# Patient Record
Sex: Female | Born: 1940 | Race: White | Hispanic: No | Smoking: Former smoker
Health system: Southern US, Community
[De-identification: ages and names within clinical notes are randomized; demographics above are authoritative.]

## PROBLEM LIST (undated history)

## (undated) DIAGNOSIS — I4891 Unspecified atrial fibrillation: Secondary | ICD-10-CM

## (undated) DIAGNOSIS — I10 Essential (primary) hypertension: Secondary | ICD-10-CM

## (undated) DIAGNOSIS — E039 Hypothyroidism, unspecified: Secondary | ICD-10-CM

## (undated) DIAGNOSIS — E079 Disorder of thyroid, unspecified: Secondary | ICD-10-CM

## (undated) DIAGNOSIS — I509 Heart failure, unspecified: Secondary | ICD-10-CM

## (undated) DIAGNOSIS — J449 Chronic obstructive pulmonary disease, unspecified: Secondary | ICD-10-CM

## (undated) DIAGNOSIS — K219 Gastro-esophageal reflux disease without esophagitis: Secondary | ICD-10-CM

## (undated) HISTORY — PX: BREAST CYST ASPIRATION: SHX578

---

## 2004-07-12 ENCOUNTER — Encounter: Admission: RE | Admit: 2004-07-12 | Discharge: 2004-07-12 | Payer: Self-pay | Admitting: Unknown Physician Specialty

## 2005-11-08 ENCOUNTER — Other Ambulatory Visit: Payer: Self-pay

## 2005-11-15 ENCOUNTER — Ambulatory Visit: Payer: Self-pay | Admitting: Unknown Physician Specialty

## 2006-10-08 ENCOUNTER — Ambulatory Visit: Payer: Self-pay | Admitting: Family Medicine

## 2008-09-14 ENCOUNTER — Ambulatory Visit: Payer: Self-pay | Admitting: Family Medicine

## 2009-09-18 ENCOUNTER — Ambulatory Visit: Payer: Self-pay | Admitting: Family Medicine

## 2010-10-02 ENCOUNTER — Ambulatory Visit: Payer: Self-pay | Admitting: Family Medicine

## 2011-10-31 ENCOUNTER — Ambulatory Visit: Payer: Self-pay | Admitting: Family Medicine

## 2012-04-15 ENCOUNTER — Ambulatory Visit: Payer: Self-pay | Admitting: Family Medicine

## 2012-05-07 ENCOUNTER — Ambulatory Visit: Payer: Self-pay | Admitting: Surgery

## 2012-05-14 ENCOUNTER — Ambulatory Visit: Payer: Self-pay | Admitting: Surgery

## 2012-05-15 LAB — PATHOLOGY REPORT

## 2012-11-12 ENCOUNTER — Emergency Department: Payer: Self-pay | Admitting: Internal Medicine

## 2012-11-12 LAB — CBC
MCH: 33.5 pg (ref 26.0–34.0)
MCHC: 35 g/dL (ref 32.0–36.0)
MCV: 96 fL (ref 80–100)
Platelet: 362 10*3/uL (ref 150–440)
RBC: 4.31 10*6/uL (ref 3.80–5.20)
RDW: 13.6 % (ref 11.5–14.5)

## 2012-11-12 LAB — COMPREHENSIVE METABOLIC PANEL
Alkaline Phosphatase: 96 U/L (ref 50–136)
Anion Gap: 5 — ABNORMAL LOW (ref 7–16)
Calcium, Total: 9.8 mg/dL (ref 8.5–10.1)
Co2: 31 mmol/L (ref 21–32)
EGFR (Non-African Amer.): >60
Osmolality: 269 (ref 275–301)
Sodium: 135 mmol/L — ABNORMAL LOW (ref 136–145)

## 2012-11-12 LAB — URINALYSIS, COMPLETE
Bilirubin,UR: NEGATIVE
Glucose,UR: NEGATIVE mg/dL (ref 0–75)
RBC,UR: 2 /HPF (ref 0–5)
Squamous Epithelial: 3
WBC UR: 3 /HPF (ref 0–5)

## 2012-11-12 LAB — LIPASE, BLOOD: Lipase: 131 U/L (ref 73–393)

## 2013-02-02 ENCOUNTER — Ambulatory Visit: Payer: Self-pay | Admitting: Family Medicine

## 2013-05-24 ENCOUNTER — Ambulatory Visit: Payer: Self-pay | Admitting: Gastroenterology

## 2013-07-07 ENCOUNTER — Ambulatory Visit: Payer: Self-pay | Admitting: Family Medicine

## 2013-07-09 ENCOUNTER — Ambulatory Visit: Payer: Self-pay | Admitting: Gastroenterology

## 2013-07-20 ENCOUNTER — Ambulatory Visit: Payer: Self-pay | Admitting: Gastroenterology

## 2013-07-22 LAB — PATHOLOGY REPORT

## 2014-04-20 ENCOUNTER — Ambulatory Visit: Payer: Self-pay | Admitting: Family Medicine

## 2014-06-15 IMAGING — US ABDOMEN ULTRASOUND
1 series · 14 of 25 positions shown · non-contrast
Comparison: none

REASON FOR EXAM: Epigastric abd pain Dyspepsia
COMMENTS:

[Series 1: abdomen ultrasound · 0.28mm/px · 14 of 106 slices shown]
[im 1/106]
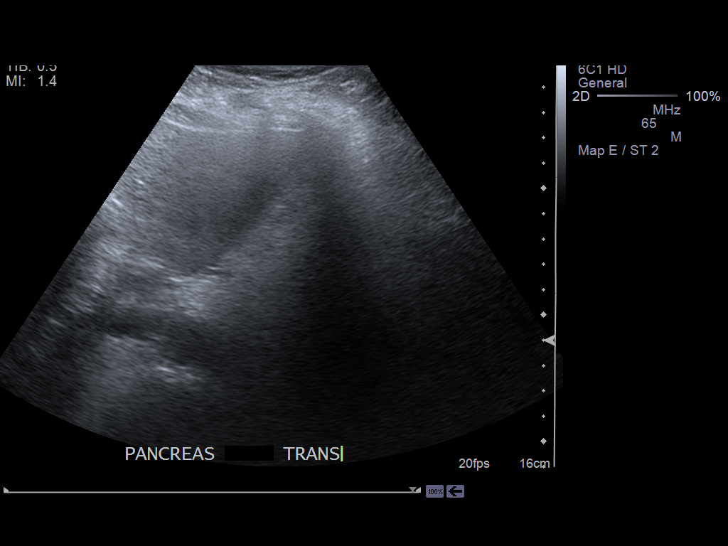
[im 9/106]
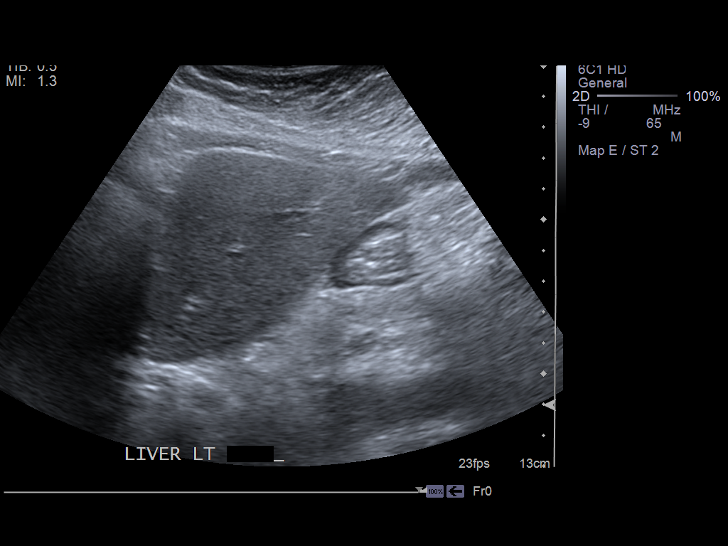
[im 18/106]
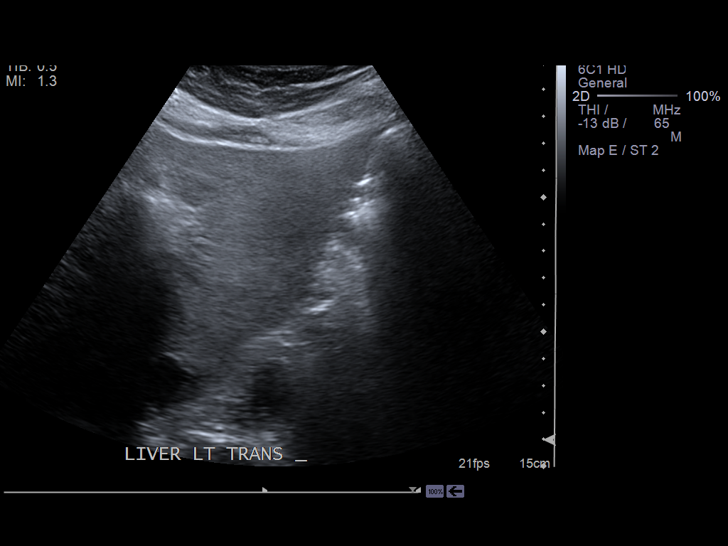
[im 27/106]
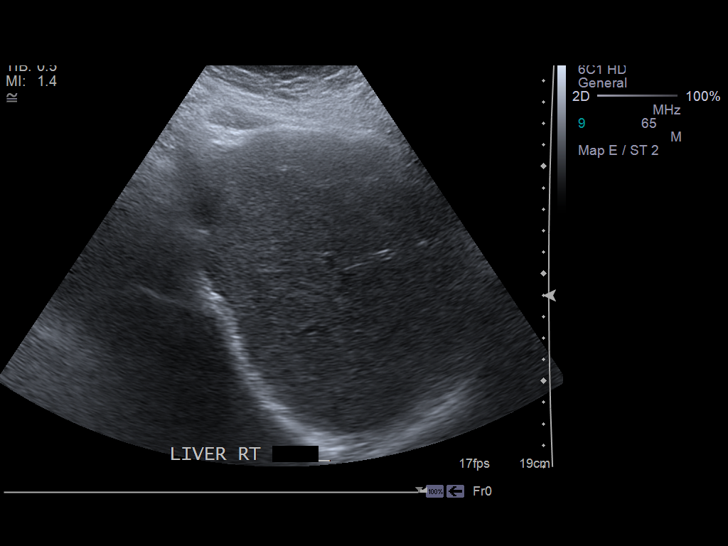
[im 36/106]
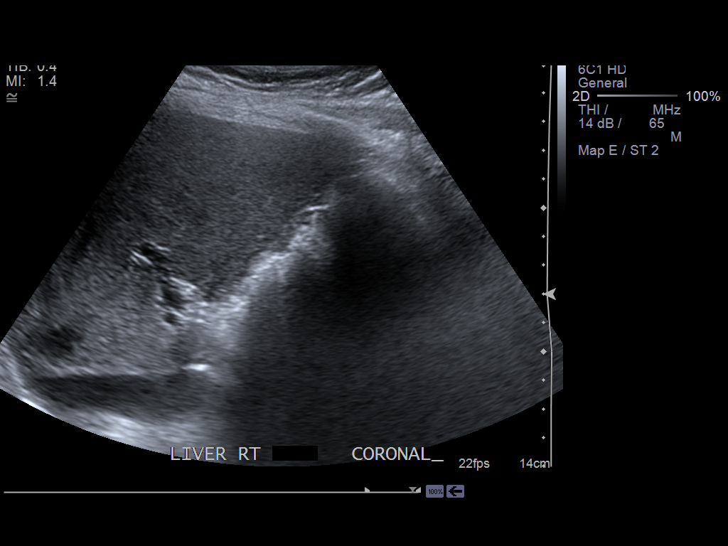
[im 40/106]
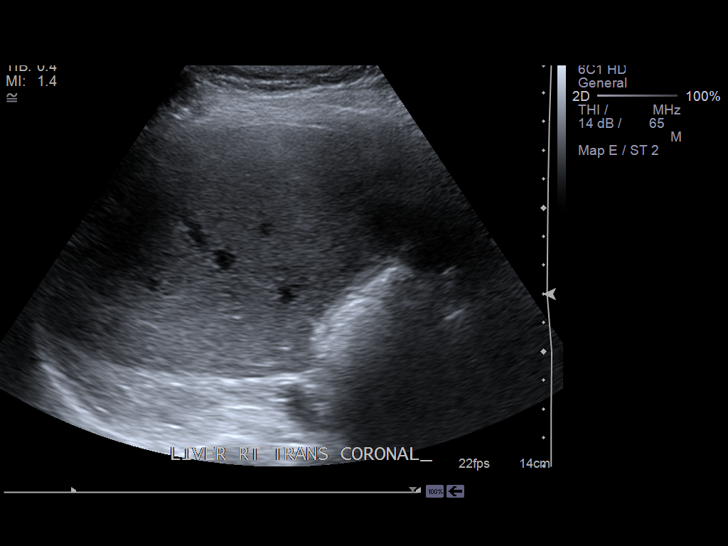
[im 49/106]
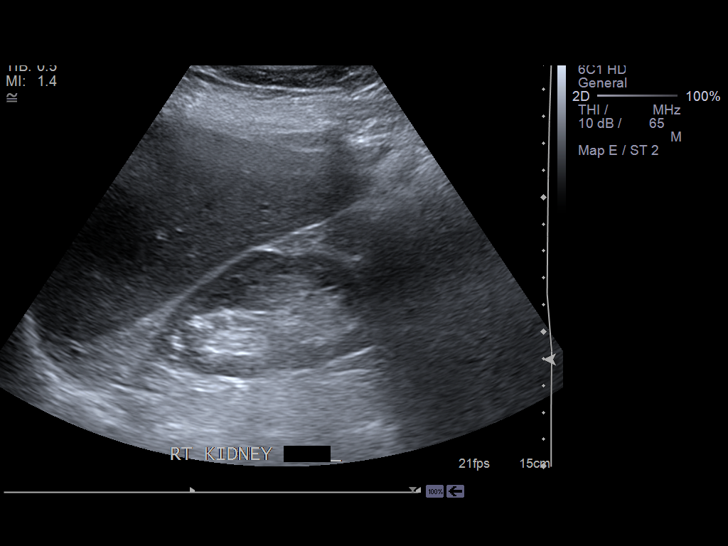
[im 57/106]
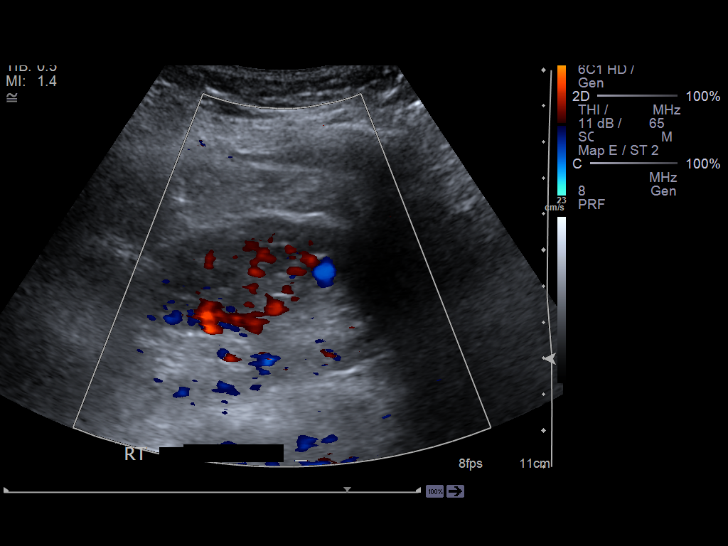
[im 66/106]
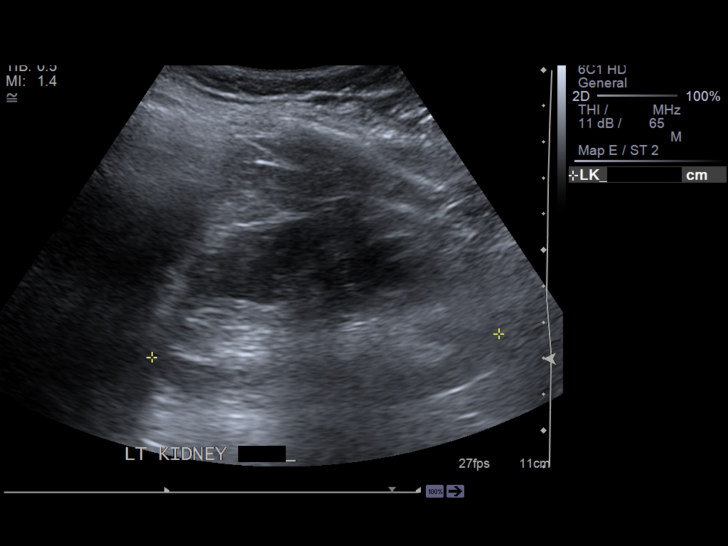
[im 71/106]
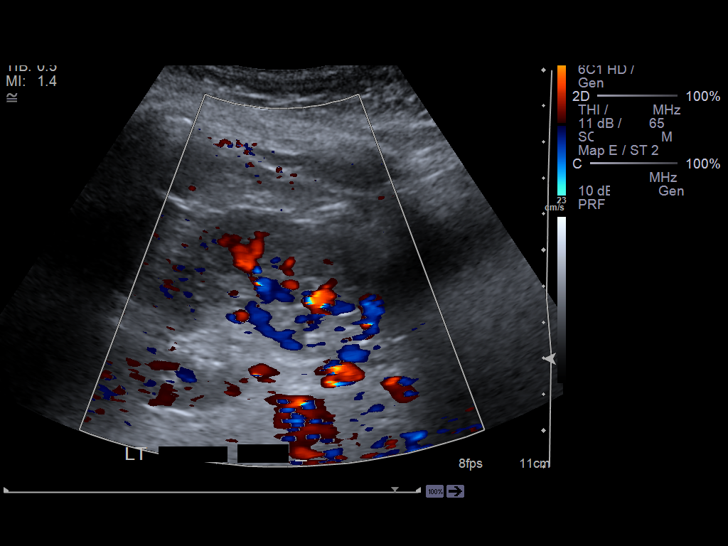
[im 79/106]
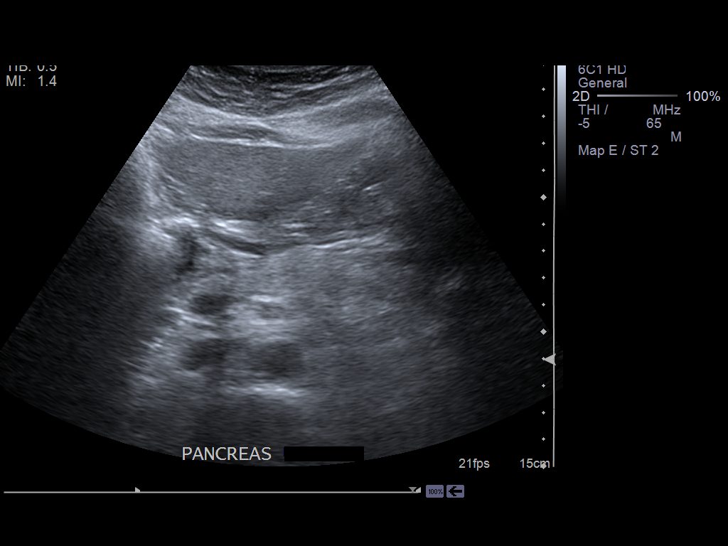
[im 88/106]
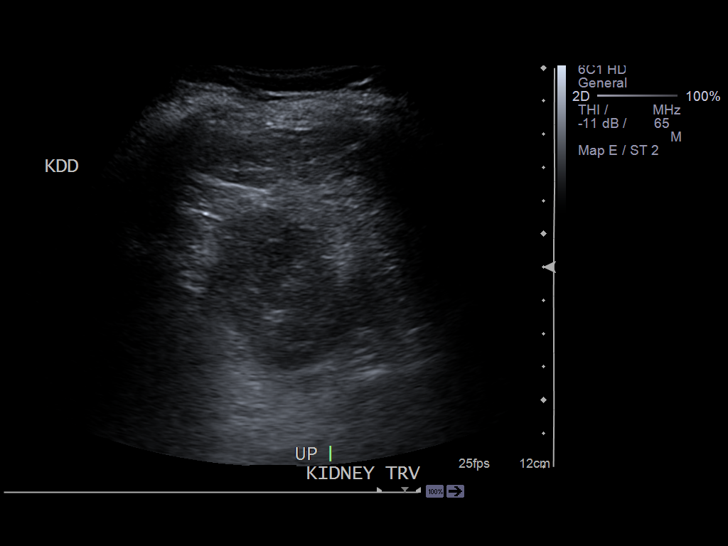
[im 97/106]
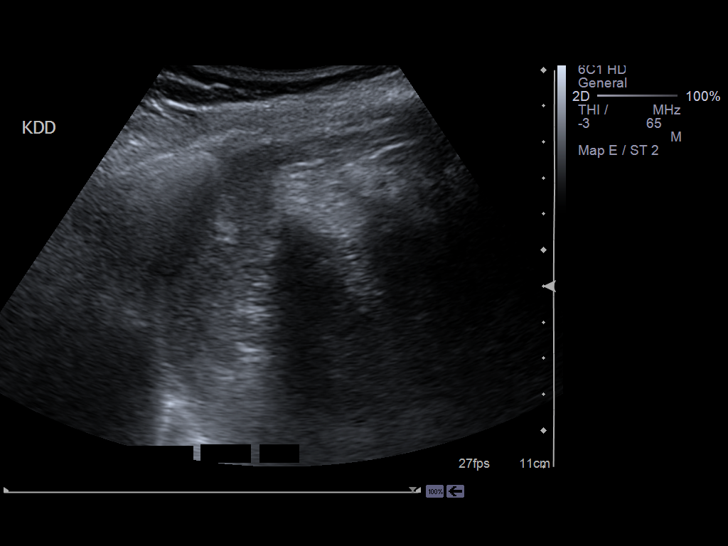
[im 106/106]
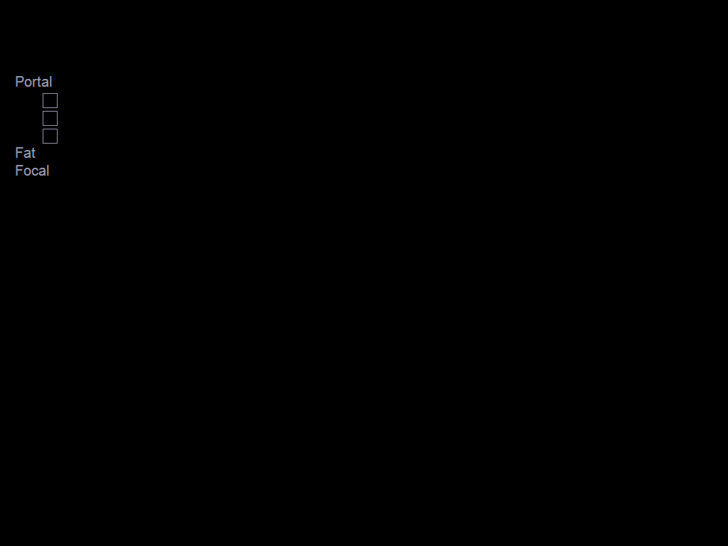

[14 of 25 positions shown; findings below may reference images not displayed]

PROCEDURE:     US  - US ABDOMEN GENERAL SURVEY  - July 09, 2013 [DATE]

RESULT:     The pancreas is poorly visualized because of overlying bowel
gas. The patient is status post cholecystectomy. There is limited
visualization of the pancreatic body which appears to be grossly normal. The
hepatic echotexture appears to be increased and slightly coarse consistent
with hepatic steatosis. The abdominal aorta tapers distally without evidence
of aneurysm. The liver length is 13.66 cm. No intrahepatic biliary ductal
dilation is evident. The common bile duct diameter is 5.3 mm. And portal
venous flow is normal. Spleen measures 7.36 cm with normal echotexture. The
right kidney measures 8.46 x 4.27 x 4.83 cm. The left kidney measures 9.66 x
4.71 x 4.60 cm. Neither kidney shows evidence of obstruction or solid or
cystic mass. No stones are evident.
IMPRESSION: 1. Status post cholecystectomy. Poor visualization of the pancreas. No
evidence of abdominal aortic aneurysm. Hepatic steatosis appears present.

[REDACTED]

## 2014-09-01 ENCOUNTER — Emergency Department: Payer: Self-pay | Admitting: Emergency Medicine

## 2014-09-01 LAB — DIFFERENTIAL
BASOS PCT: 1.2 %
Basophil #: 0.1 10*3/uL (ref 0.0–0.1)
EOS ABS: 0.1 10*3/uL (ref 0.0–0.7)
Eosinophil %: 0.9 %
LYMPHS ABS: 2.8 10*3/uL (ref 1.0–3.6)
LYMPHS PCT: 32.8 %
MONOS PCT: 7.7 %
Monocyte #: 0.7 x10 3/mm (ref 0.2–0.9)
NEUTROS PCT: 57.4 %
Neutrophil #: 4.9 10*3/uL (ref 1.4–6.5)

## 2014-09-01 LAB — BASIC METABOLIC PANEL
Anion Gap: 7 (ref 7–16)
BUN: 9 mg/dL (ref 7–18)
CALCIUM: 9.1 mg/dL (ref 8.5–10.1)
CHLORIDE: 99 mmol/L (ref 98–107)
CO2: 29 mmol/L (ref 21–32)
CREATININE: 0.91 mg/dL (ref 0.60–1.30)
GLUCOSE: 112 mg/dL — AB (ref 65–99)
OSMOLALITY: 270 (ref 275–301)
POTASSIUM: 3.2 mmol/L — AB (ref 3.5–5.1)
SODIUM: 135 mmol/L — AB (ref 136–145)

## 2014-09-01 LAB — CBC
HCT: 41.3 % (ref 35.0–47.0)
HGB: 13.3 g/dL (ref 12.0–16.0)
MCH: 31.7 pg (ref 26.0–34.0)
MCHC: 32.2 g/dL (ref 32.0–36.0)
MCV: 99 fL (ref 80–100)
Platelet: 476 10*3/uL — ABNORMAL HIGH (ref 150–440)
RBC: 4.19 10*6/uL (ref 3.80–5.20)
RDW: 14.1 % (ref 11.5–14.5)
WBC: 8.3 10*3/uL (ref 3.6–11.0)

## 2014-09-01 LAB — HEPATIC FUNCTION PANEL A (ARMC)
ALT: 20 U/L
AST: 26 U/L (ref 15–37)
Albumin: 4 g/dL (ref 3.4–5.0)
Alkaline Phosphatase: 88 U/L
BILIRUBIN TOTAL: 0.3 mg/dL (ref 0.2–1.0)
Total Protein: 8.1 g/dL (ref 6.4–8.2)

## 2014-09-01 LAB — LIPASE, BLOOD: LIPASE: 129 U/L (ref 73–393)

## 2014-09-01 LAB — TROPONIN I

## 2015-03-26 NOTE — Op Note (Signed)
PATIENT NAME:  Linda Brady, [name] MR#:  [id number] DATE OF BIRTH:  November 01, 1941  DATE OF PROCEDURE:  05/14/2012  PREOPERATIVE DIAGNOSIS: Chronic cholecystitis, cholelithiasis.   POSTOPERATIVE DIAGNOSIS: Cholecystitis, cholelithiasis.   PROCEDURE: Laparoscopic cholecystectomy, cholangiogram.   SURGEON: Renda RollsWilton Janne Faulk, M.D.   ANESTHESIA: General.   INDICATIONS: This 74 year old female has a history of epigastric pains and ultrasound findings of gallstones.   DESCRIPTION OF PROCEDURE:  The patient was placed on the operating table in the supine position under general endotracheal anesthesia. The abdomen was prepared with ChloraPrep and draped in a sterile manner. A short incision was made in the inferior aspect of the umbilicus and carried down to the deep fascia which was grasped with a laryngeal hook and elevated. A Veress needle was inserted, aspirated, and irrigated with a saline solution. Next, the peritoneal cavity was inflated with carbon dioxide. The Veress needle was removed. The 10 mm cannula was inserted. The 10 mm, 0 degree laparoscope was inserted to view the peritoneal cavity. Another incision was made in the epigastrium just to the right of the midline to introduce an 11 mm cannula. Two incisions were made in the lateral aspect of the right upper quadrant to introduce two 5-mm cannulas. The liver appeared normal. Gallbladder appeared to have a slightly thickened wall. There were some adhesions between the liver and the anterior abdominal wall which were taken down with sharp dissection. The gallbladder was retracted towards the right shoulder. The pouch of Jon BillingsMorrison was retracted inferiorly and laterally. The common bile duct was demonstrated. The neck of the gallbladder was mobilized with incision of the visceral peritoneum. The cystic artery was dissected free from surrounding structures. The cystic duct was dissected free from surrounding structures. A critical view of safety was  demonstrated. An Endo Clip was placed across the cystic duct adjacent to the neck of the gallbladder. An incision was made in the cystic duct to introduce a Reddick catheter. Half-strength Conray-60 dye was injected as the cholangiogram was done with fluoroscopy viewing the biliary tree and flow of dye into the duodenum. No retained stones were seen. Two images were saved. The Reddick catheter was removed. The cystic duct was doubly ligated with Endoclips and divided. The cystic artery was controlled with double Endoclips and divided. One other small branch of the cystic artery was controlled with a single Endoclip and divided. The gallbladder was dissected free from the liver with hook and cautery. The gallbladder was delivered up through the infraumbilical incision, opened and suctioned. Multiple stones were removed in a piecemeal fashion and the gallbladder was removed and submitted with stones in formalin for routine pathology. The right upper quadrant was further inspected. Hemostasis was intact. The cannulas were removed. Carbon dioxide was allowed to escape from the peritoneal cavity. Skin incisions were closed with interrupted 5-0 chromic subcuticular sutures, benzoin, and Steri-Strips. Dressings were applied with paper tape. The patient tolerated surgery satisfactorily and was then prepared for transfer to the recovery room.  ____________________________ Shela CommonsJ. Renda RollsWilton Nashaly Dorantes, MD jws:ap D: 05/14/2012 10:52:49 ET T: 05/14/2012 12:09:11 ET JOB#: [id number]  cc: Adella HareJ. Wilton Niccolo Burggraf, MD, <Dictator> Adella HareWILTON J Azariya Freeman MD ELECTRONICALLY SIGNED 05/15/2012 8:55

## 2015-05-23 ENCOUNTER — Other Ambulatory Visit: Payer: Self-pay | Admitting: Family Medicine

## 2015-05-23 DIAGNOSIS — Z1231 Encounter for screening mammogram for malignant neoplasm of breast: Secondary | ICD-10-CM

## 2015-05-31 ENCOUNTER — Ambulatory Visit: Payer: Self-pay | Attending: Family Medicine

## 2016-06-12 ENCOUNTER — Other Ambulatory Visit: Payer: Self-pay | Admitting: Family Medicine

## 2016-06-12 ENCOUNTER — Ambulatory Visit
Admission: RE | Admit: 2016-06-12 | Discharge: 2016-06-12 | Disposition: A | Discharge status: Home / Self Care | Payer: Medicare Other | Source: Ambulatory Visit | Attending: Family Medicine | Admitting: Family Medicine

## 2016-06-12 DIAGNOSIS — Z1231 Encounter for screening mammogram for malignant neoplasm of breast: Secondary | ICD-10-CM | POA: Diagnosis not present

## 2017-05-29 ENCOUNTER — Other Ambulatory Visit: Payer: Self-pay | Admitting: Unknown Physician Specialty

## 2017-05-29 DIAGNOSIS — R42 Dizziness and giddiness: Secondary | ICD-10-CM

## 2017-06-05 ENCOUNTER — Ambulatory Visit
Admission: RE | Admit: 2017-06-05 | Discharge: 2017-06-05 | Disposition: A | Discharge status: Home / Self Care | Payer: Medicare Other | Source: Ambulatory Visit | Attending: Unknown Physician Specialty | Admitting: Unknown Physician Specialty

## 2017-06-05 ENCOUNTER — Encounter: Payer: Self-pay | Admitting: Radiology

## 2017-06-05 DIAGNOSIS — R42 Dizziness and giddiness: Secondary | ICD-10-CM

## 2017-06-05 DIAGNOSIS — I6782 Cerebral ischemia: Secondary | ICD-10-CM | POA: Diagnosis not present

## 2017-06-05 LAB — POCT I-STAT CREATININE: Creatinine, Ser: 0.8 mg/dL (ref 0.44–1.00)

## 2017-06-05 MED ORDER — GADOBENATE DIMEGLUMINE 529 MG/ML IV SOLN
15.0000 mL | Freq: Once | INTRAVENOUS | Status: AC | PRN
  Administered 2017-06-05: 14 mL via INTRAVENOUS

## 2017-10-09 ENCOUNTER — Other Ambulatory Visit: Payer: Self-pay | Admitting: Family Medicine

## 2017-10-09 DIAGNOSIS — Z1231 Encounter for screening mammogram for malignant neoplasm of breast: Secondary | ICD-10-CM

## 2017-11-04 ENCOUNTER — Ambulatory Visit
Admission: RE | Admit: 2017-11-04 | Discharge: 2017-11-04 | Disposition: A | Discharge status: Home / Self Care | Payer: Medicare Other | Source: Ambulatory Visit | Attending: Family Medicine | Admitting: Family Medicine

## 2017-11-04 DIAGNOSIS — Z1231 Encounter for screening mammogram for malignant neoplasm of breast: Secondary | ICD-10-CM | POA: Diagnosis not present

## 2018-10-22 ENCOUNTER — Other Ambulatory Visit: Payer: Self-pay | Admitting: Family Medicine

## 2018-10-22 DIAGNOSIS — Z1231 Encounter for screening mammogram for malignant neoplasm of breast: Secondary | ICD-10-CM

## 2018-10-27 ENCOUNTER — Other Ambulatory Visit: Payer: Self-pay | Admitting: Family Medicine

## 2018-10-27 DIAGNOSIS — R109 Unspecified abdominal pain: Secondary | ICD-10-CM

## 2018-10-27 DIAGNOSIS — Z0000 Encounter for general adult medical examination without abnormal findings: Secondary | ICD-10-CM

## 2018-10-30 ENCOUNTER — Ambulatory Visit
Admission: RE | Admit: 2018-10-30 | Discharge: 2018-10-30 | Disposition: A | Discharge status: Home / Self Care | Payer: Medicare Other | Source: Ambulatory Visit | Attending: Family Medicine | Admitting: Family Medicine

## 2018-10-30 DIAGNOSIS — Z9049 Acquired absence of other specified parts of digestive tract: Secondary | ICD-10-CM | POA: Insufficient documentation

## 2018-10-30 DIAGNOSIS — Z0000 Encounter for general adult medical examination without abnormal findings: Secondary | ICD-10-CM | POA: Insufficient documentation

## 2018-10-30 DIAGNOSIS — R109 Unspecified abdominal pain: Secondary | ICD-10-CM | POA: Insufficient documentation

## 2018-10-30 DIAGNOSIS — K760 Fatty (change of) liver, not elsewhere classified: Secondary | ICD-10-CM | POA: Diagnosis not present

## 2018-11-17 ENCOUNTER — Ambulatory Visit
Admission: RE | Admit: 2018-11-17 | Discharge: 2018-11-17 | Disposition: A | Discharge status: Home / Self Care | Payer: Medicare Other | Source: Ambulatory Visit | Attending: Family Medicine | Admitting: Family Medicine

## 2018-11-17 DIAGNOSIS — Z1231 Encounter for screening mammogram for malignant neoplasm of breast: Secondary | ICD-10-CM | POA: Diagnosis not present

## 2020-03-20 ENCOUNTER — Other Ambulatory Visit: Payer: Self-pay | Admitting: Family Medicine

## 2020-03-20 DIAGNOSIS — Z1231 Encounter for screening mammogram for malignant neoplasm of breast: Secondary | ICD-10-CM

## 2020-03-27 ENCOUNTER — Encounter

## 2020-08-02 ENCOUNTER — Other Ambulatory Visit: Admission: RE | Admit: 2020-08-02 | Source: Ambulatory Visit

## 2020-08-04 ENCOUNTER — Encounter: Admission: RE | Payer: Self-pay | Source: Home / Self Care

## 2020-08-04 ENCOUNTER — Ambulatory Visit: Admission: RE | Admit: 2020-08-04 | Payer: Medicare Other | Source: Home / Self Care | Admitting: General Surgery

## 2020-08-04 SURGERY — COLONOSCOPY WITH PROPOFOL
Anesthesia: General

## 2020-09-10 ENCOUNTER — Emergency Department: Payer: Medicare Other

## 2020-09-10 ENCOUNTER — Observation Stay
Admission: EM | Admit: 2020-09-10 | Discharge: 2020-09-11 | Disposition: A | Discharge status: Home / Self Care | Payer: Medicare Other | Attending: Internal Medicine | Admitting: Internal Medicine

## 2020-09-10 ENCOUNTER — Encounter: Payer: Self-pay | Admitting: Emergency Medicine

## 2020-09-10 ENCOUNTER — Other Ambulatory Visit: Payer: Self-pay

## 2020-09-10 DIAGNOSIS — I200 Unstable angina: Secondary | ICD-10-CM | POA: Diagnosis not present

## 2020-09-10 DIAGNOSIS — I10 Essential (primary) hypertension: Secondary | ICD-10-CM | POA: Diagnosis present

## 2020-09-10 DIAGNOSIS — Z7982 Long term (current) use of aspirin: Secondary | ICD-10-CM | POA: Insufficient documentation

## 2020-09-10 DIAGNOSIS — R0789 Other chest pain: Secondary | ICD-10-CM | POA: Diagnosis present

## 2020-09-10 DIAGNOSIS — R079 Chest pain, unspecified: Secondary | ICD-10-CM

## 2020-09-10 DIAGNOSIS — Z79899 Other long term (current) drug therapy: Secondary | ICD-10-CM | POA: Diagnosis not present

## 2020-09-10 DIAGNOSIS — J449 Chronic obstructive pulmonary disease, unspecified: Secondary | ICD-10-CM | POA: Diagnosis not present

## 2020-09-10 DIAGNOSIS — E079 Disorder of thyroid, unspecified: Secondary | ICD-10-CM | POA: Diagnosis present

## 2020-09-10 DIAGNOSIS — J439 Emphysema, unspecified: Secondary | ICD-10-CM | POA: Diagnosis not present

## 2020-09-10 DIAGNOSIS — E039 Hypothyroidism, unspecified: Secondary | ICD-10-CM | POA: Diagnosis present

## 2020-09-10 DIAGNOSIS — Z20822 Contact with and (suspected) exposure to covid-19: Secondary | ICD-10-CM | POA: Diagnosis not present

## 2020-09-10 DIAGNOSIS — E876 Hypokalemia: Secondary | ICD-10-CM | POA: Diagnosis present

## 2020-09-10 HISTORY — DX: Essential (primary) hypertension: I10

## 2020-09-10 HISTORY — DX: Chronic obstructive pulmonary disease, unspecified: J44.9

## 2020-09-10 HISTORY — DX: Disorder of thyroid, unspecified: E07.9

## 2020-09-10 LAB — LIPID PANEL
Cholesterol: 158 mg/dL (ref 0–200)
HDL: 53 mg/dL (ref >40)
LDL Cholesterol: 85 mg/dL (ref 0–99)
Total CHOL/HDL Ratio: 3 RATIO
Triglycerides: 100 mg/dL (ref <150)
VLDL: 20 mg/dL (ref 0–40)

## 2020-09-10 LAB — CBC
HCT: 40.5 % (ref 36.0–46.0)
Hemoglobin: 13.7 g/dL (ref 12.0–15.0)
MCH: 31.8 pg (ref 26.0–34.0)
MCHC: 33.8 g/dL (ref 30.0–36.0)
MCV: 94 fL (ref 80.0–100.0)
Platelets: 422 10*3/uL — ABNORMAL HIGH (ref 150–400)
RBC: 4.31 MIL/uL (ref 3.87–5.11)
RDW: 12.7 % (ref 11.5–15.5)
WBC: 6 10*3/uL (ref 4.0–10.5)
nRBC: 0 % (ref 0.0–0.2)

## 2020-09-10 LAB — HEPATIC FUNCTION PANEL
ALT: 11 U/L (ref 0–44)
AST: 18 U/L (ref 15–41)
Albumin: 3.8 g/dL (ref 3.5–5.0)
Alkaline Phosphatase: 68 U/L (ref 38–126)
Bilirubin, Direct: <0.1 mg/dL (ref 0.0–0.2)
Total Bilirubin: 0.6 mg/dL (ref 0.3–1.2)
Total Protein: 6.8 g/dL (ref 6.5–8.1)

## 2020-09-10 LAB — PROTIME-INR
INR: 0.9 (ref 0.8–1.2)
Prothrombin Time: 11.8 seconds (ref 11.4–15.2)

## 2020-09-10 LAB — BASIC METABOLIC PANEL
Anion gap: 13 (ref 5–15)
BUN: 8 mg/dL (ref 8–23)
CO2: 26 mmol/L (ref 22–32)
Calcium: 9.6 mg/dL (ref 8.9–10.3)
Chloride: 93 mmol/L — ABNORMAL LOW (ref 98–111)
Creatinine, Ser: 0.82 mg/dL (ref 0.44–1.00)
GFR, Estimated: >60 mL/min (ref >60)
Glucose, Bld: 148 mg/dL — ABNORMAL HIGH (ref 70–99)
Potassium: 3.3 mmol/L — ABNORMAL LOW (ref 3.5–5.1)
Sodium: 132 mmol/L — ABNORMAL LOW (ref 135–145)

## 2020-09-10 LAB — TROPONIN I (HIGH SENSITIVITY)
Troponin I (High Sensitivity): 9 ng/L (ref <18)
Troponin I (High Sensitivity): 37 ng/L — ABNORMAL HIGH (ref <18)
Troponin I (High Sensitivity): 37 ng/L — ABNORMAL HIGH (ref <18)
Troponin I (High Sensitivity): 43 ng/L — ABNORMAL HIGH (ref <18)

## 2020-09-10 LAB — T4, FREE: Free T4: 1.15 ng/dL — ABNORMAL HIGH (ref 0.61–1.12)

## 2020-09-10 LAB — RESP PANEL BY RT PCR (RSV, FLU A&B, COVID)
Influenza A by PCR: NEGATIVE
Influenza B by PCR: NEGATIVE
Respiratory Syncytial Virus by PCR: NEGATIVE
SARS Coronavirus 2 by RT PCR: NEGATIVE

## 2020-09-10 LAB — APTT: aPTT: 30 seconds (ref 24–36)

## 2020-09-10 LAB — TSH: TSH: 1.72 u[IU]/mL (ref 0.350–4.500)

## 2020-09-10 MED ORDER — POTASSIUM CHLORIDE CRYS ER 20 MEQ PO TBCR
40.0000 meq | EXTENDED_RELEASE_TABLET | Freq: Once | ORAL | Status: DC
  Filled 2020-09-10: qty 2

## 2020-09-10 MED ORDER — POTASSIUM CHLORIDE 20 MEQ PO PACK
20.0000 meq | PACK | Freq: Once | ORAL | Status: AC
  Administered 2020-09-10: 20 meq via ORAL
  Filled 2020-09-10: qty 1

## 2020-09-10 MED ORDER — HEPARIN (PORCINE) 25000 UT/250ML-% IV SOLN
1100.0000 [IU]/h | INTRAVENOUS | Status: DC
  Administered 2020-09-10: 850 [IU]/h via INTRAVENOUS
  Filled 2020-09-10: qty 250

## 2020-09-10 MED ORDER — ENOXAPARIN SODIUM 40 MG/0.4ML ~~LOC~~ SOLN: 40.0000 mg | SUBCUTANEOUS | Status: DC

## 2020-09-10 MED ORDER — IPRATROPIUM-ALBUTEROL 0.5-2.5 (3) MG/3ML IN SOLN: 3.0000 mL | Freq: Four times a day (QID) | RESPIRATORY_TRACT | Status: DC | PRN

## 2020-09-10 MED ORDER — POTASSIUM CHLORIDE 20 MEQ PO PACK: 20.0000 meq | PACK | Freq: Two times a day (BID) | ORAL | Status: DC

## 2020-09-10 MED ORDER — NITROGLYCERIN 2 % TD OINT
0.5000 [in_us] | TOPICAL_OINTMENT | Freq: Four times a day (QID) | TRANSDERMAL | Status: DC
  Administered 2020-09-10 – 2020-09-11 (×2): 0.5 [in_us] via TOPICAL
  Filled 2020-09-10 (×2): qty 1

## 2020-09-10 MED ORDER — HEPARIN BOLUS VIA INFUSION
4000.0000 [IU] | Freq: Once | INTRAVENOUS | Status: AC
  Administered 2020-09-10: 4000 [IU] via INTRAVENOUS
  Filled 2020-09-10: qty 4000

## 2020-09-10 MED ORDER — IPRATROPIUM-ALBUTEROL 0.5-2.5 (3) MG/3ML IN SOLN
3.0000 mL | Freq: Once | RESPIRATORY_TRACT | Status: AC
  Administered 2020-09-10: 3 mL via RESPIRATORY_TRACT
  Filled 2020-09-10: qty 3

## 2020-09-10 MED ORDER — IOHEXOL 350 MG/ML SOLN
75.0000 mL | Freq: Once | INTRAVENOUS | Status: AC | PRN
  Administered 2020-09-10: 75 mL via INTRAVENOUS

## 2020-09-10 MED ORDER — CLOPIDOGREL BISULFATE 75 MG PO TABS
75.0000 mg | ORAL_TABLET | Freq: Every day | ORAL | Status: DC
  Administered 2020-09-10: 75 mg via ORAL
  Filled 2020-09-10: qty 1

## 2020-09-10 MED ORDER — ACETAMINOPHEN 325 MG PO TABS
650.0000 mg | ORAL_TABLET | ORAL | Status: DC | PRN
  Administered 2020-09-10 (×2): 650 mg via ORAL
  Filled 2020-09-10 (×2): qty 2

## 2020-09-10 MED ORDER — SODIUM CHLORIDE 0.9% FLUSH: 3.0000 mL | Freq: Two times a day (BID) | INTRAVENOUS | Status: DC

## 2020-09-10 MED ORDER — MOMETASONE FURO-FORMOTEROL FUM 100-5 MCG/ACT IN AERO
2.0000 | INHALATION_SPRAY | Freq: Two times a day (BID) | RESPIRATORY_TRACT | Status: DC
  Administered 2020-09-10 – 2020-09-11 (×2): 2 via RESPIRATORY_TRACT
  Filled 2020-09-10: qty 8.8

## 2020-09-10 MED ORDER — ATORVASTATIN CALCIUM 20 MG PO TABS
40.0000 mg | ORAL_TABLET | Freq: Every day | ORAL | Status: DC
  Administered 2020-09-10: 40 mg via ORAL
  Filled 2020-09-10: qty 2

## 2020-09-10 MED ORDER — ONDANSETRON HCL 4 MG/2ML IJ SOLN
4.0000 mg | Freq: Four times a day (QID) | INTRAMUSCULAR | Status: DC | PRN
  Administered 2020-09-11: 4 mg via INTRAVENOUS
  Filled 2020-09-10: qty 2

## 2020-09-10 MED ORDER — METOPROLOL TARTRATE 25 MG PO TABS
25.0000 mg | ORAL_TABLET | Freq: Two times a day (BID) | ORAL | Status: DC
  Administered 2020-09-10: 25 mg via ORAL
  Filled 2020-09-10: qty 1

## 2020-09-10 MED ORDER — TIOTROPIUM BROMIDE MONOHYDRATE 18 MCG IN CAPS: 1.0000 | ORAL_CAPSULE | Freq: Every day | RESPIRATORY_TRACT | Status: DC

## 2020-09-10 MED ORDER — FENTANYL CITRATE (PF) 100 MCG/2ML IJ SOLN
25.0000 ug | Freq: Once | INTRAMUSCULAR | Status: AC
  Administered 2020-09-10: 25 ug via INTRAVENOUS
  Filled 2020-09-10: qty 2

## 2020-09-10 MED ORDER — DICYCLOMINE HCL 10 MG PO CAPS
10.0000 mg | ORAL_CAPSULE | Freq: Three times a day (TID) | ORAL | Status: DC
  Administered 2020-09-10 (×2): 10 mg via ORAL
  Filled 2020-09-10 (×2): qty 1

## 2020-09-10 NOTE — ED Notes (Signed)
EDP at bedside  

## 2020-09-10 NOTE — ED Notes (Addendum)
Error: charted on wrong pt 

## 2020-09-10 NOTE — ED Notes (Addendum)
Pt c/o SOB. Takes Spiriva at home. SPO2 96% on RA. Wheezing noted all lung fields. HOB moved to almost 90 degrees.

## 2020-09-10 NOTE — ED Notes (Signed)
Pt states having a headache and neck pain with nausea. Pt declined pharmacologic interventions. Pt provided with ice water, saltine crackers and ice packs, as she wants to try this first.

## 2020-09-10 NOTE — ED Notes (Signed)
Pt receiving neb treatment. Son at bedside.

## 2020-09-10 NOTE — ED Notes (Signed)
Pt given water and EKG repeated per verbal from Dr. Fanny Bien

## 2020-09-10 NOTE — ED Notes (Signed)
Pt received neb treatment for SOB. Pt now has unlabored respirations with faint expiratory wheezing noted on auscultation. Put on 2L O2 per [place] to help ease of breathing. SPO2 98%.

## 2020-09-10 NOTE — ED Notes (Signed)
Message sent to attending MD and informed her that pt is c/o acutely worsening shortness of breath and that pt has wheezing in all lobes. Pt SpO2 sats are stable at 96-98% on room air. Order given for duoneb treatment x 1 and then place pt on 2 liters of O2 via [place].

## 2020-09-10 NOTE — ED Notes (Signed)
Standby assist while pt ambulated to bathroom, steady gait noted

## 2020-09-10 NOTE — ED Notes (Signed)
This nurse attempted IV placement one time (L wrist), then other nurse attempted IV placement one time (L forearm)  in order for pt to have 2 IV lines while receiving ordered heparin infusion. Charge nurse called to come attempt IV line placement.

## 2020-09-10 NOTE — ED Notes (Signed)
Spoke with Tonya in the lab, she is going to pull tubes to PT/INR and aPTT, will add on lipid test as well.

## 2020-09-10 NOTE — ED Notes (Signed)
Lab called, they need repeat draw of PTT/PT. Will redraw.

## 2020-09-10 NOTE — Consult Note (Signed)
[facility] Cardiology  CARDIOLOGY CONSULT NOTE  Patient ID: Linda Brady MRN: [id number] DOB/AGE: 1940/12/30 79 y.o.  Admit date: 09/10/2020 Referring Physician Agbata  Primary Physician [facility] Primary Cardiologist  Reason for Consultation unstable angina  HPI: 79 year old female referred for evaluation of unstable angina.  Patient has a history of COPD with ongoing tobacco abuse, essential hypertension, and paroxysmal atrial fibrillation not on chronic anticoagulation.  Patient was in usual state of health until this morning when she experienced substernal chest pain, rated 7 out of 10.  She presented to [facility] emergency room via EMS.  Initial ECG by EMS showed atrial fibrillation at a rate 145 bpm.  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  Admission labs notable for initial troponin of 9, followed by 37 and 37.  Patient reports feeling better with resolution of chest pain.  Chest CT ruled out pulmonary embolus.  Review of systems complete and found to be negative unless listed above     Past Medical History:  Diagnosis Date  . COPD (chronic obstructive pulmonary disease) (HCC)   . Hypertension   . Thyroid disease     Past Surgical History:  Procedure Laterality Date  . BREAST CYST ASPIRATION Left 10 plus yrs   benign-twice    (Not in a hospital admission)  Social History   Socioeconomic History  . Marital status: Widowed    Spouse name: Not on file  . Number of children: Not on file  . Years of education: Not on file  . Highest education level: Not on file  Occupational History  . Not on file  Tobacco Use  . Smoking status: Never Smoker  . Smokeless tobacco: Never Used  Substance and Sexual Activity  . Alcohol use: Not Currently  . Drug use: Not Currently  . Sexual activity: Not on file  Other Topics Concern  . Not on file  Social History Narrative  . Not on file   Social Determinants of Health    Financial Resource Strain:   . Difficulty of Paying Living Expenses: Not on file  Food Insecurity:   . Worried About Programme researcher, broadcasting/film/video in the Last Year: Not on file  . Ran Out of Food in the Last Year: Not on file  Transportation Needs:   . Lack of Transportation (Medical): Not on file  . Lack of Transportation (Non-Medical): Not on file  Physical Activity:   . Days of Exercise per Week: Not on file  . Minutes of Exercise per Session: Not on file  Stress:   . Feeling of Stress : Not on file  Social Connections:   . Frequency of Communication with Friends and Family: Not on file  . Frequency of Social Gatherings with Friends and Family: Not on file  . Attends Religious Services: Not on file  . Active Member of Clubs or Organizations: Not on file  . Attends Banker Meetings: Not on file  . Marital Status: Not on file  Intimate Partner Violence:   . Fear of Current or Ex-Partner: Not on file  . Emotionally Abused: Not on file  . Physically Abused: Not on file  . Sexually Abused: Not on file    Family History  Problem Relation Age of Onset  . Breast cancer Neg Hx       Review of systems complete and found to be negative unless listed above      PHYSICAL EXAM  General: Well developed, well  nourished, in no acute distress HEENT:  Normocephalic and atramatic Neck:  No JVD.  Lungs: Clear bilaterally to auscultation and percussion. Heart: HRRR . Normal S1 and S2 without gallops or murmurs.  Abdomen: Bowel sounds are positive, abdomen soft and non-tender  Msk:  Back normal, normal gait. Normal strength and tone for age. Extremities: No clubbing, cyanosis or edema.   Neuro: Alert and oriented X 3. Psych:  Good affect, responds appropriately  Labs:   Lab Results  Component Value Date   WBC 6.0 09/10/2020   HGB 13.7 09/10/2020   HCT 40.5 09/10/2020   MCV 94.0 09/10/2020   PLT 422 (H) 09/10/2020    Recent Labs  Lab 09/10/20 1302 09/10/20 1452  NA  132*  --   K 3.3*  --   CL 93*  --   CO2 26  --   BUN 8  --   CREATININE 0.82  --   CALCIUM 9.6  --   PROT  --  6.8  BILITOT  --  0.6  ALKPHOS  --  68  ALT  --  11  AST  --  18  GLUCOSE 148*  --    Lab Results  Component Value Date   TROPONINI < 0.02 09/01/2014    Lab Results  Component Value Date   CHOL 158 09/10/2020   Lab Results  Component Value Date   HDL 53 09/10/2020   Lab Results  Component Value Date   LDLCALC 85 09/10/2020   Lab Results  Component Value Date   TRIG 100 09/10/2020   Lab Results  Component Value Date   CHOLHDL 3.0 09/10/2020   No results found for: LDLDIRECT    Radiology: CT Angio Chest PE W and/or Wo Contrast  Result Date: 09/10/2020 CLINICAL DATA:  79 year old female with positive D-dimer. Concern for pulmonary embolism. EXAM: CT ANGIOGRAPHY CHEST WITH CONTRAST TECHNIQUE: Multidetector CT imaging of the chest was performed using the standard protocol during bolus administration of intravenous contrast. Multiplanar CT image reconstructions and MIPs were obtained to evaluate the vascular anatomy. CONTRAST:  71mL OMNIPAQUE IOHEXOL 350 MG/ML SOLN COMPARISON:  Chest radiograph dated 09/10/2020. FINDINGS: Cardiovascular: There is no cardiomegaly or pericardial effusion. Coronary vascular calcification of the LAD. There is mild atherosclerotic calcification of the thoracic aorta. No aneurysmal dilatation or dissection. No pulmonary artery embolus identified. Mediastinum/Nodes: There is no hilar or mediastinal adenopathy. The esophagus is grossly unremarkable. No mediastinal fluid collection. Lungs/Pleura: Linear scarring in the right middle lobe and lingula. No focal consolidation, pleural effusion, or pneumothorax. There is a 3 mm left lower lobe subpleural nodule (65/12). The central airways are patent. Upper Abdomen: No acute abnormality. Musculoskeletal: No chest wall abnormality. No acute or significant osseous findings. Review of the MIP images  confirms the above findings. IMPRESSION: No acute intrathoracic pathology. No CT evidence of pulmonary embolism. Electronically Signed   By: Elgie Collard M.D.   On: 09/10/2020 15:31   DG Chest Port 1 View  Result Date: 09/10/2020 CLINICAL DATA:  Midsternal chest pain. EXAM: PORTABLE CHEST 1 VIEW COMPARISON:  September 01, 2014 FINDINGS: The heart size and mediastinal contours are within normal limits. Both lungs are clear. The visualized skeletal structures are unremarkable. IMPRESSION: No active disease. Electronically Signed   By: Sherian Rein M.D.   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  Paroxysmal atrial fibrillation, brief in nature, not on chronic anticoagulation, chads vas score of 4, currently in sinus rhythm 3.  COPD, with ongoing tobacco abuse 4.  Essential hypertension, on enalapril and HCTZ 5.  Hyperlipidemia, on simvastatin  Recommendations  1.  Agree with current therapy 2.  Continue heparin drip 3.  Cardiac catheterization with selective coronary arteriography scheduled for the a.m.  The risk, benefits and alternatives of cardiac catheterization and possible PCI were explained to the patient and informed written consent was obtained. 4.  Defer chronic anticoagulation for atrial fibrillation at this time, will need to be addressed prior to discharge or as outpatient  Signed: Marcina Millard MD,PhD, [facility] 09/10/2020, 6:48 PM

## 2020-09-10 NOTE — ED Provider Notes (Signed)
[facility] Emergency Department Provider Note   ____________________________________________   First MD Initiated Contact with Patient 09/10/20 1309     (approximate)  I have reviewed the triage vital signs and the nursing notes.   HISTORY  Chief Complaint Chest Pain    HPI Linda Brady is a 79 y.o. female history of COPD hypertension thyroid disease  Patient reports 30 minutes before she called 911 she suddenly began having chest pain just the left of the chest that radiates slightly towards her back along with shortness of breath and feeling of a racing heart.  The pain was severe, improved to about a 7 out of 10 with aspirin and nitroglycerin were given by EMS.  However this has caused her to have a bit of a headache which she reports EMS warned her of.  Still having about 7 out of 10 left-sided chest pain.  No fevers or chills.  No nausea or vomiting.  In normal health until this suddenly occurred while seated on her couch   No leg swelling.  No history of heart disease.  Reports this is never happened her before  Past Medical History:  Diagnosis Date  . COPD (chronic obstructive pulmonary disease) (HCC)   . Hypertension   . Thyroid disease     There are no problems to display for this patient.   Past Surgical History:  Procedure Laterality Date  . BREAST CYST ASPIRATION Left 10 plus yrs   benign-twice    Prior to Admission medications   Not on File    Allergies Naproxen sodium  Family History  Problem Relation Age of Onset  . Breast cancer Neg Hx     Social History Social History   Tobacco Use  . Smoking status: Never Smoker  . Smokeless tobacco: Never Used  Substance Use Topics  . Alcohol use: Not Currently  . Drug use: Not Currently    Review of Systems Constitutional: No fever/chills Eyes: No visual changes. ENT: No sore throat. Cardiovascular: See HPI Respiratory: See HPI Gastrointestinal: No abdominal  pain.   Genitourinary: Negative for dysuria. Musculoskeletal: Negative for back pain. Skin: Negative for rash. Neurological: Negative for headaches, areas of focal weakness or numbness.    ____________________________________________   PHYSICAL EXAM:  VITAL SIGNS: ED Triage Vitals  Enc Vitals Group     BP 09/10/20 1302 (!) 158/80     Pulse Rate 09/10/20 1302 (!) 110     Resp 09/10/20 1302 (!) 23     Temp 09/10/20 1302 98.1 F (36.7 C)     Temp Source 09/10/20 1302 Oral     SpO2 09/10/20 1257 92 %     Weight 09/10/20 1303 170 lb (77.1 kg)     Height 09/10/20 1303 5\' 4"  (1.626 m)     Head Circumference --      Peak Flow --      Pain Score 09/10/20 1303 7     Pain Loc --      Pain Edu? --      Excl. in GC? --     Constitutional: Alert and oriented.  Overall well-appearing but appears anxious Eyes: Conjunctivae are normal. Head: Atraumatic. Nose: No congestion/rhinnorhea. Mouth/Throat: Mucous membranes are moist. Neck: No stridor.  Cardiovascular: Moderately tachycardic rate, regular rhythm. Grossly normal heart sounds.  Good peripheral circulation. Respiratory: Very mild tachypnea but normal respiratory effort and clear lung sounds.  Speaks in clear sentences.  No oxygen requirement at this time Gastrointestinal: Soft and  nontender. No distention. Musculoskeletal: No lower extremity tenderness nor edema. Neurologic:  Normal speech and language. No gross focal neurologic deficits are appreciated.  Skin:  Skin is warm, dry and intact. No rash noted. Psychiatric: Mood and affect are normal to slightly anxious. Speech and behavior are normal.  ____________________________________________   LABS (all labs ordered are listed, but only abnormal results are displayed)  Labs Reviewed  BASIC METABOLIC PANEL - Abnormal; Notable for the following components:      Result Value   Sodium 132 (*)    Potassium 3.3 (*)    Chloride 93 (*)    Glucose, Bld 148 (*)    All other  components within normal limits  CBC - Abnormal; Notable for the following components:   Platelets 422 (*)    All other components within normal limits  TROPONIN I (HIGH SENSITIVITY) - Abnormal; Notable for the following components:   Troponin I (High Sensitivity) 37 (*)    All other components within normal limits  RESP PANEL BY RT PCR (RSV, FLU A&B, COVID)  APTT  HEPATIC FUNCTION PANEL  T4, FREE  TSH  PROTIME-INR  TROPONIN I (HIGH SENSITIVITY)   ____________________________________________  EKG  Reviewed interpreted at 1302 Heart rate 110 QRS 80 QTc 450 Sinus tachycardia, mild somewhat diffuse ST abnormality is seen including depressions in multiple leads.  Concern for potential cardiac ischemia, but none does not appear to fit a picture consistent with STEMI at this time  Repeat EKG reviewed at 1510 Heart rate 95 QRS 99 QTc 440 Normal sinus rhythm, mild but again persistent slight ST segment depression seen primarily over the left precordial leads. ____________________________________________  RADIOLOGY  CT Angio Chest PE W and/or Wo Contrast  Result Date: 09/10/2020 CLINICAL DATA:  79 year old female with positive D-dimer. Concern for pulmonary embolism. EXAM: CT ANGIOGRAPHY CHEST WITH CONTRAST TECHNIQUE: Multidetector CT imaging of the chest was performed using the standard protocol during bolus administration of intravenous contrast. Multiplanar CT image reconstructions and MIPs were obtained to evaluate the vascular anatomy. CONTRAST:  32mL OMNIPAQUE IOHEXOL 350 MG/ML SOLN COMPARISON:  Chest radiograph dated 09/10/2020. FINDINGS: Cardiovascular: There is no cardiomegaly or pericardial effusion. Coronary vascular calcification of the LAD. There is mild atherosclerotic calcification of the thoracic aorta. No aneurysmal dilatation or dissection. No pulmonary artery embolus identified. Mediastinum/Nodes: There is no hilar or mediastinal adenopathy. The esophagus is grossly  unremarkable. No mediastinal fluid collection. Lungs/Pleura: Linear scarring in the right middle lobe and lingula. No focal consolidation, pleural effusion, or pneumothorax. There is a 3 mm left lower lobe subpleural nodule (65/12). The central airways are patent. Upper Abdomen: No acute abnormality. Musculoskeletal: No chest wall abnormality. No acute or significant osseous findings. Review of the MIP images confirms the above findings. IMPRESSION: No acute intrathoracic pathology. No CT evidence of pulmonary embolism. Electronically Signed   By: Elgie Collard M.D.   On: 09/10/2020 15:31   DG Chest Port 1 View  Result Date: 09/10/2020 CLINICAL DATA:  Midsternal chest pain. EXAM: PORTABLE CHEST 1 VIEW COMPARISON:  September 01, 2014 FINDINGS: The heart size and mediastinal contours are within normal limits. Both lungs are clear. The visualized skeletal structures are unremarkable. IMPRESSION: No active disease. Electronically Signed   By: Sherian Rein M.D.   On: 09/10/2020 14:19      Chest x-ray personally viewed by me, no acute findings noted  I personally reviewed the patient's CT images, await final read from radiologist, however I do not see evidence of acute  pulmonary embolism on my read.   ____________________________________________   PROCEDURES  Procedure(s) performed: None  Procedures  Critical Care performed: Yes, see critical care note(s)  CRITICAL CARE Performed by: Sharyn Creamer   Total critical care time: 30 minutes  Critical care time was exclusive of separately billable procedures and treating other patients.  Critical care was necessary to treat or prevent imminent or life-threatening deterioration.  Critical care was time spent personally by me on the following activities: development of treatment plan with patient and/or surrogate as well as nursing, discussions with consultants, evaluation of patient's response to treatment, examination of patient, obtaining  history from patient or surrogate, ordering and performing treatments and interventions, ordering and review of laboratory studies, ordering and review of radiographic studies, pulse oximetry and re-evaluation of patient's condition.  ____________________________________________   INITIAL IMPRESSION / ASSESSMENT AND PLAN / ED COURSE  Pertinent labs & imaging results that were available during my care of the patient were reviewed by me and considered in my medical decision making (see chart for details).   Differential diagnosis includes, but is not limited to, ACS, aortic dissection, pulmonary embolism, cardiac tamponade, pneumothorax, pneumonia, pericarditis, myocarditis, GI-related causes including esophagitis/gastritis, and musculoskeletal chest wall pain.    No associated abdominal symptoms.  Reassuring peripheral vascular examination of the extremities.  Normal perfusion all extremities.  No abdominal symptoms.  No head neck or neurologic symptoms.  Linda Brady was evaluated in Emergency Department on 09/10/2020 for the symptoms described in the history of present illness. She was evaluated in the context of the global COVID-19 pandemic, which necessitated consideration that the patient might be at risk for infection with the SARS-CoV-2 virus that causes COVID-19. Institutional protocols and algorithms that pertain to the evaluation of patients at risk for COVID-19 are in a state of rapid change based on information released by regulatory bodies including the CDC and federal and state organizations. These policies and algorithms were followed during the patient's care in the ED.  ----------------------------------------- 3:10 PM on 09/10/2020 -----------------------------------------  Reassessment, patient resting comfortably reports pain is very minimal if any across the upper chest.  Awaiting CT imaging.  Discussed with patient and her son who is now at bedside, recommendation for  admission for further evaluation of her episode of chest pain today.  Patient and son understanding agreeable with plan.  Hospitalist paged  Admission discussed with hospitalist Dr. Joylene Igo     ____________________________________________   FINAL CLINICAL IMPRESSION(S) / ED DIAGNOSES  Final diagnoses:  Chest pain, moderate coronary artery risk        Note:  This document was prepared using Dragon voice recognition software and may include unintentional dictation errors       Sharyn Creamer, MD 09/10/20 1540

## 2020-09-10 NOTE — ED Notes (Signed)
Son at bedside.

## 2020-09-10 NOTE — H&P (Addendum)
History and Physical    Linda Brady DOB: 1941/07/28 DOA: 09/10/2020  PCP: [name], MD   Patient coming from: Home  I have personally briefly reviewed patient's old medical records in [facility]  Chief Complaint:   HPI: Linda Brady is a 79 y.o. female with medical history significant for nicotine dependence, COPD, hypertension who presents to the emergency room for evaluation of chest pain that started this morning about 45 hours prior to her presentation.  Chest pain is mostly over the left anterior chest wall, started at rest and is associated with pain in her neck and back (chronic pain).  She rates her pain a 7 x 10 in intensity at its worst.  She has some nausea and shortness of breath but denies having any emesis, no dizziness, no lightheadedness or diaphoresis.  She was noted to be tachycardic by EMS and was given aspirin 324 mg and a spray of nitroglycerin with some improvement in her pain but not complete resolution. She complains of a headache which I attribute to the nitroglycerin that she received but denies having any fever or chills, no changes in her bowel habits, no cough, no urinary symptoms. Labs show sodium of 132, potassium of 3.3, chloride of 93, bicarb of 26, BUN of 8, creatinine of 0.82, calcium 9.6, alkaline phosphatase 68, albumin 3.8, AST 18, ALT 11 total protein 6.8,  Troponin 9 >> 37, white count 6.0, hemoglobin 13.7, hematocrit 40.5, MCV 94, RDW 12.7, platelet count 422, TSH 1.7 CT angiogram shows no acute intrathoracic pathology.  No CT evidence of pulmonary embolism. Chest x-ray reviewed by me shows no acute pulmonary disease Twelve-lead EKG reviewed by me shows sinus tachycardia with an old anterior infarct   ED Course: Patient is a 79 year old female with a history of nicotine dependence and COPD who presents to the ER via EMS for evaluation of left-sided chest pain associated with nausea and shortness of breath.  She has a  bump in her troponin and continues to have chest pain.  She has no acute ST-T wave changes. She will be admitted to the hospital for further evaluation     Review of Systems: As per HPI otherwise 10 point review of systems negative.    Past Medical History:  Diagnosis Date  . COPD (chronic obstructive pulmonary disease) (HCC)   . Hypertension   . Thyroid disease     Past Surgical History:  Procedure Laterality Date  . BREAST CYST ASPIRATION Left 10 plus yrs   benign-twice     reports that she has never smoked. She has never used smokeless tobacco. She reports previous alcohol use. She reports previous drug use.  Allergies  Allergen Reactions  . Naproxen Sodium Anaphylaxis    Family History  Problem Relation Age of Onset  . Breast cancer Neg Hx      Prior to Admission medications   Not on File    Physical Exam: Vitals:   09/10/20 1302 09/10/20 1303 09/10/20 1400 09/10/20 1500  BP: (!) 158/80  (!) 151/75 (!) 163/71  Pulse: (!) 110  90 84  Resp: (!) 23  18 19   Temp: 98.1 F (36.7 C)     TempSrc: Oral     SpO2: 95%  93% 96%  Weight:  77.1 kg    Height:  5\' 4"  (1.626 m)       Vitals:   09/10/20 1302 09/10/20 1303 09/10/20 1400 09/10/20 1500  BP: (!) 158/80  (!) 151/75 11/10/20)  163/71  Pulse: (!) 110  90 84  Resp: (!) 23  18 19   Temp: 98.1 F (36.7 C)     TempSrc: Oral     SpO2: 95%  93% 96%  Weight:  77.1 kg    Height:  5\' 4"  (1.626 m)      Constitutional: NAD, alert and oriented x 3.  Appears uncomfortable Eyes: PERRL, lids and conjunctivae normal ENMT: Mucous membranes are moist.  Neck: normal, supple, no masses, no thyromegaly Respiratory: clear to auscultation bilaterally, no wheezing, no crackles. Normal respiratory effort. No accessory muscle use.  Cardiovascular: Tachycardia, no murmurs / rubs / gallops. No extremity edema. 2+ pedal pulses. No carotid bruits.  Chest pain is nonreproducible Abdomen: no tenderness, no masses palpated. No  hepatosplenomegaly. Bowel sounds positive.  Musculoskeletal: no clubbing / cyanosis. No joint deformity upper and lower extremities.  Skin: no rashes, lesions, ulcers.  Neurologic: No gross focal neurologic deficit. Psychiatric: Normal mood and affect.   Labs on Admission: I have personally reviewed following labs and imaging studies  CBC: Recent Labs  Lab 09/10/20 1302  WBC 6.0  HGB 13.7  HCT 40.5  MCV 94.0  PLT 422*   Basic Metabolic Panel: Recent Labs  Lab 09/10/20 1302  NA 132*  K 3.3*  CL 93*  CO2 26  GLUCOSE 148*  BUN 8  CREATININE 0.82  CALCIUM 9.6   GFR: Estimated Creatinine Clearance: 58.7 mL/min (by C-G formula based on SCr of 0.82 mg/dL). Liver Function Tests: Recent Labs  Lab 09/10/20 1452  AST 18  ALT 11  ALKPHOS 68  BILITOT 0.6  PROT 6.8  ALBUMIN 3.8   No results for input(s): LIPASE, AMYLASE in the last 168 hours. No results for input(s): AMMONIA in the last 168 hours. Coagulation Profile: No results for input(s): INR, PROTIME in the last 168 hours. Cardiac Enzymes: No results for input(s): CKTOTAL, CKMB, CKMBINDEX, TROPONINI in the last 168 hours. BNP (last 3 results) No results for input(s): PROBNP in the last 8760 hours. HbA1C: No results for input(s): HGBA1C in the last 72 hours. CBG: No results for input(s): GLUCAP in the last 168 hours. Lipid Profile: No results for input(s): CHOL, HDL, LDLCALC, TRIG, CHOLHDL, LDLDIRECT in the last 72 hours. Thyroid Function Tests: Recent Labs    09/10/20 1452  TSH 1.720  FREET4 1.15*   Anemia Panel: No results for input(s): VITAMINB12, FOLATE, FERRITIN, TIBC, IRON, RETICCTPCT in the last 72 hours. Urine analysis:    Component Value Date/Time   COLORURINE Yellow 11/12/2012 1355   APPEARANCEUR Hazy 11/12/2012 1355   LABSPEC 1.005 11/12/2012 1355   PHURINE 7.0 11/12/2012 1355   GLUCOSEU Negative 11/12/2012 1355   HGBUR 1+ 11/12/2012 1355   BILIRUBINUR Negative 11/12/2012 1355   KETONESUR  Negative 11/12/2012 1355   PROTEINUR Negative 11/12/2012 1355   NITRITE Negative 11/12/2012 1355   LEUKOCYTESUR Trace 11/12/2012 1355    Radiological Exams on Admission: CT Angio Chest PE W and/or Wo Contrast  Result Date: 09/10/2020 CLINICAL DATA:  79 year old female with positive D-dimer. Concern for pulmonary embolism. EXAM: CT ANGIOGRAPHY CHEST WITH CONTRAST TECHNIQUE: Multidetector CT imaging of the chest was performed using the standard protocol during bolus administration of intravenous contrast. Multiplanar CT image reconstructions and MIPs were obtained to evaluate the vascular anatomy. CONTRAST:  44mL OMNIPAQUE IOHEXOL 350 MG/ML SOLN COMPARISON:  Chest radiograph dated 09/10/2020. FINDINGS: Cardiovascular: There is no cardiomegaly or pericardial effusion. Coronary vascular calcification of the LAD. There is mild atherosclerotic calcification of  the thoracic aorta. No aneurysmal dilatation or dissection. No pulmonary artery embolus identified. Mediastinum/Nodes: There is no hilar or mediastinal adenopathy. The esophagus is grossly unremarkable. No mediastinal fluid collection. Lungs/Pleura: Linear scarring in the right middle lobe and lingula. No focal consolidation, pleural effusion, or pneumothorax. There is a 3 mm left lower lobe subpleural nodule (65/12). The central airways are patent. Upper Abdomen: No acute abnormality. Musculoskeletal: No chest wall abnormality. No acute or significant osseous findings. Review of the MIP images confirms the above findings. IMPRESSION: No acute intrathoracic pathology. No CT evidence of pulmonary embolism. Electronically Signed   By: Elgie Collard M.D.   On: 09/10/2020 15:31   DG Chest Port 1 View  Result Date: 09/10/2020 CLINICAL DATA:  Midsternal chest pain. EXAM: PORTABLE CHEST 1 VIEW COMPARISON:  September 01, 2014 FINDINGS: The heart size and mediastinal contours are within normal limits. Both lungs are clear. The visualized skeletal structures  are unremarkable. IMPRESSION: No active disease. Electronically Signed   By: Sherian Rein M.D.   On: 09/10/2020 14:19    EKG: Independently reviewed.  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  Assessment/Plan Principal Problem:   Unstable angina (HCC) Active Problems:   Chest pain   Hypokalemia   Thyroid disease   COPD (chronic obstructive pulmonary disease) (HCC)   Hypertension     Unstable angina Patient presents for evaluation of left anterior chest wall pain that started at rest associated with shortness of breath and nausea She had transient improvement of her pain with nitroglycerin spray but continues to have chest pain which she rated 5 x 10 intensity at its worst Continue to cycle cardiac enzymes Nitropaste to anterior chest wall We will place patient on a heparin drip per protocol Consult cardiology Obtain 2D echocardiogram to assess LVEF    Nicotine dependence Smoking cessation was discussed with patient in detail She declines a nicotine transdermal patch at this time   COPD Continue as needed bronchodilator therapy Place patient on inhaled steroids   Hypertension Patient has been started on metoprolol Uptitrated to optimize blood pressure control   Hypokalemia Supplement potassium   DVT prophylaxis: Heparin Code Status: Full code Family Communication: Greater than 50% of time was spent discussing plan of care with patient at the bedside.  She verbalizes understanding and agrees with the plan. Disposition Plan: Back to previous home environment Consults called: Cardiology    Lucile Shutters MD [facility]     09/10/2020, 4:24 PM

## 2020-09-10 NOTE — ED Triage Notes (Signed)
Pt to ED via [facility] from home for chest pain that started this morning about 45 minutes PTA. Pt was given 324 mg of Aspirin by EMS and 1 spray of NTG. Pt arrives to the ED in NAD. Pt is having pain in mid to left chest, pt is rating pain 7/10. Pt states that she is also having pain in her back. Pt states that she is having some shortness of breath. Pt tachycardic at 112

## 2020-09-10 NOTE — Progress Notes (Signed)
ANTICOAGULATION CONSULT NOTE - Initial Consult  Pharmacy Consult for Heparin Drip Indication: chest pain/ACS  Allergies  Allergen Reactions  . Naproxen Sodium Anaphylaxis    Patient Measurements: Height: 5\' 4"  (162.6 cm) Weight: 77.1 kg (170 lb) IBW/kg (Calculated) : 54.7 Heparin Dosing Weight: 71 kg  Vital Signs: Temp: 98.1 F (36.7 C) (10/10 1302) Temp Source: Oral (10/10 1302) BP: 139/74 (10/10 1630) Pulse Rate: 83 (10/10 1630)  Labs: Recent Labs    09/10/20 1302 09/10/20 1452 09/10/20 1620  HGB 13.7  --   --   HCT 40.5  --   --   PLT 422*  --   --   APTT  --   --  30  LABPROT  --   --  11.8  INR  --   --  0.9  CREATININE 0.82  --   --   TROPONINIHS 9 37*  37*  --     Estimated Creatinine Clearance: 58.7 mL/min (by C-G formula based on SCr of 0.82 mg/dL).   Medical History: Past Medical History:  Diagnosis Date  . COPD (chronic obstructive pulmonary disease) (HCC)   . Hypertension   . Thyroid disease    Assessment: Patient is a 79yo female presented with chest pain. Pharmacy consulted for Heparin dosing. No prior anticoagulants noted. Baseline labs acceptable.  Goal of Therapy:  Heparin level 0.3-0.7 units/ml Monitor platelets by anticoagulation protocol: Yes   Plan:  Give 4000 units bolus x 1 Start heparin infusion at 850 units/hr Check anti-Xa level in 8 hours and daily while on heparin Continue to monitor H&H and platelets  79yo, PharmD, BCPS 09/10/2020 5:49 PM

## 2020-09-10 NOTE — ED Notes (Signed)
Cardiologist at bedside speaking with pt. Provided pillow to pt. HOB back to 60 degrees per pt request.

## 2020-09-10 NOTE — ED Notes (Addendum)
Pt is A&Ox4, sitting in bed resting on 2L [place], O2 sats at 97%, HR 94, NSR on monitor, heparin drip at 850u/hr.  Pt stated her neck was a little stiff so HOB was adjusted down with some relief.  Toileting discussed, pt instructed to call when she needs to get up and I will assist her to the bathroom.  Meal tray provided.  No other needs expressed at this time.

## 2020-09-11 ENCOUNTER — Encounter
Admission: EM | Disposition: A | Discharge status: Home / Self Care | Payer: Self-pay | Source: Home / Self Care | Attending: Emergency Medicine

## 2020-09-11 ENCOUNTER — Observation Stay
Admit: 2020-09-11 | Discharge: 2020-09-11 | Disposition: A | Discharge status: Home / Self Care | Payer: Medicare Other | Attending: Internal Medicine | Admitting: Internal Medicine

## 2020-09-11 ENCOUNTER — Encounter: Payer: Self-pay | Admitting: Internal Medicine

## 2020-09-11 DIAGNOSIS — I200 Unstable angina: Secondary | ICD-10-CM

## 2020-09-11 HISTORY — PX: LEFT HEART CATH AND CORONARY ANGIOGRAPHY: CATH118249

## 2020-09-11 LAB — HEPARIN LEVEL (UNFRACTIONATED)
Heparin Unfractionated: 3.42 IU/mL — ABNORMAL HIGH (ref 0.30–0.70)
Heparin Unfractionated: 0.18 IU/mL — ABNORMAL LOW (ref 0.30–0.70)

## 2020-09-11 LAB — MAGNESIUM: Magnesium: 1.8 mg/dL (ref 1.7–2.4)

## 2020-09-11 SURGERY — LEFT HEART CATH AND CORONARY ANGIOGRAPHY
Anesthesia: Moderate Sedation

## 2020-09-11 MED ORDER — SODIUM CHLORIDE 0.9 % WEIGHT BASED INFUSION: 3.0000 mL/kg/h | INTRAVENOUS | Status: AC

## 2020-09-11 MED ORDER — MIDAZOLAM HCL 2 MG/2ML IJ SOLN
INTRAMUSCULAR | Status: DC | PRN
  Administered 2020-09-11: 1 mg via INTRAVENOUS

## 2020-09-11 MED ORDER — ASPIRIN 81 MG PO CHEW
CHEWABLE_TABLET | ORAL | Status: AC
  Administered 2020-09-11: 81 mg
  Filled 2020-09-11: qty 1

## 2020-09-11 MED ORDER — SODIUM CHLORIDE 0.9 % WEIGHT BASED INFUSION: 1.0000 mL/kg/h | INTRAVENOUS | Status: DC

## 2020-09-11 MED ORDER — SODIUM CHLORIDE 0.9% FLUSH: 3.0000 mL | INTRAVENOUS | Status: DC | PRN

## 2020-09-11 MED ORDER — HEPARIN (PORCINE) IN NACL 1000-0.9 UT/500ML-% IV SOLN
INTRAVENOUS | Status: DC | PRN
  Administered 2020-09-11: 1000 mL

## 2020-09-11 MED ORDER — VERAPAMIL HCL 2.5 MG/ML IV SOLN
INTRAVENOUS | Status: AC
  Filled 2020-09-11: qty 2

## 2020-09-11 MED ORDER — FENTANYL CITRATE (PF) 100 MCG/2ML IJ SOLN
INTRAMUSCULAR | Status: AC
  Filled 2020-09-11: qty 2

## 2020-09-11 MED ORDER — MORPHINE SULFATE (PF) 2 MG/ML IV SOLN
1.0000 mg | Freq: Once | INTRAVENOUS | Status: AC
  Administered 2020-09-11: 1 mg via INTRAVENOUS
  Filled 2020-09-11: qty 1

## 2020-09-11 MED ORDER — HEPARIN BOLUS VIA INFUSION
2100.0000 [IU] | Freq: Once | INTRAVENOUS | Status: AC
  Administered 2020-09-11: 2100 [IU] via INTRAVENOUS
  Filled 2020-09-11: qty 2100

## 2020-09-11 MED ORDER — ASPIRIN 81 MG PO CHEW: 81.0000 mg | CHEWABLE_TABLET | ORAL | Status: DC

## 2020-09-11 MED ORDER — VERAPAMIL HCL 2.5 MG/ML IV SOLN
INTRAVENOUS | Status: DC | PRN
  Administered 2020-09-11: 2.5 mg via INTRA_ARTERIAL

## 2020-09-11 MED ORDER — SODIUM CHLORIDE 0.9% FLUSH: 3.0000 mL | Freq: Two times a day (BID) | INTRAVENOUS | Status: DC

## 2020-09-11 MED ORDER — SODIUM CHLORIDE 0.9 % IV SOLN: 250.0000 mL | INTRAVENOUS | Status: DC | PRN

## 2020-09-11 MED ORDER — MIDAZOLAM HCL 2 MG/2ML IJ SOLN
INTRAMUSCULAR | Status: AC
  Filled 2020-09-11: qty 2

## 2020-09-11 MED ORDER — IOHEXOL 300 MG/ML  SOLN
INTRAMUSCULAR | Status: DC | PRN
  Administered 2020-09-11: 70 mL

## 2020-09-11 MED ORDER — ACETAMINOPHEN 325 MG PO TABS
ORAL_TABLET | ORAL | Status: AC
  Filled 2020-09-11: qty 2

## 2020-09-11 MED ORDER — HEPARIN SODIUM (PORCINE) 1000 UNIT/ML IJ SOLN
INTRAMUSCULAR | Status: AC
  Filled 2020-09-11: qty 1

## 2020-09-11 MED ORDER — ONDANSETRON HCL 4 MG/2ML IJ SOLN: 4.0000 mg | Freq: Four times a day (QID) | INTRAMUSCULAR | Status: DC | PRN

## 2020-09-11 MED ORDER — HYDRALAZINE HCL 20 MG/ML IJ SOLN: 10.0000 mg | INTRAMUSCULAR | Status: DC | PRN

## 2020-09-11 MED ORDER — ACETAMINOPHEN 325 MG PO TABS
650.0000 mg | ORAL_TABLET | ORAL | Status: DC | PRN
  Administered 2020-09-11: 650 mg via ORAL

## 2020-09-11 MED ORDER — HEPARIN SODIUM (PORCINE) 1000 UNIT/ML IJ SOLN
INTRAMUSCULAR | Status: DC | PRN
  Administered 2020-09-11: 3500 [IU] via INTRAVENOUS

## 2020-09-11 MED ORDER — FENTANYL CITRATE (PF) 100 MCG/2ML IJ SOLN
INTRAMUSCULAR | Status: DC | PRN
Start: 2020-09-11 — End: 2020-09-11
  Administered 2020-09-11: 25 ug via INTRAVENOUS

## 2020-09-11 MED ORDER — LABETALOL HCL 5 MG/ML IV SOLN: 10.0000 mg | INTRAVENOUS | Status: DC | PRN

## 2020-09-11 MED ORDER — HEPARIN (PORCINE) IN NACL 1000-0.9 UT/500ML-% IV SOLN
INTRAVENOUS | Status: AC
  Filled 2020-09-11: qty 1000

## 2020-09-11 MED ORDER — SIMETHICONE 80 MG PO CHEW
160.0000 mg | CHEWABLE_TABLET | Freq: Once | ORAL | Status: AC
  Administered 2020-09-11: 160 mg via ORAL
  Filled 2020-09-11 (×2): qty 2

## 2020-09-11 MED ORDER — ATORVASTATIN CALCIUM 40 MG PO TABS
40.0000 mg | ORAL_TABLET | Freq: Every day | ORAL | 0 refills | Status: DC
Start: 2020-09-11 — End: 2021-12-16

## 2020-09-11 SURGICAL SUPPLY — 10 items
CATH 5F 110X4 TIG (CATHETERS) ×3 IMPLANT
CATH INFINITI 5FR ANG PIGTAIL (CATHETERS) ×3 IMPLANT
CATH INFINITI 5FR JL4 (CATHETERS) ×3 IMPLANT
DEVICE RAD TR BAND REGULAR (VASCULAR PRODUCTS) ×3 IMPLANT
GLIDESHEATH SLEND SS 6F .021 (SHEATH) ×3 IMPLANT
GUIDEWIRE INQWIRE 1.5J.035X260 (WIRE) ×2 IMPLANT
INQWIRE 1.5J .035X260CM (WIRE) ×6
KIT MANI 3VAL PERCEP (MISCELLANEOUS) ×3 IMPLANT
PACK CARDIAC CATH (CUSTOM PROCEDURE TRAY) ×3 IMPLANT
WIRE EMERALD ST .035X150CM (WIRE) ×3 IMPLANT

## 2020-09-11 NOTE — Progress Notes (Signed)
*  PRELIMINARY RESULTS* Echocardiogram 2D Echocardiogram has been performed.  Cristela Blue 09/11/2020, 1:07 PM

## 2020-09-11 NOTE — Progress Notes (Signed)
Dr. Tilman Neat at bedside, pt will be discharged to home this afternoon. Family updated.

## 2020-09-11 NOTE — Progress Notes (Signed)
TR band removed, no bleeding noted, no s/s hematoma noted. +CMS intact RUE. Echo performed at bedside prior to discharge.

## 2020-09-11 NOTE — Progress Notes (Addendum)
[facility]    SUBJECTIVE: patient denies chest pain or shortness of breath.   Vitals:   09/11/20 1000 09/11/20 1015 09/11/20 1030 09/11/20 1045  BP: 135/63 130/65 132/65 139/66  Pulse: 66 67 66 64  Resp: 17 18 19 18   Temp:      TempSrc:      SpO2: 95% 99% 99% 98%  Weight:      Height:        No intake or output data in the 24 hours ending 09/11/20 1100    LABS: Basic Metabolic Panel: Recent Labs    09/10/20 1302 09/11/20 0402  NA 132*  --   K 3.3*  --   CL 93*  --   CO2 26  --   GLUCOSE 148*  --   BUN 8  --   CREATININE 0.82  --   CALCIUM 9.6  --   MG  --  1.8   Liver Function Tests: Recent Labs    09/10/20 1452  AST 18  ALT 11  ALKPHOS 68  BILITOT 0.6  PROT 6.8  ALBUMIN 3.8   No results for input(s): LIPASE, AMYLASE in the last 72 hours. CBC: Recent Labs    09/10/20 1302  WBC 6.0  HGB 13.7  HCT 40.5  MCV 94.0  PLT 422*   Cardiac Enzymes: No results for input(s): CKTOTAL, CKMB, CKMBINDEX, TROPONINI in the last 72 hours. BNP: Invalid input(s): POCBNP D-Dimer: No results for input(s): DDIMER in the last 72 hours. Hemoglobin A1C: No results for input(s): HGBA1C in the last 72 hours. Fasting Lipid Panel: Recent Labs    09/10/20 1452  CHOL 158  HDL 53  LDLCALC 85  TRIG 100  CHOLHDL 3.0   Thyroid Function Tests: Recent Labs    09/10/20 1452  TSH 1.720   Anemia Panel: No results for input(s): VITAMINB12, FOLATE, FERRITIN, TIBC, IRON, RETICCTPCT in the last 72 hours.  CT Angio Chest PE W and/or Wo Contrast  Result Date: 09/10/2020 CLINICAL DATA:  79 year old female with positive D-dimer. Concern for pulmonary embolism. EXAM: CT ANGIOGRAPHY CHEST WITH CONTRAST TECHNIQUE: Multidetector CT imaging of the chest was performed using the standard protocol during bolus administration of intravenous contrast. Multiplanar CT image reconstructions and MIPs were obtained to evaluate the vascular anatomy. CONTRAST:  43mL OMNIPAQUE IOHEXOL 350  MG/ML SOLN COMPARISON:  Chest radiograph dated 09/10/2020. FINDINGS: Cardiovascular: There is no cardiomegaly or pericardial effusion. Coronary vascular calcification of the LAD. There is mild atherosclerotic calcification of the thoracic aorta. No aneurysmal dilatation or dissection. No pulmonary artery embolus identified. Mediastinum/Nodes: There is no hilar or mediastinal adenopathy. The esophagus is grossly unremarkable. No mediastinal fluid collection. Lungs/Pleura: Linear scarring in the right middle lobe and lingula. No focal consolidation, pleural effusion, or pneumothorax. There is a 3 mm left lower lobe subpleural nodule (65/12). The central airways are patent. Upper Abdomen: No acute abnormality. Musculoskeletal: No chest wall abnormality. No acute or significant osseous findings. Review of the MIP images confirms the above findings. IMPRESSION: No acute intrathoracic pathology. No CT evidence of pulmonary embolism. Electronically Signed   By: 11/10/2020 M.D.   On: 09/10/2020 15:31   CARDIAC CATHETERIZATION  Result Date: 09/11/2020  Ost RCA to Prox RCA lesion is 50% stenosed.  Prox LAD lesion is 30% stenosed.  1.  Insignificant coronary artery disease with 50% stenosis ostium RCA 2.  Normal left ventricular function Recommendations 1.  Continue medical therapy 2.  Aggressive risk factor modification 3.  May discharge  home 4.  Follow-up with Dr. Lady Gary in 1 week   DG Chest [facility] 1 View  Result Date: 09/10/2020 CLINICAL DATA:  Midsternal chest pain. EXAM: PORTABLE CHEST 1 VIEW COMPARISON:  September 01, 2014 FINDINGS: The heart size and mediastinal contours are within normal limits. Both lungs are clear. The visualized skeletal structures are unremarkable. IMPRESSION: No active disease. Electronically Signed   By: Sherian Rein M.D.   On: 09/10/2020 14:19    TELEMETRY: sinus rhythm  ASSESSMENT AND PLAN:  Principal Problem:   Unstable angina (HCC) Active Problems:   Chest pain    Hypokalemia   Thyroid disease   COPD (chronic obstructive pulmonary disease) (HCC)   Hypertension    1. Chest pain, mildly elevated, flat, with chest pain at rest, nondiagnostic ECG, which showed evidence of old anteroseptal MI, placed on heparin drip. Cardiac catheterization showed insignificant CAD with 50% stenosis ostium RCA and normal left ventricular function. Medical management recommended.  2. Paroxysmal atrial fibrillation, noted on rhythm strip per EMS without recurrence during admission, not on chronic anticoagulation with a chads vasc score of 4 3. COPD with ongoing tobacco abuse 4. Essential hypertension 5. Hyperlipidemia, on atorvastatin 40 mg  Recommendations: 1. Continue aspirin and Plavix. 2. Continue atorvastatin 40 mg daily 3. Will assess atrial fibrillation burden with Holter monitor and necessity of chronic anticoagulation as outpatient  4. Continue metoprolol tartrate 5. Recommend smoking cessation 6. Patient may be discharged from a cardiovascular perspective, and follow-up with Dr. Darrold Junker in 1 week   Linda Ivanoff, PA-C 09/11/2020 11:00 AM

## 2020-09-11 NOTE — Discharge Summary (Signed)
Physician Discharge Summary  Linda Brady:[id number] DOB: 1941/05/10 DOA: 09/10/2020  PCP: [name], MD  Admit date: 09/10/2020 Discharge date: 09/11/2020  Admitted From: Home Disposition: Home  Recommendations for Outpatient Follow-up:  1. Follow up with PCP in 1-2 weeks 2. Please obtain BMP/CBC in one week 3. Please follow up on the following pending results: Echocardiogram  Home Health: No Equipment/Devices: None Discharge Condition: Stable CODE STATUS:  Diet recommendation: Heart Healthy   Brief/Interim Summary: Linda Brady is a 79 y.o. female with medical history significant for nicotine dependence, COPD, hypertension who presents to the emergency room for evaluation of chest pain that started this morning about 45 hours prior to her presentation.  Chest pain is mostly over the left anterior chest wall, started at rest and is associated with pain in her neck and back.  She also has an history of chronic neck and back pain.  No significant associated factors. Imaging was without any significant changes.  EKG with sinus tachycardia and no other new changes.  Troponin mildly positive at 43. For concern of unstable angina cardiology was consulted and she was taken to cardiac catheterization.  Which shows insignificant coronary stenosis with 50% stenosis of RCA.  No other blockage found.  She was discharged on aspirin and Lipitor and will follow up with cardiology as an outpatient.  Patient will continue with rest of her home meds and follow-up with her providers.  Discharge Diagnoses:  Principal Problem:   Unstable angina (HCC) Active Problems:   Chest pain   Hypokalemia   Thyroid disease   COPD (chronic obstructive pulmonary disease) (HCC)   Hypertension  Discharge Instructions  Discharge Instructions    Diet - low sodium heart healthy   Complete by: As directed    Discharge instructions   Complete by: As directed    It was pleasure taking care of  you. Continue taking your medications as directed and follow-up with your cardiologist in 1 week.   Increase activity slowly   Complete by: As directed      Allergies as of 09/11/2020      Reactions   Naproxen Sodium Anaphylaxis      Medication List    STOP taking these medications   simvastatin 20 MG tablet Commonly known as: ZOCOR     TAKE these medications   aspirin 81 MG EC tablet Take 81 mg by mouth daily.   atorvastatin 40 MG tablet Commonly known as: LIPITOR Take 1 tablet (40 mg total) by mouth daily.   cholecalciferol 25 MCG (1000 UNIT) tablet Commonly known as: VITAMIN D3 Take 1,000 Units by mouth daily.   dicyclomine 10 MG capsule Commonly known as: BENTYL Take 10 mg by mouth 4 (four) times daily as needed.   enalapril 20 MG tablet Commonly known as: VASOTEC Take 20 mg by mouth 2 (two) times daily.   FLUoxetine 20 MG capsule Commonly known as: PROZAC Take 20 mg by mouth daily.   hydrochlorothiazide 25 MG tablet Commonly known as: HYDRODIURIL Take 12.5 mg by mouth daily.   hydrOXYzine 25 MG tablet Commonly known as: ATARAX/VISTARIL Take 25 mg by mouth 3 (three) times daily as needed for anxiety.   levothyroxine 75 MCG tablet Commonly known as: SYNTHROID Take 75 mcg by mouth daily.   RABEprazole 20 MG tablet Commonly known as: ACIPHEX Take 20 mg by mouth daily.   Spiriva HandiHaler 18 MCG inhalation capsule Generic drug: tiotropium Place 1 capsule into inhaler and inhale daily.   vitamin  B-12 1000 MCG tablet Commonly known as: CYANOCOBALAMIN Take 1,000 mcg by mouth daily.       Follow-up Information    Paraschos, Lyn HollingsheadAlexander, MD Follow up.   Specialty: Cardiology Why: appt on Oct 19th Tuesday at 315PM Contact information: [address] [facility] [place] [address] [phone]        [place], [facility] .   Contact information: [facility], [place] [address] [place]  [place] [id number] [phone]              Allergies  Allergen Reactions  . Naproxen Sodium Anaphylaxis    Consultations:  Cardiology  Procedures/Studies: CT Angio Chest PE W and/or Wo Contrast  Result Date: 09/10/2020 CLINICAL DATA:  79 year old female with positive D-dimer. Concern for pulmonary embolism. EXAM: CT ANGIOGRAPHY CHEST WITH CONTRAST TECHNIQUE: Multidetector CT imaging of the chest was performed using the standard protocol during bolus administration of intravenous contrast. Multiplanar CT image reconstructions and MIPs were obtained to evaluate the vascular anatomy. CONTRAST:  75mL OMNIPAQUE IOHEXOL 350 MG/ML SOLN COMPARISON:  Chest radiograph dated 09/10/2020. FINDINGS: Cardiovascular: There is no cardiomegaly or pericardial effusion. Coronary vascular calcification of the LAD. There is mild atherosclerotic calcification of the thoracic aorta. No aneurysmal dilatation or dissection. No pulmonary artery embolus identified. Mediastinum/Nodes: There is no hilar or mediastinal adenopathy. The esophagus is grossly unremarkable. No mediastinal fluid collection. Lungs/Pleura: Linear scarring in the right middle lobe and lingula. No focal consolidation, pleural effusion, or pneumothorax. There is a 3 mm left lower lobe subpleural nodule (65/12). The central airways are patent. Upper Abdomen: No acute abnormality. Musculoskeletal: No chest wall abnormality. No acute or significant osseous findings. Review of the MIP images confirms the above findings. IMPRESSION: No acute intrathoracic pathology. No CT evidence of pulmonary embolism. Electronically Signed   By: Elgie CollardArash  Radparvar M.D.   On: 09/10/2020 15:31   CARDIAC CATHETERIZATION  Result Date: 09/11/2020  Ost RCA to Prox RCA lesion is 50% stenosed.  Prox LAD lesion is 30% stenosed.  1.  Insignificant coronary artery disease with 50% stenosis ostium RCA 2.  Normal left ventricular function Recommendations 1.  Continue medical therapy 2.   Aggressive risk factor modification 3.  May discharge home 4.  Follow-up with Dr. Lady GaryFath in 1 week   [facility]  Result Date: 09/10/2020 CLINICAL DATA:  Midsternal chest pain. EXAM: PORTABLE CHEST 1 VIEW COMPARISON:  September 01, 2014 FINDINGS: The heart size and mediastinal contours are within normal limits. Both lungs are clear. The visualized skeletal structures are unremarkable. IMPRESSION: No active disease. Electronically Signed   By: Sherian ReinWei-Chen  Lin M.D.   On: 09/10/2020 14:19     Subjective: Patient was seen after cardiac catheterization today.  No chest pain or shortness of breath.  Having mild headache.  No other associated symptoms.  Discharge Exam: Vitals:   09/11/20 1245 09/11/20 1300  BP: 140/73 137/71  Pulse: 68 73  Resp: (!) 23 16  Temp:    SpO2: 93% 92%   Vitals:   09/11/20 1215 09/11/20 1230 09/11/20 1245 09/11/20 1300  BP: (!) 143/66 (!) 144/70 140/73 137/71  Pulse: 69 72 68 73  Resp: (!) 21 18 (!) 23 16  Temp:      TempSrc:      SpO2: 92% 90% 93% 92%  Weight:      Height:        General: Pt is alert, awake, not in acute distress Cardiovascular: RRR,  S1/S2 +, no rubs, no gallops Respiratory: CTA bilaterally, no wheezing, no rhonchi Abdominal: Soft, NT, ND, bowel sounds + Extremities: no edema, no cyanosis   The results of significant diagnostics from this hospitalization (including imaging, microbiology, ancillary and laboratory) are listed below for reference.    Microbiology: Recent Results (from the past 240 hour(s))  Resp Panel by RT PCR (RSV, Flu A&B, Covid) - Nasopharyngeal Swab     Status: None   Collection Time: 09/10/20  1:32 PM   Specimen: Nasopharyngeal Swab  Result Value Ref Range Status   SARS Coronavirus 2 by RT PCR NEGATIVE NEGATIVE Final    Comment: (NOTE) SARS-CoV-2 target nucleic acids are NOT DETECTED.  The SARS-CoV-2 RNA is generally detectable in upper respiratoy specimens during the acute phase of infection. The  lowest concentration of SARS-CoV-2 viral copies this assay can detect is 131 copies/mL. A negative result does not preclude SARS-Cov-2 infection and should not be used as the sole basis for treatment or other patient management decisions. A negative result may occur with  improper specimen collection/handling, submission of specimen other than nasopharyngeal swab, presence of viral mutation(s) within the areas targeted by this assay, and inadequate number of viral copies (<131 copies/mL). A negative result must be combined with clinical observations, patient history, and epidemiological information. The expected result is Negative.  Fact Sheet for Patients:  https://www.moore.com/  Fact Sheet for Healthcare Providers:  https://www.young.biz/  This test is no t yet approved or cleared by the [place] FDA and  has been authorized for detection and/or diagnosis of SARS-CoV-2 by FDA under an Emergency Use Authorization (EUA). This EUA will remain  in effect (meaning this test can be used) for the duration of the COVID-19 declaration under Section 564(b)(1) of the Act, 21 U.S.C. section 360bbb-3(b)(1), unless the authorization is terminated or revoked sooner.     Influenza A by PCR NEGATIVE NEGATIVE Final   Influenza B by PCR NEGATIVE NEGATIVE Final    Comment: (NOTE) The Xpert Xpress SARS-CoV-2/FLU/RSV assay is intended as an aid in  the diagnosis of influenza from Nasopharyngeal swab specimens and  should not be used as a sole basis for treatment. Nasal washings and  aspirates are unacceptable for Xpert Xpress SARS-CoV-2/FLU/RSV  testing.  Fact Sheet for Patients: https://www.moore.com/  Fact Sheet for Healthcare Providers: https://www.young.biz/  This test is not yet approved or cleared by the [place] FDA and  has been authorized for detection and/or diagnosis of SARS-CoV-2 by  FDA under  an Emergency Use Authorization (EUA). This EUA will remain  in effect (meaning this test can be used) for the duration of the  Covid-19 declaration under Section 564(b)(1) of the Act, 21  U.S.C. section 360bbb-3(b)(1), unless the authorization is  terminated or revoked.    Respiratory Syncytial Virus by PCR NEGATIVE NEGATIVE Final    Comment: (NOTE) Fact Sheet for Patients: https://www.moore.com/  Fact Sheet for Healthcare Providers: https://www.young.biz/  This test is not yet approved or cleared by the [place] FDA and  has been authorized for detection and/or diagnosis of SARS-CoV-2 by  FDA under an Emergency Use Authorization (EUA). This EUA will remain  in effect (meaning this test can be used) for the duration of the  COVID-19 declaration under Section 564(b)(1) of the Act, 21 U.S.C.  section 360bbb-3(b)(1), unless the authorization is terminated or  revoked. Performed at [facility], [address] [place], [place] [address]      Labs: BNP (last 3 results) No results  for input(s): BNP in the last 8760 hours. Basic Metabolic Panel: Recent Labs  Lab 09/10/20 1302 09/11/20 0402  NA 132*  --   K 3.3*  --   CL 93*  --   CO2 26  --   GLUCOSE 148*  --   BUN 8  --   CREATININE 0.82  --   CALCIUM 9.6  --   MG  --  1.8   Liver Function Tests: Recent Labs  Lab 09/10/20 1452  AST 18  ALT 11  ALKPHOS 68  BILITOT 0.6  PROT 6.8  ALBUMIN 3.8   No results for input(s): LIPASE, AMYLASE in the last 168 hours. No results for input(s): AMMONIA in the last 168 hours. CBC: Recent Labs  Lab 09/10/20 1302  WBC 6.0  HGB 13.7  HCT 40.5  MCV 94.0  PLT 422*   Cardiac Enzymes: No results for input(s): CKTOTAL, CKMB, CKMBINDEX, TROPONINI in the last 168 hours. BNP: Invalid input(s): POCBNP CBG: No results for input(s): GLUCAP in the last 168 hours. D-Dimer No results for input(s): DDIMER in the last 72  hours. Hgb A1c No results for input(s): HGBA1C in the last 72 hours. Lipid Profile Recent Labs    09/10/20 1452  CHOL 158  HDL 53  LDLCALC 85  TRIG 100  CHOLHDL 3.0   Thyroid function studies Recent Labs    09/10/20 1452  TSH 1.720   Anemia work up No results for input(s): VITAMINB12, FOLATE, FERRITIN, TIBC, IRON, RETICCTPCT in the last 72 hours. Urinalysis    Component Value Date/Time   COLORURINE Yellow 11/12/2012 1355   APPEARANCEUR Hazy 11/12/2012 1355   LABSPEC 1.005 11/12/2012 1355   PHURINE 7.0 11/12/2012 1355   GLUCOSEU Negative 11/12/2012 1355   HGBUR 1+ 11/12/2012 1355   BILIRUBINUR Negative 11/12/2012 1355   KETONESUR Negative 11/12/2012 1355   PROTEINUR Negative 11/12/2012 1355   NITRITE Negative 11/12/2012 1355   LEUKOCYTESUR Trace 11/12/2012 1355   Sepsis Labs Invalid input(s): PROCALCITONIN,  WBC,  LACTICIDVEN Microbiology Recent Results (from the past 240 hour(s))  Resp Panel by RT PCR (RSV, Flu A&B, Covid) - Nasopharyngeal Swab     Status: None   Collection Time: 09/10/20  1:32 PM   Specimen: Nasopharyngeal Swab  Result Value Ref Range Status   SARS Coronavirus 2 by RT PCR NEGATIVE NEGATIVE Final    Comment: (NOTE) SARS-CoV-2 target nucleic acids are NOT DETECTED.  The SARS-CoV-2 RNA is generally detectable in upper respiratoy specimens during the acute phase of infection. The lowest concentration of SARS-CoV-2 viral copies this assay can detect is 131 copies/mL. A negative result does not preclude SARS-Cov-2 infection and should not be used as the sole basis for treatment or other patient management decisions. A negative result may occur with  improper specimen collection/handling, submission of specimen other than nasopharyngeal swab, presence of viral mutation(s) within the areas targeted by this assay, and inadequate number of viral copies (<131 copies/mL). A negative result must be combined with clinical observations, patient history,  and epidemiological information. The expected result is Negative.  Fact Sheet for Patients:  https://www.moore.com/  Fact Sheet for Healthcare Providers:  https://www.young.biz/  This test is no t yet approved or cleared by the [place] FDA and  has been authorized for detection and/or diagnosis of SARS-CoV-2 by FDA under an Emergency Use Authorization (EUA). This EUA will remain  in effect (meaning this test can be used) for the duration of the COVID-19 declaration under Section 564(b)(1) of the  Act, 21 U.S.C. section 360bbb-3(b)(1), unless the authorization is terminated or revoked sooner.     Influenza A by PCR NEGATIVE NEGATIVE Final   Influenza B by PCR NEGATIVE NEGATIVE Final    Comment: (NOTE) The Xpert Xpress SARS-CoV-2/FLU/RSV assay is intended as an aid in  the diagnosis of influenza from Nasopharyngeal swab specimens and  should not be used as a sole basis for treatment. Nasal washings and  aspirates are unacceptable for Xpert Xpress SARS-CoV-2/FLU/RSV  testing.  Fact Sheet for Patients: https://www.moore.com/  Fact Sheet for Healthcare Providers: https://www.young.biz/  This test is not yet approved or cleared by the [place] FDA and  has been authorized for detection and/or diagnosis of SARS-CoV-2 by  FDA under an Emergency Use Authorization (EUA). This EUA will remain  in effect (meaning this test can be used) for the duration of the  Covid-19 declaration under Section 564(b)(1) of the Act, 21  U.S.C. section 360bbb-3(b)(1), unless the authorization is  terminated or revoked.    Respiratory Syncytial Virus by PCR NEGATIVE NEGATIVE Final    Comment: (NOTE) Fact Sheet for Patients: https://www.moore.com/  Fact Sheet for Healthcare Providers: https://www.young.biz/  This test is not yet approved or cleared by the [place] FDA and   has been authorized for detection and/or diagnosis of SARS-CoV-2 by  FDA under an Emergency Use Authorization (EUA). This EUA will remain  in effect (meaning this test can be used) for the duration of the  COVID-19 declaration under Section 564(b)(1) of the Act, 21 U.S.C.  section 360bbb-3(b)(1), unless the authorization is terminated or  revoked. Performed at [facility], [address] [place], [place] [address]     Time coordinating discharge: Over 30 minutes  SIGNED:  Arnetha Courser, MD  [facility] 09/11/2020, 1:44 PM  If 7PM-7AM, please contact night-coverage [email or web address]  This record has been created using Conservation officer, historic buildings. Errors have been sought and corrected,but may not always be located. Such creation errors do not reflect on the standard of care.

## 2020-09-11 NOTE — ED Provider Notes (Signed)
-----------------------------------------   2:25 AM on 09/11/2020 -----------------------------------------  ED ECG REPORT I, Loleta Rose, the attending physician, personally viewed and interpreted this ECG.  Date: 09/11/2020 EKG Time: 2:19 AM Rate: 79 Rhythm: normal sinus rhythm QRS Axis: normal Intervals: normal ST/T Wave abnormalities: Non-specific ST segment / T-wave changes, but no clear evidence of acute ischemia. Narrative Interpretation: no definitive evidence of acute ischemia; does not meet STEMI criteria.     Loleta Rose, MD 09/11/20 [phone]

## 2020-09-11 NOTE — Progress Notes (Signed)
Dr. Darrold Junker at bedside, spoke to patient. Explained procedure and outcome. Per provider pt may go home later today and f/u in one week.  Patient verbalizes undertanding

## 2020-09-11 NOTE — Progress Notes (Signed)
Patient to specials for procedure. °

## 2020-09-11 NOTE — Progress Notes (Signed)
Tylenol 650mg  po effective for headache per patient.

## 2020-09-11 NOTE — Progress Notes (Signed)
ANTICOAGULATION CONSULT NOTE - Initial Consult  Pharmacy Consult for Heparin Drip Indication: chest pain/ACS  Allergies  Allergen Reactions  . Naproxen Sodium Anaphylaxis    Patient Measurements: Height: 5\' 4"  (162.6 cm) Weight: 77.1 kg (170 lb) IBW/kg (Calculated) : 54.7 Heparin Dosing Weight: 71 kg  Vital Signs: BP: 149/78 (10/10 2130) Pulse Rate: 82 (10/10 2300)  Labs: Recent Labs    09/10/20 1302 09/10/20 1452 09/10/20 1620 09/10/20 1820 09/11/20 0217  HGB 13.7  --   --   --   --   HCT 40.5  --   --   --   --   PLT 422*  --   --   --   --   APTT  --   --  30  --   --   LABPROT  --   --  11.8  --   --   INR  --   --  0.9  --   --   HEPARINUNFRC  --   --   --   --  3.42*  CREATININE 0.82  --   --   --   --   TROPONINIHS 9 37*  37*  --  43*  --     Estimated Creatinine Clearance: 58.7 mL/min (by C-G formula based on SCr of 0.82 mg/dL).   Medical History: Past Medical History:  Diagnosis Date  . COPD (chronic obstructive pulmonary disease) (HCC)   . Hypertension   . Thyroid disease    Assessment: Patient is a 79yo female presented with chest pain. Pharmacy consulted for Heparin dosing. No prior anticoagulants noted. Baseline labs acceptable.  Goal of Therapy:  Heparin level 0.3-0.7 units/ml Monitor platelets by anticoagulation protocol: Yes   Plan:  Give 4000 units bolus x 1 Start heparin infusion at 850 units/hr Check anti-Xa level in 8 hours and daily while on heparin Continue to monitor H&H and platelets   10/11:  HL @ 0217 = 3.42 Spoke with ED RN who drew level.  He said he paused heparin drip but drew from same arm it was infusing.  Will repeat level to ensure accuracy.   Khaalid Lefkowitz D 09/11/2020 4:00 AM

## 2020-09-11 NOTE — Progress Notes (Signed)
ANTICOAGULATION CONSULT NOTE - Initial Consult  Pharmacy Consult for Heparin Drip Indication: chest pain/ACS  Allergies  Allergen Reactions  . Naproxen Sodium Anaphylaxis    Patient Measurements: Height: 5\' 4"  (162.6 cm) Weight: 77.1 kg (170 lb) IBW/kg (Calculated) : 54.7 Heparin Dosing Weight: 71 kg  Vital Signs: Temp: 98 F (36.7 C) (10/11 0403) BP: 128/72 (10/11 0403) Pulse Rate: 66 (10/11 0403)  Labs: Recent Labs    09/10/20 1302 09/10/20 1452 09/10/20 1620 09/10/20 1820 09/11/20 0217 09/11/20 0402  HGB 13.7  --   --   --   --   --   HCT 40.5  --   --   --   --   --   PLT 422*  --   --   --   --   --   APTT  --   --  30  --   --   --   LABPROT  --   --  11.8  --   --   --   INR  --   --  0.9  --   --   --   HEPARINUNFRC  --   --   --   --  3.42* 0.18*  CREATININE 0.82  --   --   --   --   --   TROPONINIHS 9 37*  37*  --  43*  --   --     Estimated Creatinine Clearance: 58.7 mL/min (by C-G formula based on SCr of 0.82 mg/dL).   Medical History: Past Medical History:  Diagnosis Date  . COPD (chronic obstructive pulmonary disease) (HCC)   . Hypertension   . Thyroid disease    Assessment: Patient is a 79yo female presented with chest pain. Pharmacy consulted for Heparin dosing. No prior anticoagulants noted. Baseline labs acceptable.  Goal of Therapy:  Heparin level 0.3-0.7 units/ml Monitor platelets by anticoagulation protocol: Yes   Plan:  Give 4000 units bolus x 1 Start heparin infusion at 850 units/hr Check anti-Xa level in 8 hours and daily while on heparin Continue to monitor H&H and platelets   10/11:  HL @ 0217 = 3.42 Spoke with ED RN who drew level.  He said he paused heparin drip but drew from same arm it was infusing.  Will repeat level to ensure accuracy.   10/11:  HL@ 0402 = 0.18 Will order heparin 2100 units IV X 1 bolus and increase drip rate to 1100 units/hr. Will recheck HL 8 hrs after rate change.   Eoin Willden  D 09/11/2020 4:46 AM

## 2020-09-11 NOTE — Progress Notes (Signed)
ASA 81mg  chewable administered per orders.

## 2020-09-12 LAB — ECHOCARDIOGRAM COMPLETE
AR max vel: 1.82 cm2
AV Area VTI: 2.05 cm2
AV Area mean vel: 1.85 cm2
AV Mean grad: 18.3 mmHg
AV Peak grad: 32 mmHg
Ao pk vel: 2.83 m/s
Area-P 1/2: 2.96 cm2
Height: 65 in
S' Lateral: 2.51 cm
Weight: 2720 oz

## 2020-09-19 DIAGNOSIS — Z9889 Other specified postprocedural states: Secondary | ICD-10-CM | POA: Insufficient documentation

## 2021-07-09 ENCOUNTER — Encounter: Payer: Self-pay | Admitting: Cardiology

## 2021-07-09 ENCOUNTER — Ambulatory Visit (INDEPENDENT_AMBULATORY_CARE_PROVIDER_SITE_OTHER): Payer: Medicare Other | Admitting: Cardiology

## 2021-07-09 ENCOUNTER — Other Ambulatory Visit: Payer: Self-pay

## 2021-07-09 VITALS — BP 130/82 | HR 94 | Ht 63.0 in | Wt 182.0 lb

## 2021-07-09 DIAGNOSIS — I2510 Atherosclerotic heart disease of native coronary artery without angina pectoris: Secondary | ICD-10-CM

## 2021-07-09 DIAGNOSIS — I10 Essential (primary) hypertension: Secondary | ICD-10-CM

## 2021-07-09 DIAGNOSIS — R0600 Dyspnea, unspecified: Secondary | ICD-10-CM

## 2021-07-09 DIAGNOSIS — R0609 Other forms of dyspnea: Secondary | ICD-10-CM

## 2021-07-09 MED ORDER — NITROGLYCERIN 0.4 MG SL SUBL: 0.4000 mg | SUBLINGUAL_TABLET | SUBLINGUAL | 3 refills | Status: DC | PRN

## 2021-07-09 NOTE — Patient Instructions (Addendum)
Medication Instructions:   Your physician has recommended you make the following change in your medication:    START taking Nitroglycerin 0.4 MG Sublingual (for chest pain)  every 5 mins for a a maximum of 3 doses per day.  *If you need a refill on your cardiac medications before your next appointment, please call your pharmacy*   Lab Work: None ordered If you have labs (blood work) drawn today and your tests are completely normal, you will receive your results only by: MyChart Message (if you have MyChart) OR A paper copy in the mail If you have any lab test that is abnormal or we need to change your treatment, we will call you to review the results.   Testing/Procedures: None ordered   Follow-Up: At [facility], you and your health needs are our priority.  As part of our continuing mission to provide you with exceptional heart care, we have created designated Provider Care Teams.  These Care Teams include your primary Cardiologist (physician) and Advanced Practice Providers (APPs -  Physician Assistants and Nurse Practitioners) who all work together to provide you with the care you need, when you need it.  We recommend signing up for the patient portal called "MyChart".  Sign up information is provided on this After Visit Summary.  MyChart is used to connect with patients for Virtual Visits (Telemedicine).  Patients are able to view lab/test results, encounter notes, upcoming appointments, etc.  Non-urgent messages can be sent to your provider as well.   To learn more about what you can do with MyChart, go to [email or web address].    Your next appointment:   6 month(s)  The format for your next appointment:   In Person  Provider:   Debbe Odea, MD   Other Instructions  Healthy Eating Following a healthy eating pattern may help you to achieve and maintain a healthy body weight, reduce the risk of chronic disease, and live a long and productive life. It is  important to follow a healthy eating pattern at an appropriate calorie level for your body. Your nutritional needs should be metprimarily through food by choosing a variety of nutrient-rich foods. What are tips for following this plan? Reading food labels Read labels and choose the following: Reduced or low sodium. Juices with 100% fruit juice. Foods with low saturated fats and high polyunsaturated and monounsaturated fats. Foods with whole grains, such as whole wheat, cracked wheat, brown rice, and wild rice. Whole grains that are fortified with folic acid. This is recommended for women who are pregnant or who want to become pregnant. Read labels and avoid the following: Foods with a lot of added sugars. These include foods that contain brown sugar, corn sweetener, corn syrup, dextrose, fructose, glucose, high-fructose corn syrup, honey, invert sugar, lactose, malt syrup, maltose, molasses, raw sugar, sucrose, trehalose, or turbinado sugar. Do not eat more than the following amounts of added sugar per day: 6 teaspoons (25 g) for women. 9 teaspoons (38 g) for men. Foods that contain processed or refined starches and grains. Refined grain products, such as white flour, degermed cornmeal, white bread, and white rice. Shopping Choose nutrient-rich snacks, such as vegetables, whole fruits, and nuts. Avoid high-calorie and high-sugar snacks, such as potato chips, fruit snacks, and candy. Use oil-based dressings and spreads on foods instead of solid fats such as butter, stick margarine, or cream cheese. Limit pre-made sauces, mixes, and "instant" products such as flavored rice, instant noodles, and ready-made pasta. Try more plant-protein sources, such  as tofu, tempeh, black beans, edamame, lentils, nuts, and seeds. Explore eating plans such as the Mediterranean diet or vegetarian diet. Cooking Use oil to saut or stir-fry foods instead of solid fats such as butter, stick margarine, or lard. Try  baking, boiling, grilling, or broiling instead of frying. Remove the fatty part of meats before cooking. Steam vegetables in water or broth. Meal planning  At meals, imagine dividing your plate into fourths: One-half of your plate is fruits and vegetables. One-fourth of your plate is whole grains. One-fourth of your plate is protein, especially lean meats, poultry, eggs, tofu, beans, or nuts. Include low-fat dairy as part of your daily diet.  Lifestyle Choose healthy options in all settings, including home, work, school, restaurants, or stores. Prepare your food safely: Wash your hands after handling raw meats. Keep food preparation surfaces clean by regularly washing with hot, soapy water. Keep raw meats separate from ready-to-eat foods, such as fruits and vegetables. Cook seafood, meat, poultry, and eggs to the recommended internal temperature. Store foods at safe temperatures. In general: Keep cold foods at 5F (4.4C) or below. Keep hot foods at 15F (60C) or above. Keep your freezer at [facility] (-17.8C) or below. Foods are no longer safe to eat when they have been between the temperatures of 40-15F (4.4-60C) for more than 2 hours. What foods should I eat? Fruits Aim to eat 2 cup-equivalents of fresh, canned (in natural juice), or frozen fruits each day. Examples of 1 cup-equivalent of fruit include 1 small apple, 8large strawberries, 1 cup canned fruit,  cup dried fruit, or 1 cup 100% juice. Vegetables Aim to eat 2-3 cup-equivalents of fresh and frozen vegetables each day, including different varieties and colors. Examples of 1 cup-equivalent of vegetables include 2 medium carrots, 2 cups raw, leafy greens, 1 cup choppedvegetable (raw or cooked), or 1 medium baked potato. Grains Aim to eat 6 ounce-equivalents of whole grains each day. Examples of 1 ounce-equivalent of grains include 1 slice of bread, 1 cup ready-to-eat cereal,3 cups popcorn, or  cup cooked rice, pasta, or  cereal. Meats and other proteins Aim to eat 5-6 ounce-equivalents of protein each day. Examples of 1 ounce-equivalent of protein include 1 egg, 1/2 cup nuts or seeds, or 1 tablespoon (16 g) peanut butter. A cut of meat or fish that is the size of a deck of cards is about 3-4 ounce-equivalents. Of the protein you eat each week, try to have at least 8 ounces come from seafood. This includes salmon, trout, herring, and anchovies. Dairy Aim to eat 3 cup-equivalents of fat-free or low-fat dairy each day. Examples of 1 cup-equivalent of dairy include 1 cup (240 mL) milk, 8 ounces (250 g) yogurt,1 ounces (44 g) natural cheese, or 1 cup (240 mL) fortified soy milk. Fats and oils Aim for about 5 teaspoons (21 g) per day. Choose monounsaturated fats, such as canola and olive oils, avocados, peanut butter, and most nuts, or polyunsaturated fats, such as sunflower, corn, and soybean oils, walnuts, pine nuts, sesame seeds, sunflower seeds, and flaxseed. Beverages Aim for six 8-oz glasses of water per day. Limit coffee to three to five 8-oz cups per day. Limit caffeinated beverages that have added calories, such as soda and energy drinks. Limit alcohol intake to no more than 1 drink a day for nonpregnant women and 2 drinks a day for men. One drink equals 12 oz of beer (355 mL), 5 oz of wine (148 mL), or 1 oz of hard liquor (44 mL). Seasoning  and other foods Avoid adding excess amounts of salt to your foods. Try flavoring foods with herbs and spices instead of salt. Avoid adding sugar to foods. Try using oil-based dressings, sauces, and spreads instead of solid fats. This information is based on general [place] nutrition guidelines. For more information, visit DisposableNylon.be. Exact amounts may vary based on your nutrition needs. Summary A healthy eating plan may help you to maintain a healthy weight, reduce the risk of chronic diseases, and stay active throughout your life. Plan your meals. Make sure you eat  the right portions of a variety of nutrient-rich foods. Try baking, boiling, grilling, or broiling instead of frying. Choose healthy options in all settings, including home, work, school, restaurants, or stores. This information is not intended to replace advice given to you by your health care provider. Make sure you discuss any questions you have with your healthcare provider. Document Revised: 03/02/2018 Document Reviewed: 03/02/2018 Elsevier Patient Education  2022 [employer].

## 2021-07-09 NOTE — Progress Notes (Signed)
Cardiology Office Note:    Date:  07/09/2021   ID:  TABBETHA KUTSCHER, DOB 04/01/1941, MRN [id number]  PCP:  [name], MD   [facility] Providers Cardiologist:  Debbe Odea, MD     Referring MD: [name], MD   Chief Complaint  Patient presents with   Other    CAD c/o sob with exertion and midsternum pain radiates to left side of back. Meds reviewed verbally with pt.    History of Present Illness:    Linda Brady is a 80 y.o. female with a hx of CAD  (50% prox RCA, 30% LAD), hypertension, former smoker, COPD who presents due to shortness of breath on exertion.  Patient previously seen by [facility] cardiology/[facility], work-up includes echo in 09/2020 showing normal systolic function, normal diastolic function, EF 55 to 60%.  Left heart cath showed 30% LAD disease, 50% RCA disease.  Medically managed with aspirin, Lipitor.  She has noticed worsening shortness of breath over the past several months.  Occasionally she gets anxious, has chest pressure.  States gaining over 35 pounds in the last 2 years since the pandemic.  Has not been able to walk or exercise as she usually does.   Prior notes Echo 09/2020 normal systolic function, EF 55 to 60% Left heart cath 09/2020 mild proximal LAD disease 30%, moderate RCA disease 50%.  Past Medical History:  Diagnosis Date   COPD (chronic obstructive pulmonary disease) (HCC)    Hypertension    Thyroid disease     Past Surgical History:  Procedure Laterality Date   BREAST CYST ASPIRATION Left 10 plus yrs   benign-twice   LEFT HEART CATH AND CORONARY ANGIOGRAPHY N/A 09/11/2020   Procedure: LEFT HEART CATH AND CORONARY ANGIOGRAPHY;  Surgeon: Marcina Millard, MD;  Location: [facility] INVASIVE CV LAB;  Service: Cardiovascular;  Laterality: N/A;    Current Medications: Current Meds  Medication Sig   amoxicillin (AMOXIL) 500 MG tablet Take by mouth in the morning and at bedtime.   aspirin 81 MG EC tablet Take 81  mg by mouth daily.   atorvastatin (LIPITOR) 40 MG tablet Take 1 tablet (40 mg total) by mouth daily.   cholecalciferol (VITAMIN D3) 25 MCG (1000 UNIT) tablet Take 1,000 Units by mouth daily.   clarithromycin (BIAXIN) 500 MG tablet Take by mouth in the morning and at bedtime.   enalapril (VASOTEC) 20 MG tablet Take 20 mg by mouth 2 (two) times daily.   FLUoxetine (PROZAC) 20 MG capsule Take 20 mg by mouth daily.   hydrochlorothiazide (HYDRODIURIL) 25 MG tablet Take 12.5 mg by mouth daily.   hydrOXYzine (ATARAX/VISTARIL) 25 MG tablet Take 25 mg by mouth 3 (three) times daily as needed for anxiety.   levothyroxine (SYNTHROID) 75 MCG tablet Take 75 mcg by mouth daily.   nitroGLYCERIN (NITROSTAT) 0.4 MG SL tablet Place 1 tablet (0.4 mg total) under the tongue every 5 (five) minutes as needed for chest pain. For a maximum of 3 doses.   RABEprazole (ACIPHEX) 20 MG tablet Take 20 mg by mouth daily.   SPIRIVA HANDIHALER 18 MCG inhalation capsule Place 1 capsule into inhaler and inhale daily.   vitamin B-12 (CYANOCOBALAMIN) 1000 MCG tablet Take 1,000 mcg by mouth daily.     Allergies:   Naproxen sodium   Social History   Socioeconomic History   Marital status: Widowed    Spouse name: Not on file   Number of children: Not on file   Years of education: Not  on file   Highest education level: Not on file  Occupational History   Not on file  Tobacco Use   Smoking status: Former    Packs/day: 0.50    Years: 12.00    Pack years: 6.00    Types: Cigarettes   Smokeless tobacco: Never   Tobacco comments:    7-8 cigarettes a day  Vaping Use   Vaping Use: Never used  Substance and Sexual Activity   Alcohol use: Not Currently   Drug use: Not Currently   Sexual activity: Not Currently  Other Topics Concern   Not on file  Social History Narrative   Not on file   Social Determinants of Health   Financial Resource Strain: Not on file  Food Insecurity: Not on file  Transportation Needs: Not on  file  Physical Activity: Not on file  Stress: Not on file  Social Connections: Not on file     Family History: The patient's family history is negative for Breast cancer.  ROS:   Please see the history of present illness.     All other systems reviewed and are negative.  EKGs/Labs/Other Studies Reviewed:    The following studies were reviewed today:   EKG:  EKG is  ordered today.  The ekg ordered today demonstrates normal sinus rhythm, first-degree AV block, possible old septal infarct.  Recent Labs: 09/10/2020: ALT 11; BUN 8; Creatinine, Ser 0.82; Hemoglobin 13.7; Platelets 422; Potassium 3.3; Sodium 132; TSH 1.720 09/11/2020: Magnesium 1.8  Recent Lipid Panel    Component Value Date/Time   CHOL 158 09/10/2020 1452   TRIG 100 09/10/2020 1452   HDL 53 09/10/2020 1452   CHOLHDL 3.0 09/10/2020 1452   VLDL 20 09/10/2020 1452   LDLCALC 85 09/10/2020 1452     Risk Assessment/Calculations:          Physical Exam:    VS:  BP 130/82 (BP Location: Right Arm, Patient Position: Sitting, Cuff Size: Large)   Pulse 94   Ht 5\' 3"  (1.6 m)   Wt 182 lb (82.6 kg)   SpO2 98%   BMI 32.24 kg/m     Wt Readings from Last 3 Encounters:  07/09/21 182 lb (82.6 kg)  09/11/20 170 lb (77.1 kg)     GEN:  Well nourished, well developed in no acute distress HEENT: Normal NECK: No JVD; No carotid bruits LYMPHATICS: No lymphadenopathy CARDIAC: RRR, faint systolic murmur RESPIRATORY:  Clear to auscultation without rales, wheezing or rhonchi  ABDOMEN: Soft, non-tender, non-distended MUSCULOSKELETAL:  No edema; No deformity  SKIN: Warm and dry NEUROLOGIC:  Alert and oriented x 3 PSYCHIATRIC:  Normal affect   ASSESSMENT:    1. Coronary artery disease involving native coronary artery of native heart, unspecified whether angina present   2. Primary hypertension   3. Dyspnea on exertion    PLAN:    In order of problems listed above:  CAD (50% RCA, 30% LAD).  Symptoms of shortness  of breath not likely angina.  Continue aspirin, Lipitor.  Sublingual nitro as needed chest pain.  Echo with preserved ejection fraction, EF 55%. Hypertension, BP controlled.  Continue HCTZ, Vasotec. Shortness of breath, likely from deconditioning versus COPD.  Echo was normal.  Patient counseled regarding weight loss.  If symptoms persist despite conditioning and weight loss, will consider ischemic eval.  Follow-up in 6 months     Medication Adjustments/Labs and Tests Ordered: Current medicines are reviewed at length with the patient today.  Concerns regarding medicines are outlined  above.  Orders Placed This Encounter  Procedures   EKG 12-Lead   Meds ordered this encounter  Medications   nitroGLYCERIN (NITROSTAT) 0.4 MG SL tablet    Sig: Place 1 tablet (0.4 mg total) under the tongue every 5 (five) minutes as needed for chest pain. For a maximum of 3 doses.    Dispense:  90 tablet    Refill:  3    Patient Instructions  Medication Instructions:   Your physician has recommended you make the following change in your medication:    START taking Nitroglycerin 0.4 MG Sublingual (for chest pain)  every 5 mins for a a maximum of 3 doses per day.  *If you need a refill on your cardiac medications before your next appointment, please call your pharmacy*   Lab Work: None ordered If you have labs (blood work) drawn today and your tests are completely normal, you will receive your results only by: MyChart Message (if you have MyChart) OR A paper copy in the mail If you have any lab test that is abnormal or we need to change your treatment, we will call you to review the results.   Testing/Procedures: None ordered   Follow-Up: At [facility], you and your health needs are our priority.  As part of our continuing mission to provide you with exceptional heart care, we have created designated Provider Care Teams.  These Care Teams include your primary Cardiologist (physician) and  Advanced Practice Providers (APPs -  Physician Assistants and Nurse Practitioners) who all work together to provide you with the care you need, when you need it.  We recommend signing up for the patient portal called "MyChart".  Sign up information is provided on this After Visit Summary.  MyChart is used to connect with patients for Virtual Visits (Telemedicine).  Patients are able to view lab/test results, encounter notes, upcoming appointments, etc.  Non-urgent messages can be sent to your provider as well.   To learn more about what you can do with MyChart, go to [email or web address].    Your next appointment:   6 month(s)  The format for your next appointment:   In Person  Provider:   Debbe Odea, MD   Other Instructions  Healthy Eating Following a healthy eating pattern may help you to achieve and maintain a healthy body weight, reduce the risk of chronic disease, and live a long and productive life. It is important to follow a healthy eating pattern at an appropriate calorie level for your body. Your nutritional needs should be metprimarily through food by choosing a variety of nutrient-rich foods. What are tips for following this plan? Reading food labels Read labels and choose the following: Reduced or low sodium. Juices with 100% fruit juice. Foods with low saturated fats and high polyunsaturated and monounsaturated fats. Foods with whole grains, such as whole wheat, cracked wheat, brown rice, and wild rice. Whole grains that are fortified with folic acid. This is recommended for women who are pregnant or who want to become pregnant. Read labels and avoid the following: Foods with a lot of added sugars. These include foods that contain brown sugar, corn sweetener, corn syrup, dextrose, fructose, glucose, high-fructose corn syrup, honey, invert sugar, lactose, malt syrup, maltose, molasses, raw sugar, sucrose, trehalose, or turbinado sugar. Do not eat more than the  following amounts of added sugar per day: 6 teaspoons (25 g) for women. 9 teaspoons (38 g) for men. Foods that contain processed or refined starches and grains. Refined grain  products, such as white flour, degermed cornmeal, white bread, and white rice. Shopping Choose nutrient-rich snacks, such as vegetables, whole fruits, and nuts. Avoid high-calorie and high-sugar snacks, such as potato chips, fruit snacks, and candy. Use oil-based dressings and spreads on foods instead of solid fats such as butter, stick margarine, or cream cheese. Limit pre-made sauces, mixes, and "instant" products such as flavored rice, instant noodles, and ready-made pasta. Try more plant-protein sources, such as tofu, tempeh, black beans, edamame, lentils, nuts, and seeds. Explore eating plans such as the Mediterranean diet or vegetarian diet. Cooking Use oil to saut or stir-fry foods instead of solid fats such as butter, stick margarine, or lard. Try baking, boiling, grilling, or broiling instead of frying. Remove the fatty part of meats before cooking. Steam vegetables in water or broth. Meal planning  At meals, imagine dividing your plate into fourths: One-half of your plate is fruits and vegetables. One-fourth of your plate is whole grains. One-fourth of your plate is protein, especially lean meats, poultry, eggs, tofu, beans, or nuts. Include low-fat dairy as part of your daily diet.  Lifestyle Choose healthy options in all settings, including home, work, school, restaurants, or stores. Prepare your food safely: Wash your hands after handling raw meats. Keep food preparation surfaces clean by regularly washing with hot, soapy water. Keep raw meats separate from ready-to-eat foods, such as fruits and vegetables. Cook seafood, meat, poultry, and eggs to the recommended internal temperature. Store foods at safe temperatures. In general: Keep cold foods at 46F (4.4C) or below. Keep hot foods at 146F  (60C) or above. Keep your freezer at [facility] (-17.8C) or below. Foods are no longer safe to eat when they have been between the temperatures of 40-146F (4.4-60C) for more than 2 hours. What foods should I eat? Fruits Aim to eat 2 cup-equivalents of fresh, canned (in natural juice), or frozen fruits each day. Examples of 1 cup-equivalent of fruit include 1 small apple, 8large strawberries, 1 cup canned fruit,  cup dried fruit, or 1 cup 100% juice. Vegetables Aim to eat 2-3 cup-equivalents of fresh and frozen vegetables each day, including different varieties and colors. Examples of 1 cup-equivalent of vegetables include 2 medium carrots, 2 cups raw, leafy greens, 1 cup choppedvegetable (raw or cooked), or 1 medium baked potato. Grains Aim to eat 6 ounce-equivalents of whole grains each day. Examples of 1 ounce-equivalent of grains include 1 slice of bread, 1 cup ready-to-eat cereal,3 cups popcorn, or  cup cooked rice, pasta, or cereal. Meats and other proteins Aim to eat 5-6 ounce-equivalents of protein each day. Examples of 1 ounce-equivalent of protein include 1 egg, 1/2 cup nuts or seeds, or 1 tablespoon (16 g) peanut butter. A cut of meat or fish that is the size of a deck of cards is about 3-4 ounce-equivalents. Of the protein you eat each week, try to have at least 8 ounces come from seafood. This includes salmon, trout, herring, and anchovies. Dairy Aim to eat 3 cup-equivalents of fat-free or low-fat dairy each day. Examples of 1 cup-equivalent of dairy include 1 cup (240 mL) milk, 8 ounces (250 g) yogurt,1 ounces (44 g) natural cheese, or 1 cup (240 mL) fortified soy milk. Fats and oils Aim for about 5 teaspoons (21 g) per day. Choose monounsaturated fats, such as canola and olive oils, avocados, peanut butter, and most nuts, or polyunsaturated fats, such as sunflower, corn, and soybean oils, walnuts, pine nuts, sesame seeds, sunflower seeds, and flaxseed. Beverages Aim for  six 8-oz  glasses of water per day. Limit coffee to three to five 8-oz cups per day. Limit caffeinated beverages that have added calories, such as soda and energy drinks. Limit alcohol intake to no more than 1 drink a day for nonpregnant women and 2 drinks a day for men. One drink equals 12 oz of beer (355 mL), 5 oz of wine (148 mL), or 1 oz of hard liquor (44 mL). Seasoning and other foods Avoid adding excess amounts of salt to your foods. Try flavoring foods with herbs and spices instead of salt. Avoid adding sugar to foods. Try using oil-based dressings, sauces, and spreads instead of solid fats. This information is based on general [place] nutrition guidelines. For more information, visit DisposableNylon.be. Exact amounts may vary based on your nutrition needs. Summary A healthy eating plan may help you to maintain a healthy weight, reduce the risk of chronic diseases, and stay active throughout your life. Plan your meals. Make sure you eat the right portions of a variety of nutrient-rich foods. Try baking, boiling, grilling, or broiling instead of frying. Choose healthy options in all settings, including home, work, school, restaurants, or stores. This information is not intended to replace advice given to you by your health care provider. Make sure you discuss any questions you have with your healthcare provider. Document Revised: 03/02/2018 Document Reviewed: 03/02/2018 Elsevier Patient Education  2022 [employer].    Signed, Debbe Odea, MD  07/09/2021 4:39 PM    [facility]

## 2021-12-14 ENCOUNTER — Encounter: Payer: Self-pay | Admitting: Emergency Medicine

## 2021-12-14 ENCOUNTER — Emergency Department: Payer: Medicare Other

## 2021-12-14 ENCOUNTER — Other Ambulatory Visit: Payer: Self-pay

## 2021-12-14 ENCOUNTER — Inpatient Hospital Stay
Admission: EM | Admit: 2021-12-14 | Discharge: 2021-12-16 | DRG: 191 | Disposition: A | Discharge status: Home / Self Care | Payer: Medicare Other | Attending: Internal Medicine | Admitting: Internal Medicine

## 2021-12-14 DIAGNOSIS — R072 Precordial pain: Secondary | ICD-10-CM | POA: Diagnosis not present

## 2021-12-14 DIAGNOSIS — I252 Old myocardial infarction: Secondary | ICD-10-CM | POA: Diagnosis not present

## 2021-12-14 DIAGNOSIS — F40240 Claustrophobia: Secondary | ICD-10-CM | POA: Diagnosis present

## 2021-12-14 DIAGNOSIS — K219 Gastro-esophageal reflux disease without esophagitis: Secondary | ICD-10-CM | POA: Diagnosis present

## 2021-12-14 DIAGNOSIS — I350 Nonrheumatic aortic (valve) stenosis: Secondary | ICD-10-CM

## 2021-12-14 DIAGNOSIS — E861 Hypovolemia: Secondary | ICD-10-CM | POA: Diagnosis present

## 2021-12-14 DIAGNOSIS — I2510 Atherosclerotic heart disease of native coronary artery without angina pectoris: Secondary | ICD-10-CM | POA: Diagnosis present

## 2021-12-14 DIAGNOSIS — I459 Conduction disorder, unspecified: Secondary | ICD-10-CM | POA: Diagnosis present

## 2021-12-14 DIAGNOSIS — E538 Deficiency of other specified B group vitamins: Secondary | ICD-10-CM | POA: Diagnosis present

## 2021-12-14 DIAGNOSIS — I248 Other forms of acute ischemic heart disease: Secondary | ICD-10-CM | POA: Diagnosis present

## 2021-12-14 DIAGNOSIS — Z87891 Personal history of nicotine dependence: Secondary | ICD-10-CM

## 2021-12-14 DIAGNOSIS — I10 Essential (primary) hypertension: Secondary | ICD-10-CM | POA: Diagnosis present

## 2021-12-14 DIAGNOSIS — E785 Hyperlipidemia, unspecified: Secondary | ICD-10-CM | POA: Diagnosis present

## 2021-12-14 DIAGNOSIS — F419 Anxiety disorder, unspecified: Secondary | ICD-10-CM | POA: Diagnosis present

## 2021-12-14 DIAGNOSIS — D75839 Thrombocytosis, unspecified: Secondary | ICD-10-CM | POA: Diagnosis present

## 2021-12-14 DIAGNOSIS — Z20822 Contact with and (suspected) exposure to covid-19: Secondary | ICD-10-CM | POA: Diagnosis present

## 2021-12-14 DIAGNOSIS — R7989 Other specified abnormal findings of blood chemistry: Secondary | ICD-10-CM | POA: Diagnosis not present

## 2021-12-14 DIAGNOSIS — Z886 Allergy status to analgesic agent status: Secondary | ICD-10-CM

## 2021-12-14 DIAGNOSIS — R079 Chest pain, unspecified: Secondary | ICD-10-CM | POA: Diagnosis present

## 2021-12-14 DIAGNOSIS — Z7982 Long term (current) use of aspirin: Secondary | ICD-10-CM | POA: Diagnosis not present

## 2021-12-14 DIAGNOSIS — R778 Other specified abnormalities of plasma proteins: Secondary | ICD-10-CM | POA: Diagnosis not present

## 2021-12-14 DIAGNOSIS — E039 Hypothyroidism, unspecified: Secondary | ICD-10-CM | POA: Diagnosis present

## 2021-12-14 DIAGNOSIS — I5032 Chronic diastolic (congestive) heart failure: Secondary | ICD-10-CM | POA: Diagnosis present

## 2021-12-14 DIAGNOSIS — R0609 Other forms of dyspnea: Secondary | ICD-10-CM | POA: Diagnosis not present

## 2021-12-14 DIAGNOSIS — E878 Other disorders of electrolyte and fluid balance, not elsewhere classified: Secondary | ICD-10-CM | POA: Diagnosis present

## 2021-12-14 DIAGNOSIS — Z79899 Other long term (current) drug therapy: Secondary | ICD-10-CM

## 2021-12-14 DIAGNOSIS — E871 Hypo-osmolality and hyponatremia: Secondary | ICD-10-CM | POA: Diagnosis present

## 2021-12-14 DIAGNOSIS — J449 Chronic obstructive pulmonary disease, unspecified: Secondary | ICD-10-CM | POA: Diagnosis present

## 2021-12-14 DIAGNOSIS — J441 Chronic obstructive pulmonary disease with (acute) exacerbation: Principal | ICD-10-CM | POA: Diagnosis present

## 2021-12-14 DIAGNOSIS — I110 Hypertensive heart disease with heart failure: Secondary | ICD-10-CM | POA: Diagnosis present

## 2021-12-14 DIAGNOSIS — E876 Hypokalemia: Secondary | ICD-10-CM | POA: Diagnosis present

## 2021-12-14 LAB — BASIC METABOLIC PANEL
Anion gap: 11 (ref 5–15)
BUN: 11 mg/dL (ref 8–23)
CO2: 28 mmol/L (ref 22–32)
Calcium: 9.7 mg/dL (ref 8.9–10.3)
Chloride: 91 mmol/L — ABNORMAL LOW (ref 98–111)
Creatinine, Ser: 0.87 mg/dL (ref 0.44–1.00)
GFR, Estimated: >60 mL/min (ref >60)
Glucose, Bld: 130 mg/dL — ABNORMAL HIGH (ref 70–99)
Potassium: 3.1 mmol/L — ABNORMAL LOW (ref 3.5–5.1)
Sodium: 130 mmol/L — ABNORMAL LOW (ref 135–145)

## 2021-12-14 LAB — URINALYSIS, ROUTINE W REFLEX MICROSCOPIC
Bacteria, UA: NONE SEEN
Bilirubin Urine: NEGATIVE
Glucose, UA: NEGATIVE mg/dL
Ketones, ur: NEGATIVE mg/dL
Leukocytes,Ua: NEGATIVE
Nitrite: NEGATIVE
Protein, ur: NEGATIVE mg/dL
Specific Gravity, Urine: 1.008 (ref 1.005–1.030)
pH: 7 (ref 5.0–8.0)

## 2021-12-14 LAB — CBC
HCT: 41.4 % (ref 36.0–46.0)
Hemoglobin: 13.9 g/dL (ref 12.0–15.0)
MCH: 31.2 pg (ref 26.0–34.0)
MCHC: 33.6 g/dL (ref 30.0–36.0)
MCV: 93 fL (ref 80.0–100.0)
Platelets: 424 10*3/uL — ABNORMAL HIGH (ref 150–400)
RBC: 4.45 MIL/uL (ref 3.87–5.11)
RDW: 12.8 % (ref 11.5–15.5)
WBC: 6.8 10*3/uL (ref 4.0–10.5)
nRBC: 0 % (ref 0.0–0.2)

## 2021-12-14 LAB — HEPATIC FUNCTION PANEL
ALT: 13 U/L (ref 0–44)
AST: 22 U/L (ref 15–41)
Albumin: 3.7 g/dL (ref 3.5–5.0)
Alkaline Phosphatase: 81 U/L (ref 38–126)
Bilirubin, Direct: <0.1 mg/dL (ref 0.0–0.2)
Total Bilirubin: 0.4 mg/dL (ref 0.3–1.2)
Total Protein: 7.4 g/dL (ref 6.5–8.1)

## 2021-12-14 LAB — TROPONIN I (HIGH SENSITIVITY)
Troponin I (High Sensitivity): 5 ng/L (ref <18)
Troponin I (High Sensitivity): 9 ng/L (ref <18)
Troponin I (High Sensitivity): 7 ng/L (ref <18)
Troponin I (High Sensitivity): 47 ng/L — ABNORMAL HIGH (ref <18)
Troponin I (High Sensitivity): 56 ng/L — ABNORMAL HIGH (ref <18)

## 2021-12-14 LAB — MAGNESIUM: Magnesium: 1.9 mg/dL (ref 1.7–2.4)

## 2021-12-14 LAB — LIPASE, BLOOD: Lipase: 33 U/L (ref 11–51)

## 2021-12-14 LAB — RESP PANEL BY RT-PCR (FLU A&B, COVID) ARPGX2
Influenza A by PCR: NEGATIVE
Influenza B by PCR: NEGATIVE
SARS Coronavirus 2 by RT PCR: NEGATIVE

## 2021-12-14 MED ORDER — ASPIRIN EC 81 MG PO TBEC
81.0000 mg | DELAYED_RELEASE_TABLET | Freq: Every day | ORAL | Status: DC
  Administered 2021-12-14 – 2021-12-16 (×3): 81 mg via ORAL
  Filled 2021-12-14 (×3): qty 1

## 2021-12-14 MED ORDER — NITROGLYCERIN 0.4 MG SL SUBL: 0.4000 mg | SUBLINGUAL_TABLET | SUBLINGUAL | Status: DC | PRN

## 2021-12-14 MED ORDER — ENALAPRIL MALEATE 20 MG PO TABS
20.0000 mg | ORAL_TABLET | Freq: Two times a day (BID) | ORAL | Status: DC
  Administered 2021-12-14 – 2021-12-16 (×5): 20 mg via ORAL
  Filled 2021-12-14: qty 1
  Filled 2021-12-14: qty 2
  Filled 2021-12-14: qty 1
  Filled 2021-12-14 (×2): qty 2
  Filled 2021-12-14: qty 1

## 2021-12-14 MED ORDER — POTASSIUM CHLORIDE IN NACL 20-0.9 MEQ/L-% IV SOLN
INTRAVENOUS | Status: DC
  Filled 2021-12-14 (×2): qty 1000

## 2021-12-14 MED ORDER — MORPHINE SULFATE (PF) 2 MG/ML IV SOLN
2.0000 mg | INTRAVENOUS | Status: DC | PRN
  Administered 2021-12-14 (×2): 2 mg via INTRAVENOUS
  Filled 2021-12-14 (×2): qty 1

## 2021-12-14 MED ORDER — ALBUTEROL SULFATE (2.5 MG/3ML) 0.083% IN NEBU: 2.5000 mg | INHALATION_SOLUTION | RESPIRATORY_TRACT | Status: DC | PRN

## 2021-12-14 MED ORDER — HYDROXYZINE HCL 25 MG PO TABS
25.0000 mg | ORAL_TABLET | Freq: Once | ORAL | Status: AC
  Administered 2021-12-14: 25 mg via ORAL

## 2021-12-14 MED ORDER — MAGNESIUM HYDROXIDE 400 MG/5ML PO SUSP: 30.0000 mL | Freq: Every day | ORAL | Status: DC | PRN

## 2021-12-14 MED ORDER — LEVOTHYROXINE SODIUM 75 MCG PO TABS
75.0000 ug | ORAL_TABLET | Freq: Every day | ORAL | Status: DC
  Administered 2021-12-15 – 2021-12-16 (×2): 75 ug via ORAL
  Filled 2021-12-14 (×2): qty 1

## 2021-12-14 MED ORDER — ACETAMINOPHEN 650 MG RE SUPP: 650.0000 mg | Freq: Four times a day (QID) | RECTAL | Status: DC | PRN

## 2021-12-14 MED ORDER — AZITHROMYCIN 250 MG PO TABS
250.0000 mg | ORAL_TABLET | Freq: Every day | ORAL | Status: DC
  Administered 2021-12-15 – 2021-12-16 (×2): 250 mg via ORAL
  Filled 2021-12-14 (×2): qty 1

## 2021-12-14 MED ORDER — SODIUM CHLORIDE 0.9 % IV SOLN
1.0000 g | INTRAVENOUS | Status: DC
  Administered 2021-12-14: 1 g via INTRAVENOUS
  Filled 2021-12-14: qty 10

## 2021-12-14 MED ORDER — POTASSIUM CHLORIDE 20 MEQ PO PACK
40.0000 meq | PACK | Freq: Once | ORAL | Status: AC
Start: 2021-12-14 — End: 2021-12-14
  Administered 2021-12-14: 40 meq via ORAL
  Filled 2021-12-14: qty 2

## 2021-12-14 MED ORDER — METHYLPREDNISOLONE SODIUM SUCC 125 MG IJ SOLR
125.0000 mg | Freq: Once | INTRAMUSCULAR | Status: AC
  Administered 2021-12-14: 125 mg via INTRAVENOUS
  Filled 2021-12-14: qty 2

## 2021-12-14 MED ORDER — FLUOXETINE HCL 20 MG PO CAPS
20.0000 mg | ORAL_CAPSULE | Freq: Every day | ORAL | Status: DC
  Administered 2021-12-14 – 2021-12-16 (×3): 20 mg via ORAL
  Filled 2021-12-14 (×3): qty 1

## 2021-12-14 MED ORDER — VITAMIN B-12 1000 MCG PO TABS
1000.0000 ug | ORAL_TABLET | Freq: Every day | ORAL | Status: DC
  Administered 2021-12-14 – 2021-12-16 (×3): 1000 ug via ORAL
  Filled 2021-12-14 (×3): qty 1

## 2021-12-14 MED ORDER — ATORVASTATIN CALCIUM 20 MG PO TABS: 40.0000 mg | ORAL_TABLET | Freq: Every day | ORAL | Status: DC

## 2021-12-14 MED ORDER — ONDANSETRON HCL 4 MG PO TABS: 4.0000 mg | ORAL_TABLET | Freq: Four times a day (QID) | ORAL | Status: DC | PRN

## 2021-12-14 MED ORDER — ACETAMINOPHEN 325 MG PO TABS
650.0000 mg | ORAL_TABLET | Freq: Four times a day (QID) | ORAL | Status: DC | PRN
  Administered 2021-12-14 – 2021-12-15 (×3): 650 mg via ORAL
  Filled 2021-12-14 (×3): qty 2

## 2021-12-14 MED ORDER — BUDESONIDE 0.25 MG/2ML IN SUSP
0.2500 mg | Freq: Two times a day (BID) | RESPIRATORY_TRACT | Status: DC
  Administered 2021-12-14 – 2021-12-16 (×4): 0.25 mg via RESPIRATORY_TRACT
  Filled 2021-12-14 (×4): qty 2

## 2021-12-14 MED ORDER — IPRATROPIUM-ALBUTEROL 0.5-2.5 (3) MG/3ML IN SOLN
3.0000 mL | RESPIRATORY_TRACT | Status: AC
  Administered 2021-12-14 (×3): 3 mL via RESPIRATORY_TRACT
  Filled 2021-12-14: qty 3
  Filled 2021-12-14: qty 6

## 2021-12-14 MED ORDER — HYDROXYZINE HCL 25 MG PO TABS
25.0000 mg | ORAL_TABLET | Freq: Three times a day (TID) | ORAL | Status: DC | PRN
  Administered 2021-12-14 – 2021-12-15 (×4): 25 mg via ORAL
  Filled 2021-12-14 (×5): qty 1

## 2021-12-14 MED ORDER — HYDROCHLOROTHIAZIDE 12.5 MG PO TABS
12.5000 mg | ORAL_TABLET | Freq: Every day | ORAL | Status: DC
  Administered 2021-12-14 – 2021-12-16 (×3): 12.5 mg via ORAL
  Filled 2021-12-14 (×3): qty 1

## 2021-12-14 MED ORDER — TIOTROPIUM BROMIDE MONOHYDRATE 18 MCG IN CAPS
1.0000 | ORAL_CAPSULE | Freq: Every day | RESPIRATORY_TRACT | Status: DC
  Filled 2021-12-14: qty 5

## 2021-12-14 MED ORDER — VITAMIN D 25 MCG (1000 UNIT) PO TABS
1000.0000 [IU] | ORAL_TABLET | Freq: Every day | ORAL | Status: DC
  Administered 2021-12-14 – 2021-12-16 (×3): 1000 [IU] via ORAL
  Filled 2021-12-14 (×3): qty 1

## 2021-12-14 MED ORDER — PANTOPRAZOLE SODIUM 40 MG PO TBEC
40.0000 mg | DELAYED_RELEASE_TABLET | Freq: Every day | ORAL | Status: DC
  Administered 2021-12-14 – 2021-12-16 (×3): 40 mg via ORAL
  Filled 2021-12-14 (×3): qty 1

## 2021-12-14 MED ORDER — TRAZODONE HCL 50 MG PO TABS
25.0000 mg | ORAL_TABLET | Freq: Every evening | ORAL | Status: DC | PRN
  Administered 2021-12-15: 25 mg via ORAL
  Filled 2021-12-14: qty 1

## 2021-12-14 MED ORDER — SIMVASTATIN 20 MG PO TABS
10.0000 mg | ORAL_TABLET | Freq: Every day | ORAL | Status: DC
  Administered 2021-12-14 – 2021-12-15 (×2): 10 mg via ORAL
  Filled 2021-12-14 (×2): qty 1

## 2021-12-14 MED ORDER — IPRATROPIUM-ALBUTEROL 0.5-2.5 (3) MG/3ML IN SOLN
3.0000 mL | Freq: Four times a day (QID) | RESPIRATORY_TRACT | Status: DC
  Administered 2021-12-14 – 2021-12-16 (×8): 3 mL via RESPIRATORY_TRACT
  Filled 2021-12-14 (×8): qty 3

## 2021-12-14 MED ORDER — AZITHROMYCIN 500 MG PO TABS
500.0000 mg | ORAL_TABLET | Freq: Every day | ORAL | Status: AC
  Administered 2021-12-14: 500 mg via ORAL
  Filled 2021-12-14: qty 1

## 2021-12-14 MED ORDER — ONDANSETRON HCL 4 MG/2ML IJ SOLN: 4.0000 mg | Freq: Four times a day (QID) | INTRAMUSCULAR | Status: DC | PRN

## 2021-12-14 MED ORDER — ENOXAPARIN SODIUM 40 MG/0.4ML IJ SOSY
40.0000 mg | PREFILLED_SYRINGE | INTRAMUSCULAR | Status: DC
  Administered 2021-12-15 – 2021-12-16 (×2): 40 mg via SUBCUTANEOUS
  Filled 2021-12-14 (×3): qty 0.4

## 2021-12-14 MED ORDER — GUAIFENESIN ER 600 MG PO TB12
600.0000 mg | ORAL_TABLET | Freq: Two times a day (BID) | ORAL | Status: DC
  Administered 2021-12-14 – 2021-12-16 (×5): 600 mg via ORAL
  Filled 2021-12-14 (×5): qty 1

## 2021-12-14 NOTE — Assessment & Plan Note (Addendum)
Reported on admission.  Patient follows with [facility] cardiology.  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 Troponins initially negative, now rising and mildly elevated. EKG changes appear chronic. Chest pain improving with treatment of COPD / nebs. --Repeat troponins and stat EKG if recurrent chest pain -- Continue aspirin and statin --Cardiology consulted --Imdur was added -- As needed morphine and sublingual nitroglycerin --Follow up repeat Echo --Outpatient cardiology follow up

## 2021-12-14 NOTE — ED Provider Notes (Signed)
[facility] Provider Note    Event Date/Time   First MD Initiated Contact with Patient 12/14/21 [phone]     (approximate)   History   Chest Pain   HPI  Linda Brady is a 81 y.o. female history of hypertension, COPD who presents to the emergency department complaints of diffuse chest tightness that radiates into her left arm, shortness of breath, wheezing that started tonight.  No fevers or cough.  States she is now starting to have pain all over that feels like a burning and tingling.  No vomiting or diarrhea.  Patient received 2 breathing treatments with EMS which she states helped her symptoms slightly.  Continue to have chest tightness and shortness of breath.   History provided by patient and EMS.      Past Medical History:  Diagnosis Date   COPD (chronic obstructive pulmonary disease) (HCC)    Hypertension    Thyroid disease     Past Surgical History:  Procedure Laterality Date   BREAST CYST ASPIRATION Left 10 plus yrs   benign-twice   LEFT HEART CATH AND CORONARY ANGIOGRAPHY N/A 09/11/2020   Procedure: LEFT HEART CATH AND CORONARY ANGIOGRAPHY;  Surgeon: Marcina MillardParaschos, Alexander, MD;  Location: [facility] INVASIVE CV LAB;  Service: Cardiovascular;  Laterality: N/A;    MEDICATIONS:  Prior to Admission medications   Medication Sig Start Date End Date Taking? Authorizing Provider  aspirin 81 MG EC tablet Take 81 mg by mouth daily.    [provider]  atorvastatin (LIPITOR) 40 MG tablet Take 1 tablet (40 mg total) by mouth daily. 09/11/20   Arnetha CourserAmin, Sumayya, MD  cholecalciferol (VITAMIN D3) 25 MCG (1000 UNIT) tablet Take 1,000 Units by mouth daily.    [provider]  dicyclomine (BENTYL) 10 MG capsule Take 10 mg by mouth 4 (four) times daily as needed. Patient not taking: Reported on 07/09/2021 05/18/20   [provider]  enalapril (VASOTEC) 20 MG tablet Take 20 mg by mouth 2 (two) times daily. 04/13/20   [provider]   FLUoxetine (PROZAC) 20 MG capsule Take 20 mg by mouth daily. 03/14/20 07/09/21  [provider]  hydrochlorothiazide (HYDRODIURIL) 25 MG tablet Take 12.5 mg by mouth daily. 03/14/20   [provider]  hydrOXYzine (ATARAX/VISTARIL) 25 MG tablet Take 25 mg by mouth 3 (three) times daily as needed for anxiety. 12/13/19   [provider]  levothyroxine (SYNTHROID) 75 MCG tablet Take 75 mcg by mouth daily. 05/02/20   [provider]  nitroGLYCERIN (NITROSTAT) 0.4 MG SL tablet Place 1 tablet (0.4 mg total) under the tongue every 5 (five) minutes as needed for chest pain. For a maximum of 3 doses. 07/09/21 10/07/21  Debbe OdeaAgbor-Etang, Brian, MD  RABEprazole (ACIPHEX) 20 MG tablet Take 20 mg by mouth daily. 04/13/20   [provider]  SPIRIVA HANDIHALER 18 MCG inhalation capsule Place 1 capsule into inhaler and inhale daily. 08/22/20   [provider]  vitamin B-12 (CYANOCOBALAMIN) 1000 MCG tablet Take 1,000 mcg by mouth daily.    [provider]    Physical Exam   Triage Vital Signs: ED Triage Vitals  Enc Vitals Group     BP 12/14/21 0225 (!) 167/75     Pulse Rate 12/14/21 0225 87     Resp 12/14/21 0225 16     Temp 12/14/21 0225 97.6 F (36.4 C)     Temp Source 12/14/21 0225 Oral     SpO2 12/14/21 0225 94 %  Weight 12/14/21 0218 180 lb (81.6 kg)     Height 12/14/21 0218 5\' 3"  (1.6 m)     Head Circumference --      Peak Flow --      Pain Score 12/14/21 0217 5     Pain Loc --      Pain Edu? --      Excl. in GC? --     Most recent vital signs: Vitals:   12/14/21 0225 12/14/21 0453  BP: (!) 167/75 (!) 157/75  Pulse: 87 86  Resp: 16 18  Temp: 97.6 F (36.4 C)   SpO2: 94% 95%    CONSTITUTIONAL: Alert and oriented and responds appropriately to questions.  Elderly HEAD: Normocephalic, atraumatic EYES: Conjunctivae clear, pupils appear equal, sclera nonicteric ENT: normal nose; moist mucous membranes NECK: Supple, normal ROM CARD: RRR;  S1 and S2 appreciated; no murmurs, no clicks, no rubs, no gallops RESP: Patient is tachypneic and speaking truncated sentences.  She is very diminished at her bases and has inspiratory and expiratory wheezing.  No rhonchi or rales. ABD/GI: Normal bowel sounds; non-distended; soft, non-tender, no rebound, no guarding, no peritoneal signs BACK: The back appears normal EXT: Normal ROM in all joints; no deformity noted, no edema; no cyanosis, no calf tenderness or swelling SKIN: Normal color for age and race; warm; no rash on exposed skin NEURO: Moves all extremities equally, normal speech PSYCH: The patient's mood and manner are appropriate.   ED Results / Procedures / Treatments   LABS: (all labs ordered are listed, but only abnormal results are displayed) Labs Reviewed  BASIC METABOLIC PANEL - Abnormal; Notable for the following components:      Result Value   Sodium 130 (*)    Potassium 3.1 (*)    Chloride 91 (*)    Glucose, Bld 130 (*)    All other components within normal limits  CBC - Abnormal; Notable for the following components:   Platelets 424 (*)    All other components within normal limits  RESP PANEL BY RT-PCR (FLU A&B, COVID) ARPGX2  HEPATIC FUNCTION PANEL  LIPASE, BLOOD  URINALYSIS, ROUTINE W REFLEX MICROSCOPIC  TROPONIN I (HIGH SENSITIVITY)  TROPONIN I (HIGH SENSITIVITY)     EKG:  EKG Interpretation  Date/Time:  Friday December 14 2021 02:21:30 EST Ventricular Rate:  85 PR Interval:  204 QRS Duration: 92 QT Interval:  384 QTC Calculation: 456 R Axis:   -57 Text Interpretation: Normal sinus rhythm Left axis deviation RSR' or QR pattern in V1 suggests right ventricular conduction delay Anteroseptal infarct (cited on or before 11-Sep-2020) Abnormal ECG When compared with ECG of 11-Sep-2020 02:19, No significant change was found Confirmed by 13-Sep-2020 [phone]) on 12/14/2021 5:54:11 AM         RADIOLOGY: My personal review and interpretation of imaging:  Chest x-ray clear.  I have personally reviewed all radiology reports.   DG Chest 2 View  Result Date: 12/14/2021 CLINICAL DATA:  Chest pain. EXAM: CHEST - 2 VIEW COMPARISON:  Chest radiograph dated 09/10/2020. FINDINGS: Background of emphysema and chronic interstitial coarsening. Bibasilar linear atelectasis/scarring. No focal consolidation, pleural effusion or pneumothorax. The cardiac silhouette is within normal limits. Atherosclerotic calcification of the aorta. No acute osseous pathology. IMPRESSION: No active cardiopulmonary disease. Electronically Signed   By: 11/10/2020 M.D.   On: 12/14/2021 02:53     PROCEDURES:  Critical Care performed: No      .1-3 Lead EKG Interpretation Performed by: Rambo Sarafian, 12/16/2021, DO  Authorized by: Harbour Nordmeyer, Layla Maw, DO     Interpretation: normal     ECG rate:  80   ECG rate assessment: normal     Rhythm: sinus rhythm     Ectopy: none     Conduction: normal      IMPRESSION / MDM / ASSESSMENT AND PLAN / ED COURSE  I reviewed the triage vital signs and the nursing notes.    Patient here with chest pain and shortness of breath.  She is wheezing.  The patient is on the cardiac monitor to evaluate for evidence of arrhythmia and/or significant heart rate changes.   DIFFERENTIAL DIAGNOSIS (includes but not limited to):   COPD, CHF, PE, ACS, pneumothorax, pneumonia, COVID-19, influenza, dissection   PLAN: Cardiac labs, EKG, chest x-ray, duo nebs, Solu-Medrol, likely admit.   MEDICATIONS GIVEN IN ED: Medications  ipratropium-albuterol (DUONEB) 0.5-2.5 (3) MG/3ML nebulizer solution 3 mL (has no administration in time range)  methylPREDNISolone sodium succinate (SOLU-MEDROL) 125 mg/2 mL injection 125 mg (has no administration in time range)     ED COURSE: Patient has improvement after breathing treatments with EMS but is still tight and wheezing.  She is speaking truncated sentences but is not hypoxic.  We will continue duo nebs and give IV  Solu-Medrol.  Patient's labs are reassuring.  Troponin x2 negative.  Chest x-ray reviewed by myself and radiology and shows no infiltrate, edema, pneumothorax.  CONSULTS: Consulted and discussed patient's case with hospitalist, Dr. Arville Care.  I have recommended admission and consulting physician agrees and will place admission orders.  Patient (and family if present) agree with this plan.   I reviewed all nursing notes, vitals, pertinent previous records.  All labs, EKGs, imaging ordered have been independently reviewed and interpreted by myself.    OUTSIDE RECORDS REVIEWED: Reviewed last cardiology note from 07/09/2021.  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         FINAL CLINICAL IMPRESSION(S) / ED DIAGNOSES   Final diagnoses:  COPD with acute exacerbation (HCC)  Nonspecific chest pain     Rx / DC Orders   ED Discharge Orders     None        Note:  This document was prepared using Dragon voice recognition software and may include unintentional dictation errors.   Meilani Edmundson, Layla Maw, DO 12/14/21 [phone]

## 2021-12-14 NOTE — Progress Notes (Signed)
Progress Note   Patient: Linda Brady YFV:[id number] DOB: [birth year] DOA: 12/14/2021     0 DOS: the patient was seen and examined on 12/14/2021   Brief hospital course:  1/13 -- Pt admitted after midnight with acute exacerbation of COPD.  Started on IV steroids, nebs and antibiotics.  Same day of admission rounding note  Assessment and Plan * COPD with acute exacerbation (HCC)- (present on admission) Present on admission with worsening dyspnea, dry cough, wheezing in addition to chest discomfort.  Very poor aeration and diffuse expiratory wheezing with conversational dyspnea was noted on admission exam. -- Continue IV steroids --Stop Rocephin, no fevers no leukocytosis no pneumonia on chest x-ray -- Oral Zithromax for anti-inflammatory property and possible bronchitis  -- Scheduled DuoNebs 4 times daily --As needed albuterol nebs --Hold Spiriva while on scheduled DuoNebs --Pulmicort nebs (sub for home Advair) -- Scheduled Mucinex --Monitor oxygen sats and supplement if less than 88% on room air  COPD (chronic obstructive pulmonary disease) (HCC)- (present on admission) Currently with acute exacerbation.  Management as outlined above. Not on home oxygen.  Chest pain- (present on admission) Reported on admission.  Patient follows with [facility] cardiology.  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 Troponins are negative. EKG changes appear chronic. --Repeat troponins and stat EKG if recurrent chest pain -- Continue aspirin and statin -- As needed morphine and sublingual nitroglycerin -- Check a repeat echo and consider cardiology consult  -- Telemetry  Hyponatremia- (present on admission) Present on admission with sodium level 130. -- Continue normal saline with K additive -- Monitor BMP  Hypokalemia- (present on admission) Present on admission with K3.1.  Potassium is being replaced with IV fluid  additive. --Monitor and replace as needed  Essential hypertension- (present on admission) BP elevated on admission and 160s systolic. -- Continued on home enalapril -- HCTZ was started on admission -- Titrate antihypertensives  Dyslipidemia- (present on admission) Continue simvastatin  Vitamin B12 deficiency- (present on admission) Continue B12 supplement  GERD (gastroesophageal reflux disease)- (present on admission) Continue PPI  Hypothyroidism- (present on admission) Continue levothyroxine     Subjective: Patient seen in ED hall bed with daughter at bedside, holding for a room.  She reports feeling only minimally better than on admission.  Still feels short of breath but not as severely.  Continues to have some chest discomfort that is worse with cough.  No dizziness or lightheadedness, no palpitations.  Objective Vitals reviewed and notable for hypertension with BP 167/75, 157/75, 166/82.  O2 sats in mid upper 90s on room air.  General exam: awake, alert, no acute distress Respiratory system: Very poor air movement, mild conversational dyspnea, no audible expiratory wheezing at the time, on room air. Cardiovascular system: normal S1/S2, RRR, no pedal edema.   Central nervous system: A&O x3. no gross focal neurologic deficits, normal speech Psychiatry: normal mood, congruent affect, judgement and insight appear normal   Data Reviewed:  Admission labs reviewed and notable for sodium 130, potassium 31, chloride 91, glucose 130, troponins normal (5, 9, 7), CBC notable only for thrombocytosis with platelets of 424, negative for influenza AB and COVID-19, UA with small hemoglobin but no signs of infection.  Chest x-ray negative.    Family Communication: Daughter at bedside during rounds  Disposition: Status is: Inpatient  Remains inpatient appropriate because: Severity of illness as outlined above, on IV therapies, ongoing evaluation of chest pain  No  charge  Author: Pennie Banter, DO 12/14/2021 1:53 PM  For on call review [email or web address].

## 2021-12-14 NOTE — ED Notes (Signed)
Lab at bedside to obtain bloodwork

## 2021-12-14 NOTE — Assessment & Plan Note (Signed)
Currently with acute exacerbation.  Management as outlined above. Not on home oxygen.

## 2021-12-14 NOTE — Assessment & Plan Note (Addendum)
Present on admission with sodium level 130. -- Continue NS -- Monitor BMP

## 2021-12-14 NOTE — Assessment & Plan Note (Signed)
BP elevated on admission and 160s systolic. -- Continued on home enalapril -- HCTZ was started on admission -- Titrate antihypertensives

## 2021-12-14 NOTE — Assessment & Plan Note (Signed)
Continue PPI ?

## 2021-12-14 NOTE — Assessment & Plan Note (Addendum)
Resolved. Present on admission K 3.1.  Potassium was replaced with IV fluid additive. --Change fluids to NS without K --Monitor and replace as needed

## 2021-12-14 NOTE — ED Notes (Signed)
Pt stated she is feeling anxious. Pt given anxiety medication per request.

## 2021-12-14 NOTE — Progress Notes (Signed)
Pt requesting something for anxiety, secure chat message sent to [employer], np

## 2021-12-14 NOTE — Assessment & Plan Note (Signed)
Continue B12 supplement 

## 2021-12-14 NOTE — Assessment & Plan Note (Addendum)
Present on admission with worsening dyspnea, dry cough, wheezing in addition to chest discomfort.  Very poor aeration and diffuse expiratory wheezing with conversational dyspnea was noted on admission exam. -- Continue IV steroids -- Oral Zithromax for anti-inflammatory property and possible bronchitis  -- Scheduled DuoNebs 4 times daily --As needed albuterol nebs --Hold Spiriva while on scheduled DuoNebs --Pulmicort nebs (sub for home Advair) -- Scheduled Mucinex --Monitor oxygen sats and supplement if less than 88% on room air 1/14: now with oxygen 2 L/min, initially on room air

## 2021-12-14 NOTE — Assessment & Plan Note (Signed)
Continue levothyroxine 

## 2021-12-14 NOTE — H&P (Signed)
[place]   PATIENT NAME: Linda Brady    MR#:  [id number]  DATE OF BIRTH:  [birth year]  DATE OF ADMISSION:  12/14/2021  PRIMARY CARE PHYSICIAN: Maryland Pink, MD   Patient is coming from: Home  REQUESTING/REFERRING PHYSICIAN: Ward, Delice Bison, DO  CHIEF COMPLAINT:   Chief Complaint  Patient presents with   Chest Pain    HISTORY OF PRESENT ILLNESS:  Linda Brady is a 81 y.o. Caucasian female with medical history significant for COPD, hypertension and hypothyroidism, presented to the ER with acute onset of worsening dyspnea with associated dry cough and wheezing for the last 3 to 4 days.  She admitted to chest pain that is midsternal and feeling like pressure with radiation to her left arm.  She graded at 5/10 in severity.  She admitted to nausea and occasional diarrhea with no vomiting.  No fever or chills.  No bleeding diathesis.  No dysuria, oliguria or hematuria or flank pain.  She admitted to occasional bilateral leg discomfort.  ED Course: When the patient came to the ER BP was 167/75 with otherwise normal vital signs.  Labs revealed mild hyponatremia of 130 and hypokalemia of 3.1 with poor chloremia of 91.  I sensitive troponin I was 5 and later 9.  CBC showed mild thrombocytosis of 424.  Influenza antigens and COVID-19 PCR came back negative.  UA was unremarkable. EKG as reviewed by me : EKG showed normal sinus rhythm with a rate of 85 with left axis deviation and RSR-with right ventricular conduction delay and Q waves anteroseptally. Imaging: 2 view chest x-ray showed no acute cardiopulmonary disease.  The patient was given a couple duo nebs by EMS and 1 here as well as IV Solu-Medrol.  She will be admitted to a cardiac telemetry bed for further evaluation and management.  PAST MEDICAL HISTORY:   Past Medical History:  Diagnosis Date   COPD (chronic obstructive pulmonary disease) ([place])    Hypertension    Thyroid disease     PAST SURGICAL HISTORY:    Past Surgical History:  Procedure Laterality Date   BREAST CYST ASPIRATION Left 10 plus yrs   benign-twice   LEFT HEART CATH AND CORONARY ANGIOGRAPHY N/A 09/11/2020   Procedure: LEFT HEART CATH AND CORONARY ANGIOGRAPHY;  Surgeon: Isaias Cowman, MD;  Location: [facility];  Service: Cardiovascular;  Laterality: N/A;    SOCIAL HISTORY:   Social History   Tobacco Use   Smoking status: Former    Packs/day: 0.50    Years: 12.00    Pack years: 6.00    Types: Cigarettes   Smokeless tobacco: Never   Tobacco comments:    7-8 cigarettes a day  Substance Use Topics   Alcohol use: Not Currently    FAMILY HISTORY:   Family History  Problem Relation Age of Onset   Breast cancer Neg Hx     DRUG ALLERGIES:   Allergies  Allergen Reactions   Naproxen Sodium Anaphylaxis    REVIEW OF SYSTEMS:   ROS As per history of present illness. All pertinent systems were reviewed above. Constitutional, HEENT, cardiovascular, respiratory, GI, GU, musculoskeletal, neuro, psychiatric, endocrine, integumentary and hematologic systems were reviewed and are otherwise negative/unremarkable except for positive findings mentioned above in the HPI.   MEDICATIONS AT HOME:   Prior to Admission medications   Medication Sig Start Date End Date Taking? Authorizing Provider  aspirin 81 MG EC tablet Take 81 mg by mouth daily.   Yes  [provider]  cholecalciferol (VITAMIN D3) 25 MCG (1000 UNIT) tablet Take 1,000 Units by mouth daily.   Yes [provider]  enalapril (VASOTEC) 20 MG tablet Take 20 mg by mouth 2 (two) times daily. 04/13/20  Yes [provider]  fluticasone-salmeterol (ADVAIR) 250-50 MCG/ACT AEPB Inhale 1 puff into the lungs in the morning and at bedtime. 06/20/21  Yes [provider]  hydrOXYzine (ATARAX/VISTARIL) 25 MG tablet Take 25 mg by mouth 3 (three) times daily as needed for anxiety. 12/13/19  Yes [provider]  levothyroxine  (SYNTHROID) 75 MCG tablet Take 75 mcg by mouth daily. 05/02/20  Yes [provider]  simvastatin (ZOCOR) 20 MG tablet Take 10 mg by mouth daily. 04/02/21  Yes [provider]  SPIRIVA HANDIHALER 18 MCG inhalation capsule Place 1 capsule into inhaler and inhale daily. 08/22/20  Yes [provider]  vitamin B-12 (CYANOCOBALAMIN) 1000 MCG tablet Take 1,000 mcg by mouth daily.   Yes [provider]  atorvastatin (LIPITOR) 40 MG tablet Take 1 tablet (40 mg total) by mouth daily. Patient not taking: Reported on 12/14/2021 09/11/20   Lorella Nimrod, MD  dicyclomine (BENTYL) 10 MG capsule Take 10 mg by mouth 4 (four) times daily as needed. Patient not taking: Reported on 07/09/2021 05/18/20   [provider]  FLUoxetine (PROZAC) 20 MG capsule Take 20 mg by mouth daily. 03/14/20 07/09/21  [provider]  nitroGLYCERIN (NITROSTAT) 0.4 MG SL tablet Place 1 tablet (0.4 mg total) under the tongue every 5 (five) minutes as needed for chest pain. For a maximum of 3 doses. 07/09/21 10/07/21  Kate Sable, MD  RABEprazole (ACIPHEX) 20 MG tablet Take 20 mg by mouth daily. Patient not taking: Reported on 12/14/2021 04/13/20   [provider]      VITAL SIGNS:  Blood pressure (!) 157/75, pulse 86, temperature 97.6 F (36.4 C), temperature source Oral, resp. rate 18, height 5\' 3"  (1.6 m), weight 81.6 kg, SpO2 95 %.  PHYSICAL EXAMINATION:  Physical Exam  GENERAL:  81 y.o.-year-old Caucasian female patient lying in the bed with no acute distress.  EYES: Pupils equal, round, reactive to light and accommodation. No scleral icterus. Extraocular muscles intact.  HEENT: Head atraumatic, normocephalic. Oropharynx and nasopharynx clear.  NECK:  Supple, no jugular venous distention. No thyroid enlargement, no tenderness.  LUNGS: Diminished expiratory airflow with diffuse expiratory wheezes and harsh physical breathing.  No use of accessory muscles of respiration.   CARDIOVASCULAR: Regular rate and rhythm, S1, S2 normal. No murmurs, rubs, or gallops.  ABDOMEN: Soft, nondistended, nontender. Bowel sounds present. No organomegaly or mass.  EXTREMITIES: No pedal edema, cyanosis, or clubbing.  NEUROLOGIC: Cranial nerves II through XII are intact. Muscle strength 5/5 in all extremities. Sensation intact. Gait not checked.  PSYCHIATRIC: The patient is alert and oriented x 3.  Normal affect and good eye contact. SKIN: No obvious rash, lesion, or ulcer.   LABORATORY PANEL:   CBC Recent Labs  Lab 12/14/21 0243  WBC 6.8  HGB 13.9  HCT 41.4  PLT 424*   ------------------------------------------------------------------------------------------------------------------  Chemistries  Recent Labs  Lab 12/14/21 0243 12/14/21 0453  NA 130*  --   K 3.1*  --   CL 91*  --   CO2 28  --   GLUCOSE 130*  --   BUN 11  --   CREATININE 0.87  --   CALCIUM 9.7  --   AST  --  22  ALT  --  13  ALKPHOS  --  81  BILITOT  --  0.4   ------------------------------------------------------------------------------------------------------------------  Cardiac Enzymes No results for input(s): TROPONINI in the last 168 hours. ------------------------------------------------------------------------------------------------------------------  RADIOLOGY:  DG Chest 2 View  Result Date: 12/14/2021 CLINICAL DATA:  Chest pain. EXAM: CHEST - 2 VIEW COMPARISON:  Chest radiograph dated 09/10/2020. FINDINGS: Background of emphysema and chronic interstitial coarsening. Bibasilar linear atelectasis/scarring. No focal consolidation, pleural effusion or pneumothorax. The cardiac silhouette is within normal limits. Atherosclerotic calcification of the aorta. No acute osseous pathology. IMPRESSION: No active cardiopulmonary disease. Electronically Signed   By: Elgie CollardArash  Radparvar M.D.   On: 12/14/2021 02:53      IMPRESSION AND PLAN:  Principal Problem:   COPD exacerbation (HCC)  1.   COPD acute exacerbation possibly secondary to acute bronchitis. - The patient will be admitted to a cardiac telemetry bed. - We will continue steroid therapy with IV Solu-Medrol. - We will continue mucolytic therapy with Mucinex. - Bronchodilator therapy will be provided with DuoNebs 4 times daily and every 4 hours as needed. - We will continue Spiriva. - Given severity of her COPD exacerbation, will add antibiotic therapy with IV Rocephin.  2.  Chest pain, rule out acute coronary syndrome. - We will follow serial troponin I's. - She will be placed on as needed IV morphine sulfate and sublingual nitroglycerin. - We will defer cardiology consultation to the morning hospitalist discretion.  3.  Hypokalemia. - Potassium will be replaced and magnesium level will be checked.  4.  Hyponatremia and hypochloremia.  This is likely hypovolemic. - The patient will be hydrated with IV normal saline I will follow her BMP.  5.  Essential hypertension. - We will continue her antihypertensives.  6.  Dyslipidemia. - We will continue statin therapy.  7.  Hypothyroidism. - We will continue her Synthroid.  8.  GERD. - We will continue her PPI therapy, especially given IV steroid therapy.  9.  Vitamin B12 deficiency. - We will resume her vitamin B12  DVT prophylaxis: Lovenox. Code Status: full code. Family Communication:  The plan of care was discussed in details with the patient (and family). I answered all questions. The patient agreed to proceed with the above mentioned plan. Further management will depend upon hospital course. Disposition Plan: Back to previous home environment Consults called: none. All the records are reviewed and case discussed with ED provider.  Status is: Inpatient   At the time of the admission, it appears that the appropriate admission status for this patient is inpatient.  This is judged to be reasonable and necessary in order to provide the required intensity of  service to ensure the patient's safety given the presenting symptoms, physical exam findings and initial radiographic and laboratory data in the context of comorbid conditions.  The patient requires inpatient status due to high intensity of service, high risk of further deterioration and high frequency of surveillance required.  I certify that at the time of admission, it is my clinical judgment that the patient will require inpatient hospital care extending more than 2 midnights.                            Dispo: The patient is from: Home              Anticipated d/c is to: Home              Patient currently is not medically stable to d/c.  Difficult to place patient: No    Christel Mormon M.D on 12/14/2021 at 9:59 AM  [facility]   From 7 PM-7 AM, contact night-coverage [email or web address]  CC: Primary care physician; [place] Pink, MD

## 2021-12-14 NOTE — ED Triage Notes (Signed)
Pt to ED from home via EMS c/o chest pain starting around 1600 yesterday and getting worse.  Hx anxiety with similar episodes in the past.  12 lead sinus unremarkable, took 4 baby ASA at home PTA of EMS.  Wheezing in lower lobes hx of COPD, 1 duoneb and 1 albuterol given en route.  Pain worse with deep breaths.  170/82 BP, 82 HR, 94% RA with EMS.  Pt A&Ox4, chest rise even and unlabored, skin WNL, and in NAD at this time.

## 2021-12-14 NOTE — Assessment & Plan Note (Signed)
Continue simvastatin. 

## 2021-12-15 ENCOUNTER — Inpatient Hospital Stay (HOSPITAL_COMMUNITY)
Admit: 2021-12-15 | Discharge: 2021-12-15 | Disposition: A | Discharge status: Home / Self Care | Payer: Medicare Other | Attending: Internal Medicine | Admitting: Internal Medicine

## 2021-12-15 DIAGNOSIS — F419 Anxiety disorder, unspecified: Secondary | ICD-10-CM | POA: Diagnosis present

## 2021-12-15 DIAGNOSIS — R778 Other specified abnormalities of plasma proteins: Secondary | ICD-10-CM | POA: Diagnosis not present

## 2021-12-15 DIAGNOSIS — I2510 Atherosclerotic heart disease of native coronary artery without angina pectoris: Secondary | ICD-10-CM | POA: Insufficient documentation

## 2021-12-15 DIAGNOSIS — J441 Chronic obstructive pulmonary disease with (acute) exacerbation: Secondary | ICD-10-CM | POA: Diagnosis not present

## 2021-12-15 DIAGNOSIS — R7989 Other specified abnormal findings of blood chemistry: Secondary | ICD-10-CM | POA: Diagnosis not present

## 2021-12-15 DIAGNOSIS — I350 Nonrheumatic aortic (valve) stenosis: Secondary | ICD-10-CM | POA: Insufficient documentation

## 2021-12-15 DIAGNOSIS — R072 Precordial pain: Secondary | ICD-10-CM

## 2021-12-15 DIAGNOSIS — R0609 Other forms of dyspnea: Secondary | ICD-10-CM | POA: Diagnosis not present

## 2021-12-15 LAB — CBG MONITORING, ED: Glucose-Capillary: 108 mg/dL — ABNORMAL HIGH (ref 70–99)

## 2021-12-15 LAB — BASIC METABOLIC PANEL
Anion gap: 5 (ref 5–15)
BUN: 11 mg/dL (ref 8–23)
CO2: 28 mmol/L (ref 22–32)
Calcium: 9.6 mg/dL (ref 8.9–10.3)
Chloride: 97 mmol/L — ABNORMAL LOW (ref 98–111)
Creatinine, Ser: 0.75 mg/dL (ref 0.44–1.00)
GFR, Estimated: >60 mL/min (ref >60)
Glucose, Bld: 121 mg/dL — ABNORMAL HIGH (ref 70–99)
Potassium: 4.9 mmol/L (ref 3.5–5.1)
Sodium: 130 mmol/L — ABNORMAL LOW (ref 135–145)

## 2021-12-15 LAB — ECHOCARDIOGRAM COMPLETE
AR max vel: 1.24 cm2
AV Area VTI: 1.3 cm2
AV Area mean vel: 1.24 cm2
AV Mean grad: 28 mmHg
AV Peak grad: 47.2 mmHg
Ao pk vel: 3.44 m/s
Area-P 1/2: 3.97 cm2
Height: 63 in
P 1/2 time: 431 msec
S' Lateral: 2.7 cm
Weight: 2880 oz

## 2021-12-15 LAB — CBC
HCT: 37.9 % (ref 36.0–46.0)
Hemoglobin: 12.5 g/dL (ref 12.0–15.0)
MCH: 30.7 pg (ref 26.0–34.0)
MCHC: 33 g/dL (ref 30.0–36.0)
MCV: 93.1 fL (ref 80.0–100.0)
Platelets: 427 10*3/uL — ABNORMAL HIGH (ref 150–400)
RBC: 4.07 MIL/uL (ref 3.87–5.11)
RDW: 13.2 % (ref 11.5–15.5)
WBC: 13.3 10*3/uL — ABNORMAL HIGH (ref 4.0–10.5)
nRBC: 0 % (ref 0.0–0.2)

## 2021-12-15 LAB — MAGNESIUM: Magnesium: 2 mg/dL (ref 1.7–2.4)

## 2021-12-15 LAB — OSMOLALITY: Osmolality: 275 mOsm/kg (ref 275–295)

## 2021-12-15 LAB — TROPONIN I (HIGH SENSITIVITY): Troponin I (High Sensitivity): 84 ng/L — ABNORMAL HIGH (ref <18)

## 2021-12-15 MED ORDER — PREDNISONE 20 MG PO TABS: 40.0000 mg | ORAL_TABLET | Freq: Every day | ORAL | Status: DC

## 2021-12-15 MED ORDER — METHYLPREDNISOLONE SODIUM SUCC 40 MG IJ SOLR
40.0000 mg | Freq: Two times a day (BID) | INTRAMUSCULAR | Status: DC
  Administered 2021-12-15 (×2): 40 mg via INTRAVENOUS
  Filled 2021-12-15 (×2): qty 1

## 2021-12-15 MED ORDER — ALPRAZOLAM 0.25 MG PO TABS: 0.2500 mg | ORAL_TABLET | Freq: Three times a day (TID) | ORAL | Status: DC | PRN

## 2021-12-15 MED ORDER — PERFLUTREN LIPID MICROSPHERE
1.0000 mL | INTRAVENOUS | Status: AC | PRN
  Administered 2021-12-15: 3 mL via INTRAVENOUS
  Filled 2021-12-15: qty 10

## 2021-12-15 MED ORDER — ISOSORBIDE MONONITRATE ER 30 MG PO TB24
15.0000 mg | ORAL_TABLET | Freq: Every day | ORAL | Status: DC
  Administered 2021-12-15 – 2021-12-16 (×2): 15 mg via ORAL
  Filled 2021-12-15 (×2): qty 1

## 2021-12-15 NOTE — ED Notes (Signed)
Waiting on synthroid from pharmacy.  

## 2021-12-15 NOTE — Assessment & Plan Note (Signed)
Chronic. Exacerbated in hospital. Vistaril pt reports is helpful. --Continue PRN Vistaril

## 2021-12-15 NOTE — Progress Notes (Signed)
*  PRELIMINARY RESULTS* Echocardiogram 2D Echocardiogram has been performed. Definity IV Contrast used on this study.  Lenor Coffin 12/15/2021, 12:35 PM

## 2021-12-15 NOTE — Progress Notes (Addendum)
Progress Note   Patient: Linda Brady [id number] DOB: Mar 22, 1941 DOA: 12/14/2021     1 DOS: the patient was seen and examined on 12/15/2021   Brief hospital course: No notes on file  Assessment and Plan * COPD with acute exacerbation ([place])- (present on admission) Present on admission with worsening dyspnea, dry cough, wheezing in addition to chest discomfort.  Very poor aeration and diffuse expiratory wheezing with conversational dyspnea was noted on admission exam. -- Continue IV steroids -- Oral Zithromax for anti-inflammatory property and possible bronchitis  -- Scheduled DuoNebs 4 times daily --As needed albuterol nebs --Hold Spiriva while on scheduled DuoNebs --Pulmicort nebs (sub for home Advair) -- Scheduled Mucinex --Monitor oxygen sats and supplement if less than 88% on room air 1/14: now with oxygen 2 L/min, initially on room air  COPD (chronic obstructive pulmonary disease) ([place])- (present on admission) Currently with acute exacerbation.  Management as outlined above. Not on home oxygen.  Chest pain- (present on admission) Reported on admission.  Patient follows with [facility].  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 Troponins initially negative, now rising and mildly elevated. EKG changes appear chronic. Chest pain improving with treatment of COPD / nebs. --Repeat troponins and stat EKG if recurrent chest pain -- Continue aspirin and statin --Cardiology consulted --Imdur was added -- As needed morphine and sublingual nitroglycerin --Follow up repeat Echo --Outpatient cardiology follow up  Hyponatremia- (present on admission) Present on admission with sodium level 130. -- Continue NS -- Monitor BMP  Hypokalemia- (present on admission) Resolved. Present on admission K 3.1.  Potassium was replaced with IV fluid additive. --Change fluids to NS without K --Monitor and replace  as needed  Essential hypertension- (present on admission) BP elevated on admission and [id number] systolic. -- Continued on home enalapril -- HCTZ was started on admission -- Titrate antihypertensives  Dyslipidemia- (present on admission) Continue simvastatin  Vitamin B12 deficiency- (present on admission) Continue B12 supplement  GERD (gastroesophageal reflux disease)- (present on admission) Continue PPI  Hypothyroidism- (present on admission) Continue levothyroxine  Anxiety- (present on admission) Chronic. Exacerbated in hospital. Vistaril pt reports is helpful. --Continue PRN Vistaril  Elevated troponin Seen by cardiology, attributed to demand ischemia, not ACS.  See chest pain A/P.  Trend until declining.     Subjective: Pt seen in ED holding for a bed. Now on oxygen.  Denies chest pain.  Says she feels better after neb treatments.  Appetite has improved.  No other acute complaints.  Says her right leg is always more swollen than left.  Objective BP controlled, soft intermittently 107/59, 100/50.    General exam: awake, alert, no acute distress HEENT: atraumatic, clear conjunctiva, anicteric sclera, moist mucus membranes, hearing grossly normal  Respiratory system: poor air movement with tight expiratory wheezes, no rhonchi, normal respiratory effort at rest on 2 L/min [place] O2. Cardiovascular system: normal S1/S2, RRR, stable L>R LE edema.   Gastrointestinal system: soft, NT, ND, no HSM felt, +bowel sounds. Central nervous system: A&O x3. no gross focal neurologic deficits, normal speech Extremities: moves all, no cyanosis, normal tone Skin: dry, intact, normal temperature Psychiatry: normal mood, congruent affect, judgement and insight appear normal   Data Reviewed:  Labs notable for Na 130 (no change), K 4.9, Cl 97, Glucose 121, Troponin trens 7>>47>>56>>84, serum osm 275, wbc 13.3k plt 427k  Family Communication: none at bedside, will attempt to call  daughter  Disposition: Status is: Inpatient  Remains inpatient appropriate because: on oxygen and IV steroids for COPD exacerbation         Time spent: 35 minutes  Author: Ezekiel Slocumb, DO 12/15/2021 5:10 PM  For on call review [email or web address].

## 2021-12-15 NOTE — Assessment & Plan Note (Signed)
Seen by cardiology, attributed to demand ischemia, not ACS.  See chest pain A/P.  Trend until declining.

## 2021-12-15 NOTE — Plan of Care (Signed)
Dr. Informed of rapidly increasing [place].  New labs and orders placed.

## 2021-12-15 NOTE — Consult Note (Signed)
Cardiology Consultation:   Patient ID: Linda Brady MRN: [id number]; DOB: 30-Jun-1941  Admit date: 12/14/2021 Date of Consult: 12/15/2021  PCP:  Maryland Pink, MD   [facility] Providers Cardiologist:  Kate Sable, MD        Patient Profile:   Linda Brady is a 81 y.o. female with a hx of CAD, hypertension, COPD who is being seen 12/15/2021 for the evaluation of chest pain at the request of Dr. Arbutus Ped.  History of Present Illness:   Linda Brady is a 81 y.o. female with a hx of CAD  (50% prox RCA, 30% LAD), hypertension, former smoker, COPD who presents due to worsening shortness of breath and wheezing over the past 4 to 5 days.  Patient also states feeling very anxious, with associated chest pressure prompting her to come to the emergency room.  Endorsed feeling nauseous, denies palpitations.  She tried taking sublingual nitro but states it burned her tongue and she took it out.  Since admission, diagnosed with COPD exacerbation, given breathing treatments and steroids with improvement in symptoms.  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  Feels much better, states being claustrophobic and anxious.  Would like her room door to stay open.   Past Medical History:  Diagnosis Date   COPD (chronic obstructive pulmonary disease) ([place])    Hypertension    Thyroid disease     Past Surgical History:  Procedure Laterality Date   BREAST CYST ASPIRATION Left 10 plus yrs   benign-twice   LEFT HEART CATH AND CORONARY ANGIOGRAPHY N/A 09/11/2020   Procedure: LEFT HEART CATH AND CORONARY ANGIOGRAPHY;  Surgeon: Isaias Cowman, MD;  Location: [place] [facility];  Service: Cardiovascular;  Laterality: N/A;     Home Medications:  Prior to Admission medications   Medication Sig Start Date End Date Taking? Authorizing Provider  aspirin 81 MG EC tablet Take 81 mg by mouth daily.   Yes [provider]   cholecalciferol (VITAMIN D3) 25 MCG (1000 UNIT) tablet Take 1,000 Units by mouth daily.   Yes [provider]  enalapril (VASOTEC) 20 MG tablet Take 20 mg by mouth 2 (two) times daily. 04/13/20  Yes [provider]  fluticasone-salmeterol (ADVAIR) 250-50 MCG/ACT AEPB Inhale 1 puff into the lungs in the morning and at bedtime. 06/20/21  Yes [provider]  hydrOXYzine (ATARAX/VISTARIL) 25 MG tablet Take 25 mg by mouth 3 (three) times daily as needed for anxiety. 12/13/19  Yes [provider]  levothyroxine (SYNTHROID) 75 MCG tablet Take 75 mcg by mouth daily. 05/02/20  Yes [provider]  simvastatin (ZOCOR) 20 MG tablet Take 10 mg by mouth daily. 04/02/21  Yes [provider]  SPIRIVA HANDIHALER 18 MCG inhalation capsule Place 1 capsule into inhaler and inhale daily. 08/22/20  Yes [provider]  vitamin B-12 (CYANOCOBALAMIN) 1000 MCG tablet Take 1,000 mcg by mouth daily.   Yes [provider]  atorvastatin (LIPITOR) 40 MG tablet Take 1 tablet (40 mg total) by mouth daily. Patient not taking: Reported on 12/14/2021 09/11/20   Lorella Nimrod, MD  dicyclomine (BENTYL) 10 MG capsule Take 10 mg by mouth 4 (four) times daily as needed. Patient not taking: Reported on 07/09/2021 05/18/20   [provider]  FLUoxetine (PROZAC) 20 MG capsule Take 20 mg by mouth daily. 03/14/20 07/09/21  [provider]  nitroGLYCERIN (NITROSTAT) 0.4 MG SL tablet Place 1 tablet (0.4 mg total) under the tongue  every 5 (five) minutes as needed for chest pain. For a maximum of 3 doses. 07/09/21 10/07/21  Kate Sable, MD  RABEprazole (ACIPHEX) 20 MG tablet Take 20 mg by mouth daily. Patient not taking: Reported on 12/14/2021 04/13/20   [provider]    Inpatient Medications: Scheduled Meds:  aspirin EC  81 mg Oral Daily   azithromycin  250 mg Oral Daily   budesonide (PULMICORT) nebulizer solution  0.25 mg Nebulization BID    cholecalciferol  1,000 Units Oral Daily   enalapril  20 mg Oral BID   enoxaparin (LOVENOX) injection  40 mg Subcutaneous Q24H   FLUoxetine  20 mg Oral Daily   guaiFENesin  600 mg Oral BID   hydrochlorothiazide  12.5 mg Oral Daily   ipratropium-albuterol  3 mL Nebulization QID   levothyroxine  75 mcg Oral Daily   methylPREDNISolone (SOLU-MEDROL) injection  40 mg Intravenous Q12H   pantoprazole  40 mg Oral Daily   simvastatin  10 mg Oral q1800   vitamin B-12  1,000 mcg Oral Daily   Continuous Infusions:  PRN Meds: acetaminophen **OR** acetaminophen, albuterol, hydrOXYzine, magnesium hydroxide, morphine injection, nitroGLYCERIN, ondansetron **OR** ondansetron (ZOFRAN) IV, traZODone  Allergies:    Allergies  Allergen Reactions   Naproxen Sodium Anaphylaxis    Social History:   Social History   Socioeconomic History   Marital status: Widowed    Spouse name: Not on file   Number of children: Not on file   Years of education: Not on file   Highest education level: Not on file  Occupational History   Not on file  Tobacco Use   Smoking status: Former    Packs/day: 0.50    Years: 12.00    Pack years: 6.00    Types: Cigarettes   Smokeless tobacco: Never   Tobacco comments:    7-8 cigarettes a day  Vaping Use   Vaping Use: Never used  Substance and Sexual Activity   Alcohol use: Not Currently   Drug use: Not Currently   Sexual activity: Not Currently  Other Topics Concern   Not on file  Social History Narrative   Not on file   Social Determinants of Health   Financial Resource Strain: Not on file  Food Insecurity: Not on file  Transportation Needs: Not on file  Physical Activity: Not on file  Stress: Not on file  Social Connections: Not on file  Intimate Partner Violence: Not on file    Family History:    Family History  Problem Relation Age of Onset   Breast cancer Neg Hx      ROS:  Please see the history of present illness.   All other ROS reviewed and  negative.     Physical Exam/Data:   Vitals:   12/15/21 0530 12/15/21 0628 12/15/21 0800 12/15/21 1100  BP: (!) 125/55  138/65 133/70  Pulse: 83  81 97  Resp: (!) 23  20 20   Temp:  98.7 F (37.1 C) 98.8 F (37.1 C)   TempSrc:  Oral Oral   SpO2: 94%  93% 95%  Weight:      Height:        Intake/Output Summary (Last 24 hours) at 12/15/2021 1433 Last data filed at 12/15/2021 1200 Gross per 24 hour  Intake --  Output 1750 ml  Net -1750 ml   Last 3 Weights 12/14/2021 07/09/2021 09/11/2020  Weight (lbs) 180 lb 182 lb 170 lb  Weight (kg) 81.647 kg 82.555 kg 77.111 kg  Body mass index is 31.89 kg/m.  General:  Well nourished, well developed, in no acute distress HEENT: normal Neck: no JVD Vascular: No carotid bruits; Distal pulses 2+ bilaterally Cardiac:  normal S1, S2; RRR; 2/6 systolic murmur  Lungs: Bilateral wheezing Abd: soft, nontender, no hepatomegaly  Ext: no edema Musculoskeletal:  No deformities, BUE and BLE strength normal and equal Skin: warm and dry  Neuro:  CNs 2-12 intact, no focal abnormalities noted Psych:  Normal affect   EKG:  The EKG was personally reviewed and demonstrates: Sinus rhythm, old septal infarct Telemetry:  Telemetry was personally reviewed and demonstrates: Sinus rhythm  Relevant CV Studies: TTE 12/15/2021 1. Left ventricular ejection fraction, by estimation, is 65 to 70%. The  left ventricle has normal function. The left ventricle has no regional  wall motion abnormalities. Left ventricular diastolic parameters are  consistent with Grade II diastolic  dysfunction (pseudonormalization).   2. Right ventricular systolic function is normal. The right ventricular  size is normal.   3. The mitral valve is degenerative. No evidence of mitral valve  regurgitation.   4. Aortic valve DVI=0.38, Vmax 59m/s, mean gradient 25mmHg, PG 54mmHg AVA  1.2cm2.. The aortic valve is calcified. Aortic valve regurgitation is  mild. Moderate aortic valve  stenosis.   5. The inferior vena cava is normal in size with greater than 50%  respiratory variability, suggesting right atrial pressure of 3 mmHg.   Laboratory Data:  High Sensitivity Troponin:   Recent Labs  Lab 12/14/21 0453 12/14/21 0656 12/14/21 1340 12/14/21 1952 12/15/21 1218  TROPONINIHS 9 7 47* 56* 84*     Chemistry Recent Labs  Lab 12/14/21 0243 12/14/21 1405 12/15/21 1218  NA 130*  --  130*  K 3.1*  --  4.9  CL 91*  --  97*  CO2 28  --  28  GLUCOSE 130*  --  121*  BUN 11  --  11  CREATININE 0.87  --  0.75  CALCIUM 9.7  --  9.6  MG  --  1.9 2.0  GFRNONAA >60  --  >60  ANIONGAP 11  --  5    Recent Labs  Lab 12/14/21 0453  PROT 7.4  ALBUMIN 3.7  AST 22  ALT 13  ALKPHOS 81  BILITOT 0.4   Lipids No results for input(s): CHOL, TRIG, HDL, LABVLDL, LDLCALC, CHOLHDL in the last 168 hours.  Hematology Recent Labs  Lab 12/14/21 0243 12/15/21 0544  WBC 6.8 13.3*  RBC 4.45 4.07  HGB 13.9 12.5  HCT 41.4 37.9  MCV 93.0 93.1  MCH 31.2 30.7  MCHC 33.6 33.0  RDW 12.8 13.2  PLT 424* 427*   Thyroid No results for input(s): TSH, FREET4 in the last 168 hours.  BNPNo results for input(s): BNP, PROBNP in the last 168 hours.  DDimer No results for input(s): DDIMER in the last 168 hours.   Radiology/Studies:  DG Chest 2 View  Result Date: 12/14/2021 CLINICAL DATA:  Chest pain. EXAM: CHEST - 2 VIEW COMPARISON:  Chest radiograph dated 09/10/2020. FINDINGS: Background of emphysema and chronic interstitial coarsening. Bibasilar linear atelectasis/scarring. No focal consolidation, pleural effusion or pneumothorax. The cardiac silhouette is within normal limits. Atherosclerotic calcification of the aorta. No acute osseous pathology. IMPRESSION: No active cardiopulmonary disease. Electronically Signed   By: Anner Crete M.D.   On: 12/14/2021 02:53     Assessment and Plan:   Hx of CAD (50% LAD) -Minimally elevated troponins represents demand supply mismatch with  nonobstructive  underlying CAD -Symptoms of chest pain improved with inhalers. -Echo with preserved ejection fraction -Continue PTA Lipitor, aspirin 81 mg. -Start Imdur 15 mg daily -Consider outpatient stress testing/Lexiscan  2.  Grade 2 diastolic dysfunction -Patient is euvolemic -HCTZ Vasotec for hypertension  3.  Moderate aortic stenosis -Keep follow-up as outpatient for monitoring with serial echoes  4.  COPD, exacerbation -Inhalers, steroids as per primary team  No additional cardiac testing or intervention planned.  Start Imdur as above.  Close follow-up with cardiology as outpatient advised.  Please let us know if additional cardiac input is needed.  Total encounter time more than 110 minutes  Greater than 50% was spent in counseling and coordination of care with the patient   Signed, Kate Sable, MD  12/15/2021 2:33 PM

## 2021-12-16 DIAGNOSIS — I5032 Chronic diastolic (congestive) heart failure: Secondary | ICD-10-CM | POA: Diagnosis present

## 2021-12-16 LAB — CORTISOL: Cortisol, Plasma: 1.8 ug/dL

## 2021-12-16 LAB — BASIC METABOLIC PANEL
Anion gap: 9 (ref 5–15)
BUN: 12 mg/dL (ref 8–23)
CO2: 25 mmol/L (ref 22–32)
Calcium: 9.5 mg/dL (ref 8.9–10.3)
Chloride: 99 mmol/L (ref 98–111)
Creatinine, Ser: 0.65 mg/dL (ref 0.44–1.00)
GFR, Estimated: >60 mL/min (ref >60)
Glucose, Bld: 141 mg/dL — ABNORMAL HIGH (ref 70–99)
Potassium: 4.5 mmol/L (ref 3.5–5.1)
Sodium: 133 mmol/L — ABNORMAL LOW (ref 135–145)

## 2021-12-16 LAB — TROPONIN I (HIGH SENSITIVITY): Troponin I (High Sensitivity): 50 ng/L — ABNORMAL HIGH (ref <18)

## 2021-12-16 LAB — CBC
HCT: 36.7 % (ref 36.0–46.0)
Hemoglobin: 12.4 g/dL (ref 12.0–15.0)
MCH: 30.9 pg (ref 26.0–34.0)
MCHC: 33.8 g/dL (ref 30.0–36.0)
MCV: 91.5 fL (ref 80.0–100.0)
Platelets: 390 10*3/uL (ref 150–400)
RBC: 4.01 MIL/uL (ref 3.87–5.11)
RDW: 13.1 % (ref 11.5–15.5)
WBC: 11.6 10*3/uL — ABNORMAL HIGH (ref 4.0–10.5)
nRBC: 0 % (ref 0.0–0.2)

## 2021-12-16 LAB — TSH: TSH: 1.547 u[IU]/mL (ref 0.350–4.500)

## 2021-12-16 LAB — MAGNESIUM: Magnesium: 2 mg/dL (ref 1.7–2.4)

## 2021-12-16 MED ORDER — IPRATROPIUM-ALBUTEROL 0.5-2.5 (3) MG/3ML IN SOLN: 3.0000 mL | Freq: Two times a day (BID) | RESPIRATORY_TRACT | Status: DC

## 2021-12-16 MED ORDER — AZITHROMYCIN 250 MG PO TABS: ORAL_TABLET | ORAL | 0 refills | Status: DC

## 2021-12-16 MED ORDER — PREDNISONE 10 MG PO TABS: ORAL_TABLET | ORAL | 0 refills | Status: AC

## 2021-12-16 MED ORDER — PANTOPRAZOLE SODIUM 40 MG PO TBEC: 40.0000 mg | DELAYED_RELEASE_TABLET | Freq: Every day | ORAL | Status: DC

## 2021-12-16 MED ORDER — ALBUTEROL SULFATE (2.5 MG/3ML) 0.083% IN NEBU: 2.5000 mg | INHALATION_SOLUTION | RESPIRATORY_TRACT | Status: DC | PRN

## 2021-12-16 MED ORDER — HYDROCHLOROTHIAZIDE 25 MG PO TABS: 12.5000 mg | ORAL_TABLET | Freq: Every day | ORAL | 2 refills | Status: DC

## 2021-12-16 MED ORDER — PREDNISONE 20 MG PO TABS
40.0000 mg | ORAL_TABLET | Freq: Every day | ORAL | Status: DC
  Administered 2021-12-16: 40 mg via ORAL
  Filled 2021-12-16: qty 2

## 2021-12-16 MED ORDER — ALBUTEROL SULFATE HFA 108 (90 BASE) MCG/ACT IN AERS: 2.0000 | INHALATION_SPRAY | RESPIRATORY_TRACT | 2 refills | Status: DC | PRN

## 2021-12-16 MED ORDER — ISOSORBIDE MONONITRATE ER 30 MG PO TB24: 15.0000 mg | ORAL_TABLET | Freq: Every day | ORAL | 2 refills | Status: DC

## 2021-12-16 NOTE — Progress Notes (Signed)
°  Transition of Care [facility]) Screening Note   Patient Details  Name: Linda Brady Date of Birth: March 06, 1941   Transition of Care [facility]) CM/SW Contact:    Gildardo Griffes, LCSW Phone Number: 12/16/2021, 9:52 AM    Transition of Care Department [facility]) has reviewed patient and no TOC needs have been identified at this time. We will continue to monitor patient advancement through interdisciplinary progression rounds. If new patient transition needs arise, please place a TOC consult.  [place], [place] [phone]

## 2021-12-16 NOTE — Discharge Summary (Signed)
Physician Discharge Summary   Patient: Linda Brady MRN: [id number] DOB: 1941/08/07  Admit date:     12/14/2021  Discharge date: 12/16/21  Discharge Physician: Ezekiel Slocumb   PCP: [place] Pink, MD   Recommendations at discharge:    Follow up with PCP in 1-2 weeks CBC/CMP in 1-2 weeks  Discharge Diagnoses Principal Problem:   COPD with acute exacerbation ([place]) Active Problems:   COPD (chronic obstructive pulmonary disease) (HCC)   Chest pain   Elevated troponin   Hypokalemia   Hyponatremia   Aortic stenosis, moderate   Chronic heart failure with preserved ejection fraction (HFpEF) ([place])   Essential hypertension   Dyslipidemia   GERD (gastroesophageal reflux disease)   Vitamin B12 deficiency   Hypothyroidism   Anxiety    Hospital Course   Patient admitted with COPD exacerbation, treated with steroids, Zithromax, bronchodilators.  Clinically improved and stable for discharge home.   * COPD with acute exacerbation ([place])- (present on admission) Present on admission with worsening dyspnea, dry cough, wheezing in addition to chest discomfort.  Very poor aeration and diffuse expiratory wheezing with conversational dyspnea was noted on admission exam. -- Continue IV steroids -- Oral Zithromax for anti-inflammatory property and possible bronchitis  -- Scheduled DuoNebs 4 times daily --As needed albuterol nebs --Hold Spiriva while on scheduled DuoNebs --Pulmicort nebs (sub for home Advair) -- Scheduled Mucinex --Monitor oxygen sats and supplement if less than 88% on room air 1/14: now with oxygen 2 L/min, initially on room air  COPD (chronic obstructive pulmonary disease) ([place])- (present on admission) Currently with acute exacerbation.  Management as outlined above. Not on home oxygen.  Elevated troponin Seen by cardiology, attributed to demand ischemia, not ACS.  See chest pain A/P.  Trend until declining.  Chest pain- (present on admission) Reported on  admission.  Patient follows with [facility] cardiology.  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 Troponins initially negative, now rising and mildly elevated. EKG changes appear chronic. Chest pain improving with treatment of COPD / nebs. --Repeat troponins and stat EKG if recurrent chest pain -- Continue aspirin and statin --Cardiology consulted --Imdur was added -- As needed morphine and sublingual nitroglycerin --Follow up repeat Echo --Outpatient cardiology follow up  Hyponatremia- (present on admission) Present on admission with sodium level 130. -- Continue NS -- Monitor BMP  Hypokalemia- (present on admission) Resolved. Present on admission K 3.1.  Potassium was replaced with IV fluid additive. --Change fluids to NS without K --Monitor and replace as needed  Essential hypertension- (present on admission) BP elevated on admission and [id number] systolic. -- Continued on home enalapril -- HCTZ was started on admission -- Titrate antihypertensives  Dyslipidemia- (present on admission) Continue simvastatin  Vitamin B12 deficiency- (present on admission) Continue B12 supplement  GERD (gastroesophageal reflux disease)- (present on admission) Continue PPI  Hypothyroidism- (present on admission) Continue levothyroxine  Anxiety- (present on admission) Chronic. Exacerbated in hospital. Vistaril pt reports is helpful. --Continue PRN Vistaril        Consultants: none Procedures performed: none  Disposition: Home Diet recommendation: Regular diet  DISCHARGE MEDICATION: Allergies as of 12/16/2021       Reactions   Naproxen Sodium Anaphylaxis        Medication List     STOP taking these medications    atorvastatin 40 MG tablet Commonly known as: LIPITOR   dicyclomine 10 MG capsule Commonly known as: BENTYL  RABEprazole 20 MG tablet Commonly known as: ACIPHEX Replaced by: pantoprazole 40 MG  tablet       TAKE these medications    albuterol 108 (90 Base) MCG/ACT inhaler Commonly known as: VENTOLIN HFA Inhale 2 puffs into the lungs every 4 (four) hours as needed for wheezing or shortness of breath.   aspirin 81 MG EC tablet Take 81 mg by mouth daily.   azithromycin 250 MG tablet Commonly known as: Zithromax Take 1 tablet (250 mg) by mouth once daily for 2 days   cholecalciferol 25 MCG (1000 UNIT) tablet Commonly known as: VITAMIN D3 Take 1,000 Units by mouth daily.   enalapril 20 MG tablet Commonly known as: VASOTEC Take 20 mg by mouth 2 (two) times daily.   FLUoxetine 20 MG capsule Commonly known as: PROZAC Take 20 mg by mouth daily.   fluticasone-salmeterol 250-50 MCG/ACT Aepb Commonly known as: ADVAIR Inhale 1 puff into the lungs in the morning and at bedtime.   hydrochlorothiazide 25 MG tablet Commonly known as: HYDRODIURIL Take 0.5 tablets (12.5 mg total) by mouth daily.   hydrOXYzine 25 MG tablet Commonly known as: ATARAX Take 25 mg by mouth 3 (three) times daily as needed for anxiety.   isosorbide mononitrate 30 MG 24 hr tablet Commonly known as: IMDUR Take 0.5 tablets (15 mg total) by mouth daily.   levothyroxine 75 MCG tablet Commonly known as: SYNTHROID Take 75 mcg by mouth daily.   nitroGLYCERIN 0.4 MG SL tablet Commonly known as: NITROSTAT Place 1 tablet (0.4 mg total) under the tongue every 5 (five) minutes as needed for chest pain. For a maximum of 3 doses.   pantoprazole 40 MG tablet Commonly known as: PROTONIX Take 1 tablet (40 mg total) by mouth daily. Replaces: RABEprazole 20 MG tablet   predniSONE 10 MG tablet Commonly known as: DELTASONE Take 3 tablets (30 mg total) by mouth daily with breakfast for 1 day, THEN 2 tablets (20 mg total) daily with breakfast for 1 day, THEN 1 tablet (10 mg total) daily with breakfast for 1 day. Start taking on: December 16, 2021   simvastatin 20 MG tablet Commonly known as: ZOCOR Take 10 mg  by mouth daily.   Spiriva HandiHaler 18 MCG inhalation capsule Generic drug: tiotropium Place 1 capsule into inhaler and inhale daily.   vitamin B-12 1000 MCG tablet Commonly known as: CYANOCOBALAMIN Take 1,000 mcg by mouth daily.         Discharge Exam: Filed Weights   12/14/21 0218  Weight: 81.6 kg   General exam: awake, alert, no acute distress HEENT: atraumatic, clear conjunctiva, anicteric sclera, moist mucus membranes, hearing grossly normal  Respiratory system: CTAB with improved air movement, no wheezes, rales or rhonchi, normal respiratory effort. Cardiovascular system: normal S1/S2, RRR, no JVD, murmurs, rubs, gallops,  no pedal edema.   Gastrointestinal system: soft, NT, ND, no HSM felt, +bowel sounds. Central nervous system: A&O x3. no gross focal neurologic deficits, normal speech Extremities: moves all, no edema, normal tone Skin: dry, intact, normal temperature Psychiatry: normal mood, congruent affect, judgement and insight appear normal Condition at discharge: stable  The results of significant diagnostics from this hospitalization (including imaging, microbiology, ancillary and laboratory) are listed below for reference.   Imaging Studies: DG Chest 2 View  Result Date: 12/14/2021 CLINICAL DATA:  Chest pain. EXAM: CHEST - 2 VIEW COMPARISON:  Chest radiograph dated 09/10/2020. FINDINGS: Background of emphysema and chronic interstitial coarsening. Bibasilar linear atelectasis/scarring. No focal consolidation, pleural effusion or pneumothorax.  The cardiac silhouette is within normal limits. Atherosclerotic calcification of the aorta. No acute osseous pathology. IMPRESSION: No active cardiopulmonary disease. Electronically Signed   By: Anner Crete M.D.   On: 12/14/2021 02:53   ECHOCARDIOGRAM COMPLETE  Result Date: 12/15/2021    ECHOCARDIOGRAM REPORT   Patient Name:   Linda Brady Date of Exam: 12/15/2021 Medical Rec #:  [id number]         Height:        63.0 in Accession #:    [id number]        Weight:       180.0 lb Date of Birth:  04-19-1941        BSA:          1.849 m Patient Age:    81 years          BP:           125/55 mmHg Patient Gender: F                 HR:           82 bpm. Exam Location:  [facility] Procedure: 2D Echo and Intracardiac Opacification Agent Indications:     Dyspnea R06.00  History:         Patient has prior history of Echocardiogram examinations, most                  recent 09/11/2020.  Sonographer:     Kathlen Brunswick RDCS Referring Phys:  [id number] Claiborne Billings A Gearldine Looney Diagnosing Phys: Kate Sable MD  Sonographer Comments: Technically difficult study due to poor echo windows. Image acquisition challenging due to patient body habitus. IMPRESSIONS  1. Left ventricular ejection fraction, by estimation, is 65 to 70%. The left ventricle has normal function. The left ventricle has no regional wall motion abnormalities. Left ventricular diastolic parameters are consistent with Grade II diastolic dysfunction (pseudonormalization).  2. Right ventricular systolic function is normal. The right ventricular size is normal.  3. The mitral valve is degenerative. No evidence of mitral valve regurgitation.  4. Aortic valve DVI=0.38, Vmax 37m/s, mean gradient 72mmHg, PG 32mmHg AVA 1.2cm2.. The aortic valve is calcified. Aortic valve regurgitation is mild. Moderate aortic valve stenosis.  5. The inferior vena cava is normal in size with greater than 50% respiratory variability, suggesting right atrial pressure of 3 mmHg. FINDINGS  Left Ventricle: Left ventricular ejection fraction, by estimation, is 65 to 70%. The left ventricle has normal function. The left ventricle has no regional wall motion abnormalities. Definity contrast agent was given IV to delineate the left ventricular  endocardial borders. The left ventricular internal cavity size was normal in size. There is no left ventricular hypertrophy. Left ventricular diastolic parameters are consistent  with Grade II diastolic dysfunction (pseudonormalization). Right Ventricle: The right ventricular size is normal. No increase in right ventricular wall thickness. Right ventricular systolic function is normal. Left Atrium: Left atrial size was normal in size. Right Atrium: Right atrial size was normal in size. Pericardium: There is no evidence of pericardial effusion. Mitral Valve: The mitral valve is degenerative in appearance. There is mild calcification of the mitral valve leaflet(s). Mild mitral annular calcification. No evidence of mitral valve regurgitation. Tricuspid Valve: The tricuspid valve is normal in structure. Tricuspid valve regurgitation is not demonstrated. Aortic Valve: Aortic valve DVI=0.38, Vmax 48m/s, mean gradient 35mmHg, PG 39mmHg AVA 1.2cm2. The aortic valve is calcified. Aortic valve regurgitation is mild. Aortic regurgitation PHT measures 431 msec. Moderate aortic stenosis is  present. Aortic valve mean gradient measures 28.0 mmHg. Aortic valve peak gradient measures 47.2 mmHg. Aortic valve area, by VTI measures 1.30 cm. Pulmonic Valve: The pulmonic valve was not well visualized. Pulmonic valve regurgitation is not visualized. Aorta: The aortic root and ascending aorta are structurally normal, with no evidence of dilitation. Venous: The inferior vena cava is normal in size with greater than 50% respiratory variability, suggesting right atrial pressure of 3 mmHg. IAS/Shunts: No atrial level shunt detected by color flow Doppler.  LEFT VENTRICLE PLAX 2D LVIDd:         4.00 cm   Diastology LVIDs:         2.70 cm   LV e' medial:    6.20 cm/s LV PW:         0.90 cm   LV E/e' medial:  16.1 LV IVS:        0.90 cm   LV e' lateral:   5.87 cm/s LVOT diam:     2.00 cm   LV E/e' lateral: 17.0 LV SV:         103 LV SV Index:   56 LVOT Area:     3.14 cm  RIGHT VENTRICLE RV Basal diam:  3.20 cm RV S prime:     12.20 cm/s TAPSE (M-mode): 2.2 cm LEFT ATRIUM             Index        RIGHT ATRIUM            Index LA diam:        3.30 cm 1.78 cm/m   RA Area:     15.80 cm LA Vol (A2C):   22.6 ml 12.22 ml/m  RA Volume:   41.30 ml  22.34 ml/m LA Vol (A4C):   28.3 ml 15.31 ml/m LA Biplane Vol: 25.3 ml 13.68 ml/m  AORTIC VALVE AV Area (Vmax):    1.24 cm AV Area (Vmean):   1.24 cm AV Area (VTI):     1.30 cm AV Vmax:           343.60 cm/s AV Vmean:          247.000 cm/s AV VTI:            0.793 m AV Peak Grad:      47.2 mmHg AV Mean Grad:      28.0 mmHg LVOT Vmax:         136.00 cm/s LVOT Vmean:        97.600 cm/s LVOT VTI:          0.328 m LVOT/AV VTI ratio: 0.41 AI PHT:            431 msec  AORTA Ao Root diam: 2.80 cm Ao Asc diam:  2.70 cm MITRAL VALVE MV Area (PHT): 3.97 cm    SHUNTS MV Decel Time: 191 msec    Systemic VTI:  0.33 m MV E velocity: 99.80 cm/s  Systemic Diam: 2.00 cm MV A velocity: 96.80 cm/s MV E/A ratio:  1.03 Kate Sable MD Electronically signed by Kate Sable MD Signature Date/Time: 12/15/2021/2:47:14 PM    Final     Microbiology: Results for orders placed or performed during the hospital encounter of 12/14/21  Resp Panel by RT-PCR (Flu A&B, Covid) Nasopharyngeal Swab     Status: None   Collection Time: 12/14/21  2:21 AM   Specimen: Nasopharyngeal Swab; Nasopharyngeal(NP) swabs in vial transport medium  Result Value Ref Range Status   SARS Coronavirus 2  by RT PCR NEGATIVE NEGATIVE Final    Comment: (NOTE) SARS-CoV-2 target nucleic acids are NOT DETECTED.  The SARS-CoV-2 RNA is generally detectable in upper respiratory specimens during the acute phase of infection. The lowest concentration of SARS-CoV-2 viral copies this assay can detect is 138 copies/mL. A negative result does not preclude SARS-Cov-2 infection and should not be used as the sole basis for treatment or other patient management decisions. A negative result may occur with  improper specimen collection/handling, submission of specimen other than nasopharyngeal swab, presence of viral mutation(s) within  the areas targeted by this assay, and inadequate number of viral copies(<138 copies/mL). A negative result must be combined with clinical observations, patient history, and epidemiological information. The expected result is Negative.  Fact Sheet for Patients:  EntrepreneurPulse.com.au  Fact Sheet for Healthcare Providers:  IncredibleEmployment.be  This test is no t yet approved or cleared by the [place] FDA and  has been authorized for detection and/or diagnosis of SARS-CoV-2 by FDA under an Emergency Use Authorization (EUA). This EUA will remain  in effect (meaning this test can be used) for the duration of the COVID-19 declaration under Section 564(b)(1) of the Act, 21 U.S.C.section 360bbb-3(b)(1), unless the authorization is terminated  or revoked sooner.       Influenza A by PCR NEGATIVE NEGATIVE Final   Influenza B by PCR NEGATIVE NEGATIVE Final    Comment: (NOTE) The Xpert Xpress SARS-CoV-2/FLU/RSV plus assay is intended as an aid in the diagnosis of influenza from Nasopharyngeal swab specimens and should not be used as a sole basis for treatment. Nasal washings and aspirates are unacceptable for Xpert Xpress SARS-CoV-2/FLU/RSV testing.  Fact Sheet for Patients: EntrepreneurPulse.com.au  Fact Sheet for Healthcare Providers: IncredibleEmployment.be  This test is not yet approved or cleared by the [place] FDA and has been authorized for detection and/or diagnosis of SARS-CoV-2 by FDA under an Emergency Use Authorization (EUA). This EUA will remain in effect (meaning this test can be used) for the duration of the COVID-19 declaration under Section 564(b)(1) of the Act, 21 U.S.C. section 360bbb-3(b)(1), unless the authorization is terminated or revoked.  Performed at [facility], [address] [place], [place] [address]     Labs: CBC: Recent Labs  Lab 12/14/21 0243  12/15/21 0544 12/16/21 0534  WBC 6.8 13.3* 11.6*  HGB 13.9 12.5 12.4  HCT 41.4 37.9 36.7  MCV 93.0 93.1 91.5  PLT 424* 427* [id number]   Basic Metabolic Panel: Recent Labs  Lab 12/14/21 0243 12/14/21 1405 12/15/21 1218 12/16/21 0534  NA 130*  --  130* 133*  K 3.1*  --  4.9 4.5  CL 91*  --  97* 99  CO2 28  --  28 25  GLUCOSE 130*  --  121* 141*  BUN 11  --  11 12  CREATININE 0.87  --  0.75 0.65  CALCIUM 9.7  --  9.6 9.5  MG  --  1.9 2.0 2.0   Liver Function Tests: Recent Labs  Lab 12/14/21 0453  AST 22  ALT 13  ALKPHOS 81  BILITOT 0.4  PROT 7.4  ALBUMIN 3.7   CBG: Recent Labs  Lab 12/15/21 1208  GLUCAP 108*    Discharge time spent: less than 30 minutes.  Signed: Ezekiel Slocumb, DO [facility] 12/16/2021

## 2021-12-24 ENCOUNTER — Telehealth: Payer: Self-pay | Admitting: Cardiology

## 2021-12-24 ENCOUNTER — Other Ambulatory Visit: Payer: Self-pay

## 2021-12-24 MED ORDER — PANTOPRAZOLE SODIUM 40 MG PO TBEC: 40.0000 mg | DELAYED_RELEASE_TABLET | Freq: Every day | ORAL | 0 refills | Status: DC

## 2021-12-24 MED ORDER — NITROGLYCERIN 0.4 MG SL SUBL: 0.4000 mg | SUBLINGUAL_TABLET | SUBLINGUAL | 3 refills | Status: DC | PRN

## 2021-12-24 NOTE — Telephone Encounter (Signed)
Requested Prescriptions   Signed Prescriptions Disp Refills   nitroGLYCERIN (NITROSTAT) 0.4 MG SL tablet 25 tablet 3    Sig: Place 1 tablet (0.4 mg total) under the tongue every 5 (five) minutes as needed for chest pain. For a maximum of 3 doses.    Authorizing Provider: Debbe Odea    Ordering User: Margrett Rud   pantoprazole (PROTONIX) 40 MG tablet 90 tablet 0    Sig: Take 1 tablet (40 mg total) by mouth daily.    Authorizing Provider: Debbe Odea    Ordering User: Margrett Rud

## 2021-12-24 NOTE — Telephone Encounter (Signed)
°*  STAT* If patient is at the pharmacy, call can be transferred to refill team.   1. Which medications need to be refilled? (please list name of each medication and dose if known) Protonix 40 mg 1 tablet daily, NitroGlycerin, liquid form  2. Which pharmacy/location (including street and city if local pharmacy) is medication to be sent to? [facility]  3. Do they need a 30 day or 90 day supply? 90 day

## 2022-01-11 ENCOUNTER — Ambulatory Visit: Admitting: Cardiology

## 2022-02-28 ENCOUNTER — Emergency Department: Payer: Medicare Other

## 2022-02-28 ENCOUNTER — Other Ambulatory Visit: Payer: Self-pay

## 2022-02-28 ENCOUNTER — Observation Stay
Admission: EM | Admit: 2022-02-28 | Discharge: 2022-03-01 | Disposition: A | Discharge status: Home / Self Care | Payer: Medicare Other | Attending: Obstetrics and Gynecology | Admitting: Obstetrics and Gynecology

## 2022-02-28 DIAGNOSIS — E871 Hypo-osmolality and hyponatremia: Secondary | ICD-10-CM | POA: Diagnosis not present

## 2022-02-28 DIAGNOSIS — Z79899 Other long term (current) drug therapy: Secondary | ICD-10-CM | POA: Diagnosis not present

## 2022-02-28 DIAGNOSIS — J441 Chronic obstructive pulmonary disease with (acute) exacerbation: Secondary | ICD-10-CM | POA: Diagnosis not present

## 2022-02-28 DIAGNOSIS — I509 Heart failure, unspecified: Secondary | ICD-10-CM | POA: Diagnosis not present

## 2022-02-28 DIAGNOSIS — Z20822 Contact with and (suspected) exposure to covid-19: Secondary | ICD-10-CM | POA: Diagnosis not present

## 2022-02-28 DIAGNOSIS — E039 Hypothyroidism, unspecified: Secondary | ICD-10-CM | POA: Diagnosis present

## 2022-02-28 DIAGNOSIS — R0602 Shortness of breath: Secondary | ICD-10-CM | POA: Diagnosis present

## 2022-02-28 DIAGNOSIS — I110 Hypertensive heart disease with heart failure: Secondary | ICD-10-CM | POA: Insufficient documentation

## 2022-02-28 DIAGNOSIS — Z87891 Personal history of nicotine dependence: Secondary | ICD-10-CM | POA: Diagnosis not present

## 2022-02-28 DIAGNOSIS — Z7982 Long term (current) use of aspirin: Secondary | ICD-10-CM | POA: Diagnosis not present

## 2022-02-28 DIAGNOSIS — I350 Nonrheumatic aortic (valve) stenosis: Secondary | ICD-10-CM

## 2022-02-28 DIAGNOSIS — I10 Essential (primary) hypertension: Secondary | ICD-10-CM | POA: Diagnosis present

## 2022-02-28 DIAGNOSIS — F419 Anxiety disorder, unspecified: Secondary | ICD-10-CM | POA: Diagnosis present

## 2022-02-28 LAB — CBC
HCT: 37.1 % (ref 36.0–46.0)
Hemoglobin: 12.4 g/dL (ref 12.0–15.0)
MCH: 30.3 pg (ref 26.0–34.0)
MCHC: 33.4 g/dL (ref 30.0–36.0)
MCV: 90.7 fL (ref 80.0–100.0)
Platelets: 404 10*3/uL — ABNORMAL HIGH (ref 150–400)
RBC: 4.09 MIL/uL (ref 3.87–5.11)
RDW: 13.2 % (ref 11.5–15.5)
WBC: 6.6 10*3/uL (ref 4.0–10.5)
nRBC: 0 % (ref 0.0–0.2)

## 2022-02-28 LAB — RESP PANEL BY RT-PCR (FLU A&B, COVID) ARPGX2
Influenza A by PCR: NEGATIVE
Influenza B by PCR: NEGATIVE
SARS Coronavirus 2 by RT PCR: NEGATIVE

## 2022-02-28 LAB — TROPONIN I (HIGH SENSITIVITY)
Troponin I (High Sensitivity): 10 ng/L (ref <18)
Troponin I (High Sensitivity): 20 ng/L — ABNORMAL HIGH (ref <18)

## 2022-02-28 LAB — BASIC METABOLIC PANEL
Anion gap: 8 (ref 5–15)
BUN: 14 mg/dL (ref 8–23)
CO2: 27 mmol/L (ref 22–32)
Calcium: 9.6 mg/dL (ref 8.9–10.3)
Chloride: 91 mmol/L — ABNORMAL LOW (ref 98–111)
Creatinine, Ser: 0.87 mg/dL (ref 0.44–1.00)
GFR, Estimated: >60 mL/min (ref >60)
Glucose, Bld: 120 mg/dL — ABNORMAL HIGH (ref 70–99)
Potassium: 3.9 mmol/L (ref 3.5–5.1)
Sodium: 126 mmol/L — ABNORMAL LOW (ref 135–145)

## 2022-02-28 MED ORDER — IPRATROPIUM-ALBUTEROL 0.5-2.5 (3) MG/3ML IN SOLN
3.0000 mL | Freq: Once | RESPIRATORY_TRACT | Status: AC
  Administered 2022-02-28: 3 mL via RESPIRATORY_TRACT
  Filled 2022-02-28: qty 3

## 2022-02-28 MED ORDER — SODIUM CHLORIDE 0.9 % IV BOLUS
500.0000 mL | Freq: Once | INTRAVENOUS | Status: AC
  Administered 2022-02-28: 500 mL via INTRAVENOUS

## 2022-02-28 MED ORDER — ISOSORBIDE MONONITRATE ER 30 MG PO TB24
15.0000 mg | ORAL_TABLET | Freq: Every day | ORAL | Status: DC
Start: 2022-02-28 — End: 2022-03-01
  Administered 2022-02-28 – 2022-03-01 (×2): 15 mg via ORAL
  Filled 2022-02-28 (×2): qty 1

## 2022-02-28 MED ORDER — MOMETASONE FURO-FORMOTEROL FUM 200-5 MCG/ACT IN AERO
2.0000 | INHALATION_SPRAY | Freq: Two times a day (BID) | RESPIRATORY_TRACT | Status: DC
  Administered 2022-03-01: 2 via RESPIRATORY_TRACT
  Filled 2022-02-28: qty 8.8

## 2022-02-28 MED ORDER — METHYLPREDNISOLONE SODIUM SUCC 125 MG IJ SOLR
125.0000 mg | Freq: Once | INTRAMUSCULAR | Status: AC
  Administered 2022-02-28: 125 mg via INTRAVENOUS
  Filled 2022-02-28: qty 2

## 2022-02-28 MED ORDER — HYDROXYZINE HCL 50 MG PO TABS
25.0000 mg | ORAL_TABLET | Freq: Three times a day (TID) | ORAL | Status: DC | PRN
  Administered 2022-02-28: 25 mg via ORAL
  Filled 2022-02-28: qty 1

## 2022-02-28 MED ORDER — IPRATROPIUM-ALBUTEROL 0.5-2.5 (3) MG/3ML IN SOLN
3.0000 mL | Freq: Four times a day (QID) | RESPIRATORY_TRACT | Status: DC
  Administered 2022-02-28 – 2022-03-01 (×5): 3 mL via RESPIRATORY_TRACT
  Filled 2022-02-28 (×5): qty 3

## 2022-02-28 MED ORDER — ONDANSETRON HCL 4 MG/2ML IJ SOLN: 4.0000 mg | Freq: Four times a day (QID) | INTRAMUSCULAR | Status: DC | PRN

## 2022-02-28 MED ORDER — HYDROXYZINE HCL 25 MG PO TABS
25.0000 mg | ORAL_TABLET | Freq: Once | ORAL | Status: AC
  Administered 2022-02-28: 25 mg via ORAL
  Filled 2022-02-28: qty 1

## 2022-02-28 MED ORDER — ASPIRIN EC 81 MG PO TBEC
81.0000 mg | DELAYED_RELEASE_TABLET | Freq: Every day | ORAL | Status: DC
Start: 2022-02-28 — End: 2022-03-01
  Administered 2022-02-28 – 2022-03-01 (×2): 81 mg via ORAL
  Filled 2022-02-28 (×2): qty 1

## 2022-02-28 MED ORDER — ACETAMINOPHEN 650 MG RE SUPP: 650.0000 mg | Freq: Four times a day (QID) | RECTAL | Status: DC | PRN

## 2022-02-28 MED ORDER — ENOXAPARIN SODIUM 40 MG/0.4ML IJ SOSY
40.0000 mg | PREFILLED_SYRINGE | INTRAMUSCULAR | Status: DC
  Administered 2022-02-28: 40 mg via SUBCUTANEOUS
  Filled 2022-02-28: qty 0.4

## 2022-02-28 MED ORDER — PANTOPRAZOLE SODIUM 40 MG PO TBEC
40.0000 mg | DELAYED_RELEASE_TABLET | Freq: Every day | ORAL | Status: DC
Start: 2022-02-28 — End: 2022-03-01
  Administered 2022-02-28 – 2022-03-01 (×2): 40 mg via ORAL
  Filled 2022-02-28 (×2): qty 1

## 2022-02-28 MED ORDER — ACETAMINOPHEN 325 MG PO TABS
650.0000 mg | ORAL_TABLET | Freq: Four times a day (QID) | ORAL | Status: DC | PRN
  Administered 2022-02-28: 650 mg via ORAL
  Filled 2022-02-28: qty 2

## 2022-02-28 MED ORDER — SIMVASTATIN 20 MG PO TABS
10.0000 mg | ORAL_TABLET | Freq: Every day | ORAL | Status: DC
  Administered 2022-02-28: 10 mg via ORAL
  Filled 2022-02-28: qty 1

## 2022-02-28 MED ORDER — LORAZEPAM 2 MG/ML IJ SOLN
0.5000 mg | Freq: Once | INTRAMUSCULAR | Status: AC
  Administered 2022-02-28: 0.5 mg via INTRAVENOUS
  Filled 2022-02-28: qty 1

## 2022-02-28 MED ORDER — VITAMIN B-12 1000 MCG PO TABS
1000.0000 ug | ORAL_TABLET | Freq: Every day | ORAL | Status: DC
  Administered 2022-02-28 – 2022-03-01 (×2): 1000 ug via ORAL
  Filled 2022-02-28 (×2): qty 1

## 2022-02-28 MED ORDER — LEVOTHYROXINE SODIUM 50 MCG PO TABS
75.0000 ug | ORAL_TABLET | Freq: Every day | ORAL | Status: DC
  Administered 2022-02-28 – 2022-03-01 (×2): 75 ug via ORAL
  Filled 2022-02-28 (×2): qty 1

## 2022-02-28 MED ORDER — ONDANSETRON HCL 4 MG PO TABS: 4.0000 mg | ORAL_TABLET | Freq: Four times a day (QID) | ORAL | Status: DC | PRN

## 2022-02-28 MED ORDER — FLUOXETINE HCL 20 MG PO CAPS
20.0000 mg | ORAL_CAPSULE | Freq: Every day | ORAL | Status: DC
Start: 2022-02-28 — End: 2022-03-01
  Administered 2022-02-28 – 2022-03-01 (×2): 20 mg via ORAL
  Filled 2022-02-28 (×2): qty 1

## 2022-02-28 MED ORDER — VITAMIN D 25 MCG (1000 UNIT) PO TABS
1000.0000 [IU] | ORAL_TABLET | Freq: Every day | ORAL | Status: DC
  Administered 2022-02-28 – 2022-03-01 (×2): 1000 [IU] via ORAL
  Filled 2022-02-28 (×2): qty 1

## 2022-02-28 MED ORDER — IBUPROFEN 400 MG PO TABS
400.0000 mg | ORAL_TABLET | Freq: Once | ORAL | Status: AC
  Administered 2022-02-28: 400 mg via ORAL
  Filled 2022-02-28: qty 1

## 2022-02-28 MED ORDER — ENALAPRIL MALEATE 20 MG PO TABS
20.0000 mg | ORAL_TABLET | Freq: Two times a day (BID) | ORAL | Status: DC
  Administered 2022-02-28 – 2022-03-01 (×3): 20 mg via ORAL
  Filled 2022-02-28 (×2): qty 1
  Filled 2022-02-28: qty 2

## 2022-02-28 MED ORDER — SODIUM CHLORIDE 0.9 % IV SOLN: Freq: Once | INTRAVENOUS | Status: AC

## 2022-02-28 MED ORDER — SODIUM CHLORIDE 0.9 % IV SOLN
500.0000 mg | Freq: Once | INTRAVENOUS | Status: AC
  Administered 2022-02-28: 500 mg via INTRAVENOUS
  Filled 2022-02-28: qty 5

## 2022-02-28 MED ORDER — PREDNISONE 20 MG PO TABS
40.0000 mg | ORAL_TABLET | Freq: Every day | ORAL | Status: DC
Start: 2022-03-01 — End: 2022-03-01
  Administered 2022-03-01: 40 mg via ORAL
  Filled 2022-02-28: qty 2

## 2022-02-28 MED ORDER — SODIUM CHLORIDE 0.9 % IV SOLN: INTRAVENOUS | Status: AC

## 2022-02-28 NOTE — ED Notes (Signed)
Patient ambulated around room. O2 stats stayed between 93-97% on room air. Patient does have increased work of breathing upon ambulation. ?

## 2022-02-28 NOTE — Assessment & Plan Note (Signed)
Stable ?Continue fluoxetine and hydroxyzine ?

## 2022-02-28 NOTE — Assessment & Plan Note (Signed)
Most likely related to HCTZ use as well as SSRIs ?Hold hydrochlorothiazide ?Judicious IV fluid hydration ?Repeat sodium levels in a.m. ?Consult nephrology if no improvement ?

## 2022-02-28 NOTE — Assessment & Plan Note (Addendum)
Stable ?Follow-up with cardiology as an outpatient ?

## 2022-02-28 NOTE — Assessment & Plan Note (Signed)
Place patient on as needed and scheduled bronchodilator therapy ?Place patient on inhaled and systemic steroids ?Assess for home oxygen need prior to discharge ?

## 2022-02-28 NOTE — ED Provider Notes (Signed)
? ?[facility] ?Provider Note ? ? ? Event Date/Time  ? First MD Initiated Contact with Patient 02/28/22 [phone]   ?  (approximate) ? ? ?History  ? ?Chest Pain ? ? ?HPI ? ?Linda Brady is a 82 y.o. female  who, per discharge summary dated 12/16/2021 had an admission to the hospital for COPD who also has history of CHF, GERD, who presents to the emergency department today because of concern for shortness of breath. The patient states that symptoms started about three days ago. Have progressively gotten worse. Has history of COPD and has tried her home breathing treatments without significant relief. The patient feels that she is having a hard time getting a good breath. Has some diffuse tenderness in her chest. Occasional cough. No fevers. Does have history of anxiety and feels like that is also playing a role in how she is feeling.  ? ?Physical Exam  ? ?Triage Vital Signs: ?ED Triage Vitals  ?Enc Vitals Group  ?   BP 02/28/22 0629 (!) 187/73  ?   Pulse Rate 02/28/22 0629 88  ?   Resp 02/28/22 0629 (!) 22  ?   Temp 02/28/22 0629 97.9 ?F (36.6 ?C)  ?   Temp Source 02/28/22 0629 Oral  ?   SpO2 02/28/22 0629 98 %  ?   Weight 02/28/22 0629 180 lb (81.6 kg)  ?   Height 02/28/22 0629 5\' 3"  (1.6 m)  ?   Head Circumference --   ?   Peak Flow --   ?   Pain Score 02/28/22 0628 2  ? ?Most recent vital signs: ?Vitals:  ? 02/28/22 0629  ?BP: (!) 187/73  ?Pulse: 88  ?Resp: (!) 22  ?Temp: 97.9 ?F (36.6 ?C)  ?SpO2: 98%  ? ? ?General: Awake, no distress.  ?CV:  Good peripheral perfusion.  ?Resp:  Slightly increased effort. Poor air movement with some wheezing noted anteriorly. ?Abd:  No distention.  ?MSK:  Trace lower extremity edema. ? ? ?ED Results / Procedures / Treatments  ? ?Labs ?(all labs ordered are listed, but only abnormal results are displayed) ?Labs Reviewed  ?BASIC METABOLIC PANEL - Abnormal; Notable for the following components:  ?    Result Value  ? Sodium 126 (*)   ? Chloride 91 (*)   ? Glucose,  Bld 120 (*)   ? All other components within normal limits  ?CBC - Abnormal; Notable for the following components:  ? Platelets 404 (*)   ? All other components within normal limits  ?TROPONIN I (HIGH SENSITIVITY)  ? ? ? ?EKG ? ?I04/01/23, attending physician, personally viewed and interpreted this EKG ? ?EKG Time: 0631 ?Rate: 93 ?Rhythm: sinus rhythm ?Axis: right axis deviation ?Intervals: qtc 456 ?QRS: narrow, q waves v1, v2, v3 ?ST changes: no st elevation ?Impression: abnormal ekg ? ?RADIOLOGY ?I independently interpreted and visualized the CXR. My interpretation: No pneumonia. No pneumothorax. ?Radiology interpretation:  ?  ?IMPRESSION:  ?No active disease.  ?   ? ? ? ?PROCEDURES: ? ?Critical Care performed: No ? ?Procedures ? ? ?MEDICATIONS ORDERED IN ED: ?Medications  ?hydrOXYzine (ATARAX) tablet 25 mg (25 mg Oral Given 02/28/22 0645)  ? ? ? ?IMPRESSION / MDM / ASSESSMENT AND PLAN / ED COURSE  ?I reviewed the triage vital signs and the nursing notes. ?             ?               ? ?  Differential diagnosis includes, but is not limited to, pneumonia, COPD, COVID, pneumothorax, ACS. ? ?Patient presented to the emergency department today because of concerns for shortness of breath over the past 3 days.  Patient does have history of COPD and on exam patient has poor air movement with anterior wheezing present.  Patient was given steroids as well as DuoNeb treatments here in the emergency department.  After treatment and upon ambulation while the patient did not have hypoxia as she did have increased work of breathing.  Additionally patient's blood work shows slight hyponatremia.  Patient does have some baseline hyponatremia.  Given continued shortness of breath I do feel she would benefit from further management.  Discussed with Dr. Joylene Igo with the hospitalist service who will plan on admission. ? ? ?FINAL CLINICAL IMPRESSION(S) / ED DIAGNOSES  ? ?Final diagnoses:  ?Shortness of breath  ?COPD exacerbation  (HCC)  ?Hyponatremia  ? ? ?Note:  This document was prepared using Dragon voice recognition software and may include unintentional dictation errors. ? ?  ?Phineas Semen, MD ?02/28/22 1036 ? ?

## 2022-02-28 NOTE — ED Triage Notes (Signed)
Pt presents to ER via ems from home c/o chest pain and sob for a few days.  Pt states chest pain is located in center of chest and radiated to her BIL arms ande shoulders.  Pt states she feels like she has been unable to get a deep breath.  Pt denies cardiac hx.  Does endorse hx of COPD and anxiety.  States she took her albuterol inhaler at home w/minimal relief.  Pt is A&O x4 at this time in NAD.    ?

## 2022-02-28 NOTE — H&P (Signed)
?History and Physical  ? ? ?Patient: Linda Brady LEX:[id number] DOB: 1941-04-05 ?DOA: 02/28/2022 ?DOS: the patient was seen and examined on 02/28/2022 ?PCP: [name], MD  ?Patient coming from: Home ? ?Chief Complaint:  ?Chief Complaint  ?Patient presents with  ? Chest Pain  ? ?HPI: Linda Brady is a 81 y.o. female with medical history significant for COPD, hypertension and thyroid disease who presents to the ER for evaluation of a 3-day history of chest pain mostly midsternal with radiation to her shoulders and back.  Pain is pleuritic in nature and is associated with a cough that is nonproductive.  She also complains of shortness of breath.  She used albuterol inhaler at home without any significant improvement. ?Shortness of breath is usually worse with exertion.  She denies having any fever, no chills or any sick contacts. ?She denies having any abdominal pain, no dizziness, no lightheadedness, no leg swelling, no changes in her bowel habits or any urinary symptoms ? ?Review of Systems: As mentioned in the history of present illness. All other systems reviewed and are negative. ?Past Medical History:  ?Diagnosis Date  ? COPD (chronic obstructive pulmonary disease) (HCC)   ? Hypertension   ? Thyroid disease   ? ?Past Surgical History:  ?Procedure Laterality Date  ? BREAST CYST ASPIRATION Left 10 plus yrs  ? benign-twice  ? LEFT HEART CATH AND CORONARY ANGIOGRAPHY N/A 09/11/2020  ? Procedure: LEFT HEART CATH AND CORONARY ANGIOGRAPHY;  Surgeon: Marcina Millard, MD;  Location: [facility];  Service: Cardiovascular;  Laterality: N/A;  ? ?Social History:  reports that she has quit smoking. Her smoking use included cigarettes. She has a 6.00 pack-year smoking history. She has never used smokeless tobacco. She reports that she does not currently use alcohol. She reports that she does not currently use drugs. ? ?Allergies  ?Allergen Reactions  ? Naproxen Sodium Anaphylaxis  ? ? ?Family History   ?Problem Relation Age of Onset  ? Breast cancer Neg Hx   ? ? ?Prior to Admission medications   ?Medication Sig Start Date End Date Taking? Authorizing Provider  ?albuterol (VENTOLIN HFA) 108 (90 Base) MCG/ACT inhaler Inhale 2 puffs into the lungs every 4 (four) hours as needed for wheezing or shortness of breath. 12/16/21  Yes Esaw Grandchild A, DO  ?aspirin 81 MG EC tablet Take 81 mg by mouth daily.   Yes [provider]  ?cholecalciferol (VITAMIN D3) 25 MCG (1000 UNIT) tablet Take 1,000 Units by mouth daily.   Yes [provider]  ?enalapril (VASOTEC) 20 MG tablet Take 20 mg by mouth 2 (two) times daily. 04/13/20  Yes [provider]  ?FLUoxetine (PROZAC) 20 MG capsule Take 20 mg by mouth daily. 03/14/20 02/28/22 Yes [provider]  ?fluticasone-salmeterol (ADVAIR) 250-50 MCG/ACT AEPB Inhale 1 puff into the lungs in the morning and at bedtime. 06/20/21  Yes [provider]  ?hydrochlorothiazide (HYDRODIURIL) 25 MG tablet Take 0.5 tablets (12.5 mg total) by mouth daily. 12/16/21  Yes Esaw Grandchild A, DO  ?hydrOXYzine (ATARAX/VISTARIL) 25 MG tablet Take 25 mg by mouth 3 (three) times daily as needed for anxiety. 12/13/19  Yes [provider]  ?isosorbide mononitrate (IMDUR) 30 MG 24 hr tablet Take 0.5 tablets (15 mg total) by mouth daily. 12/16/21  Yes Esaw Grandchild A, DO  ?levothyroxine (SYNTHROID) 75 MCG tablet Take 75 mcg by mouth daily. 05/02/20  Yes [provider]  ?pantoprazole (PROTONIX) 40 MG tablet Take 1 tablet (40 mg total)  by mouth daily. 12/24/21  Yes Debbe Odea, MD  ?simvastatin (ZOCOR) 20 MG tablet Take 10 mg by mouth daily. 04/02/21  Yes [provider]  ?SPIRIVA HANDIHALER 18 MCG inhalation capsule Place 1 capsule into inhaler and inhale daily. 08/22/20  Yes [provider]  ?vitamin B-12 (CYANOCOBALAMIN) 1000 MCG tablet Take 1,000 mcg by mouth daily.   Yes [provider]  ?azithromycin (ZITHROMAX) 250 MG  tablet Take 1 tablet (250 mg) by mouth once daily for 2 days ?Patient not taking: Reported on 02/28/2022 12/16/21   Esaw Grandchild A, DO  ?nitroGLYCERIN (NITROSTAT) 0.4 MG SL tablet Place 1 tablet (0.4 mg total) under the tongue every 5 (five) minutes as needed for chest pain. For a maximum of 3 doses. ?Patient not taking: Reported on 02/28/2022 12/24/21 03/24/22  Debbe Odea, MD  ? ? ?Physical Exam: ?Vitals:  ? 02/28/22 0730 02/28/22 0830 02/28/22 0930 02/28/22 1100  ?BP: (!) 157/81 131/75 (!) 115/54 (!) 149/75  ?Pulse: 85 (!) 105 (!) 103 98  ?Resp: (!) 22 18 (!) 21 20  ?Temp:      ?TempSrc:      ?SpO2: 98% 94% 92% 97%  ?Weight:      ?Height:      ? ?Physical Exam ?Vitals and nursing note reviewed.  ?Constitutional:   ?   Appearance: She is well-developed and normal weight.  ?HENT:  ?   Head: Normocephalic and atraumatic.  ?Eyes:  ?   Pupils: Pupils are equal, round, and reactive to light.  ?Cardiovascular:  ?   Rate and Rhythm: Tachycardia present.  ?   Heart sounds: Normal heart sounds.  ?Pulmonary:  ?   Effort: Pulmonary effort is normal.  ?   Breath sounds: Examination of the right-upper field reveals wheezing. Examination of the left-upper field reveals wheezing. Examination of the right-middle field reveals wheezing. Examination of the left-middle field reveals wheezing. Examination of the right-lower field reveals wheezing. Examination of the left-lower field reveals wheezing. Wheezing present.  ?Abdominal:  ?   General: Bowel sounds are normal.  ?   Palpations: Abdomen is soft.  ?Musculoskeletal:     ?   General: Normal range of motion.  ?   Cervical back: Normal range of motion and neck supple.  ?Skin: ?   General: Skin is warm and dry.  ?Neurological:  ?   General: No focal deficit present.  ?   Mental Status: She is alert.  ?Psychiatric:     ?   Mood and Affect: Mood is anxious.  ? ? ?Data Reviewed: ?Relevant notes from primary care and specialist visits, past discharge summaries as available in EHR,  including Care Everywhere. ?Prior diagnostic testing as pertinent to current admission diagnoses ?Updated medications and problem lists for reconciliation ?ED course, including vitals, labs, imaging, treatment and response to treatment ?Triage notes, nursing and pharmacy notes and ED provider's notes ?Notable results as noted in HPI ?Labs reviewed and essentially negative except for sodium 126, potassium 3.9, chloride 91, bicarb 27, glucose 120, troponin 10 >> 20,  ?Respiratory viral panel is negative ?Chest x-ray reviewed by me shows no evidence of acute cardiopulmonary disease ?Twelve-lead EKG reviewed by me shows sinus rhythm with prolonged PR interval ?There are no new results to review at this time. ? ?Assessment and Plan: ?* COPD with acute exacerbation (HCC) ?Place patient on as needed and scheduled bronchodilator therapy ?Place patient on inhaled and systemic steroids ?Assess for home oxygen need prior to discharge ? ?Hyponatremia ?Most likely  related to HCTZ use as well as SSRIs ?Hold hydrochlorothiazide ?Judicious IV fluid hydration ?Repeat sodium levels in a.m. ?Consult nephrology if no improvement ? ?Essential hypertension ?Blood pressure is stable ?Continue nitrates and enalapril ? ?Anxiety ?Stable ?Continue fluoxetine and hydroxyzine ? ?Aortic stenosis, moderate ?Stable ?Follow up with cardiology as an outpatient ? ?Hypothyroidism ?Stable ?Continue Synthroid ? ? ? ? ? Advance Care Planning:   Code Status: Full Code  ? ?Consults: None ? ?Family Communication: Greater than 50% of time was spent discussing patient's condition and plan of care with her at the bedside.  All questions and concerns have been addressed.  She verbalizes understanding and agrees with the plan. ? ?Severity of Illness: ?The appropriate patient status for this patient is INPATIENT. Inpatient status is judged to be reasonable and necessary in order to provide the required intensity of service to ensure the patient's safety. The  patient's presenting symptoms, physical exam findings, and initial radiographic and laboratory data in the context of their chronic comorbidities is felt to place them at high risk for further clinical dete

## 2022-02-28 NOTE — Assessment & Plan Note (Signed)
Stable Continue Synthroid 

## 2022-02-28 NOTE — Assessment & Plan Note (Signed)
Blood pressure is stable ?Continue nitrates and enalapril ?

## 2022-03-01 DIAGNOSIS — J441 Chronic obstructive pulmonary disease with (acute) exacerbation: Secondary | ICD-10-CM | POA: Diagnosis not present

## 2022-03-01 DIAGNOSIS — M810 Age-related osteoporosis without current pathological fracture: Secondary | ICD-10-CM | POA: Insufficient documentation

## 2022-03-01 LAB — BASIC METABOLIC PANEL
Anion gap: 8 (ref 5–15)
BUN: 13 mg/dL (ref 8–23)
CO2: 25 mmol/L (ref 22–32)
Calcium: 9.1 mg/dL (ref 8.9–10.3)
Chloride: 95 mmol/L — ABNORMAL LOW (ref 98–111)
Creatinine, Ser: 0.88 mg/dL (ref 0.44–1.00)
GFR, Estimated: >60 mL/min (ref >60)
Glucose, Bld: 111 mg/dL — ABNORMAL HIGH (ref 70–99)
Potassium: 3.9 mmol/L (ref 3.5–5.1)
Sodium: 128 mmol/L — ABNORMAL LOW (ref 135–145)

## 2022-03-01 LAB — CBC
HCT: 32.2 % — ABNORMAL LOW (ref 36.0–46.0)
Hemoglobin: 11 g/dL — ABNORMAL LOW (ref 12.0–15.0)
MCH: 31.5 pg (ref 26.0–34.0)
MCHC: 34.2 g/dL (ref 30.0–36.0)
MCV: 92.3 fL (ref 80.0–100.0)
Platelets: 378 10*3/uL (ref 150–400)
RBC: 3.49 MIL/uL — ABNORMAL LOW (ref 3.87–5.11)
RDW: 13.6 % (ref 11.5–15.5)
WBC: 9.1 10*3/uL (ref 4.0–10.5)
nRBC: 0 % (ref 0.0–0.2)

## 2022-03-01 LAB — TROPONIN I (HIGH SENSITIVITY)
Troponin I (High Sensitivity): 45 ng/L — ABNORMAL HIGH (ref <18)
Troponin I (High Sensitivity): 46 ng/L — ABNORMAL HIGH (ref <18)

## 2022-03-01 MED ORDER — PREDNISONE 20 MG PO TABS: 40.0000 mg | ORAL_TABLET | Freq: Every day | ORAL | 0 refills | Status: DC

## 2022-03-01 MED ORDER — HYDROXYZINE HCL 50 MG PO TABS
25.0000 mg | ORAL_TABLET | Freq: Four times a day (QID) | ORAL | Status: DC | PRN
  Filled 2022-03-01: qty 1

## 2022-03-01 NOTE — Discharge Summary (Signed)
Linda LipsShirley A Kowalczyk [id number] DOB: February 17, 1941 DOA: 02/28/2022 ? ?PCP: Jerl MinaHedrick, James, MD ? ?Admit date: 02/28/2022 ?Discharge date: 03/01/2022 ? ?Time spent: 35 minutes ? ?Recommendations for Outpatient Follow-up:  ?Pcp and cardiology f/u  ?BMP 1 week to assess sodium level ? ? ? ?Discharge Diagnoses:  ?Principal Problem: ?  COPD with acute exacerbation (HCC) ?Active Problems: ?  Hyponatremia ?  Essential hypertension ?  Hypothyroidism ?  Aortic stenosis, moderate ?  Anxiety ? ? ?Discharge Condition: improved ? ?Diet recommendation: heart healthy ? ?Filed Weights  ? 02/28/22 [id number] 02/28/22 2127  ?Weight: 81.6 kg 88.2 kg  ? ? ?History of present illness:  ?From admission h and p: ?Linda Brady is a 81 y.o. female with medical history significant for COPD, hypertension and thyroid disease who presents to the ER for evaluation of a 3-day history of chest pain mostly midsternal with radiation to her shoulders and back.  Pain is pleuritic in nature and is associated with a cough that is nonproductive.  She also complains of shortness of breath.  She used albuterol inhaler at home without any significant improvement. ?Shortness of breath is usually worse with exertion.  She denies having any fever, no chills or any sick contacts. ?She denies having any abdominal pain, no dizziness, no lightheadedness, no leg swelling, no changes in her bowel habits or any urinary symptoms ? ?Hospital Course:  ?Patient admitted to hospital for COPD exacerbation. This was a mild exacerbation, no hypoxia. We treated with steroids and she improved, ambulated without hypoxia or significant difficulty. Will d/c with oral steroids. She did complain of chest pain and panic attacks which she says are chronic. No chest pain since arrival to the ED and EKG without ischemic changes. Mild troponin elevation that plateaued in mid-40s, think likely demand. Does have history of CAD so advised close cardiology f/u. For panic advised further f/u with  PCP. Also found to be hyponatremic. This appears to be a chronic problem made worse by recent institution of HCTZ. I advised holding this med and close f/u with pcp to check BP and to assess sodium level. If remains low consider discontinuation of SSRI.  ? ?Procedures: ?none  ? ?Consultations: ?none ? ?Discharge Exam: ?Vitals:  ? 03/01/22 0805 03/01/22 1107  ?BP: 138/62 128/60  ?Pulse: 86 86  ?Resp: 17 17  ?Temp: 97.7 ?F (36.5 ?C) 97.8 ?F (36.6 ?C)  ?SpO2: 97% 98%  ? ? ?General: NAD ?Cardiovascular: rrr, no murmur ?Respiratory: no wheeze, few scattered rhonchi ?Ext: no edema ? ?Discharge Instructions ? ? ?Discharge Instructions   ? ? Diet - low sodium heart healthy   Complete by: As directed ?  ? Diet - low sodium heart healthy   Complete by: As directed ?  ? Increase activity slowly   Complete by: As directed ?  ? Increase activity slowly   Complete by: As directed ?  ? ?  ? ?Allergies as of 03/01/2022   ? ?   Reactions  ? Naproxen Sodium Anaphylaxis  ? ?  ? ?  ?Medication List  ?  ? ?STOP taking these medications   ? ?hydrochlorothiazide 25 MG tablet ?Commonly known as: HYDRODIURIL ?  ? ?  ? ?TAKE these medications   ? ?albuterol 108 (90 Base) MCG/ACT inhaler ?Commonly known as: VENTOLIN HFA ?Inhale 2 puffs into the lungs every 4 (four) hours as needed for wheezing or shortness of breath. ?  ?aspirin 81 MG EC tablet ?Take 81 mg by mouth daily. ?  ?azithromycin  250 MG tablet ?Commonly known as: Zithromax ?Take 1 tablet (250 mg) by mouth once daily for 2 days ?  ?cholecalciferol 25 MCG (1000 UNIT) tablet ?Commonly known as: VITAMIN D3 ?Take 1,000 Units by mouth daily. ?  ?enalapril 20 MG tablet ?Commonly known as: VASOTEC ?Take 20 mg by mouth 2 (two) times daily. ?  ?FLUoxetine 20 MG capsule ?Commonly known as: PROZAC ?Take 20 mg by mouth daily. ?  ?fluticasone-salmeterol 250-50 MCG/ACT Aepb ?Commonly known as: ADVAIR ?Inhale 1 puff into the lungs in the morning and at bedtime. ?  ?hydrOXYzine 25 MG tablet ?Commonly  known as: ATARAX ?Take 25 mg by mouth 3 (three) times daily as needed for anxiety. ?  ?isosorbide mononitrate 30 MG 24 hr tablet ?Commonly known as: IMDUR ?Take 0.5 tablets (15 mg total) by mouth daily. ?  ?levothyroxine 75 MCG tablet ?Commonly known as: SYNTHROID ?Take 75 mcg by mouth daily. ?  ?nitroGLYCERIN 0.4 MG SL tablet ?Commonly known as: NITROSTAT ?Place 1 tablet (0.4 mg total) under the tongue every 5 (five) minutes as needed for chest pain. For a maximum of 3 doses. ?  ?pantoprazole 40 MG tablet ?Commonly known as: PROTONIX ?Take 1 tablet (40 mg total) by mouth daily. ?  ?predniSONE 20 MG tablet ?Commonly known as: DELTASONE ?Take 2 tablets (40 mg total) by mouth daily with breakfast. ?Start taking on: March 02, 2022 ?  ?simvastatin 20 MG tablet ?Commonly known as: ZOCOR ?Take 10 mg by mouth daily. ?  ?Spiriva HandiHaler 18 MCG inhalation capsule ?Generic drug: tiotropium ?Place 1 capsule into inhaler and inhale daily. ?  ?vitamin B-12 1000 MCG tablet ?Commonly known as: CYANOCOBALAMIN ?Take 1,000 mcg by mouth daily. ?  ? ?  ? ?Allergies  ?Allergen Reactions  ? Naproxen Sodium Anaphylaxis  ? ? Follow-up Information   ? ? [name], MD Follow up.   ?Specialty: Family Medicine ?Contact information: ?[address] ?[facility] ?[place] [address] ?[phone] ? ? ?  ?  ? ? Debbe Odea, MD .   ?Specialties: Cardiology, Radiology ?Contact information: ?[address] ?[place] [address] ?[phone] ? ? ?  ?  ? ?  ?  ? ?  ? ? ? ?The results of significant diagnostics from this hospitalization (including imaging, microbiology, ancillary and laboratory) are listed below for reference.   ? ?Significant Diagnostic Studies: ?DG Chest Portable 1 View ? ?Result Date: 02/28/2022 ?CLINICAL DATA:  Shortness of breath. EXAM: PORTABLE CHEST 1 VIEW COMPARISON:  12/14/2021 FINDINGS: 0638 hours. The lungs are clear without focal pneumonia, edema, pneumothorax or pleural effusion. The  cardiopericardial silhouette is within normal limits for size. The visualized bony structures of the thorax are unremarkable. Telemetry leads overlie the chest. IMPRESSION: No active disease. Electronically Signed   By: Kennith Center M.D.   On: 02/28/2022 07:21   ? ?Microbiology: ?Recent Results (from the past 240 hour(s))  ?Resp Panel by RT-PCR (Flu A&B, Covid) Nasopharyngeal Swab     Status: None  ? Collection Time: 02/28/22  7:41 AM  ? Specimen: Nasopharyngeal Swab; Nasopharyngeal(NP) swabs in vial transport medium  ?Result Value Ref Range Status  ? SARS Coronavirus 2 by RT PCR NEGATIVE NEGATIVE Final  ?  Comment: (NOTE) ?SARS-CoV-2 target nucleic acids are NOT DETECTED. ? ?The SARS-CoV-2 RNA is generally detectable in upper respiratory ?specimens during the acute phase of infection. The lowest ?concentration of SARS-CoV-2 viral copies this assay can detect is ?138 copies/mL. A negative result does not preclude SARS-Cov-2 ?infection  and should not be used as the sole basis for treatment or ?other patient management decisions. A negative result may occur with  ?improper specimen collection/handling, submission of specimen other ?than nasopharyngeal swab, presence of viral mutation(s) within the ?areas targeted by this assay, and inadequate number of viral ?copies(<138 copies/mL). A negative result must be combined with ?clinical observations, patient history, and epidemiological ?information. The expected result is Negative. ? ?Fact Sheet for Patients:  ?BloggerCourse.com ? ?Fact Sheet for Healthcare Providers:  ?SeriousBroker.it ? ?This test is no t yet approved or cleared by the [place] FDA and  ?has been authorized for detection and/or diagnosis of SARS-CoV-2 by ?FDA under an Emergency Use Authorization (EUA). This EUA will remain  ?in effect (meaning this test can be used) for the duration of the ?COVID-19 declaration under Section 564(b)(1) of the Act,  21 ?U.S.C.section 360bbb-3(b)(1), unless the authorization is terminated  ?or revoked sooner.  ? ? ?  ? Influenza A by PCR NEGATIVE NEGATIVE Final  ? Influenza B by PCR NEGATIVE NEGATIVE Final  ?  Comment: (NOTE) ?T

## 2022-03-01 NOTE — Plan of Care (Signed)

## 2022-03-01 NOTE — Care Management CC44 (Signed)
Condition Code 44 Documentation Completed ? ?Patient Details  ?Name: Linda Brady ?MRN: [id number] ?Date of Birth: [birth year] ? ? ?Condition Code 44 given:  Yes ?Patient signature on Condition Code 44 notice:  Yes ?Documentation of 2 MD's agreement:  Yes ?Code 44 added to claim:  Yes ? ?Reviewed with patient via phone with patient permission. ?Copy provided for patient's records.  ? ?Lashya Passe [name], LCSW ?03/01/2022, 3:00 PM ? ?

## 2022-04-12 ENCOUNTER — Other Ambulatory Visit: Payer: Self-pay

## 2022-04-12 ENCOUNTER — Emergency Department: Payer: Medicare Other

## 2022-04-12 ENCOUNTER — Inpatient Hospital Stay
Admission: EM | Admit: 2022-04-12 | Discharge: 2022-04-14 | DRG: 177 | Disposition: A | Discharge status: Home / Self Care | Payer: Medicare Other | Attending: Hospitalist | Admitting: Hospitalist

## 2022-04-12 ENCOUNTER — Encounter: Payer: Self-pay | Admitting: Internal Medicine

## 2022-04-12 DIAGNOSIS — K219 Gastro-esophageal reflux disease without esophagitis: Secondary | ICD-10-CM | POA: Diagnosis present

## 2022-04-12 DIAGNOSIS — Z7982 Long term (current) use of aspirin: Secondary | ICD-10-CM

## 2022-04-12 DIAGNOSIS — I10 Essential (primary) hypertension: Secondary | ICD-10-CM | POA: Diagnosis present

## 2022-04-12 DIAGNOSIS — I350 Nonrheumatic aortic (valve) stenosis: Secondary | ICD-10-CM | POA: Diagnosis present

## 2022-04-12 DIAGNOSIS — U071 COVID-19: Principal | ICD-10-CM

## 2022-04-12 DIAGNOSIS — E039 Hypothyroidism, unspecified: Secondary | ICD-10-CM | POA: Diagnosis present

## 2022-04-12 DIAGNOSIS — E872 Acidosis, unspecified: Secondary | ICD-10-CM | POA: Diagnosis present

## 2022-04-12 DIAGNOSIS — Z79890 Hormone replacement therapy (postmenopausal): Secondary | ICD-10-CM | POA: Diagnosis not present

## 2022-04-12 DIAGNOSIS — E876 Hypokalemia: Secondary | ICD-10-CM | POA: Diagnosis present

## 2022-04-12 DIAGNOSIS — R079 Chest pain, unspecified: Secondary | ICD-10-CM

## 2022-04-12 DIAGNOSIS — R0602 Shortness of breath: Secondary | ICD-10-CM

## 2022-04-12 DIAGNOSIS — J1282 Pneumonia due to coronavirus disease 2019: Secondary | ICD-10-CM | POA: Diagnosis present

## 2022-04-12 DIAGNOSIS — Z886 Allergy status to analgesic agent status: Secondary | ICD-10-CM

## 2022-04-12 DIAGNOSIS — Z79899 Other long term (current) drug therapy: Secondary | ICD-10-CM

## 2022-04-12 DIAGNOSIS — J441 Chronic obstructive pulmonary disease with (acute) exacerbation: Secondary | ICD-10-CM | POA: Diagnosis present

## 2022-04-12 DIAGNOSIS — J440 Chronic obstructive pulmonary disease with acute lower respiratory infection: Secondary | ICD-10-CM | POA: Diagnosis present

## 2022-04-12 DIAGNOSIS — Z87891 Personal history of nicotine dependence: Secondary | ICD-10-CM

## 2022-04-12 DIAGNOSIS — Z28310 Unvaccinated for covid-19: Secondary | ICD-10-CM

## 2022-04-12 DIAGNOSIS — K449 Diaphragmatic hernia without obstruction or gangrene: Secondary | ICD-10-CM | POA: Diagnosis present

## 2022-04-12 DIAGNOSIS — R7989 Other specified abnormal findings of blood chemistry: Secondary | ICD-10-CM

## 2022-04-12 LAB — BASIC METABOLIC PANEL
Anion gap: 10 (ref 5–15)
BUN: <5 mg/dL — ABNORMAL LOW (ref 8–23)
CO2: 25 mmol/L (ref 22–32)
Calcium: 9.4 mg/dL (ref 8.9–10.3)
Chloride: 100 mmol/L (ref 98–111)
Creatinine, Ser: 0.83 mg/dL (ref 0.44–1.00)
GFR, Estimated: >60 mL/min (ref >60)
Glucose, Bld: 112 mg/dL — ABNORMAL HIGH (ref 70–99)
Potassium: 3.3 mmol/L — ABNORMAL LOW (ref 3.5–5.1)
Sodium: 135 mmol/L (ref 135–145)

## 2022-04-12 LAB — CBC WITH DIFFERENTIAL/PLATELET
Abs Immature Granulocytes: 0.01 10*3/uL (ref 0.00–0.07)
Basophils Absolute: 0 10*3/uL (ref 0.0–0.1)
Basophils Relative: 0 %
Eosinophils Absolute: 0 10*3/uL (ref 0.0–0.5)
Eosinophils Relative: 1 %
HCT: 37 % (ref 36.0–46.0)
Hemoglobin: 12.2 g/dL (ref 12.0–15.0)
Immature Granulocytes: 0 %
Lymphocytes Relative: 32 %
Lymphs Abs: 1.2 10*3/uL (ref 0.7–4.0)
MCH: 29.2 pg (ref 26.0–34.0)
MCHC: 33 g/dL (ref 30.0–36.0)
MCV: 88.5 fL (ref 80.0–100.0)
Monocytes Absolute: 0.6 10*3/uL (ref 0.1–1.0)
Monocytes Relative: 16 %
Neutro Abs: 1.9 10*3/uL (ref 1.7–7.7)
Neutrophils Relative %: 51 %
Platelets: 344 10*3/uL (ref 150–400)
RBC: 4.18 MIL/uL (ref 3.87–5.11)
RDW: 13.3 % (ref 11.5–15.5)
WBC: 3.6 10*3/uL — ABNORMAL LOW (ref 4.0–10.5)
nRBC: 0 % (ref 0.0–0.2)

## 2022-04-12 LAB — TROPONIN I (HIGH SENSITIVITY)
Troponin I (High Sensitivity): 11 ng/L (ref <18)
Troponin I (High Sensitivity): 14 ng/L (ref <18)

## 2022-04-12 LAB — D-DIMER, QUANTITATIVE: D-Dimer, Quant: 1.13 ug/mL-FEU — ABNORMAL HIGH (ref 0.00–0.50)

## 2022-04-12 LAB — LACTIC ACID, PLASMA
Lactic Acid, Venous: 2.3 mmol/L (ref 0.5–1.9)
Lactic Acid, Venous: 2.2 mmol/L (ref 0.5–1.9)
Lactic Acid, Venous: 4 mmol/L (ref 0.5–1.9)
Lactic Acid, Venous: 4.3 mmol/L (ref 0.5–1.9)

## 2022-04-12 LAB — MAGNESIUM: Magnesium: 1.9 mg/dL (ref 1.7–2.4)

## 2022-04-12 LAB — RESP PANEL BY RT-PCR (FLU A&B, COVID) ARPGX2
Influenza A by PCR: NEGATIVE
Influenza B by PCR: NEGATIVE
SARS Coronavirus 2 by RT PCR: POSITIVE — AB

## 2022-04-12 LAB — PROCALCITONIN: Procalcitonin: <0.1 ng/mL

## 2022-04-12 LAB — BRAIN NATRIURETIC PEPTIDE: B Natriuretic Peptide: 128.4 pg/mL — ABNORMAL HIGH (ref 0.0–100.0)

## 2022-04-12 MED ORDER — METHYLPREDNISOLONE SODIUM SUCC 40 MG IJ SOLR
40.0000 mg | Freq: Two times a day (BID) | INTRAMUSCULAR | Status: AC
  Administered 2022-04-12 – 2022-04-13 (×2): 40 mg via INTRAVENOUS
  Filled 2022-04-12 (×2): qty 1

## 2022-04-12 MED ORDER — POTASSIUM CHLORIDE CRYS ER 20 MEQ PO TBCR
40.0000 meq | EXTENDED_RELEASE_TABLET | Freq: Once | ORAL | Status: AC
  Administered 2022-04-12: 40 meq via ORAL
  Filled 2022-04-12: qty 2

## 2022-04-12 MED ORDER — ASCORBIC ACID 500 MG PO TABS
500.0000 mg | ORAL_TABLET | Freq: Every day | ORAL | Status: DC
  Administered 2022-04-12 – 2022-04-14 (×3): 500 mg via ORAL
  Filled 2022-04-12 (×3): qty 1

## 2022-04-12 MED ORDER — FLUOXETINE HCL 20 MG PO CAPS
20.0000 mg | ORAL_CAPSULE | Freq: Every day | ORAL | Status: DC
  Administered 2022-04-13 – 2022-04-14 (×2): 20 mg via ORAL
  Filled 2022-04-12 (×2): qty 1

## 2022-04-12 MED ORDER — HYDROXYZINE HCL 25 MG PO TABS
25.0000 mg | ORAL_TABLET | Freq: Three times a day (TID) | ORAL | Status: DC | PRN
  Administered 2022-04-12 – 2022-04-13 (×3): 25 mg via ORAL
  Filled 2022-04-12 (×5): qty 1

## 2022-04-12 MED ORDER — SODIUM CHLORIDE 0.9% FLUSH
3.0000 mL | Freq: Two times a day (BID) | INTRAVENOUS | Status: DC
  Administered 2022-04-12 – 2022-04-13 (×3): 3 mL via INTRAVENOUS

## 2022-04-12 MED ORDER — VITAMIN B-12 1000 MCG PO TABS
1000.0000 ug | ORAL_TABLET | Freq: Every day | ORAL | Status: DC
  Administered 2022-04-13 – 2022-04-14 (×2): 1000 ug via ORAL
  Filled 2022-04-12 (×2): qty 1

## 2022-04-12 MED ORDER — ZINC SULFATE 220 (50 ZN) MG PO CAPS
220.0000 mg | ORAL_CAPSULE | Freq: Every day | ORAL | Status: DC
  Administered 2022-04-12 – 2022-04-14 (×3): 220 mg via ORAL
  Filled 2022-04-12 (×3): qty 1

## 2022-04-12 MED ORDER — ENALAPRIL MALEATE 10 MG PO TABS
20.0000 mg | ORAL_TABLET | Freq: Two times a day (BID) | ORAL | Status: DC
  Administered 2022-04-12 – 2022-04-14 (×4): 20 mg via ORAL
  Filled 2022-04-12 (×4): qty 2

## 2022-04-12 MED ORDER — LORAZEPAM 2 MG/ML IJ SOLN
0.5000 mg | Freq: Once | INTRAMUSCULAR | Status: AC
  Administered 2022-04-12: 0.5 mg via INTRAVENOUS
  Filled 2022-04-12: qty 1

## 2022-04-12 MED ORDER — ASPIRIN EC 81 MG PO TBEC
81.0000 mg | DELAYED_RELEASE_TABLET | Freq: Every day | ORAL | Status: DC
  Administered 2022-04-13 – 2022-04-14 (×2): 81 mg via ORAL
  Filled 2022-04-12 (×2): qty 1

## 2022-04-12 MED ORDER — ONDANSETRON HCL 4 MG/2ML IJ SOLN
4.0000 mg | Freq: Four times a day (QID) | INTRAMUSCULAR | Status: DC | PRN
  Administered 2022-04-12: 4 mg via INTRAVENOUS
  Filled 2022-04-12: qty 2

## 2022-04-12 MED ORDER — SODIUM CHLORIDE 0.9 % IV SOLN
500.0000 mg | Freq: Once | INTRAVENOUS | Status: AC
  Administered 2022-04-12: 500 mg via INTRAVENOUS
  Filled 2022-04-12 (×2): qty 5

## 2022-04-12 MED ORDER — PANTOPRAZOLE SODIUM 40 MG PO TBEC
40.0000 mg | DELAYED_RELEASE_TABLET | Freq: Every day | ORAL | Status: DC
  Administered 2022-04-13 – 2022-04-14 (×2): 40 mg via ORAL
  Filled 2022-04-12 (×2): qty 1

## 2022-04-12 MED ORDER — SODIUM CHLORIDE 0.9% FLUSH: 3.0000 mL | INTRAVENOUS | Status: DC | PRN

## 2022-04-12 MED ORDER — LACTATED RINGERS IV BOLUS
1000.0000 mL | Freq: Once | INTRAVENOUS | Status: AC
  Administered 2022-04-12: 1000 mL via INTRAVENOUS

## 2022-04-12 MED ORDER — ALBUTEROL SULFATE HFA 108 (90 BASE) MCG/ACT IN AERS
2.0000 | INHALATION_SPRAY | RESPIRATORY_TRACT | Status: DC | PRN
  Administered 2022-04-13 (×2): 2 via RESPIRATORY_TRACT
  Filled 2022-04-12: qty 6.7

## 2022-04-12 MED ORDER — TIOTROPIUM BROMIDE MONOHYDRATE 18 MCG IN CAPS
1.0000 | ORAL_CAPSULE | Freq: Every day | RESPIRATORY_TRACT | Status: DC
  Administered 2022-04-13 – 2022-04-14 (×2): 18 ug via RESPIRATORY_TRACT
  Filled 2022-04-12: qty 5

## 2022-04-12 MED ORDER — NIRMATRELVIR/RITONAVIR (PAXLOVID)TABLET
3.0000 | ORAL_TABLET | Freq: Two times a day (BID) | ORAL | Status: DC
  Administered 2022-04-12 – 2022-04-14 (×4): 3 via ORAL
  Filled 2022-04-12: qty 30

## 2022-04-12 MED ORDER — LEVOTHYROXINE SODIUM 75 MCG PO TABS
75.0000 ug | ORAL_TABLET | Freq: Every day | ORAL | Status: DC
  Administered 2022-04-13 – 2022-04-14 (×2): 75 ug via ORAL
  Filled 2022-04-12 (×2): qty 1

## 2022-04-12 MED ORDER — MOMETASONE FURO-FORMOTEROL FUM 200-5 MCG/ACT IN AERO
2.0000 | INHALATION_SPRAY | Freq: Two times a day (BID) | RESPIRATORY_TRACT | Status: DC
  Administered 2022-04-12 – 2022-04-14 (×4): 2 via RESPIRATORY_TRACT
  Filled 2022-04-12: qty 8.8

## 2022-04-12 MED ORDER — IPRATROPIUM-ALBUTEROL 0.5-2.5 (3) MG/3ML IN SOLN
9.0000 mL | Freq: Once | RESPIRATORY_TRACT | Status: AC
  Administered 2022-04-12: 9 mL via RESPIRATORY_TRACT
  Filled 2022-04-12: qty 3

## 2022-04-12 MED ORDER — SODIUM CHLORIDE 0.9 % IV SOLN
1.0000 g | Freq: Once | INTRAVENOUS | Status: AC
  Administered 2022-04-12: 1 g via INTRAVENOUS
  Filled 2022-04-12: qty 10

## 2022-04-12 MED ORDER — PREDNISONE 20 MG PO TABS
40.0000 mg | ORAL_TABLET | Freq: Every day | ORAL | Status: DC
  Administered 2022-04-14: 40 mg via ORAL
  Filled 2022-04-12: qty 2

## 2022-04-12 MED ORDER — SIMVASTATIN 10 MG PO TABS: 10.0000 mg | ORAL_TABLET | Freq: Every day | ORAL | Status: DC

## 2022-04-12 MED ORDER — IOHEXOL 350 MG/ML SOLN
75.0000 mL | Freq: Once | INTRAVENOUS | Status: AC | PRN
  Administered 2022-04-12: 75 mL via INTRAVENOUS

## 2022-04-12 MED ORDER — ACETAMINOPHEN 650 MG RE SUPP: 650.0000 mg | Freq: Four times a day (QID) | RECTAL | Status: DC | PRN

## 2022-04-12 MED ORDER — NITROGLYCERIN 0.4 MG SL SUBL: 0.4000 mg | SUBLINGUAL_TABLET | SUBLINGUAL | Status: DC | PRN

## 2022-04-12 MED ORDER — ENOXAPARIN SODIUM 40 MG/0.4ML IJ SOSY
40.0000 mg | PREFILLED_SYRINGE | INTRAMUSCULAR | Status: DC
  Administered 2022-04-12 – 2022-04-13 (×2): 40 mg via SUBCUTANEOUS
  Filled 2022-04-12 (×2): qty 0.4

## 2022-04-12 MED ORDER — METHYLPREDNISOLONE SODIUM SUCC 125 MG IJ SOLR
125.0000 mg | Freq: Once | INTRAMUSCULAR | Status: AC
  Administered 2022-04-12: 125 mg via INTRAVENOUS
  Filled 2022-04-12: qty 2

## 2022-04-12 MED ORDER — ISOSORBIDE MONONITRATE ER 30 MG PO TB24
15.0000 mg | ORAL_TABLET | Freq: Every day | ORAL | Status: DC
  Administered 2022-04-13 – 2022-04-14 (×2): 15 mg via ORAL
  Filled 2022-04-12 (×2): qty 1

## 2022-04-12 MED ORDER — SODIUM CHLORIDE 0.9 % IV SOLN: 250.0000 mL | INTRAVENOUS | Status: DC | PRN

## 2022-04-12 MED ORDER — IPRATROPIUM-ALBUTEROL 0.5-2.5 (3) MG/3ML IN SOLN
3.0000 mL | Freq: Once | RESPIRATORY_TRACT | Status: AC
  Administered 2022-04-12: 3 mL via RESPIRATORY_TRACT
  Filled 2022-04-12: qty 3

## 2022-04-12 MED ORDER — VITAMIN D 25 MCG (1000 UNIT) PO TABS
1000.0000 [IU] | ORAL_TABLET | Freq: Every day | ORAL | Status: DC
  Administered 2022-04-13 – 2022-04-14 (×2): 1000 [IU] via ORAL
  Filled 2022-04-12 (×2): qty 1

## 2022-04-12 MED ORDER — ONDANSETRON HCL 4 MG PO TABS: 4.0000 mg | ORAL_TABLET | Freq: Four times a day (QID) | ORAL | Status: DC | PRN

## 2022-04-12 MED ORDER — ACETAMINOPHEN 325 MG PO TABS
650.0000 mg | ORAL_TABLET | Freq: Four times a day (QID) | ORAL | Status: DC | PRN
  Administered 2022-04-13 (×2): 650 mg via ORAL
  Filled 2022-04-12 (×2): qty 2

## 2022-04-12 MED ORDER — GUAIFENESIN ER 600 MG PO TB12
600.0000 mg | ORAL_TABLET | Freq: Two times a day (BID) | ORAL | Status: DC
  Administered 2022-04-12 – 2022-04-14 (×4): 600 mg via ORAL
  Filled 2022-04-12 (×4): qty 1

## 2022-04-12 NOTE — Assessment & Plan Note (Signed)
Stable ?Follow-up with cardiology as an outpatient ?

## 2022-04-12 NOTE — Assessment & Plan Note (Addendum)
Likely triggered by covid infection ?--started on IV solumedrol and  ?Plan: ?--cont steroid ?--start DuoNeb scheduled ?--cont Dulera and Spiriva ?

## 2022-04-12 NOTE — H&P (Signed)
?History and Physical  ? ? ?Patient: Linda Brady [id number] DOB: [birth year] ?DOA: 04/12/2022 ?DOS: the patient was seen and examined on 04/12/2022 ?PCP: Maryland Pink, MD  ?Patient coming from: Home ? ?Chief Complaint:  ?Chief Complaint  ?Patient presents with  ? Shortness of Breath  ? ?HPI: Linda Brady is a 81 y.o. female with medical history significant for COPD, hypertension, thyroid disease who presents to the ER for evaluation of a 3-day history of worsening shortness of breath from her baseline associated with pleuritic chest pain and a cough productive of clear phlegm.  She denies having any fever, no chills, no sore throat, no nausea, no vomiting, no changes in her bowel habits. ?She complains some epigastric discomfort which she states is chronic and she thinks it is related to her reflux as well as abdominal pain mostly in the right upper quadrant which she has had since after her cholecystectomy. ?She denies having any palpitations, no diaphoresis, no dizziness, no lightheadedness, no leg swelling, no blurred vision or focal deficit. ?Patient is unvaccinated against the COVID-19 virus and denies having any sick contacts. ?Per EMS she had room air pulse oximetry of 94% and had diffuse wheezing.  She received 2 nebulizer treatments by EMS. ?Review of Systems: As mentioned in the history of present illness. All other systems reviewed and are negative. ?Past Medical History:  ?Diagnosis Date  ? COPD (chronic obstructive pulmonary disease) ([place])   ? Hypertension   ? Thyroid disease   ? ?Past Surgical History:  ?Procedure Laterality Date  ? BREAST CYST ASPIRATION Left 10 plus yrs  ? benign-twice  ? LEFT HEART CATH AND CORONARY ANGIOGRAPHY N/A 09/11/2020  ? Procedure: LEFT HEART CATH AND CORONARY ANGIOGRAPHY;  Surgeon: Isaias Cowman, MD;  Location: [facility];  Service: Cardiovascular;  Laterality: N/A;  ? ?Social History:  reports that she has quit smoking. Her smoking use  included cigarettes. She has a 6.00 pack-year smoking history. She has never used smokeless tobacco. She reports that she does not currently use alcohol. She reports that she does not currently use drugs. ? ?Allergies  ?Allergen Reactions  ? Naproxen Sodium Anaphylaxis  ? ? ?Family History  ?Problem Relation Age of Onset  ? Breast cancer Neg Hx   ? ? ?Prior to Admission medications   ?Medication Sig Start Date End Date Taking? Authorizing Provider  ?albuterol (VENTOLIN HFA) 108 (90 Base) MCG/ACT inhaler Inhale 2 puffs into the lungs every 4 (four) hours as needed for wheezing or shortness of breath. 12/16/21   Ezekiel Slocumb, DO  ?aspirin 81 MG EC tablet Take 81 mg by mouth daily.    [provider]  ?cholecalciferol (VITAMIN D3) 25 MCG (1000 UNIT) tablet Take 1,000 Units by mouth daily.    [provider]  ?enalapril (VASOTEC) 20 MG tablet Take 20 mg by mouth 2 (two) times daily. 04/13/20   [provider]  ?FLUoxetine (PROZAC) 20 MG capsule Take 20 mg by mouth daily. 03/14/20 02/28/22  [provider]  ?fluticasone-salmeterol (ADVAIR) 250-50 MCG/ACT AEPB Inhale 1 puff into the lungs in the morning and at bedtime. 06/20/21   [provider]  ?hydrOXYzine (ATARAX/VISTARIL) 25 MG tablet Take 25 mg by mouth 3 (three) times daily as needed for anxiety. 12/13/19   [provider]  ?isosorbide mononitrate (IMDUR) 30 MG 24 hr tablet Take 0.5 tablets (15 mg total) by mouth daily. 12/16/21   Ezekiel Slocumb, DO  ?levothyroxine (SYNTHROID) 75 MCG tablet Take 75  mcg by mouth daily. 05/02/20   [provider]  ?nitroGLYCERIN (NITROSTAT) 0.4 MG SL tablet Place 1 tablet (0.4 mg total) under the tongue every 5 (five) minutes as needed for chest pain. For a maximum of 3 doses. ?Patient not taking: Reported on 02/28/2022 12/24/21 03/24/22  Kate Sable, MD  ?pantoprazole (PROTONIX) 40 MG tablet Take 1 tablet (40 mg total) by mouth daily. 12/24/21   Kate Sable, MD   ?predniSONE (DELTASONE) 20 MG tablet Take 2 tablets (40 mg total) by mouth daily with breakfast. 03/02/22   Wouk, Ailene Rud, MD  ?simvastatin (ZOCOR) 20 MG tablet Take 10 mg by mouth daily. 04/02/21   [provider]  ?SPIRIVA HANDIHALER 18 MCG inhalation capsule Place 1 capsule into inhaler and inhale daily. 08/22/20   [provider]  ?vitamin B-12 (CYANOCOBALAMIN) 1000 MCG tablet Take 1,000 mcg by mouth daily.    [provider]  ? ? ?Physical Exam: ?Vitals:  ? 04/12/22 1200 04/12/22 1300 04/12/22 1400 04/12/22 1503  ?BP: (!) 171/78 (!) 162/68 (!) 147/74 (!) 156/71  ?Pulse: 88 92 (!) 111 (!) 102  ?Resp: (!) 23 19 13 17   ?Temp:    99 ?F (37.2 ?C)  ?TempSrc:    Oral  ?SpO2: 100% 96% 98% 98%  ?Weight:      ?Height:      ? ?Physical Exam ?Vitals and nursing note reviewed.  ?Constitutional:   ?   Comments: Acutely ill-appearing  ?HENT:  ?   Head: Normocephalic and atraumatic.  ?   Mouth/Throat:  ?   Mouth: Mucous membranes are moist.  ?Eyes:  ?   Pupils: Pupils are equal, round, and reactive to light.  ?Cardiovascular:  ?   Rate and Rhythm: Normal rate and regular rhythm.  ?Pulmonary:  ?   Effort: Tachypnea present.  ?   Breath sounds: Examination of the right-middle field reveals wheezing. Examination of the left-middle field reveals wheezing. Examination of the right-lower field reveals wheezing. Examination of the left-lower field reveals wheezing. Wheezing present.  ?Abdominal:  ?   General: Bowel sounds are normal.  ?   Palpations: Abdomen is soft.  ?Musculoskeletal:     ?   General: Normal range of motion.  ?   Cervical back: Normal range of motion and neck supple.  ?Skin: ?   General: Skin is warm and dry.  ?Neurological:  ?   General: No focal deficit present.  ?   Mental Status: She is alert.  ?Psychiatric:     ?   Mood and Affect: Mood normal.     ?   Behavior: Behavior normal.  ? ? ?Data Reviewed: ?Relevant notes from primary care and specialist visits, past discharge summaries as  available in EHR, including Care Everywhere. ?Prior diagnostic testing as pertinent to current admission diagnoses ?Updated medications and problem lists for reconciliation ?ED course, including vitals, labs, imaging, treatment and response to treatment ?Triage notes, nursing and pharmacy notes and ED provider's notes ?Notable results as noted in HPI ?Labs reviewed.  Troponin 14, lactic acid 2.3, procalcitonin less than 0.10, BNP 128, sodium 135, potassium 3.3, chloride 100, bicarb 25, glucose 112, BUN less than 5, creatinine 0.83, troponin 11, D-dimer 1.13, white count 3.6, hemoglobin 12.2, hematocrit 37.0, MCV 88.5, platelet count 344 ?Respiratory viral panel is positive for SARS coronavirus 2 ?Chest x-ray reviewed by me shows Trace left pleural effusion. Chronic changes of COPD. No focal ?airspace consolidation. ?CT angiogram of the chest shows no evidence of pulmonary artery  embolism. There is no evidence of thoracic aortic dissection. Coronary artery calcifications are seen. Calcifications are seen in the aortic ?annulus. 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 Small patchy infiltrates are seen in the right middle lobe, lingula and both lower lobes suggesting atelectasis/pneumonia. Minimal pleural effusions. ?Small hiatal hernia is seen. ?Twelve-lead EKG reviewed by me shows sinus rhythm with PVCs. ?There are no new results to review at this time. ? ?Assessment and Plan: ?* Pneumonia due to COVID-19 virus ?Patient presents for evaluation of worsening shortness of breath from her baseline associated with a cough productive of clear phlegm. ?Had COVID-19 PCR test is positive and her cycle threshold value is 31.1 ?Continue Paxlovid initiated in the ER ?Continue systemic steroids for underlying COPD exacerbation ?Supportive care with bronchodilator therapy, antitussives and vitamins ?Patient received a dose of Rocephin and Zithromax in the ER but there is no evidence to suggest an acute bacterial infection at  this time. ? ?COPD with acute exacerbation ([address]) ?Patient with a known history of COPD who presents to the ER for evaluation of worsening shortness of breath from her baseline associated with a cough produ

## 2022-04-12 NOTE — Assessment & Plan Note (Signed)
Blood pressure is stable ?Continue enalapril and nitrates ? ?

## 2022-04-12 NOTE — ED Provider Notes (Signed)
? ?[facility] ?Provider Note ? ? ? Event Date/Time  ? First MD Initiated Contact with Patient 04/12/22 1135   ?  (approximate) ? ? ?History  ? ?Shortness of Breath ? ? ?HPI ? ?Linda Brady is a 81 y.o. female  with medical history significant for COPD with remote tobacco abuse, hypertension and thyroid disease who presents via EMS from home for evaluation of 2 to 3 days of worsening shortness of breath, chest pain and productive cough.  No hemoptysis.  No fevers.  No headache, earache, sore throat, nausea, vomiting or diarrhea.  No urinary symptoms.  No rash pain.  No extremity pain.  No recent falls or injuries.  She is not on any blood thinners.  She endorses some chronic epigastric discomfort which has been ongoing for at least several weeks that is 90 different today and she states has been on and off since she had her gallbladder removed.  Received some DuoNebs with EMS which seem to help a little bit. ? ?  ? ? ?Physical Exam  ?Triage Vital Signs: ?ED Triage Vitals  ?Enc Vitals Group  ?   BP   ?   Pulse   ?   Resp   ?   Temp   ?   Temp src   ?   SpO2   ?   Weight   ?   Height   ?   Head Circumference   ?   Peak Flow   ?   Pain Score   ?   Pain Loc   ?   Pain Edu?   ?   Excl. in GC?   ? ? ?Most recent vital signs: ?Vitals:  ? 04/12/22 1300 04/12/22 1400  ?BP: (!) 162/68 (!) 147/74  ?Pulse: 92 (!) 111  ?Resp: 19 13  ?Temp:    ?SpO2: 96% 98%  ? ? ?General: Awake, comfortable appearing. ?CV:  Good peripheral perfusion.  Plus radial pulses. ?Resp:  Normal effort.  Bilateral wheezing present with tachypnea and slight increase use of accessory muscles. ?Abd:  No distention.  Soft throughout. ?Other:   ? ?No significant lower extremity edema. ? ? ?ED Results / Procedures / Treatments  ?Labs ?(all labs ordered are listed, but only abnormal results are displayed) ?Labs Reviewed  ?RESP PANEL BY RT-PCR (FLU A&B, COVID) ARPGX2 - Abnormal; Notable for the following components:  ?    Result Value   ? SARS Coronavirus 2 by RT PCR POSITIVE (*)   ? All other components within normal limits  ?CBC WITH DIFFERENTIAL/PLATELET - Abnormal; Notable for the following components:  ? WBC 3.6 (*)   ? All other components within normal limits  ?BASIC METABOLIC PANEL - Abnormal; Notable for the following components:  ? Potassium 3.3 (*)   ? Glucose, Bld 112 (*)   ? BUN <5 (*)   ? All other components within normal limits  ?BRAIN NATRIURETIC PEPTIDE - Abnormal; Notable for the following components:  ? B Natriuretic Peptide 128.4 (*)   ? All other components within normal limits  ?D-DIMER, QUANTITATIVE - Abnormal; Notable for the following components:  ? D-Dimer, Quant 1.13 (*)   ? All other components within normal limits  ?CULTURE, BLOOD (ROUTINE X 2)  ?CULTURE, BLOOD (ROUTINE X 2)  ?PROCALCITONIN  ?LACTIC ACID, PLASMA  ?LACTIC ACID, PLASMA  ?TROPONIN I (HIGH SENSITIVITY)  ?TROPONIN I (HIGH SENSITIVITY)  ? ? ? ?EKG ? ?Sinus rhythm with a ventricular rate of 97 and significant  artifact present ST changes throughout.  Otherwise normal intervals. ? ? ?RADIOLOGY ? ?Chest x-ray on my potation shows a trace left lower follow-up with thorax overt edema or other acute process.  I also reviewed radiologist interpretation. ? ?CTA chest my interpretation shows no evidence of large PE but there is some patchy infiltrates more on the right than the left concerning for pneumonia.  There is a minimal pleural effusion.  As reviewed radiology interpretation and agree with the findings of evidence of CAD, calcification of the aorta, some ectasia of the ascending aorta and small hiatal hernia. ? ? ?PROCEDURES: ? ?Critical Care performed: No ? ?.1-3 Lead EKG Interpretation ?Performed by: Gilles ChiquitoSmith, Valma Rotenberg P, MD ?Authorized by: Gilles ChiquitoSmith, Yerlin Gasparyan P, MD  ? ?  Interpretation: non-specific   ?  ECG rate assessment: tachycardic   ?  Rhythm: sinus rhythm   ?  Ectopy: none   ?  Conduction: normal   ? ?The patient is on the cardiac monitor to evaluate for  evidence of arrhythmia and/or significant heart rate changes. ? ? ?MEDICATIONS ORDERED IN ED: ?Medications  ?ipratropium-albuterol (DUONEB) 0.5-2.5 (3) MG/3ML nebulizer solution 3 mL (has no administration in time range)  ?cefTRIAXone (ROCEPHIN) 1 g in sodium chloride 0.9 % 100 mL IVPB (has no administration in time range)  ?azithromycin (ZITHROMAX) 500 mg in sodium chloride 0.9 % 250 mL IVPB (has no administration in time range)  ?ipratropium-albuterol (DUONEB) 0.5-2.5 (3) MG/3ML nebulizer solution 9 mL (9 mLs Nebulization Given 04/12/22 1216)  ?methylPREDNISolone sodium succinate (SOLU-MEDROL) 125 mg/2 mL injection 125 mg (125 mg Intravenous Given 04/12/22 1216)  ?LORazepam (ATIVAN) injection 0.5 mg (0.5 mg Intravenous Given 04/12/22 1302)  ?potassium chloride SA (KLOR-CON M) CR tablet 40 mEq (40 mEq Oral Given 04/12/22 1304)  ?iohexol (OMNIPAQUE) 350 MG/ML injection 75 mL (75 mLs Intravenous Contrast Given 04/12/22 1349)  ? ? ? ?IMPRESSION / MDM / ASSESSMENT AND PLAN / ED COURSE  ?I reviewed the triage vital signs and the nursing notes. ?             ?               ? ?Differential diagnosis includes, but is not limited to COPD exacerbation, CHF, ACS, PE, pneumonia, pneumothorax, bronchitis. ? ?Sinus rhythm with a ventricular rate of 97 and significant artifact present ST changes throughout.  Otherwise normal intervals.  Nonelevated troponin obtained greater than 3 hours after symptom onset is not suggestive of ACS. ? ?Chest x-ray on my potation shows a trace left lower follow-up with thorax overt edema or other acute process.  I also reviewed radiologist interpretation. ? ?CBC count of 3.6 without evidence of acute anemia and normal platelets.  BMP was obtained of 3.3 without any other significant derangements.  BNP is 128.  Dimer is elevated 1.13 consulted a CTA to rule out a PE. ? ?CTA chest my interpretation shows no evidence of large PE but there is some patchy infiltrates more on the right than the left  concerning for pneumonia.  There is a minimal pleural effusion.  As reviewed radiology interpretation and agree with the findings of evidence of CAD, calcification of the aorta, some ectasia of the ascending aorta and small hiatal hernia. ? ?COVID PCR did return positive.  Influenza is negative.  I suspect this is likely the primary cause of her COPD exacerbation.  She does have tachycardia up to the 130s and tachypnea up to the low 40s on my reassessment trial of ambulation.  She  is not hypoxic.  We will give a dose of Rocephin and azithromycin as well.  Increased sputum production COPD history.  I do not believe she is septic at this time although abdomen abundance of caution will obtain blood cultures and lactic acid.  She is still wheezing on my reassessment until ability to order another DuoNeb.  Given ongoing increased work of breathing and tachycardia tachypnea will admit to medicine service for further evaluation and management. ? ?  ? ? ?FINAL CLINICAL IMPRESSION(S) / ED DIAGNOSES  ? ?Final diagnoses:  ?SOB (shortness of breath)  ?Chest pain, unspecified type  ?Positive D dimer  ?COVID  ?Hiatal hernia  ?COPD exacerbation (HCC)  ? ? ? ?Rx / DC Orders  ? ?ED Discharge Orders   ? ? None  ? ?  ? ? ? ?Note:  This document was prepared using Dragon voice recognition software and may include unintentional dictation errors. ?  ?Gilles Chiquito, MD ?04/12/22 1441 ? ?

## 2022-04-12 NOTE — ED Notes (Signed)
Informed RN bed assigned 

## 2022-04-12 NOTE — Assessment & Plan Note (Signed)
Stable ?Patient noted to have a small hiatal hernia on imaging ?Continue PPI ?

## 2022-04-12 NOTE — Assessment & Plan Note (Signed)
Stable Continue Synthroid 

## 2022-04-12 NOTE — Assessment & Plan Note (Addendum)
Treated and resolved 

## 2022-04-12 NOTE — Assessment & Plan Note (Addendum)
Patient presents for evaluation of worsening shortness of breath from her baseline associated with a cough productive of clear phlegm. ?Had COVID-19 PCR test is positive and her cycle threshold value is 31.1 ?Patient received a dose of Rocephin and Zithromax in the ER but there is no evidence to suggest an acute bacterial infection at this time, will not continue abx. ?Plan: ?Continue Paxlovid initiated in the ER ?Continue systemic steroids for underlying COPD exacerbation ?Supportive care with bronchodilator therapy, antitussives and vitamins ? ?

## 2022-04-12 NOTE — ED Triage Notes (Signed)
BIB [facility] from home. Called for SOB. HX of COPD. Room air was 94%. Wheezing in all lobes. 2 neb given by EMS. Pt is having abdomen pain as well.  EKG was obtained.  ? ?201/100 ?88 HR  ?109 BGL ? ?

## 2022-04-12 NOTE — Assessment & Plan Note (Addendum)
No evidence of sepsis at this time and lactic acidosis may be related to underlying COPD exacerbation. ?--resolved with IVF ?

## 2022-04-13 DIAGNOSIS — J1282 Pneumonia due to coronavirus disease 2019: Secondary | ICD-10-CM | POA: Diagnosis not present

## 2022-04-13 DIAGNOSIS — U071 COVID-19: Secondary | ICD-10-CM | POA: Diagnosis not present

## 2022-04-13 LAB — CBC
HCT: 35.7 % — ABNORMAL LOW (ref 36.0–46.0)
Hemoglobin: 11.8 g/dL — ABNORMAL LOW (ref 12.0–15.0)
MCH: 29.5 pg (ref 26.0–34.0)
MCHC: 33.1 g/dL (ref 30.0–36.0)
MCV: 89.3 fL (ref 80.0–100.0)
Platelets: 317 10*3/uL (ref 150–400)
RBC: 4 MIL/uL (ref 3.87–5.11)
RDW: 13.3 % (ref 11.5–15.5)
WBC: 2.9 10*3/uL — ABNORMAL LOW (ref 4.0–10.5)
nRBC: 0 % (ref 0.0–0.2)

## 2022-04-13 LAB — BASIC METABOLIC PANEL
Anion gap: 7 (ref 5–15)
BUN: 8 mg/dL (ref 8–23)
CO2: 25 mmol/L (ref 22–32)
Calcium: 9.2 mg/dL (ref 8.9–10.3)
Chloride: 103 mmol/L (ref 98–111)
Creatinine, Ser: 0.77 mg/dL (ref 0.44–1.00)
GFR, Estimated: >60 mL/min (ref >60)
Glucose, Bld: 148 mg/dL — ABNORMAL HIGH (ref 70–99)
Potassium: 4.2 mmol/L (ref 3.5–5.1)
Sodium: 135 mmol/L (ref 135–145)

## 2022-04-13 LAB — LACTIC ACID, PLASMA: Lactic Acid, Venous: 1.1 mmol/L (ref 0.5–1.9)

## 2022-04-13 MED ORDER — ALUM & MAG HYDROXIDE-SIMETH 200-200-20 MG/5ML PO SUSP
30.0000 mL | ORAL | Status: DC | PRN
  Administered 2022-04-13 (×3): 30 mL via ORAL
  Filled 2022-04-13 (×3): qty 30

## 2022-04-13 MED ORDER — IPRATROPIUM-ALBUTEROL 20-100 MCG/ACT IN AERS
1.0000 | INHALATION_SPRAY | Freq: Four times a day (QID) | RESPIRATORY_TRACT | Status: DC
  Administered 2022-04-13 – 2022-04-14 (×2): 1 via RESPIRATORY_TRACT
  Filled 2022-04-13: qty 4

## 2022-04-13 MED ORDER — LACTATED RINGERS IV BOLUS
1000.0000 mL | Freq: Once | INTRAVENOUS | Status: AC
  Administered 2022-04-13: 1000 mL via INTRAVENOUS

## 2022-04-13 MED ORDER — IPRATROPIUM-ALBUTEROL 0.5-2.5 (3) MG/3ML IN SOLN: 3.0000 mL | Freq: Four times a day (QID) | RESPIRATORY_TRACT | Status: DC

## 2022-04-13 NOTE — Progress Notes (Signed)
Pt's lactic acid value is 4.3. No acute distress noted. Notified on call NP. ?

## 2022-04-13 NOTE — Progress Notes (Signed)
?  Progress Note ? ? ?Patient: Linda Brady TDD:[id number] DOB: July 01, 1941 DOA: 04/12/2022     1 ?DOS: the patient was seen and examined on 04/13/2022 ?  ?Brief hospital course: ?No notes on file ? ?Assessment and Plan: ?* Pneumonia due to COVID-19 virus ?Patient presents for evaluation of worsening shortness of breath from her baseline associated with a cough productive of clear phlegm. ?Had COVID-19 PCR test is positive and her cycle threshold value is 31.1 ?Patient received a dose of Rocephin and Zithromax in the ER but there is no evidence to suggest an acute bacterial infection at this time, will not continue abx. ?Plan: ?Continue Paxlovid initiated in the ER ?Continue systemic steroids for underlying COPD exacerbation ?Supportive care with bronchodilator therapy, antitussives and vitamins ? ? ?COPD with acute exacerbation (HCC) ?Likely triggered by covid infection ?--started on IV solumedrol and  ?Plan: ?--cont steroid ?--start DuoNeb scheduled ?--cont Dulera and Spiriva ? ?Essential hypertension ?Blood pressure is stable ?Continue enalapril and nitrates ? ? ?GERD (gastroesophageal reflux disease) ?Stable ?Patient noted to have a small hiatal hernia on imaging ?Continue PPI ? ?Hypokalemia ?Supplement potassium ? ? ?Hypothyroidism ?Stable ?Continue Synthroid ? ?Lactic acidosis ?No evidence of sepsis at this time and lactic acidosis may be related to underlying COPD exacerbation. ?--resolved with IVF ? ?Aortic stenosis, moderate ?Stable ?Follow-up with cardiology as an outpatient ? ? ? ? ?  ? ?Subjective:  ?Pt reported breathing improved, but not back to baseline.  No need for supplemental oxygen. ? ? ?Physical Exam: ? ?Constitutional: NAD, AAOx3 ?HEENT: conjunctivae and lids normal, EOMI ?CV: No cyanosis.   ?RESP: reduced air movement, high-pitched expiratory wheezing, on RA ?Extremities: No effusions, edema in BLE ?SKIN: warm, dry ?Neuro: II - XII grossly intact.   ?Psych: Normal mood and affect.  Appropriate  judgement and reason ? ? ?Data Reviewed: ? ?Family Communication:  ? ?Disposition: ?Status is: Inpatient ? ? Planned Discharge Destination: Home ? ? ? ?Time spent: 50 minutes ? ?Author: ?Darlin Priestly, MD ?04/13/2022 5:17 PM ? ?For on call review [email or web address].  ?

## 2022-04-13 NOTE — Progress Notes (Signed)
Cross Cover ?Worsening lactic acidosis. No signs of hypoperfusion ?1 liter of LR ordered for infusion. Recheck lactic at 0600 ?

## 2022-04-14 DIAGNOSIS — J1282 Pneumonia due to coronavirus disease 2019: Secondary | ICD-10-CM | POA: Diagnosis not present

## 2022-04-14 DIAGNOSIS — U071 COVID-19: Secondary | ICD-10-CM | POA: Diagnosis not present

## 2022-04-14 LAB — CBC
HCT: 33.9 % — ABNORMAL LOW (ref 36.0–46.0)
Hemoglobin: 11.3 g/dL — ABNORMAL LOW (ref 12.0–15.0)
MCH: 29.6 pg (ref 26.0–34.0)
MCHC: 33.3 g/dL (ref 30.0–36.0)
MCV: 88.7 fL (ref 80.0–100.0)
Platelets: 335 10*3/uL (ref 150–400)
RBC: 3.82 MIL/uL — ABNORMAL LOW (ref 3.87–5.11)
RDW: 13.6 % (ref 11.5–15.5)
WBC: 9.2 10*3/uL (ref 4.0–10.5)
nRBC: 0 % (ref 0.0–0.2)

## 2022-04-14 LAB — BASIC METABOLIC PANEL
Anion gap: 11 (ref 5–15)
BUN: 13 mg/dL (ref 8–23)
CO2: 26 mmol/L (ref 22–32)
Calcium: 9.3 mg/dL (ref 8.9–10.3)
Chloride: 95 mmol/L — ABNORMAL LOW (ref 98–111)
Creatinine, Ser: 0.92 mg/dL (ref 0.44–1.00)
GFR, Estimated: >60 mL/min (ref >60)
Glucose, Bld: 144 mg/dL — ABNORMAL HIGH (ref 70–99)
Potassium: 4.3 mmol/L (ref 3.5–5.1)
Sodium: 132 mmol/L — ABNORMAL LOW (ref 135–145)

## 2022-04-14 LAB — MAGNESIUM: Magnesium: 2.2 mg/dL (ref 1.7–2.4)

## 2022-04-14 MED ORDER — NIRMATRELVIR/RITONAVIR (PAXLOVID)TABLET
ORAL_TABLET | ORAL | 0 refills | Status: DC
Start: 2022-04-14 — End: 2022-09-10

## 2022-04-14 MED ORDER — ASCORBIC ACID 500 MG PO TABS: 500.0000 mg | ORAL_TABLET | Freq: Every day | ORAL | 0 refills | Status: AC

## 2022-04-14 MED ORDER — ZINC SULFATE 220 (50 ZN) MG PO CAPS: 220.0000 mg | ORAL_CAPSULE | Freq: Every day | ORAL | 0 refills | Status: AC

## 2022-04-14 MED ORDER — PREDNISONE 20 MG PO TABS
40.0000 mg | ORAL_TABLET | Freq: Every day | ORAL | 0 refills | Status: AC
Start: 2022-04-15 — End: 2022-04-17

## 2022-04-14 MED ORDER — IPRATROPIUM-ALBUTEROL 20-100 MCG/ACT IN AERS: 1.0000 | INHALATION_SPRAY | Freq: Four times a day (QID) | RESPIRATORY_TRACT | 0 refills | Status: DC

## 2022-04-14 NOTE — Discharge Summary (Signed)
Physician Discharge Summary   Linda Brady  female DOB: 1941/05/27  TSV:[id number]  PCP: [name], MD  Admit date: 04/12/2022 Discharge date: 04/14/2022  Admitted From: home Disposition:  home CODE STATUS: Full code  Discharge Instructions     Discharge instructions   Complete by: As directed    You have mild COVID infection that triggered a mild COPD flare up, but you do not need supplemental oxygen and are stable and improving, so you can go home to recover.  Please finish Paxlovid as directed on the box, and take 2 more days of prednisone 40 mg daily starting 5/15.  You can take the hospital Combivent inhaler home with you to use it 4 times a day to help with shortness of breath.    Your blood pressure has been high, please follow up with your PCP for further management.   Dr. Darlin Priestly [facility] Course:  For full details, please see H&P, progress notes, consult notes and ancillary notes.  Briefly,  Linda Brady is a 81 y.o. female with medical history significant for COPD, hypertension, thyroid disease who presents to the ER for evaluation of a 3-day history of worsening shortness of breath from her baseline associated with pleuritic chest pain and a cough productive of clear phlegm.    * Pneumonia due to COVID-19 virus Had COVID-19 PCR test is positive and her cycle threshold value is 31.1. --No oxygen requirement.  CXR no acute finding. --pt started on Paxlovid and will finish the course at home. --Patient received a dose of Rocephin and Zithromax in the ER but there is no evidence to suggest an acute bacterial infection at this time, so abx were not continued.   COPD with acute exacerbation (HCC) --Pt was started on IV solumedrol and transitioned to prednisone 40 mg daily.  Will finish 2 more days at home.  Pt was ordered Combivent QID to be used for the next month as well as home Adviar and Spiriva.     Essential hypertension Blood  pressure elevated. Pt was not taking enalapril PTA --cont home Imdur --further management per PCP  GERD (gastroesophageal reflux disease) Stable Patient noted to have a small hiatal hernia on imaging Continue PPI   Hypokalemia Supplemented potassium   Hypothyroidism Stable Continue Synthroid   Lactic acidosis No evidence of sepsis at this time.  Unknown cause.  Resolved with IVF.   Aortic stenosis, moderate Stable Follow-up with cardiology as an outpatient   Discharge Diagnoses:  Principal Problem:   Pneumonia due to COVID-19 virus Active Problems:   COPD with acute exacerbation (HCC)   Essential hypertension   GERD (gastroesophageal reflux disease)   Hypokalemia   Hypothyroidism   Lactic acidosis   Aortic stenosis, moderate   30 Day Unplanned Readmission Risk Score    Flowsheet Row ED to Hosp-Admission (Current) from 04/12/2022 in [facility] 1C MEDICAL TELEMETRY  30 Day Unplanned Readmission Risk Score (%) 20.27 Filed at 04/14/2022 0801       This score is the patient's risk of an unplanned readmission within 30 days of being discharged (0 -100%). The score is based on dignosis, age, lab data, medications, orders, and past utilization.   Low:  0-14.9   Medium: 15-21.9   High: 22-29.9   Extreme: 30 and above         Discharge Instructions:  Allergies as of 04/14/2022       Reactions   Naproxen  Sodium Anaphylaxis        Medication List     STOP taking these medications    enalapril 20 MG tablet Commonly known as: VASOTEC   nitroGLYCERIN 0.4 MG SL tablet Commonly known as: NITROSTAT       TAKE these medications    albuterol 108 (90 Base) MCG/ACT inhaler Commonly known as: VENTOLIN HFA Inhale 2 puffs into the lungs every 4 (four) hours as needed for wheezing or shortness of breath.   ascorbic acid 500 MG tablet Commonly known as: VITAMIN C Take 1 tablet (500 mg total) by mouth daily.   aspirin 81 MG EC  tablet Take 81 mg by mouth daily.   cholecalciferol 25 MCG (1000 UNIT) tablet Commonly known as: VITAMIN D3 Take 1,000 Units by mouth daily.   FLUoxetine 20 MG capsule Commonly known as: PROZAC Take 20 mg by mouth daily.   fluticasone-salmeterol 250-50 MCG/ACT Aepb Commonly known as: ADVAIR Inhale 1 puff into the lungs in the morning and at bedtime.   hydrOXYzine 25 MG tablet Commonly known as: ATARAX Take 25 mg by mouth 3 (three) times daily as needed for anxiety.   Ipratropium-Albuterol 20-100 MCG/ACT Aers respimat Commonly known as: COMBIVENT Inhale 1 puff into the lungs 4 (four) times daily.   isosorbide mononitrate 30 MG 24 hr tablet Commonly known as: IMDUR Take 0.5 tablets (15 mg total) by mouth daily.   levothyroxine 75 MCG tablet Commonly known as: SYNTHROID Take 75 mcg by mouth daily.   nirmatrelvir/ritonavir EUA 20 x 150 MG & 10 x  Tabs Commonly known as: PAXLOVID Please finish taking according to instruction on the pill box.   pantoprazole 40 MG tablet Commonly known as: PROTONIX Take 1 tablet (40 mg total) by mouth daily.   predniSONE 20 MG tablet Commonly known as: DELTASONE Take 2 tablets (40 mg total) by mouth daily with breakfast for 2 days. Start taking on: Apr 15, 2022   simvastatin 20 MG tablet Commonly known as: ZOCOR Take 10 mg by mouth daily.   Spiriva HandiHaler 18 MCG inhalation capsule Generic drug: tiotropium Place 1 capsule into inhaler and inhale daily.   vitamin B-12 1000 MCG tablet Commonly known as: CYANOCOBALAMIN Take 1,000 mcg by mouth daily.   zinc sulfate 220 (50 Zn) MG capsule Take 1 capsule (220 mg total) by mouth daily.         Follow-up Information     [name], MD Follow up in 1 week(s).   Specialty: Family Medicine Contact information: [address] [facility] [place] [address] [phone]                 Allergies  Allergen Reactions   Naproxen Sodium Anaphylaxis      The results of significant diagnostics from this hospitalization (including imaging, microbiology, ancillary and laboratory) are listed below for reference.   Consultations:   Procedures/Studies: DG Chest 2 View  Result Date: 04/12/2022 CLINICAL DATA:  Shortness of breath history of COPD. EXAM: CHEST - 2 VIEW COMPARISON:  Radiograph February 28, 2022 FINDINGS: The heart size and mediastinal contours are within normal limits. No focal airspace consolidation. Pulmonary hyperexpansion with chronic bronchitic lung changes. Trace left pleural effusion. No visible pneumothorax. No acute osseous abnormality. IMPRESSION: Trace left pleural effusion. Chronic changes of COPD. No focal airspace consolidation. Electronically Signed   By: Maudry Mayhew M.D.   On: 04/12/2022 12:04   CT Angio Chest PE W and/or Wo Contrast  Result Date: 04/12/2022 CLINICAL  DATA:  Shortness of breath, high clinical suspicion for PE EXAM: CT ANGIOGRAPHY CHEST WITH CONTRAST TECHNIQUE: Multidetector CT imaging of the chest was performed using the standard protocol during bolus administration of intravenous contrast. Multiplanar CT image reconstructions and MIPs were obtained to evaluate the vascular anatomy. RADIATION DOSE REDUCTION: This exam was performed according to the departmental dose-optimization program which includes automated exposure control, adjustment of the mA and/or kV according to patient size and/or use of iterative reconstruction technique. CONTRAST:  26mL OMNIPAQUE IOHEXOL 350 MG/ML SOLN COMPARISON:  CT done on 09/10/2020, chest radiograph done today FINDINGS: Cardiovascular: There is homogeneous enhancement in thoracic aorta. There is ectasia of ascending thoracic aorta measuring 3.6 cm coronary artery calcifications are seen. Calcification is seen in the aortic annulus. There are no intraluminal filling defects in the pulmonary artery branches. Mediastinum/Nodes: No significant lymphadenopathy seen. Lungs/Pleura:  There is narrowing of AP diameter of trachea and mainstem bronchi suggesting possible bronchospasm or tracheomalacia. Small patchy infiltrates are seen in the right middle lobe, lingula and both lower lobes suggesting atelectasis/pneumonia. Part of this finding may suggest underlying scarring. There is minimal pleural effusions, more so on the left side. There is no pneumothorax. Upper Abdomen: Surgical clips are seen in gallbladder fossa. Small hiatal hernia is seen. Musculoskeletal: Unremarkable. Review of the MIP images confirms the above findings. IMPRESSION: There is no evidence of pulmonary artery embolism. There is no evidence of thoracic aortic dissection. Coronary artery calcifications are seen. Calcifications are seen in the aortic annulus. 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 Small patchy infiltrates are seen in the right middle lobe, lingula and both lower lobes suggesting atelectasis/pneumonia. Minimal pleural effusions. Small hiatal hernia is seen. Electronically Signed   By: Ernie Avena M.D.   On: 04/12/2022 14:15      Labs: BNP (last 3 results) Recent Labs    04/12/22 1141  BNP 128.4*   Basic Metabolic Panel: Recent Labs  Lab 04/12/22 1141 04/12/22 1629 04/13/22 0422 04/14/22 0556  NA 135  --  135 132*  K 3.3*  --  4.2 4.3  CL 100  --  103 95*  CO2 25  --  25 26  GLUCOSE 112*  --  148* 144*  BUN <5*  --  8 13  CREATININE 0.83  --  0.77 0.92  CALCIUM 9.4  --  9.2 9.3  MG  --  1.9  --  2.2   Liver Function Tests: No results for input(s): AST, ALT, ALKPHOS, BILITOT, PROT, ALBUMIN in the last 168 hours. No results for input(s): LIPASE, AMYLASE in the last 168 hours. No results for input(s): AMMONIA in the last 168 hours. CBC: Recent Labs  Lab 04/12/22 1141 04/13/22 0422 04/14/22 0556  WBC 3.6* 2.9* 9.2  NEUTROABS 1.9  --   --   HGB 12.2 11.8* 11.3*  HCT 37.0 35.7* 33.9*  MCV 88.5 89.3 88.7  PLT 344 317 335   Cardiac Enzymes: No results for  input(s): CKTOTAL, CKMB, CKMBINDEX, TROPONINI in the last 168 hours. BNP: Invalid input(s): POCBNP CBG: No results for input(s): GLUCAP in the last 168 hours. D-Dimer Recent Labs    04/12/22 1141  DDIMER 1.13*   Hgb A1c No results for input(s): HGBA1C in the last 72 hours. Lipid Profile No results for input(s): CHOL, HDL, LDLCALC, TRIG, CHOLHDL, LDLDIRECT in the last 72 hours. Thyroid function studies No results for input(s): TSH, T4TOTAL, T3FREE, THYROIDAB in the last 72 hours.  Invalid input(s): FREET3 Anemia work up  No results for input(s): VITAMINB12, FOLATE, FERRITIN, TIBC, IRON, RETICCTPCT in the last 72 hours. Urinalysis    Component Value Date/Time   COLORURINE YELLOW (A) 12/14/2021 0656   APPEARANCEUR CLEAR (A) 12/14/2021 0656   APPEARANCEUR Hazy 11/12/2012 1355   LABSPEC 1.008 12/14/2021 0656   LABSPEC 1.005 11/12/2012 1355   PHURINE 7.0 12/14/2021 0656   GLUCOSEU NEGATIVE 12/14/2021 0656   GLUCOSEU Negative 11/12/2012 1355   HGBUR SMALL (A) 12/14/2021 0656   BILIRUBINUR NEGATIVE 12/14/2021 0656   BILIRUBINUR Negative 11/12/2012 1355   KETONESUR NEGATIVE 12/14/2021 0656   PROTEINUR NEGATIVE 12/14/2021 0656   NITRITE NEGATIVE 12/14/2021 0656   LEUKOCYTESUR NEGATIVE 12/14/2021 0656   LEUKOCYTESUR Trace 11/12/2012 1355   Sepsis Labs Invalid input(s): PROCALCITONIN,  WBC,  LACTICIDVEN Microbiology Recent Results (from the past 240 hour(s))  Resp Panel by RT-PCR (Flu A&B, Covid) Nasopharyngeal Swab     Status: Abnormal   Collection Time: 04/12/22 12:18 PM   Specimen: Nasopharyngeal Swab; Nasopharyngeal(NP) swabs in vial transport medium  Result Value Ref Range Status   SARS Coronavirus 2 by RT PCR POSITIVE (A) NEGATIVE Final    Comment: (NOTE) SARS-CoV-2 target nucleic acids are DETECTED.  The SARS-CoV-2 RNA is generally detectable in upper respiratory specimens during the acute phase of infection. Positive results are indicative of the presence of the  identified virus, but do not rule out bacterial infection or co-infection with other pathogens not detected by the test. Clinical correlation with patient history and other diagnostic information is necessary to determine patient infection status. The expected result is Negative.  Fact Sheet for Patients: BloggerCourse.comhttps://www.fda.gov/media/152166/download  Fact Sheet for Healthcare Providers: SeriousBroker.ithttps://www.fda.gov/media/152162/download  This test is not yet approved or cleared by the [place] FDA and  has been authorized for detection and/or diagnosis of SARS-CoV-2 by FDA under an Emergency Use Authorization (EUA).  This EUA will remain in effect (meaning this test can be used) for the duration of  the COVID-19 declaration under Section 564(b)(1) of the A ct, 21 U.S.C. section 360bbb-3(b)(1), unless the authorization is terminated or revoked sooner.     Influenza A by PCR NEGATIVE NEGATIVE Final   Influenza B by PCR NEGATIVE NEGATIVE Final    Comment: (NOTE) The Xpert Xpress SARS-CoV-2/FLU/RSV plus assay is intended as an aid in the diagnosis of influenza from Nasopharyngeal swab specimens and should not be used as a sole basis for treatment. Nasal washings and aspirates are unacceptable for Xpert Xpress SARS-CoV-2/FLU/RSV testing.  Fact Sheet for Patients: BloggerCourse.comhttps://www.fda.gov/media/152166/download  Fact Sheet for Healthcare Providers: SeriousBroker.ithttps://www.fda.gov/media/152162/download  This test is not yet approved or cleared by the [place] FDA and has been authorized for detection and/or diagnosis of SARS-CoV-2 by FDA under an Emergency Use Authorization (EUA). This EUA will remain in effect (meaning this test can be used) for the duration of the COVID-19 declaration under Section 564(b)(1) of the Act, 21 U.S.C. section 360bbb-3(b)(1), unless the authorization is terminated or revoked.  Performed at [facility], [address] [place], [place] [id number]   Blood  culture (routine x 2)     Status: None (Preliminary result)   Collection Time: 04/12/22  2:49 PM   Specimen: BLOOD  Result Value Ref Range Status   Specimen Description BLOOD RIGHT ANTECUBITAL  Final   Special Requests   Final    BOTTLES DRAWN AEROBIC AND ANAEROBIC Blood Culture adequate volume   Culture   Final    NO GROWTH 2 DAYS Performed at [facility], [address] [place], [place] [address]    Report Status PENDING  Incomplete  Blood culture (routine x 2)     Status: None (Preliminary result)   Collection Time: 04/12/22  3:04 PM   Specimen: BLOOD  Result Value Ref Range Status   Specimen Description BLOOD LEFT ANTECUBITAL  Final   Special Requests   Final    BOTTLES DRAWN AEROBIC AND ANAEROBIC Blood Culture results may not be optimal due to an inadequate volume of blood received in culture bottles   Culture   Final    NO GROWTH 2 DAYS Performed at [facility], [address] [place], [place] [address]    Report Status PENDING  Incomplete     Total time spend on discharging this patient, including the last patient exam, discussing the hospital stay, instructions for ongoing care as it relates to all pertinent caregivers, as well as preparing the medical discharge records, prescriptions, and/or referrals as applicable, is 35 minutes.    Darlin Priestly, MD  [facility] 04/14/2022, 8:20 AM

## 2022-04-17 LAB — CULTURE, BLOOD (ROUTINE X 2)
Culture: NO GROWTH
Culture: NO GROWTH
Special Requests: ADEQUATE

## 2022-05-16 ENCOUNTER — Other Ambulatory Visit: Payer: Self-pay

## 2022-05-16 ENCOUNTER — Emergency Department
Admission: EM | Admit: 2022-05-16 | Discharge: 2022-05-16 | Disposition: A | Discharge status: Home / Self Care | Payer: Medicare Other | Attending: Emergency Medicine | Admitting: Emergency Medicine

## 2022-05-16 ENCOUNTER — Emergency Department: Payer: Medicare Other

## 2022-05-16 DIAGNOSIS — I10 Essential (primary) hypertension: Secondary | ICD-10-CM | POA: Insufficient documentation

## 2022-05-16 DIAGNOSIS — R778 Other specified abnormalities of plasma proteins: Secondary | ICD-10-CM | POA: Insufficient documentation

## 2022-05-16 DIAGNOSIS — R079 Chest pain, unspecified: Secondary | ICD-10-CM

## 2022-05-16 DIAGNOSIS — R0789 Other chest pain: Secondary | ICD-10-CM | POA: Diagnosis not present

## 2022-05-16 DIAGNOSIS — R0602 Shortness of breath: Secondary | ICD-10-CM | POA: Diagnosis present

## 2022-05-16 DIAGNOSIS — J449 Chronic obstructive pulmonary disease, unspecified: Secondary | ICD-10-CM | POA: Insufficient documentation

## 2022-05-16 LAB — CBC WITH DIFFERENTIAL/PLATELET
Abs Immature Granulocytes: 0.02 10*3/uL (ref 0.00–0.07)
Basophils Absolute: 0.1 10*3/uL (ref 0.0–0.1)
Basophils Relative: 1 %
Eosinophils Absolute: 0.1 10*3/uL (ref 0.0–0.5)
Eosinophils Relative: 1 %
HCT: 37.2 % (ref 36.0–46.0)
Hemoglobin: 12.1 g/dL (ref 12.0–15.0)
Immature Granulocytes: 0 %
Lymphocytes Relative: 20 %
Lymphs Abs: 1.1 10*3/uL (ref 0.7–4.0)
MCH: 30.8 pg (ref 26.0–34.0)
MCHC: 32.5 g/dL (ref 30.0–36.0)
MCV: 94.7 fL (ref 80.0–100.0)
Monocytes Absolute: 0.7 10*3/uL (ref 0.1–1.0)
Monocytes Relative: 13 %
Neutro Abs: 3.5 10*3/uL (ref 1.7–7.7)
Neutrophils Relative %: 65 %
Platelets: 477 10*3/uL — ABNORMAL HIGH (ref 150–400)
RBC: 3.93 MIL/uL (ref 3.87–5.11)
RDW: 15.1 % (ref 11.5–15.5)
WBC: 5.4 10*3/uL (ref 4.0–10.5)
nRBC: 0 % (ref 0.0–0.2)

## 2022-05-16 LAB — COMPREHENSIVE METABOLIC PANEL
ALT: 10 U/L (ref 0–44)
AST: 19 U/L (ref 15–41)
Albumin: 3.9 g/dL (ref 3.5–5.0)
Alkaline Phosphatase: 73 U/L (ref 38–126)
Anion gap: 7 (ref 5–15)
BUN: 7 mg/dL — ABNORMAL LOW (ref 8–23)
CO2: 26 mmol/L (ref 22–32)
Calcium: 9.5 mg/dL (ref 8.9–10.3)
Chloride: 105 mmol/L (ref 98–111)
Creatinine, Ser: 0.69 mg/dL (ref 0.44–1.00)
GFR, Estimated: >60 mL/min (ref >60)
Glucose, Bld: 110 mg/dL — ABNORMAL HIGH (ref 70–99)
Potassium: 3.4 mmol/L — ABNORMAL LOW (ref 3.5–5.1)
Sodium: 138 mmol/L (ref 135–145)
Total Bilirubin: 0.8 mg/dL (ref 0.3–1.2)
Total Protein: 7.5 g/dL (ref 6.5–8.1)

## 2022-05-16 LAB — BRAIN NATRIURETIC PEPTIDE: B Natriuretic Peptide: 139.5 pg/mL — ABNORMAL HIGH (ref 0.0–100.0)

## 2022-05-16 LAB — TROPONIN I (HIGH SENSITIVITY)
Troponin I (High Sensitivity): 19 ng/L — ABNORMAL HIGH (ref <18)
Troponin I (High Sensitivity): 20 ng/L — ABNORMAL HIGH (ref <18)

## 2022-05-16 MED ORDER — ALUM & MAG HYDROXIDE-SIMETH 200-200-20 MG/5ML PO SUSP
15.0000 mL | Freq: Once | ORAL | Status: AC
  Administered 2022-05-16: 15 mL via ORAL
  Filled 2022-05-16: qty 30

## 2022-05-16 MED ORDER — LORAZEPAM 1 MG PO TABS
1.0000 mg | ORAL_TABLET | Freq: Once | ORAL | Status: AC
Start: 2022-05-16 — End: 2022-05-16
  Administered 2022-05-16: 1 mg via ORAL
  Filled 2022-05-16: qty 1

## 2022-05-16 MED ORDER — LIDOCAINE VISCOUS HCL 2 % MT SOLN
15.0000 mL | Freq: Once | OROMUCOSAL | Status: AC
  Administered 2022-05-16: 15 mL via ORAL
  Filled 2022-05-16: qty 15

## 2022-05-16 NOTE — ED Notes (Signed)
Verbalized understanding of discharge instructions and follow-up care instructions. Pt advised if symptoms worsen to return to ED.

## 2022-05-16 NOTE — ED Notes (Signed)
Urine sent to lab at this time.

## 2022-05-16 NOTE — ED Provider Notes (Signed)
[facility] Provider Note    Event Date/Time   First MD Initiated Contact with Patient 05/16/22 1333     (approximate)   History   Chief Complaint Shortness of Breath   HPI  Linda Brady is a 81 y.o. female with a past medical history of hypertension, COPD, GERD, and aortic stenosis who presents to the ED complaining of shortness of breath.  Patient reports that she has been dealing with intermittent difficulty breathing for the past 4 days.  She states that she frequently wakes up intermittently during the night "gasping for air."  She has also been dealing with intermittent pressure and tightness in the center of her chest.  She states that these symptoms seem to get worse whenever she feels anxious.  She currently describes a sensation of burning in her chest that she states feels similar to GERD.  She denies any recent fevers or cough.  She does report some recent swelling in her legs and is concerned that she is retaining fluid after being taken off of her diuretic medication.     Physical Exam   Triage Vital Signs: ED Triage Vitals  Enc Vitals Group     BP 05/16/22 1335 (!) 184/81     Pulse Rate 05/16/22 1335 85     Resp 05/16/22 1335 (!) 21     Temp 05/16/22 1335 98.3 F (36.8 C)     Temp Source 05/16/22 1335 Oral     SpO2 05/16/22 1335 96 %     Weight 05/16/22 1337 180 lb (81.6 kg)     Height 05/16/22 1337 5\' 3"  (1.6 m)     Head Circumference --      Peak Flow --      Pain Score 05/16/22 1336 0     Pain Loc --      Pain Edu? --      Excl. in GC? --     Most recent vital signs: Vitals:   05/16/22 1400 05/16/22 1512  BP: (!) 172/75 (!) 164/89  Pulse: 85 84  Resp: 17   Temp:    SpO2: 96% 95%    Constitutional: Alert and oriented. Eyes: Conjunctivae are normal. Head: Atraumatic. Nose: No congestion/rhinnorhea. Mouth/Throat: Mucous membranes are moist.  Cardiovascular: Normal rate, regular rhythm. Grossly normal heart sounds.   2+ radial pulses bilaterally. Respiratory: Normal respiratory effort.  No retractions. Lungs CTAB. Gastrointestinal: Soft and nontender. No distention. Musculoskeletal: No lower extremity tenderness, 1+ pitting edema to mid shins bilaterally. Neurologic:  Normal speech and language. No gross focal neurologic deficits are appreciated.    ED Results / Procedures / Treatments   Labs (all labs ordered are listed, but only abnormal results are displayed) Labs Reviewed  CBC WITH DIFFERENTIAL/PLATELET - Abnormal; Notable for the following components:      Result Value   Platelets 477 (*)    All other components within normal limits  COMPREHENSIVE METABOLIC PANEL - Abnormal; Notable for the following components:   Potassium 3.4 (*)    Glucose, Bld 110 (*)    BUN 7 (*)    All other components within normal limits  TROPONIN I (HIGH SENSITIVITY) - Abnormal; Notable for the following components:   Troponin I (High Sensitivity) 19 (*)    All other components within normal limits  BRAIN NATRIURETIC PEPTIDE  TROPONIN I (HIGH SENSITIVITY)     EKG  ED ECG REPORT I, 05/18/22, the attending physician, personally viewed and interpreted this ECG.  Date: 05/16/2022  EKG Time: 13:32  Rate: 88  Rhythm: normal sinus rhythm  Axis: Normal  Intervals:none  ST&T Change: None  RADIOLOGY Chest x-ray reviewed and interpreted by me with no infiltrate, edema, or effusion.  PROCEDURES:  Critical Care performed: No  Procedures   MEDICATIONS ORDERED IN ED: Medications  LORazepam (ATIVAN) tablet 1 mg (1 mg Oral Given 05/16/22 1510)  alum & mag hydroxide-simeth (MAALOX/MYLANTA) 200-200-20 MG/5ML suspension 15 mL (15 mLs Oral Given 05/16/22 1510)    And  lidocaine (XYLOCAINE) 2 % viscous mouth solution 15 mL (15 mLs Oral Given 05/16/22 1510)     IMPRESSION / MDM / ASSESSMENT AND PLAN / ED COURSE  I reviewed the triage vital signs and the nursing notes.                              81  y.o. female with past medical history of hypertension, COPD, aortic stenosis, and GERD who presents to the ED complaining of intermittent chest tightness and difficulty breathing for the past 4 days.  Patient's presentation is most consistent with acute presentation with potential threat to life or bodily function.  Differential diagnosis includes, but is not limited to, ACS, PE, pneumonia, pneumothorax, COPD exacerbation, CHF, bronchitis, GERD, anxiety.  Patient well-appearing and in no acute distress, currently breathing comfortably and maintaining O2 sats on room air.  She has no wheezing on examination, does not appear significantly fluid overloaded.  Chest x-ray is unremarkable, does show questionable infiltrate but no symptoms at this time to suggest pneumonia.  Labs are reassuring with no significant anemia or leukocytosis, BMP without electrolyte abnormality or AKI, LFTs within normal limits.  Her troponin is very mildly elevated, however she has dealt with elevated troponin in the past and this does not appear significantly different from her prior baseline.  We will trend troponin, but if this is within normal limits then I doubt acute cardiac or respiratory pathology.  Low suspicion for PE at this time given reassuring vital signs with atypical intermittent symptoms.  She does appear to have components of anxiety and GERD, plan to treat with GI cocktail and Ativan, reassess following repeat troponin.  Patient turned over to oncoming provider pending repeat troponin, patient would be appropriate for discharge home with PCP follow-up if this is unremarkable.      FINAL CLINICAL IMPRESSION(S) / ED DIAGNOSES   Final diagnoses:  Shortness of breath  Chest pain, unspecified type     Rx / DC Orders   ED Discharge Orders     None        Note:  This document was prepared using Dragon voice recognition software and may include unintentional dictation errors.   Chesley Noon,  MD 05/16/22 [phone]

## 2022-05-16 NOTE — Discharge Instructions (Addendum)
Your work-up today was overall reassuring.  I would like to refer you to a cardiologist for further evaluation of your chest pain.  If your chest pain returns or is different or worsening please return to the emergency department.

## 2022-05-16 NOTE — ED Provider Notes (Signed)
Patient signed out to me pending a repeat troponin.  She is a 81 year old female who presents with shortness of breath and chest pain.  Work-up so far has been reassuring.  Initial troponin is 19 she is pending a repeat at the time of signout plan is to discharge if patient feeling okay and not significantly changed.  Repeat troponin is 20.  I evaluated the patient she feels improved has no increased work of breathing and chest pain is gone.  Will refer to cardiology I think she is appropriate for discharge.   Georga Hacking, MD 05/16/22 1719

## 2022-05-16 NOTE — ED Triage Notes (Signed)
Pt presents to ED via [facility] with c/o of SOB that has been ongoing for 4 days. 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 EMS states RA O2 sat was 88%.  Pt does endorse night sweats and waking up intermittently during the night "gasping for air". Pt is NAD, pt is from home and is currently A&Ox4.   EMS gave 1 duoneb PTA, EMS states wheezing throughout all felids and states after breathing TX, pt is less wheezy at this time.   Pt does have HX of CHF but states they took her of diuretic last month, pt is not sure of why but states her swelling in BLE has gotten worse since this.

## 2022-06-13 ENCOUNTER — Emergency Department
Admission: EM | Admit: 2022-06-13 | Discharge: 2022-06-13 | Disposition: A | Discharge status: Home / Self Care | Payer: Medicare Other | Attending: Emergency Medicine | Admitting: Emergency Medicine

## 2022-06-13 ENCOUNTER — Other Ambulatory Visit: Payer: Self-pay

## 2022-06-13 ENCOUNTER — Encounter: Payer: Self-pay | Admitting: Emergency Medicine

## 2022-06-13 ENCOUNTER — Emergency Department: Payer: Medicare Other

## 2022-06-13 DIAGNOSIS — I10 Essential (primary) hypertension: Secondary | ICD-10-CM | POA: Diagnosis not present

## 2022-06-13 DIAGNOSIS — G8929 Other chronic pain: Secondary | ICD-10-CM | POA: Diagnosis not present

## 2022-06-13 DIAGNOSIS — J441 Chronic obstructive pulmonary disease with (acute) exacerbation: Secondary | ICD-10-CM | POA: Insufficient documentation

## 2022-06-13 DIAGNOSIS — R109 Unspecified abdominal pain: Secondary | ICD-10-CM | POA: Diagnosis not present

## 2022-06-13 DIAGNOSIS — Z8616 Personal history of COVID-19: Secondary | ICD-10-CM | POA: Diagnosis not present

## 2022-06-13 DIAGNOSIS — I2510 Atherosclerotic heart disease of native coronary artery without angina pectoris: Secondary | ICD-10-CM | POA: Diagnosis not present

## 2022-06-13 DIAGNOSIS — R0602 Shortness of breath: Secondary | ICD-10-CM | POA: Diagnosis present

## 2022-06-13 LAB — CBC
HCT: 38.5 % (ref 36.0–46.0)
Hemoglobin: 12.8 g/dL (ref 12.0–15.0)
MCH: 30.1 pg (ref 26.0–34.0)
MCHC: 33.2 g/dL (ref 30.0–36.0)
MCV: 90.6 fL (ref 80.0–100.0)
Platelets: 387 10*3/uL (ref 150–400)
RBC: 4.25 MIL/uL (ref 3.87–5.11)
RDW: 14.5 % (ref 11.5–15.5)
WBC: 5 10*3/uL (ref 4.0–10.5)
nRBC: 0 % (ref 0.0–0.2)

## 2022-06-13 LAB — BASIC METABOLIC PANEL
Anion gap: 12 (ref 5–15)
BUN: 9 mg/dL (ref 8–23)
CO2: 22 mmol/L (ref 22–32)
Calcium: 9.7 mg/dL (ref 8.9–10.3)
Chloride: 97 mmol/L — ABNORMAL LOW (ref 98–111)
Creatinine, Ser: 0.87 mg/dL (ref 0.44–1.00)
GFR, Estimated: >60 mL/min (ref >60)
Glucose, Bld: 121 mg/dL — ABNORMAL HIGH (ref 70–99)
Potassium: 3.4 mmol/L — ABNORMAL LOW (ref 3.5–5.1)
Sodium: 131 mmol/L — ABNORMAL LOW (ref 135–145)

## 2022-06-13 LAB — TROPONIN I (HIGH SENSITIVITY)
Troponin I (High Sensitivity): 8 ng/L (ref <18)
Troponin I (High Sensitivity): 9 ng/L (ref <18)

## 2022-06-13 LAB — D-DIMER, QUANTITATIVE: D-Dimer, Quant: 0.9 ug/mL-FEU — ABNORMAL HIGH (ref 0.00–0.50)

## 2022-06-13 MED ORDER — PREDNISONE 20 MG PO TABS
60.0000 mg | ORAL_TABLET | Freq: Once | ORAL | Status: AC
  Administered 2022-06-13: 60 mg via ORAL
  Filled 2022-06-13: qty 3

## 2022-06-13 MED ORDER — PREDNISONE 50 MG PO TABS: ORAL_TABLET | ORAL | 0 refills | Status: DC

## 2022-06-13 MED ORDER — IPRATROPIUM-ALBUTEROL 0.5-2.5 (3) MG/3ML IN SOLN
3.0000 mL | Freq: Once | RESPIRATORY_TRACT | Status: AC
  Administered 2022-06-13: 3 mL via RESPIRATORY_TRACT
  Filled 2022-06-13: qty 3

## 2022-06-13 MED ORDER — IOHEXOL 350 MG/ML SOLN
75.0000 mL | Freq: Once | INTRAVENOUS | Status: AC | PRN
  Administered 2022-06-13: 75 mL via INTRAVENOUS

## 2022-06-13 NOTE — ED Provider Notes (Signed)
[facility] Provider Note    Event Date/Time   First MD Initiated Contact with Patient 06/13/22 1454     (approximate)   History   Chest Pain and Shortness of Breath   HPI  Linda Brady is a 81 y.o. female with past medical history of COPD hypertension who presents with shortness of breath.  Symptoms started today.  She endorses pain in the bilateral lungs points to the back mid thoracic region says pain is worse with breathing and worse with lying on her back.  She denies central chest pain.  Feels short of breath both at rest and with exertion her chest pain is worse with coughing and breathing but is not exertional does endorse some feet swelling.  Denies fevers chills.  Has had mild cough that is minimally productive.  She has been using her inhalers at home.  Denies prior history of heart failure.  She has chronic abdominal pain this is unchanged no nausea vomiting diarrhea.     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   Chronic heart failure with preserved ejection fraction (HFpEF) (HCC) 12/16/2021   Elevated troponin 12/15/2021   Anxiety 12/15/2021   Coronary artery disease involving native coronary artery of native heart    Aortic stenosis, moderate    COPD with acute exacerbation (HCC) 12/14/2021   Hyponatremia 12/14/2021   Dyslipidemia 12/14/2021   GERD (gastroesophageal reflux disease) 12/14/2021   Vitamin B12 deficiency 12/14/2021   S/P cardiac catheterization 09/19/2020   Chest pain 09/10/2020   Hypokalemia 09/10/2020   Unstable angina (HCC) 09/10/2020   Hypothyroidism    COPD (chronic obstructive pulmonary disease) (HCC)    Essential hypertension      Physical Exam   Triage Vital Signs: ED Triage Vitals  Enc Vitals Group     BP 06/13/22 1317 (!) 162/84     Pulse Rate 06/13/22 1317 93     Resp 06/13/22 1317 20     Temp 06/13/22 1317 98.2 F (36.8 C)     Temp Source 06/13/22 1317 Oral     SpO2 06/13/22 1317 95 %     Weight 06/13/22 1315 180 lb (81.6 kg)     Height 06/13/22 1315 5\' 3"  (1.6 m)     Head Circumference --      Peak Flow --      Pain Score 06/13/22 1315 6     Pain Loc --      Pain Edu? --      Excl. in [place]? --     Most recent vital signs: Vitals:   06/13/22 1317 06/13/22 1630  BP: (!) 162/84 (!) 161/87  Pulse: 93 91  Resp: 20 19  Temp: 98.2 F (36.8 C)   SpO2: 95% 99%     General: Awake, no distress.  CV:  Good peripheral perfusion.  Trace pitting edema in the ankles Resp:  Normal effort.  Lungs are clear good air movement no increased work of breathing Abd:  No distention.  Neuro:             Awake, Alert, Oriented x 3  Other:     ED Results / Procedures / Treatments  Labs (all labs ordered are  listed, but only abnormal results are displayed) Labs Reviewed  BASIC METABOLIC PANEL - Abnormal; Notable for the following components:      Result Value   Sodium 131 (*)    Potassium 3.4 (*)    Chloride 97 (*)    Glucose, Bld 121 (*)    All other components within normal limits  D-DIMER, QUANTITATIVE - Abnormal; Notable for the following components:   D-Dimer, Quant 0.90 (*)    All other components within normal limits  CBC  TROPONIN I (HIGH SENSITIVITY)  TROPONIN I (HIGH SENSITIVITY)     EKG  EKG reviewed and interpreted by myself shows normal sinus rhythm with a left axis deviation inferior and septal Q waves no acute ischemic changes   RADIOLOGY I reviewed and interpreted the CXR which does not show any acute cardiopulmonary process    PROCEDURES:  Critical Care performed: No  Procedures   MEDICATIONS ORDERED IN ED: Medications  predniSONE (DELTASONE) tablet 60 mg (has no administration in time  range)  ipratropium-albuterol (DUONEB) 0.5-2.5 (3) MG/3ML nebulizer solution 3 mL (3 mLs Nebulization Given 06/13/22 1616)  iohexol (OMNIPAQUE) 350 MG/ML injection 75 mL (75 mLs Intravenous Contrast Given 06/13/22 1753)     IMPRESSION / MDM / ASSESSMENT AND PLAN / ED COURSE  I reviewed the triage vital signs and the nursing notes.                              Patient's presentation is most consistent with acute presentation with potential threat to life or bodily function.  Differential diagnosis includes, but is not limited to, OPD exacerbation, viral illness, bacterial pneumonia, pulmonary embolism, ACS, pleural effusion  The patient is a 81 year old female with history of COPD who presents with dyspnea and bilateral chest/back pain.  Symptoms really started today she has had a minor cough that is nonproductive no fevers chills.  She says both her lungs hurt but is not having any central anterior chest pain.  She describes subjective dyspnea.  On exam she overall looks well she is not in respiratory distress her lungs are clear without wheezing she has minimal lower extremity edema she is satting 100% on room air is not tachypneic or tachycardic blood pressure mildly elevated.  Reviewed her chest x-ray which does not have any focal abnormality.  EKG does not have any acute ischemic changes.  Troponins x2 are negative.  Given the pleuritic chest pain I obtained a D-dimer which was positive so CTA was obtained.  Does not show any acute PE and there are resolving infiltrates.  Patient was given a DuoNeb to see if this symptomatically helped her dyspnea and it did.  Suspect a mild COPD exacerbation.  Given she is not hypoxic and not in respiratory distress do not feel that she needs admission.  Will discharge with 5-day course of prednisone.  Discussed PCP follow-up.       FINAL CLINICAL IMPRESSION(S) / ED DIAGNOSES   Final diagnoses:  COPD exacerbation (HCC)     Rx / DC Orders   ED  Discharge Orders          Ordered    predniSONE (DELTASONE) 50 MG tablet        06/13/22 1837             Note:  This document was prepared using Dragon voice recognition software and may include unintentional dictation errors.   Georga Hacking, MD 06/13/22 1840

## 2022-06-13 NOTE — ED Triage Notes (Signed)
Pt via EMS from home. Pt c/o SOB and chest pressure, mid sternal but non-radiating. States that it started last night. States she has been coughing with minimal sputum. Pt has a hx of COPD but denies being on any O2. Pt is HOH. Denies blood thinners. Pt is A&OX4 and NAD

## 2022-06-13 NOTE — Discharge Instructions (Addendum)
Your CAT scan did not show any blood clot.  Your blood work was overall reassuring.  You may be having exacerbation of your COPD.  Please take the prednisone once daily for the next 5 days.  If you have worsening symptoms please return to the emergency department.  Please follow-up with your primary care provider.

## 2022-06-13 NOTE — ED Notes (Signed)
First Nurse Note: Pt to ED via [facility] from home for shortness of breath pain in her lungs and chest area. Pt also reporting anxiety per EMS. Pt was given 325 mg of Aspirin and 1 spray of nitro (0.4mg ). Last BP: 142/49, HR 101  Pt has a 22 G IV in her left hand.

## 2022-06-13 NOTE — ED Notes (Signed)
Pt verbalized understanding of discharge instructions, prescription, and follow-up care instructions. Pt advised if symptoms worsen to return to ED. Pt's daughter accompanied pt to take pt home.

## 2022-07-22 ENCOUNTER — Ambulatory Visit: Admitting: Medical

## 2022-08-14 ENCOUNTER — Ambulatory Visit: Payer: Medicare Other | Admitting: Medical

## 2022-08-25 ENCOUNTER — Emergency Department: Payer: Medicare Other

## 2022-08-25 ENCOUNTER — Other Ambulatory Visit: Payer: Self-pay

## 2022-08-25 ENCOUNTER — Emergency Department
Admission: EM | Admit: 2022-08-25 | Discharge: 2022-08-25 | Disposition: A | Discharge status: Home / Self Care | Payer: Medicare Other | Attending: Emergency Medicine | Admitting: Emergency Medicine

## 2022-08-25 DIAGNOSIS — R079 Chest pain, unspecified: Secondary | ICD-10-CM | POA: Diagnosis not present

## 2022-08-25 DIAGNOSIS — I509 Heart failure, unspecified: Secondary | ICD-10-CM | POA: Insufficient documentation

## 2022-08-25 DIAGNOSIS — J449 Chronic obstructive pulmonary disease, unspecified: Secondary | ICD-10-CM | POA: Insufficient documentation

## 2022-08-25 DIAGNOSIS — I2510 Atherosclerotic heart disease of native coronary artery without angina pectoris: Secondary | ICD-10-CM | POA: Diagnosis not present

## 2022-08-25 DIAGNOSIS — R0602 Shortness of breath: Secondary | ICD-10-CM | POA: Insufficient documentation

## 2022-08-25 LAB — COMPREHENSIVE METABOLIC PANEL
ALT: 15 U/L (ref 0–44)
AST: 21 U/L (ref 15–41)
Albumin: 4 g/dL (ref 3.5–5.0)
Alkaline Phosphatase: 78 U/L (ref 38–126)
Anion gap: 11 (ref 5–15)
BUN: 16 mg/dL (ref 8–23)
CO2: 24 mmol/L (ref 22–32)
Calcium: 9.6 mg/dL (ref 8.9–10.3)
Chloride: 97 mmol/L — ABNORMAL LOW (ref 98–111)
Creatinine, Ser: 0.92 mg/dL (ref 0.44–1.00)
GFR, Estimated: >60 mL/min (ref >60)
Glucose, Bld: 123 mg/dL — ABNORMAL HIGH (ref 70–99)
Potassium: 3.7 mmol/L (ref 3.5–5.1)
Sodium: 132 mmol/L — ABNORMAL LOW (ref 135–145)
Total Bilirubin: 0.6 mg/dL (ref 0.3–1.2)
Total Protein: 7.6 g/dL (ref 6.5–8.1)

## 2022-08-25 LAB — CBC
HCT: 38.6 % (ref 36.0–46.0)
Hemoglobin: 12.8 g/dL (ref 12.0–15.0)
MCH: 29.4 pg (ref 26.0–34.0)
MCHC: 33.2 g/dL (ref 30.0–36.0)
MCV: 88.7 fL (ref 80.0–100.0)
Platelets: 413 10*3/uL — ABNORMAL HIGH (ref 150–400)
RBC: 4.35 MIL/uL (ref 3.87–5.11)
RDW: 13.7 % (ref 11.5–15.5)
WBC: 7.5 10*3/uL (ref 4.0–10.5)
nRBC: 0 % (ref 0.0–0.2)

## 2022-08-25 LAB — TROPONIN I (HIGH SENSITIVITY)
Troponin I (High Sensitivity): 9 ng/L (ref <18)
Troponin I (High Sensitivity): 9 ng/L (ref <18)

## 2022-08-25 LAB — LIPASE, BLOOD: Lipase: 42 U/L (ref 11–51)

## 2022-08-25 MED ORDER — IPRATROPIUM-ALBUTEROL 0.5-2.5 (3) MG/3ML IN SOLN
3.0000 mL | Freq: Once | RESPIRATORY_TRACT | Status: AC
  Administered 2022-08-25: 3 mL via RESPIRATORY_TRACT
  Filled 2022-08-25: qty 3

## 2022-08-25 MED ORDER — ACETAMINOPHEN 325 MG PO TABS
650.0000 mg | ORAL_TABLET | Freq: Once | ORAL | Status: AC
  Administered 2022-08-25: 650 mg via ORAL
  Filled 2022-08-25: qty 2

## 2022-08-25 MED ORDER — TRAZODONE HCL 50 MG PO TABS
50.0000 mg | ORAL_TABLET | ORAL | Status: AC
  Administered 2022-08-25: 50 mg via ORAL
  Filled 2022-08-25: qty 1

## 2022-08-25 NOTE — ED Triage Notes (Signed)
Pt arrives from home via [facility].  C/O left mid chest pain w/radiation under left breast. 324 asa given by EMS. Pt A&Ox4, reports dizziness and SHOB.

## 2022-08-25 NOTE — ED Provider Notes (Signed)
[facility] Provider Note    Event Date/Time   First MD Initiated Contact with Patient 08/25/22 0127     (approximate)   History   Chest Pain   HPI  Linda Brady is a 81 y.o. female with past medical history that includes COPD, coronary artery disease, chronic heart failure with preserved ejection fraction, anxiety, chronic abdominal pain.  She presents tonight for evaluation of chest pain.  She reports that she was having some pain in the left side of her chest.  Some shortness of breath, but she says it is difficult to appreciate the difference between that and her usual COPD.  When she talk to me, she was not reporting some shortness of breath his pain in the middle of her back although that has improved as well.  She also tells me that she is anxious and would like something for anxiety, and that her legs are cramping and restless and she would like something for that.  She states that she has been using her regular medications.     Physical Exam   Triage Vital Signs: ED Triage Vitals  Enc Vitals Group     BP 08/25/22 0123 (!) 173/84     Pulse Rate 08/25/22 0123 85     Resp 08/25/22 0123 (!) 23     Temp 08/25/22 0132 97.8 F (36.6 C)     Temp Source 08/25/22 0132 Oral     SpO2 08/25/22 0123 97 %     Weight 08/25/22 0125 89.6 kg (197 lb 8 oz)     Height 08/25/22 0125 1.6 m (5\' 3" )     Head Circumference --      Peak Flow --      Pain Score 08/25/22 0124 8     Pain Loc --      Pain Edu? --      Excl. in [place]? --     Most recent vital signs: Vitals:   08/25/22 0517 08/25/22 0530  BP:  119/60  Pulse:  83  Resp:  19  Temp: 97.6 F (36.4 C)   SpO2:  97%     General: Awake, no distress.  CV:  Good peripheral perfusion.  Normal heart sounds. Resp:  Normal effort.  Lungs are clear to auscultation.  She has had very slight end expiratory wheeze on the left side, but overall is moving good air.  She is not using accessory muscles and has no  intercostal retractions. Abd:  No distention.  No tenderness to palpation of the abdomen. Other:  No focal neurological deficits.   ED Results / Procedures / Treatments   Labs (all labs ordered are listed, but only abnormal results are displayed) Labs Reviewed  CBC - Abnormal; Notable for the following components:      Result Value   Platelets 413 (*)    All other components within normal limits  COMPREHENSIVE METABOLIC PANEL - Abnormal; Notable for the following components:   Sodium 132 (*)    Chloride 97 (*)    Glucose, Bld 123 (*)    All other components within normal limits  LIPASE, BLOOD  TROPONIN I (HIGH SENSITIVITY)  TROPONIN I (HIGH SENSITIVITY)     EKG  ED ECG REPORT I, Hinda Kehr, the attending physician, personally viewed and interpreted this ECG.  Date: 08/25/2022 EKG Time: 1:25 AM Rate: 87 Rhythm: normal sinus rhythm QRS Axis: normal Intervals: normal ST/T Wave abnormalities: Non-specific ST segment / T-wave changes, but no  clear evidence of acute ischemia. Narrative Interpretation: no definitive evidence of acute ischemia; does not meet STEMI criteria.    RADIOLOGY I viewed and interpreted the patient's 1 view chest x-ray.  I see no evidence of pneumonia nor pneumothorax.  I also read the radiologist's report, which confirmed no acute findings.    PROCEDURES:  Critical Care performed: No  .1-3 Lead EKG Interpretation  Performed by: Loleta Rose, MD Authorized by: Loleta Rose, MD     Interpretation: normal     ECG rate:  83   ECG rate assessment: normal     Rhythm: sinus rhythm     Ectopy: none     Conduction: normal      MEDICATIONS ORDERED IN ED: Medications  ipratropium-albuterol (DUONEB) 0.5-2.5 (3) MG/3ML nebulizer solution 3 mL (3 mLs Nebulization Given 08/25/22 0249)  traZODone (DESYREL) tablet 50 mg (50 mg Oral Given 08/25/22 0249)  acetaminophen (TYLENOL) tablet 650 mg (650 mg Oral Given 08/25/22 0435)     IMPRESSION / MDM  / ASSESSMENT AND PLAN / ED COURSE  I reviewed the triage vital signs and the nursing notes.                              Differential diagnosis includes, but is not limited to, acute on chronic pain, ACS, PE, pneumonia, pneumothorax, COPD exacerbation.  Patient's presentation is most consistent with acute presentation with potential threat to life or bodily function.  Labs/studies ordered: EKG, 1 view chest x-ray, CBC, lipase, comprehensive metabolic panel, high-sensitivity troponin x2.  The patient is on the cardiac monitor to evaluate for evidence of arrhythmia and/or significant heart rate changes.  EKG shows no evidence of ischemia.  Vital signs are stable and within normal limits.  Chest x-ray normal as documented above.  High-sensitivity troponin normal x2.  There is no evidence that the patient is having an acute or emergent medical condition at this time.  I believe that her symptoms are the result of an exacerbation of her chronic conditions and likely exacerbated as well by anxiety.  I ordered trazodone 50 mg and acetaminophen 650 mg to help with her discomfort and her anxiety, and I gave her a DuoNeb because of the history of COPD.  However there is no indication she needs additional treatment or hospitalization.  I explained to the to the patient and she agrees with the plan for outpatient follow-up and said that she feels better knowing that nothing new or different is happening.  I provided an outpatient referral to cardiology and recommended she also follow-up with her primary care provider.  I gave my usual and customary return precautions.     FINAL CLINICAL IMPRESSION(S) / ED DIAGNOSES   Final diagnoses:  Chest pain, unspecified type     Rx / DC Orders   ED Discharge Orders          Ordered    Ambulatory referral to Cardiology       Comments: If you have not heard from the Cardiology office within the next 72 hours please call [phone].   08/25/22 0532              Note:  This document was prepared using Dragon voice recognition software and may include unintentional dictation errors.   Loleta Rose, MD 08/25/22 [phone]

## 2022-08-25 NOTE — Discharge Instructions (Signed)
Your workup in the Emergency Department today was reassuring.  We did not find any specific abnormalities.  We recommend you drink plenty of fluids, take your regular medications and/or any new ones prescribed today, and follow up with the doctor(s) listed in these documents as recommended.  Return to the Emergency Department if you develop new or worsening symptoms that concern you.  

## 2022-09-10 ENCOUNTER — Encounter: Payer: Self-pay | Admitting: Medical

## 2022-09-10 ENCOUNTER — Telehealth: Payer: Self-pay | Admitting: Cardiology

## 2022-09-10 ENCOUNTER — Ambulatory Visit: Payer: Medicare Other | Attending: Medical | Admitting: Medical

## 2022-09-10 VITALS — BP 136/66 | HR 101 | Ht 63.0 in | Wt 189.0 lb

## 2022-09-10 DIAGNOSIS — I10 Essential (primary) hypertension: Secondary | ICD-10-CM | POA: Insufficient documentation

## 2022-09-10 DIAGNOSIS — R109 Unspecified abdominal pain: Secondary | ICD-10-CM | POA: Diagnosis not present

## 2022-09-10 DIAGNOSIS — I2510 Atherosclerotic heart disease of native coronary artery without angina pectoris: Secondary | ICD-10-CM | POA: Diagnosis not present

## 2022-09-10 MED ORDER — HYDROCHLOROTHIAZIDE 25 MG PO TABS: 12.5000 mg | ORAL_TABLET | Freq: Every day | ORAL | 1 refills | Status: DC

## 2022-09-10 MED ORDER — ISOSORBIDE MONONITRATE ER 30 MG PO TB24: 15.0000 mg | ORAL_TABLET | Freq: Every day | ORAL | 1 refills | Status: DC

## 2022-09-10 MED ORDER — ENALAPRIL MALEATE 20 MG PO TABS: 20.0000 mg | ORAL_TABLET | Freq: Two times a day (BID) | ORAL | 1 refills | Status: DC

## 2022-09-10 NOTE — Telephone Encounter (Signed)
I called and spoke with the patient. She states she reviewed her medication list from her AVS at discharge from her [place] today. She noticed that Pantoprazole 40 mg once daily is on her list, but she is not taking this. She did advise that she is taking Rabeprazole (Aciphex) 20 mg once daily and has been for ~ 2 years. She currently has mild acid reflux symptoms, but wanted to know if pantoprazole would be of more benefit to her.  I advised the patient these 2 medications are both in the same class of drug, but I am unsure if one would be better than another. She is aware I will forward to Cadence Elkader, [place], who she saw today to advise further.  The patient is aware she may need to follow up with her PCP about this as well.   She will await a call back from [place] to clarify. She was appreciative of the call today.   I have also corrected the patient's medication list as to what she is currently taking.

## 2022-09-10 NOTE — Telephone Encounter (Signed)
Pt c/o medication issue:  1. Name of Medication:  pantoprazole (PROTONIX) 40 MG tablet  2. How are you currently taking this medication (dosage and times per day)?   3. Are you having a reaction (difficulty breathing--STAT)?   4. What is your medication issue?   Patient states she was given a medication list during appointment today with Cadence Furth. She went home to confirm accuracy of the list and she discovered Pantoprazole has not been on her list at home. She would like a call back to discuss.

## 2022-09-10 NOTE — Progress Notes (Signed)
Cardiology Office Note:    Date:  09/10/2022   ID:  [name], DOB Dec 12, 1940, MRN [id number]  PCP:  Linda Pink, MD  [facility] Cardiologist:  Linda Sable, MD  [facility] Electrophysiologist:  None   Referring MD: Linda Pink, MD   Chief Complaint: ER follow-up  History of Present Illness:    Linda Brady is a 81 y.o. female with a hx of CAD, HTN, COPD who presents for ER follow-up  Patient has a history of CAD with 50% proximal RCA lesion and 30% LAD lesion.  She was seen in January 2023 for chest pain during an admission.  Troponins were minimally elevated, representing demand supply mismatch.  Symptoms improved with inhalers.  Echo showed preserved LVEF.  She was started on Imdur.  Plan was for outpatient stress testing.  Seen in the ER 08/25/2022 for chest pain.  Blood pressure was elevated 170s over 80s.  Troponin was negative x2.  [place], the ptient reports flank pain that goes into the abdomen, abdominal pain started 3 days ago and has gotten worse. She is not having diarrhea or constipation. Reported intermittent chills and night sweats. Appetite is low due to stomach pain. She is still drinking. She had chronic SOB given COPD. BP is good today, she is unsure if she is taking Imdur.   Past Medical History:  Diagnosis Date   COPD (chronic obstructive pulmonary disease) ([place])    Hypertension    Thyroid disease     Past Surgical History:  Procedure Laterality Date   BREAST CYST ASPIRATION Left 10 plus yrs   benign-twice   LEFT HEART CATH AND CORONARY ANGIOGRAPHY N/A 09/11/2020   Procedure: LEFT HEART CATH AND CORONARY ANGIOGRAPHY;  Surgeon: Linda Cowman, MD;  Location: [place] [facility];  Service: Cardiovascular;  Laterality: N/A;    Current Medications: Current Meds  Medication Sig   albuterol (VENTOLIN HFA) 108 (90 Base) MCG/ACT inhaler Inhale 2 puffs into the lungs every 4 (four) hours as needed for wheezing or shortness  of breath.   aspirin 81 MG EC tablet Take 81 mg by mouth daily.   cholecalciferol (VITAMIN D3) 25 MCG (1000 UNIT) tablet Take 1,000 Units by mouth daily.   FLUoxetine (PROZAC) 20 MG capsule Take 20 mg by mouth daily.   hydrOXYzine (ATARAX/VISTARIL) 25 MG tablet Take 25 mg by mouth 3 (three) times daily as needed for anxiety.   Ipratropium-Albuterol (COMBIVENT) 20-100 MCG/ACT AERS respimat Inhale 1 puff into the lungs 4 (four) times daily.   levothyroxine (SYNTHROID) 75 MCG tablet Take 75 mcg by mouth daily.   pantoprazole (PROTONIX) 40 MG tablet Take 1 tablet (40 mg total) by mouth daily.   simvastatin (ZOCOR) 20 MG tablet Take 10 mg by mouth daily.   vitamin B-12 (CYANOCOBALAMIN) 1000 MCG tablet Take 1,000 mcg by mouth daily.   [DISCONTINUED] enalapril (VASOTEC) 20 MG tablet Take 1 tablet by mouth 2 (two) times daily.   [DISCONTINUED] hydrochlorothiazide (HYDRODIURIL) 25 MG tablet Take 0.5 tablets by mouth daily.   [DISCONTINUED] isosorbide mononitrate (IMDUR) 30 MG 24 hr tablet Take 0.5 tablets (15 mg total) by mouth daily.     Allergies:   Naproxen sodium   Social History   Socioeconomic History   Marital status: Widowed    Spouse name: Not on file   Number of children: Not on file   Years of education: Not on file   Highest education level: Not on file  Occupational History   Not on file  Tobacco Use   Smoking status: Former    Packs/day: 0.50    Years: 12.00    Total pack years: 6.00    Types: Cigarettes   Smokeless tobacco: Never   Tobacco comments:    7-8 cigarettes a day  Vaping Use   Vaping Use: Never used  Substance and Sexual Activity   Alcohol use: Not Currently   Drug use: Not Currently   Sexual activity: Not Currently  Other Topics Concern   Not on file  Social History Narrative   Not on file   Social Determinants of Health   Financial Resource Strain: Not on file  Food Insecurity: Not on file  Transportation Needs: Not on file  Physical Activity: Not  on file  Stress: Not on file  Social Connections: Not on file     Family History: The patient's family history is negative for Breast cancer.  ROS:   Please see the history of present illness.     All other systems reviewed and are negative.  EKGs/Labs/Other Studies Reviewed:    The following studies were reviewed today:  Echo 12/2021  1. Left ventricular ejection fraction, by estimation, is 65 to 70%. The  left ventricle has normal function. The left ventricle has no regional  wall motion abnormalities. Left ventricular diastolic parameters are  consistent with Grade II diastolic  dysfunction (pseudonormalization).   2. Right ventricular systolic function is normal. The right ventricular  size is normal.   3. The mitral valve is degenerative. No evidence of mitral valve  regurgitation.   4. Aortic valve DVI=0.38, Vmax 46m/s, mean gradient 15mmHg, PG 75mmHg AVA  1.2cm2.. The aortic valve is calcified. Aortic valve regurgitation is  mild. Moderate aortic valve stenosis.   5. The inferior vena cava is normal in size with greater than 50%  respiratory variability, suggesting right atrial pressure of 3 mmHg.   Echo 2021 1. Left ventricular ejection fraction, by estimation, is 55 to 60%. The  left ventricle has normal function. The left ventricle has no regional  wall motion abnormalities. Left ventricular diastolic parameters were  normal.   2. Right ventricular systolic function is normal. The right ventricular  size is normal.   3. The mitral valve is normal in structure. No evidence of mitral valve  regurgitation.   4. The aortic valve is normal in structure. Aortic valve regurgitation is  not visualized.   Cardiac cath 09/2020 Ost RCA to Prox RCA lesion is 50% stenosed. Prox LAD lesion is 30% stenosed.   1.  Insignificant coronary artery disease with 50% stenosis ostium RCA 2.  Normal left ventricular function   Recommendations   1.  Continue medical therapy 2.   Aggressive risk factor modification 3.  May discharge home 4.  Follow-up with Dr. Ubaldo Brady in 1 week      EKG:  EKG is ordered today.  The ekg ordered today demonstrates ST 101bpm, nonspecific ST/T wave changes  Recent Labs: 12/16/2021: TSH 1.547 04/14/2022: Magnesium 2.2 05/16/2022: B Natriuretic Peptide 139.5 08/25/2022: ALT 15; BUN 16; Creatinine, Ser 0.92; Hemoglobin 12.8; Platelets 413; Potassium 3.7; Sodium 132  Recent Lipid Panel    Component Value Date/Time   CHOL 158 09/10/2020 1452   TRIG 100 09/10/2020 1452   HDL 53 09/10/2020 1452   CHOLHDL 3.0 09/10/2020 1452   VLDL 20 09/10/2020 1452   LDLCALC 85 09/10/2020 1452    Physical Exam:    VS:  BP 136/66 (BP Location: Right Arm, Patient Position: Sitting, Cuff  Size: Large)   Pulse (!) 101   Ht 5\' 3"  (1.6 m)   Wt 189 lb (85.7 kg)   SpO2 98%   BMI 33.48 kg/m     Wt Readings from Last 3 Encounters:  09/10/22 189 lb (85.7 kg)  08/25/22 197 lb 8 oz (89.6 kg)  06/13/22 180 lb (81.6 kg)     GEN:  Well nourished, well developed in no acute distress HEENT: Normal NECK: No JVD; No carotid bruits LYMPHATICS: No lymphadenopathy CARDIAC: RRR, no murmurs, rubs, gallops RESPIRATORY:  Clear to auscultation without rales, wheezing or rhonchi  ABDOMEN: Soft, non-tender, non-distended MUSCULOSKELETAL:  No edema; No deformity  SKIN: Warm and dry NEUROLOGIC:  Alert and oriented x 3 PSYCHIATRIC:  Normal affect   ASSESSMENT:    1. Abdominal pain, unspecified abdominal location   2. Essential hypertension   3. Coronary artery disease involving native coronary artery of native heart, unspecified whether angina present    PLAN:    In order of problems listed above:  Abdominal pain Patient reports flank and abdominal pain for the last 3 days with associated chills. Apeptite is decreased as well. No diarrhea or constipation. Abdomen is tender on palpation. Suspect gastritis, which may be why heart rate is 101bpm. Recommended she  see PCP if symptoms do not improve.   HTN We suspect patient is taking Enalapril 20mg  BID, HCTZ 12.5mg  daily and Imdur 15mg  daily. We will refill Imdur. She will go home and confirm medications.   H/o chest pain Nonobstructive CAD Seen in January 2023 for chest pain and outpatient myoview lexiscan was recommended, but she was not seen back in follow-up. We will see her back in a month to order stress test. Cardiac cath in 09/2020 showed nonobstructive CAD. Continue Aspirin, Simvastatin 10mg  daily, Imdur 15mg  daily.   Disposition: Follow up in 1 month(s) with APP    Signed, Odus Clasby Ninfa Meeker, PA-C  09/10/2022 1:18 PM    [facility]

## 2022-09-10 NOTE — Patient Instructions (Signed)
Medication Instructions:  Your physician recommends that you continue on your current medications as directed. Please refer to the Current Medication list given to you today.  *If you need a refill on your cardiac medications before your next appointment, please call your pharmacy*   Lab Work: NONE ordered at this time of appointment   If you have labs (blood work) drawn today and your tests are completely normal, you will receive your results only by: [place] (if you have MyChart) OR A paper copy in the mail If you have any lab test that is abnormal or we need to change your treatment, we will call you to review the results.   Testing/Procedures: NONE ordered at this time of appointment     Follow-Up: At [facility], you and your health needs are our priority.  As part of our continuing mission to provide you with exceptional heart care, we have created designated Provider Care Teams.  These Care Teams include your primary Cardiologist (physician) and Advanced Practice Providers (APPs -  Physician Assistants and Nurse Practitioners) who all work together to provide you with the care you need, when you need it.  We recommend signing up for the patient portal called "MyChart".  Sign up information is provided on this After Visit Summary.  MyChart is used to connect with patients for Virtual Visits (Telemedicine).  Patients are able to view lab/test results, encounter notes, upcoming appointments, etc.  Non-urgent messages can be sent to your provider as well.   To learn more about what you can do with MyChart, go to [email or web address].    Your next appointment:   1 month(s)  The format for your next appointment:   In Person  Provider:   Cadence Kathlen Mody, PA-C    Important Information About Sugar

## 2022-10-14 ENCOUNTER — Encounter: Payer: Self-pay | Admitting: Medical

## 2022-10-14 ENCOUNTER — Ambulatory Visit: Payer: Medicare Other | Attending: Medical | Admitting: Medical

## 2022-10-14 VITALS — BP 149/83 | HR 86 | Ht 63.0 in | Wt 189.4 lb

## 2022-10-14 DIAGNOSIS — I10 Essential (primary) hypertension: Secondary | ICD-10-CM | POA: Insufficient documentation

## 2022-10-14 DIAGNOSIS — R079 Chest pain, unspecified: Secondary | ICD-10-CM | POA: Insufficient documentation

## 2022-10-14 DIAGNOSIS — R109 Unspecified abdominal pain: Secondary | ICD-10-CM | POA: Insufficient documentation

## 2022-10-14 DIAGNOSIS — R0609 Other forms of dyspnea: Secondary | ICD-10-CM | POA: Diagnosis not present

## 2022-10-14 MED ORDER — ISOSORBIDE MONONITRATE ER 30 MG PO TB24: 30.0000 mg | ORAL_TABLET | Freq: Every day | ORAL | 1 refills | Status: DC

## 2022-10-14 NOTE — Progress Notes (Signed)
Cardiology Office Note:    Date:  10/14/2022   ID:  Linda Brady, DOB [birth year], MRN [id number]  PCP:  [place] Pink, MD  [facility] Cardiologist:  Kate Sable, MD  [facility] Electrophysiologist:  None   Referring MD: [place] Pink, MD   Chief Complaint: 1 month follow-up  History of Present Illness:    Linda Brady is a 81 y.o. female with a hx of CAD, HTN, COPD who presents for 1 month follow-up   Patient has a history of CAD with 50% proximal RCA lesion and 30% LAD lesion.   She was seen in January 2023 for chest pain during an admission.  Troponins were minimally elevated, representing demand supply mismatch.  Symptoms improved with inhalers.  Echo showed preserved LVEF.  She was started on Imdur.  Plan was for outpatient stress testing.   Seen in the ER 08/25/2022 for chest pain.  Blood pressure was elevated 170s over 80s.  Troponin was negative x2.  Last seen 09/10/22 reporting flank and abdomen, suspected gastritis. She did reports some chest discomfort, however no changes were made in the setting of GI issues. It was recommended she see PCP.  Today, the patient reports she is still having abdominal pain. Says she has good and bad days. She has no overt chest pain. She has dyspnea on exertion and occasional chest discomfort. She takes liquid antacid. She has diffuse abdominal pain and flank pain. She denies fever, no chills. She is eating ok, trying to make diet changes. She had her gallbladder out in the past. She saw PCP, who didn't change anything. She may need to see GI. She denies dark or bloody stool. No vomiting, diarrhea or constipation. She has some nausea at times.   Past Medical History:  Diagnosis Date   COPD (chronic obstructive pulmonary disease) ([place])    Hypertension    Thyroid disease     Past Surgical History:  Procedure Laterality Date   BREAST CYST ASPIRATION Left 10 plus yrs   benign-twice   LEFT HEART CATH AND CORONARY  ANGIOGRAPHY N/A 09/11/2020   Procedure: LEFT HEART CATH AND CORONARY ANGIOGRAPHY;  Surgeon: Isaias Cowman, MD;  Location: [facility];  Service: Cardiovascular;  Laterality: N/A;    Current Medications: Current Meds  Medication Sig   albuterol (VENTOLIN HFA) 108 (90 Base) MCG/ACT inhaler Inhale 2 puffs into the lungs every 4 (four) hours as needed for wheezing or shortness of breath.   aspirin 81 MG EC tablet Take 81 mg by mouth daily.   cholecalciferol (VITAMIN D3) 25 MCG (1000 UNIT) tablet Take 1,000 Units by mouth daily.   enalapril (VASOTEC) 20 MG tablet Take 1 tablet (20 mg total) by mouth 2 (two) times daily.   FLUoxetine (PROZAC) 20 MG capsule Take 20 mg by mouth daily.   hydrochlorothiazide (HYDRODIURIL) 25 MG tablet Take 0.5 tablets (12.5 mg total) by mouth daily.   hydrOXYzine (ATARAX/VISTARIL) 25 MG tablet Take 25 mg by mouth 3 (three) times daily as needed for anxiety.   Ipratropium-Albuterol (COMBIVENT) 20-100 MCG/ACT AERS respimat Inhale 1 puff into the lungs 4 (four) times daily.   levothyroxine (SYNTHROID) 75 MCG tablet Take 75 mcg by mouth daily.   RABEprazole (ACIPHEX) 20 MG tablet Take 1 tablet (20 mg) by mouth once daily   simvastatin (ZOCOR) 20 MG tablet Take 10 mg by mouth daily.   vitamin B-12 (CYANOCOBALAMIN) 1000 MCG tablet Take 1,000 mcg by mouth daily.   [DISCONTINUED] isosorbide mononitrate (IMDUR) 30  MG 24 hr tablet Take 0.5 tablets (15 mg total) by mouth daily.     Allergies:   Naproxen sodium   Social History   Socioeconomic History   Marital status: Widowed    Spouse name: Not on file   Number of children: Not on file   Years of education: Not on file   Highest education level: Not on file  Occupational History   Not on file  Tobacco Use   Smoking status: Former    Packs/day: 0.50    Years: 12.00    Total pack years: 6.00    Types: Cigarettes   Smokeless tobacco: Never   Tobacco comments:    7-8 cigarettes a day  Vaping Use    Vaping Use: Never used  Substance and Sexual Activity   Alcohol use: Not Currently   Drug use: Not Currently   Sexual activity: Not Currently  Other Topics Concern   Not on file  Social History Narrative   Not on file   Social Determinants of Health   Financial Resource Strain: Not on file  Food Insecurity: Not on file  Transportation Needs: Not on file  Physical Activity: Not on file  Stress: Not on file  Social Connections: Not on file     Family History: The patient's family history is negative for Breast cancer.  ROS:   Please see the history of present illness.     All other systems reviewed and are negative.  EKGs/Labs/Other Studies Reviewed:    The following studies were reviewed today:  Echo 12/2021  1. Left ventricular ejection fraction, by estimation, is 65 to 70%. The  left ventricle has normal function. The left ventricle has no regional  wall motion abnormalities. Left ventricular diastolic parameters are  consistent with Grade II diastolic  dysfunction (pseudonormalization).   2. Right ventricular systolic function is normal. The right ventricular  size is normal.   3. The mitral valve is degenerative. No evidence of mitral valve  regurgitation.   4. Aortic valve DVI=0.38, Vmax 71m/s, mean gradient 74mmHg, PG 33mmHg AVA  1.2cm2.. The aortic valve is calcified. Aortic valve regurgitation is  mild. Moderate aortic valve stenosis.   5. The inferior vena cava is normal in size with greater than 50%  respiratory variability, suggesting right atrial pressure of 3 mmHg.   Cardiac cath 09/2020 Ost RCA to Prox RCA lesion is 50% stenosed. Prox LAD lesion is 30% stenosed.   1.  Insignificant coronary artery disease with 50% stenosis ostium RCA 2.  Normal left ventricular function   Recommendations   1.  Continue medical therapy 2.  Aggressive risk factor modification 3.  May discharge home 4.  Follow-up with Dr. Ubaldo Glassing in 1 week      EKG:  EKG is not  ordered today.   Recent Labs: 12/16/2021: TSH 1.547 04/14/2022: Magnesium 2.2 05/16/2022: B Natriuretic Peptide 139.5 08/25/2022: ALT 15; BUN 16; Creatinine, Ser 0.92; Hemoglobin 12.8; Platelets 413; Potassium 3.7; Sodium 132  Recent Lipid Panel    Component Value Date/Time   CHOL 158 09/10/2020 1452   TRIG 100 09/10/2020 1452   HDL 53 09/10/2020 1452   CHOLHDL 3.0 09/10/2020 1452   VLDL 20 09/10/2020 1452   LDLCALC 85 09/10/2020 1452    Physical Exam:    VS:  BP (!) 149/83 (BP Location: Right Arm, Patient Position: Sitting, Cuff Size: Large)   Pulse 86   Ht 5\' 3"  (1.6 m)   Wt 189 lb 6.4 oz (85.9 kg)  SpO2 97%   BMI 33.55 kg/m     Wt Readings from Last 3 Encounters:  10/14/22 189 lb 6.4 oz (85.9 kg)  09/10/22 189 lb (85.7 kg)  08/25/22 197 lb 8 oz (89.6 kg)     GEN:  Well nourished, well developed in no acute distress HEENT: Normal NECK: No JVD; No carotid bruits LYMPHATICS: No lymphadenopathy CARDIAC: RRR, no murmurs, rubs, gallops RESPIRATORY:  Clear to auscultation without rales, wheezing or rhonchi  ABDOMEN: Soft, non-tender, non-distended MUSCULOSKELETAL:  No edema; No deformity  SKIN: Warm and dry NEUROLOGIC:  Alert and oriented x 3 PSYCHIATRIC:  Normal affect   ASSESSMENT:    1. Chest pain, unspecified type   2. Dyspnea on exertion   3. Essential hypertension   4. Abdominal pain, unspecified abdominal location    PLAN:    In order of problems listed above:  DOE/chest discomfort Nonobstructive CAD She reports worsening DOE and occasional chest discomfort in the setting of persistent abdominal and flank pain. LHC in 2021 showed nonobstructive disease. We previously discussed Myoview lexiscan, however wanted to wait until abdominal pain had resolved, especially given lexiscan side effects. Hopefully, the patient can see PCP or GI int he near future. I will order a Myoview lexiscan to evaluate for possible ischemia. Low suspicion this I cardiac pain. I will  increase Imdur to 30mg  daily.   Abdominal pain Patient has persistent abdominal pain that is unchanged. She saw PCP who made no changes. She may need to go see GI, she said she will talk to her PCP. Unclear etiology at this point, it has been over a month.  HTN BP mildly elevated today. I will increase Imdur to 30mg  daily.   Disposition: Follow up in 2 month(s) with MD/APP    Signed, Keelan Tripodi , PA-C  10/14/2022 3:08 PM    [facility]

## 2022-10-14 NOTE — Patient Instructions (Signed)
Medication Instructions:  INCREASE isosorbide to 30 mg by mouth daily.  *If you need a refill on your cardiac medications before your next appointment, please call your pharmacy*   Lab Work: None needed  If you have labs (blood work) drawn today and your tests are completely normal, you will receive your results only by: MyChart Message (if you have MyChart) OR A paper copy in the mail If you have any lab test that is abnormal or we need to change your treatment, we will call you to review the results.   Testing/Procedures: [facility] MYOVIEW  Your caregiver has ordered a Stress Test with nuclear imaging. The purpose of this test is to evaluate the blood supply to your heart muscle. This procedure is referred to as a "Non-Invasive Stress Test." This is because other than having an IV started in your vein, nothing is inserted or "invades" your body. Cardiac stress tests are done to find areas of poor blood flow to the heart by determining the extent of coronary artery disease (CAD). Some patients exercise on a treadmill, which naturally increases the blood flow to your heart, while others who are  unable to walk on a treadmill due to physical limitations have a pharmacologic/chemical stress agent called Lexiscan . This medicine will mimic walking on a treadmill by temporarily increasing your coronary blood flow.   Please note: these test may take anywhere between 2-4 hours to complete  PLEASE REPORT TO [facility] MEDICAL MALL ENTRANCE  THE VOLUNTEERS AT THE FIRST DESK WILL DIRECT YOU WHERE TO GO  Date of Procedure:_____________________________________  Arrival Time for Procedure:______________________________  Instructions regarding medication:   _X___:  Hold HCTZ until after procedure  PLEASE NOTIFY THE OFFICE AT LEAST 24 HOURS IN ADVANCE IF YOU ARE UNABLE TO KEEP YOUR APPOINTMENT.  [phone] AND  PLEASE NOTIFY NUCLEAR MEDICINE AT [facility] AT LEAST 24 HOURS IN ADVANCE IF YOU ARE UNABLE TO KEEP  YOUR APPOINTMENT. [phone]  How to prepare for your Myoview test:  Do not eat or drink after midnight No caffeine for 24 hours prior to test No smoking 24 hours prior to test. Your medication may be taken with water.  If your doctor stopped a medication because of this test, do not take that medication. Ladies, please do not wear dresses.  Skirts or pants are appropriate. Please wear a short sleeve shirt. No perfume, cologne or lotion. Wear comfortable walking shoes. No heels!    Follow-Up: At [facility], you and your health needs are our priority.  As part of our continuing mission to provide you with exceptional heart care, we have created designated Provider Care Teams.  These Care Teams include your primary Cardiologist (physician) and Advanced Practice Providers (APPs -  Physician Assistants and Nurse Practitioners) who all work together to provide you with the care you need, when you need it.  We recommend signing up for the patient portal called "MyChart".  Sign up information is provided on this After Visit Summary.  MyChart is used to connect with patients for Virtual Visits (Telemedicine).  Patients are able to view lab/test results, encounter notes, upcoming appointments, etc.  Non-urgent messages can be sent to your provider as well.   To learn more about what you can do with MyChart, go to [email or web address].    Your next appointment:    After testing  The format for your next appointment:   In Person  Provider:   You may see Linda Odea, MD or one of the following  Advanced Practice Providers on your designated Care Team:   Linda Ducking, NP Linda Listen, PA-C Linda Brady, [place] Linda Quest, NP   Important Information About Sugar

## 2022-10-21 ENCOUNTER — Encounter
Admission: RE | Admit: 2022-10-21 | Discharge: 2022-10-21 | Disposition: A | Discharge status: Home / Self Care | Payer: Medicare Other | Source: Ambulatory Visit | Attending: Medical | Admitting: Medical

## 2022-10-21 DIAGNOSIS — R0609 Other forms of dyspnea: Secondary | ICD-10-CM | POA: Diagnosis present

## 2022-10-21 DIAGNOSIS — R079 Chest pain, unspecified: Secondary | ICD-10-CM

## 2022-10-21 LAB — NM MYOCAR MULTI W/SPECT W/WALL MOTION / EF
Estimated workload: 1
Exercise duration (min): 0 min
Exercise duration (sec): 0 s
LV dias vol: 65 mL (ref 46–106)
LV sys vol: 21 mL
MPHR: 141 {beats}/min
Nuc Stress EF: 68 %
Peak HR: 105 {beats}/min
Percent HR: 74 %
Rest HR: 81 {beats}/min
Rest Nuclear Isotope Dose: 10.3 mCi
SDS: 0
SRS: 0
SSS: 0
ST Depression (mm): 0 mm
Stress Nuclear Isotope Dose: 30.9 mCi
TID: 1.03

## 2022-10-21 MED ORDER — TECHNETIUM TC 99M TETROFOSMIN IV KIT
10.2500 | PACK | Freq: Once | INTRAVENOUS | Status: AC | PRN
  Administered 2022-10-21: 10.25 via INTRAVENOUS

## 2022-10-21 MED ORDER — REGADENOSON 0.4 MG/5ML IV SOLN
0.4000 mg | Freq: Once | INTRAVENOUS | Status: AC
  Administered 2022-10-21: 0.4 mg via INTRAVENOUS

## 2022-10-21 MED ORDER — TECHNETIUM TC 99M TETROFOSMIN IV KIT
30.8600 | PACK | Freq: Once | INTRAVENOUS | Status: AC | PRN
  Administered 2022-10-21: 30.86 via INTRAVENOUS

## 2022-11-04 ENCOUNTER — Telehealth: Payer: Self-pay | Admitting: Medical

## 2022-11-04 ENCOUNTER — Encounter: Payer: Self-pay | Admitting: Medical

## 2022-11-04 ENCOUNTER — Ambulatory Visit: Payer: Medicare Other | Attending: Medical | Admitting: Medical

## 2022-11-04 ENCOUNTER — Telehealth: Payer: Self-pay | Admitting: Cardiology

## 2022-11-04 VITALS — Ht 63.0 in | Wt 189.0 lb

## 2022-11-04 DIAGNOSIS — F419 Anxiety disorder, unspecified: Secondary | ICD-10-CM

## 2022-11-04 DIAGNOSIS — R109 Unspecified abdominal pain: Secondary | ICD-10-CM

## 2022-11-04 DIAGNOSIS — I2510 Atherosclerotic heart disease of native coronary artery without angina pectoris: Secondary | ICD-10-CM | POA: Diagnosis present

## 2022-11-04 DIAGNOSIS — I10 Essential (primary) hypertension: Secondary | ICD-10-CM

## 2022-11-04 NOTE — Telephone Encounter (Signed)
-----   Message from Sandi Mariscal, RN sent at 11/04/2022  1:40 PM EST ----- Patient needs a 3 month with Dr. Azucena Cecil or APP-thank you

## 2022-11-04 NOTE — Telephone Encounter (Signed)
  Patient Consent for Virtual Visit        Linda Brady has provided verbal consent on 11/04/2022 for a virtual visit (video or telephone).   CONSENT FOR VIRTUAL VISIT FOR:  Linda Brady  By participating in this virtual visit I agree to the following:  I hereby voluntarily request, consent and authorize [facility] and its employed or contracted physicians, physician assistants, nurse practitioners or other licensed health care professionals (the Practitioner), to provide me with telemedicine health care services (the "Services") as deemed necessary by the treating Practitioner. I acknowledge and consent to receive the Services by the Practitioner via telemedicine. I understand that the telemedicine visit will involve communicating with the Practitioner through live audiovisual communication technology and the disclosure of certain medical information by electronic transmission. I acknowledge that I have been given the opportunity to request an in-person assessment or other available alternative prior to the telemedicine visit and am voluntarily participating in the telemedicine visit.  I understand that I have the right to withhold or withdraw my consent to the use of telemedicine in the course of my care at any time, without affecting my right to future care or treatment, and that the Practitioner or I may terminate the telemedicine visit at any time. I understand that I have the right to inspect all information obtained and/or recorded in the course of the telemedicine visit and may receive copies of available information for a reasonable fee.  I understand that some of the potential risks of receiving the Services via telemedicine include:  Delay or interruption in medical evaluation due to technological equipment failure or disruption; Information transmitted may not be sufficient (e.g. poor resolution of images) to allow for appropriate medical decision making by the  Practitioner; and/or  In rare instances, security protocols could fail, causing a breach of personal health information.  Furthermore, I acknowledge that it is my responsibility to provide information about my medical history, conditions and care that is complete and accurate to the best of my ability. I acknowledge that Practitioner's advice, recommendations, and/or decision may be based on factors not within their control, such as incomplete or inaccurate data provided by me or distortions of diagnostic images or specimens that may result from electronic transmissions. I understand that the practice of medicine is not an exact science and that Practitioner makes no warranties or guarantees regarding treatment outcomes. I acknowledge that a copy of this consent can be made available to me via my patient portal [facility] MyChart), or I can request a printed copy by calling the office of [facility].    I understand that my insurance will be billed for this visit.   I have read or had this consent read to me. I understand the contents of this consent, which adequately explains the benefits and risks of the Services being provided via telemedicine.  I have been provided ample opportunity to ask questions regarding this consent and the Services and have had my questions answered to my satisfaction. I give my informed consent for the services to be provided through the use of telemedicine in my medical care

## 2022-11-04 NOTE — Telephone Encounter (Signed)
Called to schedule  No answer no VM

## 2022-11-04 NOTE — Telephone Encounter (Signed)
  Pt said, she has dizzy spell and can't get ready to come in to her appt. She wanted to ask of her appt can be changed to televisit

## 2022-11-04 NOTE — Telephone Encounter (Signed)
Pt called requesting if visit for today can be switched to telephone visit. Pt stated when she's up walking she feels dizzy and weak. No vitals to report as she report her BP machine is broken. She also report SOB with exertion and right lower extremity edema, but report neither is new.   Pt report she is currently feeling better since resting in bed, but report she can't make it to appointment today.  Will forward to PA for recommendations.

## 2022-11-04 NOTE — Progress Notes (Signed)
Virtual Visit via Telephone Note   Because of Linda Brady's co-morbid illnesses, she is at least at moderate risk for complications without adequate follow up.  This format is felt to be most appropriate for this patient at this time.  The patient did not have access to video technology/had technical difficulties with video requiring transitioning to audio format only (telephone).  All issues noted in this document were discussed and addressed.  No physical exam could be performed with this format.  Please refer to the patient's chart for her consent to telehealth for [facility].    Date:  11/04/2022   ID:  Linda Brady, DOB [birth year], MRN [id number] The patient was identified using 2 identifiers.  Patient Location: Home Provider Location: Office/Clinic   PCP:  [name], MD   [facility] Providers Cardiologist:  Debbe Odea, MD {    Evaluation Performed:  Follow-Up Visit  Chief Complaint:  Stress test follow-up  History of Present Illness:    Linda Brady is a 81 y.o. female with a h/o nonobstructive CAD, HTN, anxiety, COPD who presents for 1 month follow-up   Patient has a history of CAD with 50% proximal RCA lesion and 30% LAD lesion.   She was seen in January 2023 for chest pain during an admission.  Troponins were minimally elevated, representing demand supply mismatch.  Symptoms improved with inhalers.  Echo showed preserved LVEF.  She was started on Imdur.  Plan was for outpatient stress testing.   Seen in the ER 08/25/2022 for chest pain.  Blood pressure was elevated 170s over 80s.  Troponin was negative x2.   She was seen 09/10/22 reporting flank and abdomen, suspected gastritis. She did reports some chest discomfort, however no changes were made in the setting of GI issues. It was recommended she see PCP.  Last seen 10/14/22 and reported she was still having abdominal pain, however no overt chest pain. A Myoview  lexiscan was ordered and Imdur was increased. Myoview Lexiscan showed normal LV perfusion, no ischemia, overall low risk, with mild coronary calcifications.  Today, the patient reports she was having a lot of anxiety this morning which is why she wanted to change the appointment to a telephone visit. Anxiety occurs when she feels she can't breath. She may also feel dizzy. She has O2 to use as needed. She used it this morning at 2L O2 for about 10 minutes. Breathing is better than it was this morning. She is taking fluoxetine, this was recently increased and PCP and Xanax was added to use as needed. She denies chest pain. She reports unchanged lower leg edema, R>L.  She does not have a way to tale her vitals. Myoview lexiscan was reviewed. She is taking increased Imdur dose. She denies pre-syncope. She is still having intermittent GI issues. She did eat and drink this morning.   Past Medical History:  Diagnosis Date   COPD (chronic obstructive pulmonary disease) (HCC)    Hypertension    Thyroid disease    Past Surgical History:  Procedure Laterality Date   BREAST CYST ASPIRATION Left 10 plus yrs   benign-twice   LEFT HEART CATH AND CORONARY ANGIOGRAPHY N/A 09/11/2020   Procedure: LEFT HEART CATH AND CORONARY ANGIOGRAPHY;  Surgeon: Marcina Millard, MD;  Location: [facility] INVASIVE CV LAB;  Service: Cardiovascular;  Laterality: N/A;     Current Meds  Medication Sig   albuterol (VENTOLIN HFA) 108 (90 Base) MCG/ACT inhaler Inhale 2 puffs  into the lungs every 4 (four) hours as needed for wheezing or shortness of breath.   aspirin 81 MG EC tablet Take 81 mg by mouth daily.   cholecalciferol (VITAMIN D3) 25 MCG (1000 UNIT) tablet Take 1,000 Units by mouth daily.   enalapril (VASOTEC) 20 MG tablet Take 1 tablet (20 mg total) by mouth 2 (two) times daily.   FLUoxetine (PROZAC) 20 MG capsule Take 20 mg by mouth daily.   hydrochlorothiazide (HYDRODIURIL) 25 MG tablet Take 0.5 tablets (12.5 mg total) by  mouth daily.   hydrOXYzine (ATARAX/VISTARIL) 25 MG tablet Take 25 mg by mouth 3 (three) times daily as needed for anxiety.   Ipratropium-Albuterol (COMBIVENT) 20-100 MCG/ACT AERS respimat Inhale 1 puff into the lungs 4 (four) times daily.   isosorbide mononitrate (IMDUR) 30 MG 24 hr tablet Take 1 tablet (30 mg total) by mouth daily.   levothyroxine (SYNTHROID) 75 MCG tablet Take 75 mcg by mouth daily.   RABEprazole (ACIPHEX) 20 MG tablet Take 1 tablet (20 mg) by mouth once daily   simvastatin (ZOCOR) 20 MG tablet Take 10 mg by mouth daily.   vitamin B-12 (CYANOCOBALAMIN) 1000 MCG tablet Take 1,000 mcg by mouth daily.     Allergies:   Naproxen sodium   Social History   Tobacco Use   Smoking status: Former    Packs/day: 0.50    Years: 12.00    Total pack years: 6.00    Types: Cigarettes   Smokeless tobacco: Never   Tobacco comments:    7-8 cigarettes a day  Vaping Use   Vaping Use: Never used  Substance Use Topics   Alcohol use: Not Currently   Drug use: Not Currently     Family Hx: The patient's family history is negative for Breast cancer.  ROS:   Please see the history of present illness.    All other systems reviewed and are negative.   Prior CV studies:   The following studies were reviewed today:  Myoview Lexiscan 10/21/22 Narrative & Impression      The study is normal. The study is low risk.   No ST deviation was noted.   LV perfusion is normal. There is no evidence of ischemia. There is no evidence of infarction.   Left ventricular function is normal. End diastolic cavity size is normal. End systolic cavity size is normal.   CT continuation images showed moderate aortic calcifications and mild coronary calcifications.      Echo 12/15/21 1. Left ventricular ejection fraction, by estimation, is 65 to 70%. The  left ventricle has normal function. The left ventricle has no regional  wall motion abnormalities. Left ventricular diastolic parameters are   consistent with Grade II diastolic  dysfunction (pseudonormalization).   2. Right ventricular systolic function is normal. The right ventricular  size is normal.   3. The mitral valve is degenerative. No evidence of mitral valve  regurgitation.   4. Aortic valve DVI=0.38, Vmax 40m/s, mean gradient 64mmHg, PG 48mmHg AVA  1.2cm2.. The aortic valve is calcified. Aortic valve regurgitation is  mild. Moderate aortic valve stenosis.   5. The inferior vena cava is normal in size with greater than 50%  respiratory variability, suggesting right atrial pressure of 3 mmHg.   Cardiac cath 09/2020  Ost RCA to Prox RCA lesion is 50% stenosed. Prox LAD lesion is 30% stenosed.   1.  Insignificant coronary artery disease with 50% stenosis ostium RCA 2.  Normal left ventricular function   Recommendations  1.  Continue medical therapy 2.  Aggressive risk factor modification 3.  May discharge home 4.  Follow-up with Dr. Ubaldo Glassing in 1 week    Labs/Other Tests and Data Reviewed:    EKG:  No ECG reviewed.  Recent Labs: 12/16/2021: TSH 1.547 04/14/2022: Magnesium 2.2 05/16/2022: B Natriuretic Peptide 139.5 08/25/2022: ALT 15; BUN 16; Creatinine, Ser 0.92; Hemoglobin 12.8; Platelets 413; Potassium 3.7; Sodium 132   Recent Lipid Panel Lab Results  Component Value Date/Time   CHOL 158 09/10/2020 02:52 PM   TRIG 100 09/10/2020 02:52 PM   HDL 53 09/10/2020 02:52 PM   CHOLHDL 3.0 09/10/2020 02:52 PM   LDLCALC 85 09/10/2020 02:52 PM    Wt Readings from Last 3 Encounters:  11/04/22 189 lb (85.7 kg)  10/14/22 189 lb 6.4 oz (85.9 kg)  09/10/22 189 lb (85.7 kg)     Objective:    Vital Signs:  Ht 5\' 3"  (1.6 m)   Wt 189 lb (85.7 kg)   BMI 33.48 kg/m    No vitals available for review   ASSESSMENT:    1. Coronary artery disease involving native coronary artery of native heart without angina pectoris   2. Abdominal pain, unspecified abdominal location   3. Essential hypertension   4. Anxiety      ASSESSMENT & PLAN:    Nonobstructive CAD Recent Myoview Lexiscan for chest discomfort was low risk with no evidence of ischemia, mild coronary calcifications. The patient denies further chest pain and DOE.  Also, LHC in 2021 showed nonobstructive disease. She is taking Imdur 30mg  daily. Continue Aspirin, statin and Imdur. No further ischemic work-up indicated at this time.   Abdominal Pain She is still having abdominal pain that is unchanged. It is intermittent. No nausea, vomiting, diarrhea, or constipation. She is eating and drinking normally. She has follow-up with PCP later this month.   HTN Imdur was increased at the last visit from 15 to 30mg  with mildly elevated BP and chest pain. No vitals are available today. I recommended the patient check BP at home and record them for at least a week. Continue Imdur 30 mg daily, HCTZ 12.5mg  daily, and Enalapril 20mg  daily.   Anxiety Patient felt very anxious this morning, which is why she wanted to have a telephone visit. She is on Prozac, she has a follow-up with PCP later this month. Last month, her PCP increased Prozac and gave her Xanax to use as needed. She may go back to taking hydroxyzine as she was taking this before. She has appointment with PCP to discuss this.   Time:   Today, I have spent 20 minutes with the patient with telehealth technology discussing the above problems.     Medication Adjustments/Labs and Tests Ordered: Current medicines are reviewed at length with the patient today.  Concerns regarding medicines are outlined above.   Tests Ordered: No orders of the defined types were placed in this encounter.   Medication Changes: No orders of the defined types were placed in this encounter.   Follow Up:  In Person in 3 month(s)  Signed, Clarrisa Kaylor Ninfa Meeker, PA-C  11/04/2022 11:27 AM    [place]

## 2022-11-04 NOTE — Patient Instructions (Addendum)
Medication Instructions:  No changes *If you need a refill on your cardiac medications before your next appointment, please call your pharmacy*   Lab Work: None ordered If you have labs (blood work) drawn today and your tests are completely normal, you will receive your results only by: MyChart Message (if you have MyChart) OR A paper copy in the mail If you have any lab test that is abnormal or we need to change your treatment, we will call you to review the results.   Testing/Procedures: None ordered   Follow-Up: At [facility], you and your health needs are our priority.  As part of our continuing mission to provide you with exceptional heart care, we have created designated Provider Care Teams.  These Care Teams include your primary Cardiologist (physician) and Advanced Practice Providers (APPs -  Physician Assistants and Nurse Practitioners) who all work together to provide you with the care you need, when you need it.  We recommend signing up for the patient portal called "MyChart".  Sign up information is provided on this After Visit Summary.  MyChart is used to connect with patients for Virtual Visits (Telemedicine).  Patients are able to view lab/test results, encounter notes, upcoming appointments, etc.  Non-urgent messages can be sent to your provider as well.   To learn more about what you can do with MyChart, go to [email or web address].    Your next appointment:   3 month(s)  The format for your next appointment:   In Person  Provider:   You may see Debbe Odea, MD or one of the following Advanced Practice Providers on your designated Care Team:   Nicolasa Ducking, NP Eula Listen, PA-C Cadence Fransico Michael, PA-C Charlsie Quest, NP

## 2022-11-15 NOTE — Telephone Encounter (Signed)
Called to schedule follow up No VM  

## 2022-12-05 ENCOUNTER — Emergency Department: Payer: Medicare Other

## 2022-12-05 ENCOUNTER — Emergency Department
Admission: EM | Admit: 2022-12-05 | Discharge: 2022-12-05 | Disposition: A | Discharge status: Home / Self Care | Payer: Medicare Other | Attending: Emergency Medicine | Admitting: Emergency Medicine

## 2022-12-05 ENCOUNTER — Encounter: Payer: Self-pay | Admitting: Emergency Medicine

## 2022-12-05 ENCOUNTER — Other Ambulatory Visit: Payer: Self-pay

## 2022-12-05 DIAGNOSIS — Z8616 Personal history of COVID-19: Secondary | ICD-10-CM | POA: Insufficient documentation

## 2022-12-05 DIAGNOSIS — I2510 Atherosclerotic heart disease of native coronary artery without angina pectoris: Secondary | ICD-10-CM | POA: Diagnosis not present

## 2022-12-05 DIAGNOSIS — R0600 Dyspnea, unspecified: Secondary | ICD-10-CM | POA: Insufficient documentation

## 2022-12-05 DIAGNOSIS — R1013 Epigastric pain: Secondary | ICD-10-CM

## 2022-12-05 DIAGNOSIS — F419 Anxiety disorder, unspecified: Secondary | ICD-10-CM | POA: Diagnosis present

## 2022-12-05 DIAGNOSIS — I350 Nonrheumatic aortic (valve) stenosis: Secondary | ICD-10-CM

## 2022-12-05 DIAGNOSIS — Z7951 Long term (current) use of inhaled steroids: Secondary | ICD-10-CM | POA: Diagnosis not present

## 2022-12-05 DIAGNOSIS — Z1152 Encounter for screening for COVID-19: Secondary | ICD-10-CM | POA: Diagnosis not present

## 2022-12-05 DIAGNOSIS — R7989 Other specified abnormal findings of blood chemistry: Secondary | ICD-10-CM | POA: Diagnosis not present

## 2022-12-05 DIAGNOSIS — I110 Hypertensive heart disease with heart failure: Secondary | ICD-10-CM | POA: Insufficient documentation

## 2022-12-05 DIAGNOSIS — R0602 Shortness of breath: Secondary | ICD-10-CM

## 2022-12-05 DIAGNOSIS — J441 Chronic obstructive pulmonary disease with (acute) exacerbation: Secondary | ICD-10-CM | POA: Diagnosis not present

## 2022-12-05 DIAGNOSIS — R778 Other specified abnormalities of plasma proteins: Secondary | ICD-10-CM | POA: Insufficient documentation

## 2022-12-05 DIAGNOSIS — I5032 Chronic diastolic (congestive) heart failure: Secondary | ICD-10-CM | POA: Diagnosis present

## 2022-12-05 DIAGNOSIS — E039 Hypothyroidism, unspecified: Secondary | ICD-10-CM | POA: Insufficient documentation

## 2022-12-05 DIAGNOSIS — E871 Hypo-osmolality and hyponatremia: Secondary | ICD-10-CM | POA: Diagnosis present

## 2022-12-05 DIAGNOSIS — I509 Heart failure, unspecified: Secondary | ICD-10-CM | POA: Insufficient documentation

## 2022-12-05 DIAGNOSIS — R109 Unspecified abdominal pain: Secondary | ICD-10-CM

## 2022-12-05 LAB — BASIC METABOLIC PANEL
Anion gap: 13 (ref 5–15)
BUN: 8 mg/dL (ref 8–23)
CO2: 25 mmol/L (ref 22–32)
Calcium: 9.6 mg/dL (ref 8.9–10.3)
Chloride: 91 mmol/L — ABNORMAL LOW (ref 98–111)
Creatinine, Ser: 0.78 mg/dL (ref 0.44–1.00)
GFR, Estimated: >60 mL/min (ref >60)
Glucose, Bld: 115 mg/dL — ABNORMAL HIGH (ref 70–99)
Potassium: 3.3 mmol/L — ABNORMAL LOW (ref 3.5–5.1)
Sodium: 129 mmol/L — ABNORMAL LOW (ref 135–145)

## 2022-12-05 LAB — CBC WITH DIFFERENTIAL/PLATELET
Abs Immature Granulocytes: 0.01 10*3/uL (ref 0.00–0.07)
Basophils Absolute: 0.1 10*3/uL (ref 0.0–0.1)
Basophils Relative: 1 %
Eosinophils Absolute: 0.1 10*3/uL (ref 0.0–0.5)
Eosinophils Relative: 1 %
HCT: 37.9 % (ref 36.0–46.0)
Hemoglobin: 12.7 g/dL (ref 12.0–15.0)
Immature Granulocytes: 0 %
Lymphocytes Relative: 17 %
Lymphs Abs: 1 10*3/uL (ref 0.7–4.0)
MCH: 29.5 pg (ref 26.0–34.0)
MCHC: 33.5 g/dL (ref 30.0–36.0)
MCV: 87.9 fL (ref 80.0–100.0)
Monocytes Absolute: 0.5 10*3/uL (ref 0.1–1.0)
Monocytes Relative: 9 %
Neutro Abs: 4.1 10*3/uL (ref 1.7–7.7)
Neutrophils Relative %: 72 %
Platelets: 438 10*3/uL — ABNORMAL HIGH (ref 150–400)
RBC: 4.31 MIL/uL (ref 3.87–5.11)
RDW: 13.4 % (ref 11.5–15.5)
WBC: 5.7 10*3/uL (ref 4.0–10.5)
nRBC: 0 % (ref 0.0–0.2)

## 2022-12-05 LAB — RESP PANEL BY RT-PCR (RSV, FLU A&B, COVID)  RVPGX2
Influenza A by PCR: NEGATIVE
Influenza B by PCR: NEGATIVE
Resp Syncytial Virus by PCR: NEGATIVE
SARS Coronavirus 2 by RT PCR: NEGATIVE

## 2022-12-05 LAB — TROPONIN I (HIGH SENSITIVITY)
Troponin I (High Sensitivity): 49 ng/L — ABNORMAL HIGH (ref <18)
Troponin I (High Sensitivity): 55 ng/L — ABNORMAL HIGH (ref <18)

## 2022-12-05 LAB — BRAIN NATRIURETIC PEPTIDE: B Natriuretic Peptide: 89.5 pg/mL (ref 0.0–100.0)

## 2022-12-05 MED ORDER — IPRATROPIUM-ALBUTEROL 0.5-2.5 (3) MG/3ML IN SOLN
3.0000 mL | Freq: Once | RESPIRATORY_TRACT | Status: AC
  Administered 2022-12-05: 3 mL via RESPIRATORY_TRACT
  Filled 2022-12-05: qty 3

## 2022-12-05 MED ORDER — BUSPIRONE HCL 5 MG PO TABS: 2.5000 mg | ORAL_TABLET | Freq: Two times a day (BID) | ORAL | 0 refills | Status: DC

## 2022-12-05 MED ORDER — ALUM & MAG HYDROXIDE-SIMETH 200-200-20 MG/5ML PO SUSP
30.0000 mL | Freq: Once | ORAL | Status: AC
  Administered 2022-12-05: 30 mL via ORAL
  Filled 2022-12-05: qty 30

## 2022-12-05 MED ORDER — IOHEXOL 350 MG/ML SOLN
75.0000 mL | Freq: Once | INTRAVENOUS | Status: AC | PRN
  Administered 2022-12-05: 75 mL via INTRAVENOUS

## 2022-12-05 MED ORDER — BUSPIRONE HCL 5 MG PO TABS
2.5000 mg | ORAL_TABLET | Freq: Once | ORAL | Status: AC
  Administered 2022-12-05: 2.5 mg via ORAL
  Filled 2022-12-05: qty 1

## 2022-12-05 NOTE — Assessment & Plan Note (Signed)
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  I suspect her event overnight was a nocturnal panic attack.  It looks like her PCP did trial Xanax, but it was not reordered.  Patient is uncertain if she took it.  Discussed the fine balance between controlling anxiety but not decreasing her respiratory drive in the setting of advanced COPD.  - Recommend trial of BuSpar 2.5 mg to be taken in the event of a panic attack - Continue home Prozac - Continue following up with PCP; could consider referral to a geriatric psychiatrist

## 2022-12-05 NOTE — Assessment & Plan Note (Addendum)
Patient stated that after she became short of breath and anxious, her stomach began to cramp.  On examination, she does have some epigastric tenderness, potentially in the setting of gastritis versus IBS.  Symptoms improved with GI cocktail.  - Recommend continued outpatient follow-up with PCP - Recommend daily PPI

## 2022-12-05 NOTE — Assessment & Plan Note (Signed)
Low suspicion that this is currently contributing to current presentation in the absence of heart failure symptoms.  Murmur notable on examination.  - Continue following with cardiology

## 2022-12-05 NOTE — ED Provider Notes (Signed)
-----------------------------------------   3:06 PM on 12/05/2022 -----------------------------------------  Blood pressure (!) 160/70, pulse 80, temperature 98 F (36.7 C), temperature source Oral, resp. rate 18, height 5\' 3"  (1.6 m), weight 85.7 kg, SpO2 98 %.  Assuming care from [facility], [place].  In short, Linda Brady is a 82 y.o. female with a chief complaint of Shortness of Breath .  Refer to the original H&P for additional details.  The current plan of care is to discuss with hospitalist consultant regarding admission v/s discharge.  ----------------------------------------- 4:08 PM on 12/05/2022 ----------------------------------------- Patient evaluated by Dr. Charleen Kirks who ordered GI cocktail and BuSpar 2.5mg . On reassessment, patient is feeling better and would like to go home. ER return precautions were discussed and she agrees to return for concerns if unable to see her PCP.    Victorino Dike, FNP 12/05/22 1702    Vanessa [place], MD 12/06/22 [phone]

## 2022-12-05 NOTE — Assessment & Plan Note (Addendum)
On examination, patient is euvolemic with negative BNP.  Patient denies any orthopnea, lower extremity swelling.  Due to this, low suspicion for an that current presentation is in the setting of heart failure exacerbation.

## 2022-12-05 NOTE — Assessment & Plan Note (Signed)
Patient presented with sudden onset shortness of breath in the middle of the night and states she became very anxious due to this and experienced a lower extremity muscle cramp. On examination, she is breathing comfortably with no abnormalities noted.  CTA chest with no evidence of PE and only notable for resolving right lower lobe infiltrate that is improved compared to prior.  Initial concern for PND, however BNP is within normal limits and echo 1 year ago with preserved EF.  Given patient's anxiety has become uncontrolled in the last few months, I suspect this may have been a nocturnal panic attack.  - Encouraged to continue follow-up with PCP - No indication at this time for urgent or emergent cardiac workup - Recommend BuSpar 2.5 mg to be taken in the event of a panic attack.  Would avoid benzos given advanced age and COPD

## 2022-12-05 NOTE — Consult Note (Signed)
Initial Consultation Note   Patient: Linda Brady ZYS:[id number] DOB: 1941-08-03 PCP: Linda Mina, MD DOA: 12/05/2022 DOS: the patient was seen and examined on 12/05/2022 Primary service: Linda Hacking, MD  Referring physician: Alvy Beal, PA Reason for consult: Nausea, shortness of breath and abdominal pain  Assessment/Plan: Assessment and Plan:  Shortness of breath Patient presented with sudden onset shortness of breath in the middle of the night and states she became very anxious due to this and experienced a lower extremity muscle cramp. On examination, she is breathing comfortably with no abnormalities noted.  CTA chest with no evidence of PE and only notable for resolving right lower lobe infiltrate that is improved compared to prior.  Initial concern for PND, however BNP is within normal limits and echo 1 year ago with preserved EF.  Given patient's anxiety has become uncontrolled in the last few months, I suspect this may have been a nocturnal panic attack.  - Encouraged to continue follow-up with PCP - No indication at this time for urgent or emergent cardiac workup - Recommend BuSpar 2.5 mg to be taken in the event of a panic attack.  Would avoid benzos given advanced age and COPD  Abdominal pain Patient stated that after she became short of breath and anxious, her stomach began to cramp.  On examination, she does have some epigastric tenderness, potentially in the setting of gastritis versus IBS.  Symptoms improved with GI cocktail.  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  I suspect her event overnight was a nocturnal panic attack.  It looks like her PCP did trial Xanax, but it was not reordered.  Patient is uncertain if she took it.  Discussed the fine balance between controlling anxiety but not decreasing her respiratory  drive in the setting of advanced COPD.  - Recommend trial of BuSpar 2.5 mg to be taken in the event of a panic attack - Continue home Prozac - Continue following up with PCP; could consider referral to a geriatric psychiatrist  Elevated troponin Initial troponin elevated at 49 with flat trend to 55.  EKG with no changes.  Per chart review, patient has history of intermittently elevated troponins with negative cardiac workups each time.  No indication at this time for further cardiac workup given she had a negative stress test 6 weeks ago.  She also follows up regularly with cardiology.  Hyponatremia Chronic and stable at this time.  Chronic heart failure with preserved ejection fraction (HFpEF) (HCC) On examination, patient is euvolemic with negative BNP.  Patient denies any orthopnea, lower extremity swelling.  Due to this, low suspicion for an that current presentation is in the setting of heart failure exacerbation.  Aortic stenosis, moderate Low suspicion that this is currently contributing to current presentation in the absence of heart failure symptoms.  Murmur notable on examination.  - Continue following with cardiology   No indication for hospitalization at this time.  [facility] will sign off at present, please call us again when needed.  -----------------------------------------------------------------------------------------------  HPI: Linda Brady is a 82 y.o. female with past medical history of nonobstructive CAD, hypertension, generalized anxiety disorder with panic disorder, COPD on as needed supplemental oxygen, who presents to the ED due to nausea, shortness of breath and abdominal pain.  [name] states that she woke up in the middle the night, and was experiencing trouble breathing.  Very quickly thereafter, her breathing became worse and she started feeling very anxious with right lower extremity cramping.  Her symptoms gradually turned into nausea with abdominal  cramps.  At this time, she endorses continued nausea with abdominal cramps but her breathing and her anxiety have improved.  She states that this has occurred in the past and she wonders if it is related to her anxiety.  She denies any fever, chills, vomiting, melena, hematochezia, hematemesis, chest pain, palpitations, lower extremity swelling.   She states that she has been experiencing diffuse abdominal cramps for some time now in addition to abdominal distention.  She has followed up with her PCP regarding this.  She is quite worried about what the symptoms would be from.  She also endorses significant general anxiety with panic attacks.  She states that her PCP recently increased her fluoxetine and discontinued another medication (per chart review, trial of Xanax was attempted but not continued).  ED course: On arrival to the ED, patient was hypertensive at 172/80 with heart rate of 80.  She was saturating at 95% on room air.  She was afebrile at 98.  Blood pressure subsequently improved to 160/70 with no intervention.  Initial workup remarkable for sodium of 129, potassium of 3.3, chloride of 91, creatinine of 0.78.  Troponin initially elevated at 49 with flat trend to 55.  BNP within normal limits.  CBC with WBC of 5.7, hemoglobin of 12.7 and platelets of 438.  CTA chest and CT abdomen/pelvis were obtained with no findings to explain patient's symptoms.  Lower extremity DVT study was negative.  EKG with no changes.  Due to patient's continued times, TRH contacted for consideration of admission.  Review of Systems: As mentioned in the history of present illness. All other systems reviewed and are negative.  Past Medical History:  Diagnosis Date   COPD (chronic obstructive pulmonary disease) (HCC)    Hypertension    Thyroid disease    Past Surgical History:  Procedure Laterality Date   BREAST CYST ASPIRATION Left 10 plus yrs   benign-twice   LEFT HEART CATH AND CORONARY ANGIOGRAPHY N/A  09/11/2020   Procedure: LEFT HEART CATH AND CORONARY ANGIOGRAPHY;  Surgeon: Marcina Millard, MD;  Location: [facility] INVASIVE [facility];  Service: Cardiovascular;  Laterality: N/A;   Social History:  reports that she has quit smoking. Her smoking use included cigarettes. She has a 6.00 pack-year smoking history. She has never used smokeless tobacco. She reports that she does not currently use alcohol. She reports that she does not currently use drugs.  Allergies  Allergen Reactions   Naproxen Sodium Anaphylaxis    Family History  Problem Relation Age of Onset   Breast cancer Neg Hx     Prior to Admission medications   Medication Sig Start Date End Date Taking? Authorizing Provider  albuterol (VENTOLIN HFA) 108 (90 Base) MCG/ACT inhaler Inhale 2 puffs into the lungs every 4 (four) hours as needed for wheezing or shortness of breath. 12/16/21   Pennie Banter, DO  aspirin 81 MG EC tablet Take 81 mg by mouth daily.    [provider]  cholecalciferol (VITAMIN D3) 25 MCG (1000 UNIT) tablet Take 1,000 Units by mouth daily.    [provider]  enalapril (VASOTEC) 20 MG tablet Take 1 tablet (20 mg total) by mouth 2 (two) times daily. 09/10/22   Furth, Cadence H, PA-C  FLUoxetine (PROZAC) 20 MG capsule Take 20 mg by mouth daily. 03/14/20   [provider]  hydrochlorothiazide (HYDRODIURIL) 25 MG tablet Take 0.5 tablets (12.5 mg total) by mouth daily. 09/10/22   Furth, Cadence H, PA-C  hydrOXYzine (ATARAX/VISTARIL) 25 MG tablet Take 25 mg by mouth 3 (three) times daily as needed for anxiety. 12/13/19   [provider]  Ipratropium-Albuterol (COMBIVENT) 20-100 MCG/ACT AERS respimat Inhale 1 puff into the lungs 4 (four) times daily. 04/14/22   Enzo Bi, MD  isosorbide mononitrate (IMDUR) 30 MG 24 hr tablet Take 1 tablet (30 mg total) by mouth daily. 10/14/22   Furth, Cadence H, PA-C  levothyroxine (SYNTHROID) 75 MCG tablet Take 75 mcg by mouth daily. 05/02/20    [provider]  RABEprazole (ACIPHEX) 20 MG tablet Take 1 tablet (20 mg) by mouth once daily    [provider]  simvastatin (ZOCOR) 20 MG tablet Take 10 mg by mouth daily. 04/02/21   [provider]  vitamin B-12 (CYANOCOBALAMIN) 1000 MCG tablet Take 1,000 mcg by mouth daily.    [provider]    Physical Exam: Vitals:   12/05/22 0835 12/05/22 0916 12/05/22 1144 12/05/22 1446  BP: (!) 172/80 (!) 148/70 (!) 163/74 (!) 160/70  Pulse: 80 77 84 80  Resp:  18 18 18   Temp: 98 F (36.7 C) 98 F (36.7 C)  98 F (36.7 C)  TempSrc: Oral Oral  Oral  SpO2: 95% 97% 98% 98%  Weight:      Height:       Physical Exam Vitals and nursing note reviewed.  Constitutional:      General: She is not in acute distress.    Appearance: She is obese. She is not toxic-appearing.  HENT:     Head: Normocephalic and atraumatic.     Mouth/Throat:     Mouth: Mucous membranes are moist.     Pharynx: Oropharynx is clear.  Eyes:     Extraocular Movements: Extraocular movements intact.     Pupils: Pupils are equal, round, and reactive to light.  Neck:     Vascular: No JVD.  Cardiovascular:     Rate and Rhythm: Normal rate and regular rhythm.     Heart sounds: Murmur (descresendo systolic murmur, 3/6 heard throughout) heard.  Pulmonary:     Effort: Pulmonary effort is normal. No tachypnea, accessory muscle usage or respiratory distress.     Breath sounds: Examination of the left-lower field reveals rales. Rales present. No decreased breath sounds, wheezing or rhonchi.  Abdominal:     General: Bowel sounds are normal.     Palpations: Abdomen is soft.     Tenderness: There is abdominal tenderness (Epigastric only).  Musculoskeletal:     Cervical back: Neck supple.     Right lower leg: Edema (trace piting up to mid shin) present.     Left lower leg: Edema (trace pitting up to mid shin) present.  Skin:    General: Skin is warm and dry.  Neurological:     General: No  focal deficit present.     Mental Status: She is alert and oriented to person, place, and time.     Motor: No weakness.  Psychiatric:        Mood and Affect: Mood is anxious.        Behavior: Behavior normal.    Data Reviewed:  CBC with WBC of 5.7, hemoglobin of 12.7, platelets of 438 BMP with sodium of 129, potassium of 3.3, chloride of 91, glucose of 112, bicarb of 25, creatinine of 0.78, and GFR over 60  BNP 89 Initial troponin 59 with flat trend to 55 COVID-19, influenza and RSV negative  EKG personally reviewed.  Sinus rhythm with borderline PR prolongation.  Rate of 80.  No ST or T wave changes to suggest acute ischemia.  Compared to EKG obtained on 09/10/2022, no changes noted.  Myoview Lexiscan on 10/21/2022 with normal, low risk study.  LV perfusion was normal with no evidence of ischemia.  LV function was normal.  TTE obtained on 12/15/2021 with a EF of 65-70% and no regional wall motion abnormalities.  Grade 2 diastolic dysfunction noted.  Moderate aortic valve stenosis noted.  CT Angio Chest PE W and/or Wo Contrast  Result Date: 12/05/2022 CLINICAL DATA:  Shortness of breath and abdominal pain EXAM: CT ANGIOGRAPHY CHEST CT ABDOMEN AND PELVIS WITH CONTRAST TECHNIQUE: Multidetector CT imaging of the chest was performed using the standard protocol during bolus administration of intravenous contrast. Multiplanar CT image reconstructions and MIPs were obtained to evaluate the vascular anatomy. Multidetector CT imaging of the abdomen and pelvis was performed using the standard protocol during bolus administration of intravenous contrast. RADIATION DOSE REDUCTION: This exam was performed according to the departmental dose-optimization program which includes automated exposure control, adjustment of the mA and/or kV according to patient size and/or use of iterative reconstruction technique. CONTRAST:  75mL OMNIPAQUE IOHEXOL 350 MG/ML SOLN COMPARISON:  Chest CT dated June 13, 2022; CT abdomen  and pelvis dated November 12 2012 FINDINGS: CTA CHEST FINDINGS Cardiovascular: Evidence of pulmonary embolus, although streak and respiratory motion artifact somewhat limits evaluation. Normal heart size. No pericardial effusion. Moderate coronary artery calcifications. Normal caliber thoracic aorta with moderate atherosclerotic disease. Mediastinum/Nodes: Moderate hiatal hernia. Thyroid is unremarkable. No pathologically enlarged lymph nodes seen in the chest. Lungs/Pleura: Central airways are patent. Interval decreased right lower lobe opacity, likely sequela of prior infection or aspiration. Similar linear opacities of the right middle lobe and lingula which are likely due to scarring. No pleural effusion or pneumothorax. Musculoskeletal: No chest wall abnormality. No acute or significant osseous findings. Review of the MIP images confirms the above findings. CT ABDOMEN and PELVIS FINDINGS Hepatobiliary: No focal liver abnormality is seen. Status post cholecystectomy. No biliary dilatation. Pancreas: Unremarkable. No pancreatic ductal dilatation or surrounding inflammatory changes. Spleen: Normal in size without focal abnormality. Adrenals/Urinary Tract: Bilateral adrenal glands are unremarkable. No hydronephrosis nephrolithiasis. Bilateral low-attenuation renal lesions, largest are compatible with simple cysts, others are too small to completely characterize, no specific follow-up imaging is recommended. Stomach/Bowel: Stomach is within normal limits. No evidence of bowel wall thickening, distention, or inflammatory changes. Vascular/Lymphatic: Aortic atherosclerosis. No enlarged abdominal or pelvic lymph nodes. Reproductive: No adnexal masses. Other: No abdominal wall hernia or abnormality. No abdominopelvic ascites. Musculoskeletal: No acute or significant osseous findings. Review of the MIP images confirms the above findings. IMPRESSION: 1. No evidence of pulmonary embolus, although streak and respiratory  motion artifact somewhat limits evaluation. 2. No acute findings in the abdomen or pelvis. 3. Interval decreased right lower lobe opacity, likely sequela of prior infection or aspiration. 4. Moderate hiatal hernia. 5. Aortic Atherosclerosis (ICD10-I70.0). Electronically Signed   By: Allegra LaiLeah  Strickland M.D.   On: 12/05/2022 13:19   CT Abdomen Pelvis W Contrast  Result Date: 12/05/2022 CLINICAL DATA:  Shortness of breath and abdominal pain EXAM: CT ANGIOGRAPHY CHEST CT ABDOMEN AND PELVIS WITH CONTRAST TECHNIQUE: Multidetector CT imaging of the chest was performed using the standard protocol during bolus administration of intravenous contrast. Multiplanar CT image reconstructions and MIPs were  obtained to evaluate the vascular anatomy. Multidetector CT imaging of the abdomen and pelvis was performed using the standard protocol during bolus administration of intravenous contrast. RADIATION DOSE REDUCTION: This exam was performed according to the departmental dose-optimization program which includes automated exposure control, adjustment of the mA and/or kV according to patient size and/or use of iterative reconstruction technique. CONTRAST:  73mL OMNIPAQUE IOHEXOL 350 MG/ML SOLN COMPARISON:  Chest CT dated June 13, 2022; CT abdomen and pelvis dated November 12 2012 FINDINGS: CTA CHEST FINDINGS Cardiovascular: Evidence of pulmonary embolus, although streak and respiratory motion artifact somewhat limits evaluation. Normal heart size. No pericardial effusion. Moderate coronary artery calcifications. Normal caliber thoracic aorta with moderate atherosclerotic disease. Mediastinum/Nodes: Moderate hiatal hernia. Thyroid is unremarkable. No pathologically enlarged lymph nodes seen in the chest. Lungs/Pleura: Central airways are patent. Interval decreased right lower lobe opacity, likely sequela of prior infection or aspiration. Similar linear opacities of the right middle lobe and lingula which are likely due to scarring. No  pleural effusion or pneumothorax. Musculoskeletal: No chest wall abnormality. No acute or significant osseous findings. Review of the MIP images confirms the above findings. CT ABDOMEN and PELVIS FINDINGS Hepatobiliary: No focal liver abnormality is seen. Status post cholecystectomy. No biliary dilatation. Pancreas: Unremarkable. No pancreatic ductal dilatation or surrounding inflammatory changes. Spleen: Normal in size without focal abnormality. Adrenals/Urinary Tract: Bilateral adrenal glands are unremarkable. No hydronephrosis nephrolithiasis. Bilateral low-attenuation renal lesions, largest are compatible with simple cysts, others are too small to completely characterize, no specific follow-up imaging is recommended. Stomach/Bowel: Stomach is within normal limits. No evidence of bowel wall thickening, distention, or inflammatory changes. Vascular/Lymphatic: Aortic atherosclerosis. No enlarged abdominal or pelvic lymph nodes. Reproductive: No adnexal masses. Other: No abdominal wall hernia or abnormality. No abdominopelvic ascites. Musculoskeletal: No acute or significant osseous findings. Review of the MIP images confirms the above findings. IMPRESSION: 1. No evidence of pulmonary embolus, although streak and respiratory motion artifact somewhat limits evaluation. 2. No acute findings in the abdomen or pelvis. 3. Interval decreased right lower lobe opacity, likely sequela of prior infection or aspiration. 4. Moderate hiatal hernia. 5. Aortic Atherosclerosis (ICD10-I70.0). Electronically Signed   By: Yetta Glassman M.D.   On: 12/05/2022 13:19   US Venous Img Lower Right (DVT Study)  Result Date: 12/05/2022 CLINICAL DATA:  82 year old female with calf pain EXAM: RIGHT LOWER EXTREMITY VENOUS DOPPLER ULTRASOUND TECHNIQUE: Gray-scale sonography with graded compression, as well as color Doppler and duplex ultrasound were performed to evaluate the lower extremity deep venous systems from the level of the common  femoral vein and including the common femoral, femoral, profunda femoral, popliteal and calf veins including the posterior tibial, peroneal and gastrocnemius veins when visible. The superficial great saphenous vein was also interrogated. Spectral Doppler was utilized to evaluate flow at rest and with distal augmentation maneuvers in the common femoral, femoral and popliteal veins. COMPARISON:  None Available. FINDINGS: Contralateral Common Femoral Vein: Respiratory phasicity is normal and symmetric with the symptomatic side. No evidence of thrombus. Normal compressibility. Common Femoral Vein: No evidence of thrombus. Normal compressibility, respiratory phasicity and response to augmentation. Saphenofemoral Junction: No evidence of thrombus. Normal compressibility and flow on color Doppler imaging. Profunda Femoral Vein: No evidence of thrombus. Normal compressibility and flow on color Doppler imaging. Femoral Vein: No evidence of thrombus. Normal compressibility, respiratory phasicity and response to augmentation. Popliteal Vein: No evidence of thrombus. Normal compressibility, respiratory phasicity and response to augmentation. Calf Veins: No evidence of thrombus. Normal compressibility and  flow on color Doppler imaging. Superficial Great Saphenous Vein: No evidence of thrombus. Normal compressibility and flow on color Doppler imaging. Other Findings:  None. IMPRESSION: Directed duplex right lower extremity negative for DVT Signed, Yvone Neu. Miachel Roux, RPVI [facility] Radiology Electronically Signed   By: Gilmer Mor D.O.   On: 12/05/2022 11:52   DG Chest 2 View  Result Date: 12/05/2022 CLINICAL DATA:  Short of breath, difficulty breathing EXAM: CHEST - 2 VIEW COMPARISON:  08/25/2022 FINDINGS: Frontal and lateral views of the chest demonstrate a stable cardiac silhouette. Patchy consolidation at the right lung base slightly more pronounced than prior  study, which could reflect infection, aspiration, or atelectasis. No effusion or pneumothorax. No acute bony abnormalities. IMPRESSION: 1. Increased density within the right lung base, which could reflect infection, aspiration, or atelectasis/scarring. Electronically Signed   By: Sharlet Salina M.D.   On: 12/05/2022 09:11    There are no new results to review at this time.   Family Communication: No family at bedside Primary team communication: Discussed with Kem Boroughs, NP Thank you very much for involving [place] in the care of your patient.  Author: Verdene Lennert, MD 12/05/2022 4:29 PM  For on call review [email or web address].

## 2022-12-05 NOTE — ED Provider Notes (Signed)
[facility] Provider Note    Event Date/Time   First MD Initiated Contact with Patient 12/05/22 0740     (approximate)   History   Shortness of Breath   HPI  Linda Brady is a 82 y.o. female with a past medical history of coronary artery disease, COPD who presents today for evaluation of shortness of breath.  Patient reports that her symptoms began yesterday.  She called EMS this morning who reports that she had increased work of breathing but maintained normal oxygen levels of 96% on room air.  Patient denies any weight gain.  She reports that she has to sleep with her head elevated which is not new for her.  She uses oxygen at home as needed, reports that she uses it in the morning and at night which is her baseline.  She denies chest pain currently, though reports that she feels nauseated.  EMS gave her 1 DuoNeb en route and reports that her work of breathing improved significantly.  Patient denies fevers.  She reports that she has also had epigastric abdominal pain and nausea for the past several months but feels that it has worsened over the past couple of days.  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   Chronic heart failure with preserved ejection fraction (HFpEF) ([place]) 12/16/2021   Elevated troponin 12/15/2021   Anxiety 12/15/2021   Coronary artery disease involving native coronary artery of native heart    Aortic stenosis, moderate    COPD with acute exacerbation ([place]) 12/14/2021   Hyponatremia 12/14/2021   Dyslipidemia 12/14/2021   GERD (gastroesophageal reflux disease) 12/14/2021   Vitamin B12 deficiency 12/14/2021   S/P cardiac catheterization 09/19/2020   Chest pain 09/10/2020   Hypokalemia 09/10/2020   Unstable angina (HCC) 09/10/2020   Hypothyroidism    COPD (chronic obstructive pulmonary disease) ([place])     Essential hypertension           Physical Exam   Triage Vital Signs: ED Triage Vitals  Enc Vitals Group     BP      Pulse      Resp      Temp      Temp src      SpO2      Weight      Height      Head Circumference      Peak Flow      Pain Score      Pain Loc      Pain Edu?      Excl. in [place]?     Most recent vital signs: Vitals:   12/05/22 1144 12/05/22 1446  BP: (!) 163/74 (!) 160/70  Pulse: 84 80  Resp: 18 18  Temp:  98 F (36.7 C)  SpO2: 98% 98%    Physical Exam Vitals and nursing note reviewed.  Constitutional:      General: Awake and alert. No acute distress.    Appearance: Normal appearance. The patient is overweight.  HENT:     Head: Normocephalic and atraumatic.     Mouth: Mucous membranes are moist.  Eyes:     General: PERRL. Normal EOMs        Right eye: No discharge.        Left eye: No discharge.     Conjunctiva/sclera: Conjunctivae normal.  Cardiovascular:     Rate and  Rhythm: Normal rate and regular rhythm.     Pulses: Normal pulses.  Pulmonary:     Effort: Mild tachypnea but able to speak in complete sentences.  Prolonged expiratory phase    Breath sounds: Faint expiratory wheezes bilaterally Abdominal:     Abdomen is soft. There is no abdominal tenderness. No rebound or guarding. No distention. Musculoskeletal:        General: No swelling. Normal range of motion.     Cervical back: Normal range of motion and neck supple.  Trace pitting edema bilaterally Skin:    General: Skin is warm and dry.     Capillary Refill: Capillary refill takes less than 2 seconds.     Findings: No rash.  Neurological:     Mental Status: The patient is awake and alert.      ED Results / Procedures / Treatments   Labs (all labs ordered are listed, but only abnormal results are displayed) Labs Reviewed  CBC WITH DIFFERENTIAL/PLATELET - Abnormal; Notable for the following components:      Result Value   Platelets 438 (*)    All other components  within normal limits  BASIC METABOLIC PANEL - Abnormal; Notable for the following components:   Sodium 129 (*)    Potassium 3.3 (*)    Chloride 91 (*)    Glucose, Bld 115 (*)    All other components within normal limits  TROPONIN I (HIGH SENSITIVITY) - Abnormal; Notable for the following components:   Troponin I (High Sensitivity) 49 (*)    All other components within normal limits  TROPONIN I (HIGH SENSITIVITY) - Abnormal; Notable for the following components:   Troponin I (High Sensitivity) 55 (*)    All other components within normal limits  RESP PANEL BY RT-PCR (RSV, FLU A&B, COVID)  RVPGX2  BRAIN NATRIURETIC PEPTIDE     EKG     RADIOLOGY I independently reviewed and interpreted imaging and agree with radiologists findings.  Myoview Lexiscan 10/21/22 Narrative & Impression      The study is normal. The study is low risk.   No ST deviation was noted.   LV perfusion is normal. There is no evidence of ischemia. There is no evidence of infarction.   Left ventricular function is normal. End diastolic cavity size is normal. End systolic cavity size is normal.   CT continuation images showed moderate aortic calcifications and mild coronary calcifications.        Echo 12/15/21 1. Left ventricular ejection fraction, by estimation, is 65 to 70%. The  left ventricle has normal function. The left ventricle has no regional  wall motion abnormalities. Left ventricular diastolic parameters are  consistent with Grade II diastolic  dysfunction (pseudonormalization).   2. Right ventricular systolic function is normal. The right ventricular  size is normal.   3. The mitral valve is degenerative. No evidence of mitral valve  regurgitation.   4. Aortic valve DVI=0.38, Vmax 13m/s, mean gradient 24mmHg, PG 45mmHg AVA  1.2cm2.. The aortic valve is calcified. Aortic valve regurgitation is  mild. Moderate aortic valve stenosis.   5. The inferior vena cava is normal in size with greater than  50%  respiratory variability, suggesting right atrial pressure of 3 mmHg.    Cardiac cath 09/2020  Ost RCA to Prox RCA lesion is 50% stenosed. Prox LAD lesion is 30% stenosed.    PROCEDURES:  Critical Care performed:   Procedures   MEDICATIONS ORDERED IN ED: Medications  ipratropium-albuterol (DUONEB) 0.5-2.5 (3) MG/3ML nebulizer  solution 3 mL (3 mLs Nebulization Given 12/05/22 0753)  iohexol (OMNIPAQUE) 350 MG/ML injection 75 mL (75 mLs Intravenous Contrast Given 12/05/22 1223)     IMPRESSION / MDM / ASSESSMENT AND PLAN / ED COURSE  I reviewed the triage vital signs and the nursing notes.   Differential diagnosis includes, but is not limited to, COVID, influenza, pneumonia, COPD exacerbation, heart failure exacerbation.  Patient is awake and alert, hemodynamically stable and afebrile.  She was placed on the cardiac monitor and continuous pulse oximetry.   Further workup is indicated.  Labs, EKG, chest x-ray obtained.  She was noted to have an uptrending troponin from 49 to 55.  She complained of spasms in her right lower extremity therefore ultrasound and CTA also obtained.  Ultrasound is negative for DVT.  Chest x-ray showed question of pneumonia, though there is no evidence of pneumonia on CTA (appears to be sequela of prior infection and is decreased from prior, and patient has no clinical signs of symptoms of pneumonia), and CTA is negative for PE. DVT study is negative as well.  CT abdomen and pelvis demonstrates no acute intra-abdominal findings.  Patient continues to have feeling of shortness of breath.  She does not have any active chest pain.  However, patient meets criteria for admission to the hospitalist for observation given her increase in her troponin by 6 given our ED chest pain algorithm.  Additionally, heart score is 7. She has no active chest pain.  Patient agrees with plan for admission.  I discussed with Dr. Huel Cote who is unsure if she would like to admit given her  recent normal cardiac testing but has agreed to see her before making final admission decision.  Patient was discussed with and also seen by Dr. Alfred Levins who agrees with assessment and plan.  Patient passed off to Norberto Sorenson, NP pending imaging and final disposition,  Patient's presentation is most consistent with acute complicated illness / injury requiring diagnostic workup.  Clinical Course as of 12/05/22 1503  Thu Dec 05, 2022  1414 Discussed with hospitalist who will consult on the patient [JP]    Clinical Course User Index [JP] Deneen Slager, Herb Grays, PA-C     FINAL CLINICAL IMPRESSION(S) / ED DIAGNOSES   Final diagnoses:  Dyspnea, unspecified type  Elevated troponin     Rx / DC Orders   ED Discharge Orders     None        Note:  This document was prepared using Dragon voice recognition software and may include unintentional dictation errors.   Keturah Shavers 12/05/22 1503    Concha Se, MD 12/05/22 [phone]

## 2022-12-05 NOTE — Assessment & Plan Note (Signed)
Chronic. -Platelet count slowly trending down, monitor closely. -Patient denies any bleeding. 

## 2022-12-05 NOTE — Assessment & Plan Note (Signed)
Initial troponin elevated at 49 with flat trend to 55.  EKG with no changes.  Per chart review, patient has history of intermittently elevated troponins with negative cardiac workups each time.  No indication at this time for further cardiac workup given she had a negative stress test 6 weeks ago.  She also follows up regularly with cardiology.

## 2022-12-05 NOTE — ED Triage Notes (Signed)
Presents via EMS from home with diff breathing   Per EMS  she uses supp.O2 as needed   No fever  IV 22 in right hand  DUO neb times 1

## 2022-12-05 NOTE — Discharge Instructions (Signed)
Take Maalox or Mylanta as directed on the packaging.  Follow up with your primary care provider or cardiologist.  Return to the ER for any symptoms of concern if unable to get an appointment.

## 2023-03-12 ENCOUNTER — Encounter: Payer: Self-pay | Admitting: Ophthalmology

## 2023-03-18 NOTE — Discharge Instructions (Signed)

## 2023-03-19 IMAGING — CT CT ANGIO CHEST
2 of 7 series · 18 of 46 positions shown · IV contrast (APPLIED)
Comparison: CT done on 09/10/2020, chest radiograph done today

CLINICAL DATA: Shortness of breath, high clinical suspicion for PE

EXAM:
CT ANGIOGRAPHY CHEST WITH CONTRAST
TECHNIQUE: Multidetector CT imaging of the chest was performed using the
standard protocol during bolus administration of intravenous
contrast. Multiplanar CT image reconstructions and MIPs were
obtained to evaluate the vascular anatomy.

[Series 6: thins · axial · 0.57mm/px · z∈[-810,-575]mm · 15 of 380 slices shown]
[im 22/380  lung]
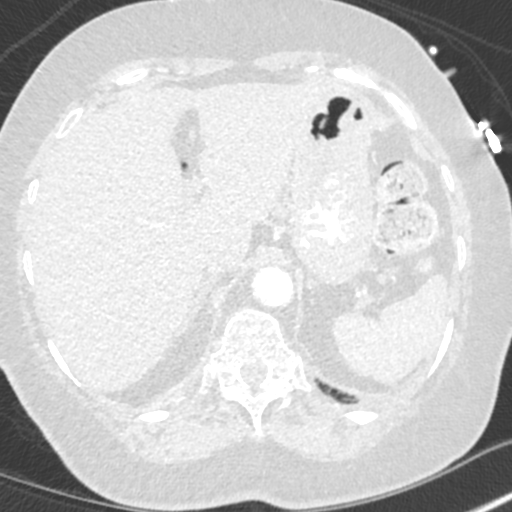
[im 43/380  soft-tissue]
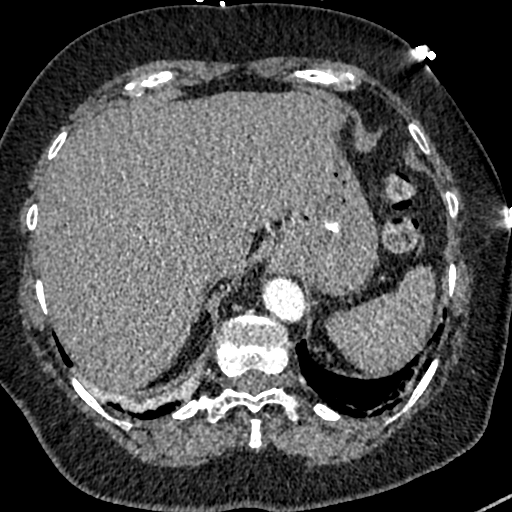
[im 64/380  lung]
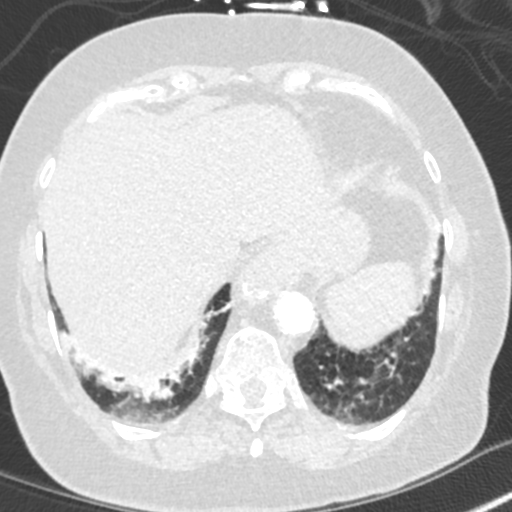
[im 85/380  soft-tissue]
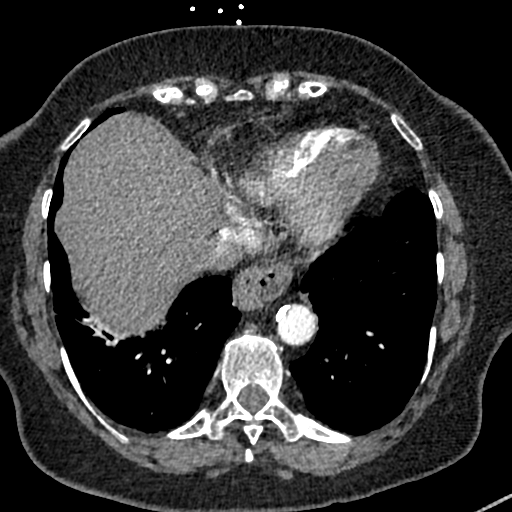
[im 127/380  lung]
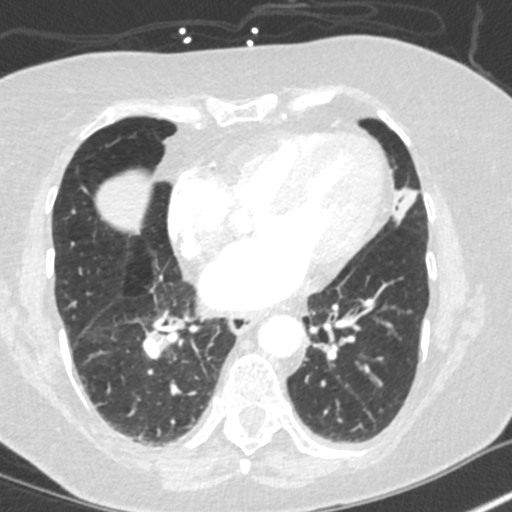
[im 148/380  soft-tissue]
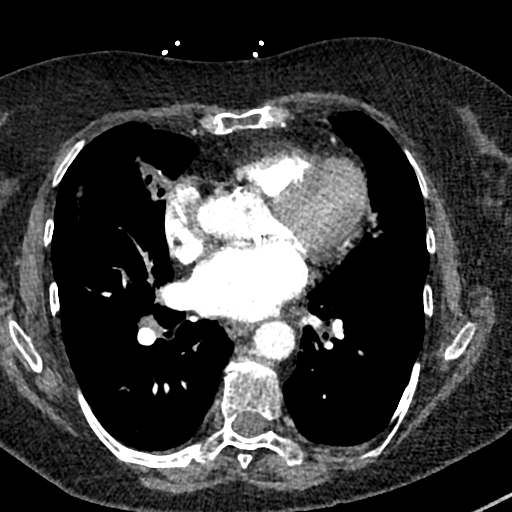
[im 169/380  lung]
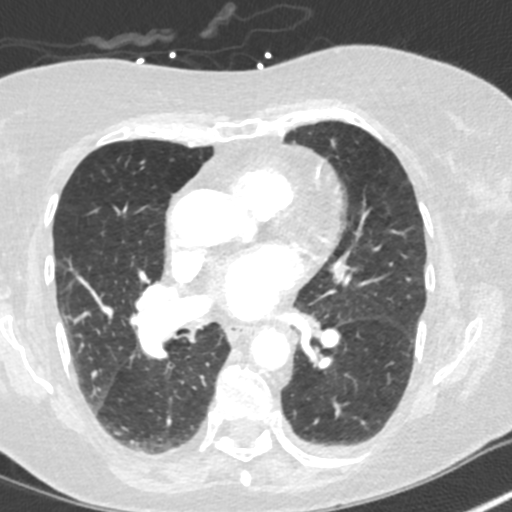
[im 190/380  soft-tissue]
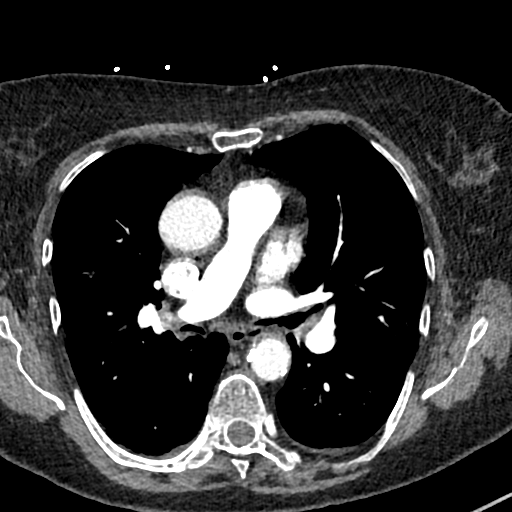
[im 211/380  lung]
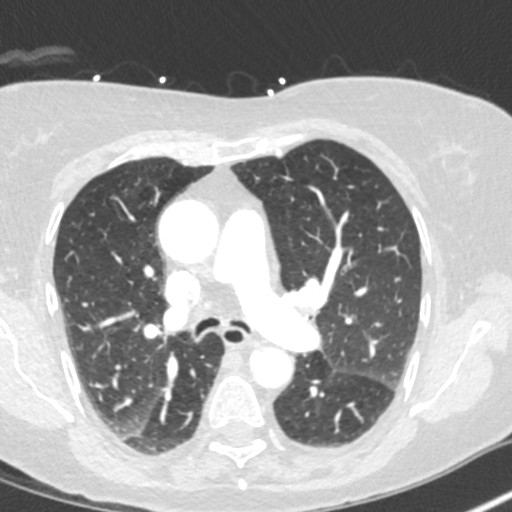
[im 232/380  soft-tissue]
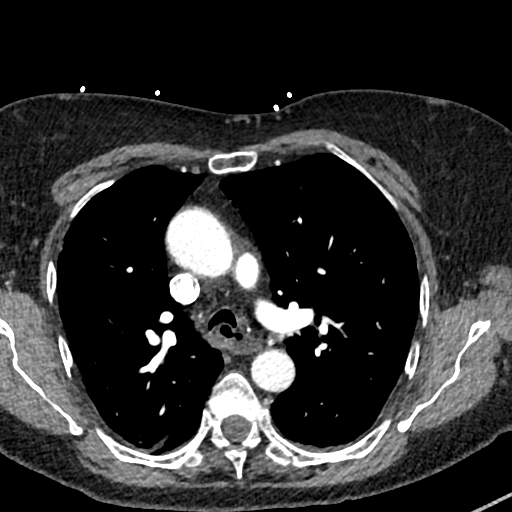
[im 253/380  lung]
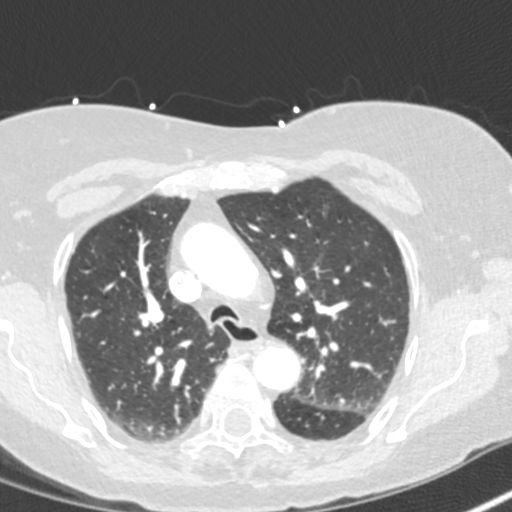
[im 295/380  soft-tissue]
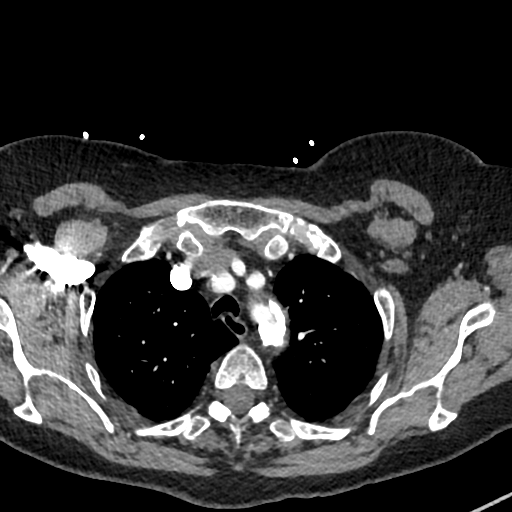
[im 316/380  lung]
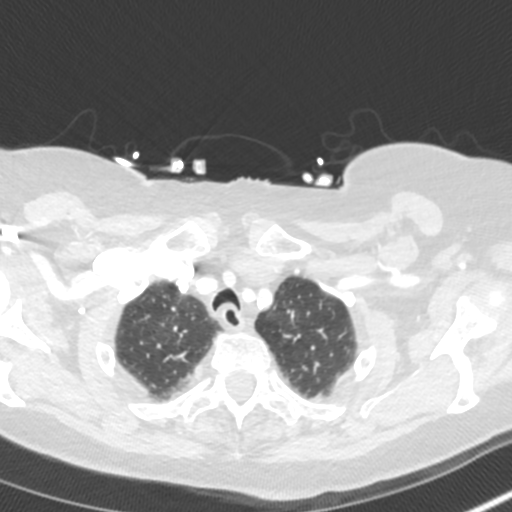
[im 337/380  soft-tissue]
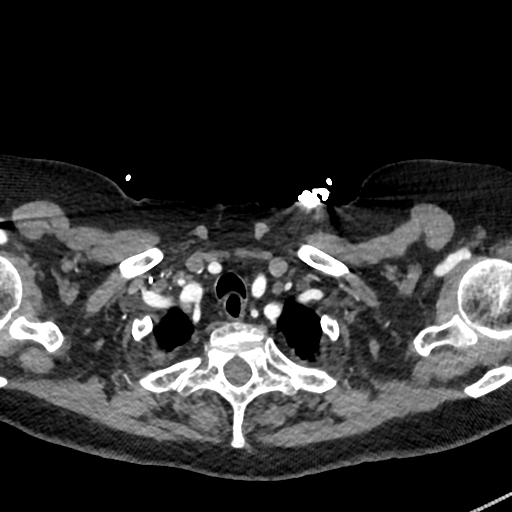
[im 358/380  lung]
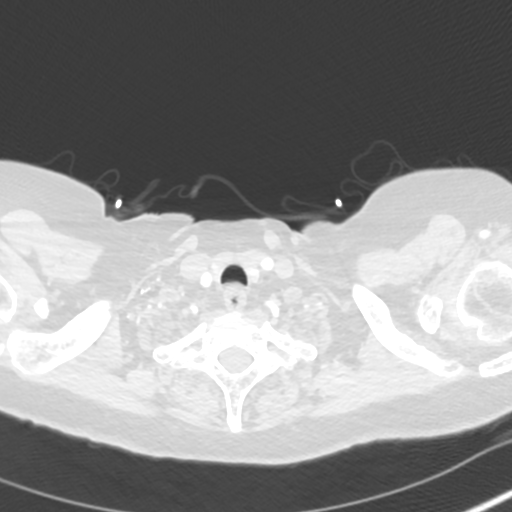

[Series 7: cor · coronal · 0.57mm/px · 3 of 153 slices shown]
[im 39/153  soft-tissue]
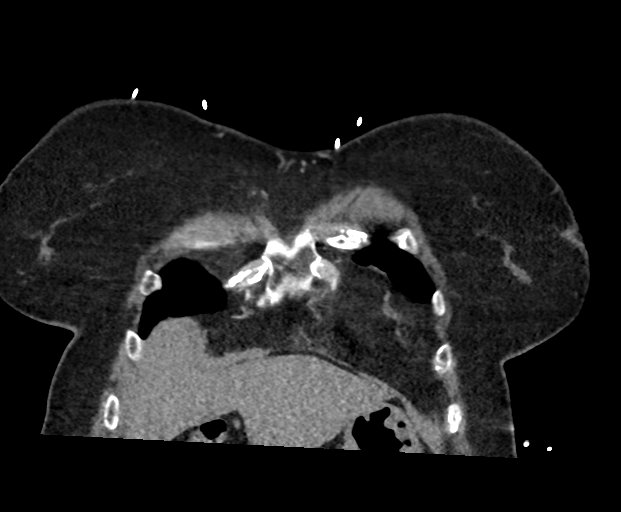
[im 77/153  soft-tissue]
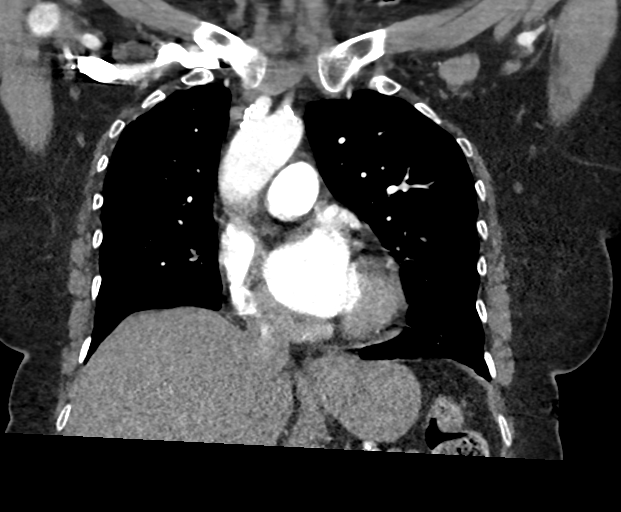
[im 115/153  soft-tissue]
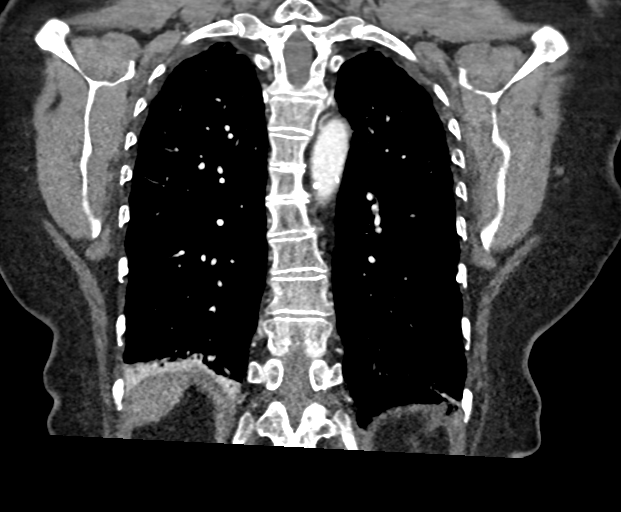

[18 of 46 positions shown; findings below may reference images not displayed]

RADIATION DOSE REDUCTION: This exam was performed according to the
departmental dose-optimization program which includes automated
exposure control, adjustment of the mA and/or kV according to
patient size and/or use of iterative reconstruction technique.

CONTRAST:  75mL OMNIPAQUE IOHEXOL 350 MG/ML SOLN
FINDINGS: Cardiovascular: There is homogeneous enhancement in thoracic aorta.
There is ectasia of ascending thoracic aorta measuring 3.6 cm
coronary artery calcifications are seen. Calcification is seen in
the aortic annulus. There are no intraluminal filling defects in the
pulmonary artery branches.

Mediastinum/Nodes: No significant lymphadenopathy seen.

Lungs/Pleura: There is narrowing of AP diameter of trachea and
mainstem bronchi suggesting possible bronchospasm or tracheomalacia.
Small patchy infiltrates are seen in the right middle lobe, lingula
and both lower lobes suggesting atelectasis/pneumonia. Part of this
finding may suggest underlying scarring. There is minimal pleural
effusions, more so on the left side. There is no pneumothorax.

Upper Abdomen: Surgical clips are seen in gallbladder fossa. Small
hiatal hernia is seen.

Musculoskeletal: Unremarkable.

Review of the MIP images confirms the above findings.
IMPRESSION: There is no evidence of pulmonary artery embolism. There is no
evidence of thoracic aortic dissection. Coronary artery
calcifications are seen. Calcifications are seen in the aortic
annulus. There is ectasia of ascending thoracic aorta.

Small patchy infiltrates are seen in the right middle lobe, lingula
and both lower lobes suggesting atelectasis/pneumonia. Minimal
pleural effusions.

Small hiatal hernia is seen.

## 2023-03-20 ENCOUNTER — Other Ambulatory Visit: Payer: Self-pay

## 2023-03-20 ENCOUNTER — Ambulatory Visit: Payer: Medicare Other | Admitting: Anesthesiology

## 2023-03-20 ENCOUNTER — Ambulatory Visit
Admission: RE | Admit: 2023-03-20 | Discharge: 2023-03-20 | Disposition: A | Discharge status: Home / Self Care | Payer: Medicare Other | Attending: Ophthalmology | Admitting: Ophthalmology

## 2023-03-20 ENCOUNTER — Encounter
Admission: RE | Disposition: A | Discharge status: Home / Self Care | Payer: Self-pay | Source: Home / Self Care | Attending: Ophthalmology

## 2023-03-20 DIAGNOSIS — R0609 Other forms of dyspnea: Secondary | ICD-10-CM | POA: Insufficient documentation

## 2023-03-20 DIAGNOSIS — Z87891 Personal history of nicotine dependence: Secondary | ICD-10-CM | POA: Insufficient documentation

## 2023-03-20 DIAGNOSIS — Z6831 Body mass index (BMI) 31.0-31.9, adult: Secondary | ICD-10-CM | POA: Insufficient documentation

## 2023-03-20 DIAGNOSIS — H2512 Age-related nuclear cataract, left eye: Secondary | ICD-10-CM | POA: Insufficient documentation

## 2023-03-20 DIAGNOSIS — K219 Gastro-esophageal reflux disease without esophagitis: Secondary | ICD-10-CM | POA: Insufficient documentation

## 2023-03-20 DIAGNOSIS — K449 Diaphragmatic hernia without obstruction or gangrene: Secondary | ICD-10-CM | POA: Insufficient documentation

## 2023-03-20 DIAGNOSIS — I5032 Chronic diastolic (congestive) heart failure: Secondary | ICD-10-CM | POA: Insufficient documentation

## 2023-03-20 DIAGNOSIS — I25119 Atherosclerotic heart disease of native coronary artery with unspecified angina pectoris: Secondary | ICD-10-CM | POA: Diagnosis not present

## 2023-03-20 DIAGNOSIS — E669 Obesity, unspecified: Secondary | ICD-10-CM | POA: Diagnosis not present

## 2023-03-20 DIAGNOSIS — F419 Anxiety disorder, unspecified: Secondary | ICD-10-CM | POA: Insufficient documentation

## 2023-03-20 DIAGNOSIS — I110 Hypertensive heart disease with heart failure: Secondary | ICD-10-CM | POA: Insufficient documentation

## 2023-03-20 DIAGNOSIS — E039 Hypothyroidism, unspecified: Secondary | ICD-10-CM | POA: Diagnosis not present

## 2023-03-20 DIAGNOSIS — R609 Edema, unspecified: Secondary | ICD-10-CM | POA: Diagnosis not present

## 2023-03-20 DIAGNOSIS — Z7982 Long term (current) use of aspirin: Secondary | ICD-10-CM | POA: Diagnosis not present

## 2023-03-20 DIAGNOSIS — J449 Chronic obstructive pulmonary disease, unspecified: Secondary | ICD-10-CM | POA: Insufficient documentation

## 2023-03-20 HISTORY — DX: Gastro-esophageal reflux disease without esophagitis: K21.9

## 2023-03-20 HISTORY — PX: CATARACT EXTRACTION W/PHACO: SHX586

## 2023-03-20 HISTORY — DX: Hypothyroidism, unspecified: E03.9

## 2023-03-20 SURGERY — PHACOEMULSIFICATION, CATARACT, WITH IOL INSERTION
Anesthesia: Monitor Anesthesia Care | Site: Eye | Laterality: Left

## 2023-03-20 MED ORDER — MIDAZOLAM HCL 2 MG/2ML IJ SOLN
INTRAMUSCULAR | Status: DC | PRN
  Administered 2023-03-20: 1 mg via INTRAVENOUS

## 2023-03-20 MED ORDER — LIDOCAINE HCL (PF) 2 % IJ SOLN
INTRAOCULAR | Status: DC | PRN
  Administered 2023-03-20: 1 mL via INTRAOCULAR

## 2023-03-20 MED ORDER — FENTANYL CITRATE (PF) 100 MCG/2ML IJ SOLN
INTRAMUSCULAR | Status: DC | PRN
  Administered 2023-03-20: 50 ug via INTRAVENOUS

## 2023-03-20 MED ORDER — LACTATED RINGERS IV SOLN: INTRAVENOUS | Status: DC

## 2023-03-20 MED ORDER — SIGHTPATH DOSE#1 NA HYALUR & NA CHOND-NA HYALUR IO KIT
PACK | INTRAOCULAR | Status: DC | PRN
  Administered 2023-03-20: 1 via OPHTHALMIC

## 2023-03-20 MED ORDER — SIGHTPATH DOSE#1 BSS IO SOLN
INTRAOCULAR | Status: DC | PRN
  Administered 2023-03-20: 82 mL via OPHTHALMIC

## 2023-03-20 MED ORDER — ARMC OPHTHALMIC DILATING DROPS
1.0000 | Freq: Once | OPHTHALMIC | Status: AC
  Administered 2023-03-20 (×3): 1 via OPHTHALMIC

## 2023-03-20 MED ORDER — BRIMONIDINE TARTRATE-TIMOLOL 0.2-0.5 % OP SOLN
OPHTHALMIC | Status: DC | PRN
  Administered 2023-03-20: 1 [drp] via OPHTHALMIC

## 2023-03-20 MED ORDER — TETRACAINE HCL 0.5 % OP SOLN
1.0000 [drp] | OPHTHALMIC | Status: DC | PRN
  Administered 2023-03-20 (×3): 1 [drp] via OPHTHALMIC

## 2023-03-20 MED ORDER — SIGHTPATH DOSE#1 BSS IO SOLN
INTRAOCULAR | Status: DC | PRN
  Administered 2023-03-20: 30 mL

## 2023-03-20 MED ORDER — MOXIFLOXACIN HCL 0.5 % OP SOLN
OPHTHALMIC | Status: DC | PRN
  Administered 2023-03-20: .2 mL via OPHTHALMIC

## 2023-03-20 SURGICAL SUPPLY — 22 items
BNDG EYE OVAL 2 1/8 X 2 5/8 (GAUZE/BANDAGES/DRESSINGS) IMPLANT
CANNULA ANT/CHMB 27G (MISCELLANEOUS) IMPLANT
CANNULA ANT/CHMB 27GA (MISCELLANEOUS) IMPLANT
CATARACT SUITE SIGHTPATH (MISCELLANEOUS) ×1 IMPLANT
DISSECTOR HYDRO NUCLEUS 50X22 (MISCELLANEOUS) ×1 IMPLANT
DRSG TEGADERM 2-3/8X2-3/4 SM (GAUZE/BANDAGES/DRESSINGS) ×1 IMPLANT
FEE CATARACT SUITE SIGHTPATH (MISCELLANEOUS) ×1 IMPLANT
GLOVE SURG SYN 7.5  E (GLOVE) ×1
GLOVE SURG SYN 7.5 E (GLOVE) ×1 IMPLANT
GLOVE SURG SYN 7.5 PF PI (GLOVE) ×1 IMPLANT
GLOVE SURG SYN 8.5  E (GLOVE) ×1
GLOVE SURG SYN 8.5 E (GLOVE) ×1 IMPLANT
GLOVE SURG SYN 8.5 PF PI (GLOVE) ×1 IMPLANT
LENS IOL TECNIS EYHANCE 20.0 (Intraocular Lens) IMPLANT
NDL FILTER BLUNT 18X1 1/2 (NEEDLE) IMPLANT
NEEDLE FILTER BLUNT 18X1 1/2 (NEEDLE) IMPLANT
PACK VIT ANT 23G (MISCELLANEOUS) IMPLANT
RING MALYGIN 7.0 (MISCELLANEOUS) IMPLANT
SUT ETHILON 10-0 CS-B-6CS-B-6 (SUTURE)
SUTURE EHLN 10-0 CS-B-6CS-B-6 (SUTURE) IMPLANT
SYR 3ML LL SCALE MARK (SYRINGE) IMPLANT
SYR 5ML LL (SYRINGE) IMPLANT

## 2023-03-20 NOTE — H&P (Signed)
[facility]   Primary Care Physician:  [name], MD Ophthalmologist: Dr. Deberah Pelton  Pre-Procedure History & Physical: HPI:  Linda Brady is a 82 y.o. female here for cataract surgery.   Past Medical History:  Diagnosis Date   COPD (chronic obstructive pulmonary disease)    GERD (gastroesophageal reflux disease)    Hypertension    Hypothyroidism    Thyroid disease     Past Surgical History:  Procedure Laterality Date   BREAST CYST ASPIRATION Left 10 plus yrs   benign-twice   LEFT HEART CATH AND CORONARY ANGIOGRAPHY N/A 09/11/2020   Procedure: LEFT HEART CATH AND CORONARY ANGIOGRAPHY;  Surgeon: Marcina Millard, MD;  Location: [facility] INVASIVE CV LAB;  Service: Cardiovascular;  Laterality: N/A;    Prior to Admission medications   Medication Sig Start Date End Date Taking? Authorizing Provider  albuterol (VENTOLIN HFA) 108 (90 Base) MCG/ACT inhaler Inhale 2 puffs into the lungs every 4 (four) hours as needed for wheezing or shortness of breath. 12/16/21   Pennie Banter, DO  aspirin 81 MG EC tablet Take 81 mg by mouth daily.    [provider]  busPIRone (BUSPAR) 5 MG tablet Take 0.5 tablets (2.5 mg total) by mouth 2 (two) times daily. Patient not taking: Reported on 03/12/2023 12/05/22   Kem Boroughs B, FNP  cholecalciferol (VITAMIN D3) 25 MCG (1000 UNIT) tablet Take 1,000 Units by mouth daily.    [provider]  enalapril (VASOTEC) 20 MG tablet Take 1 tablet (20 mg total) by mouth 2 (two) times daily. 09/10/22   Furth, Cadence H, PA-C  FLUoxetine (PROZAC) 20 MG capsule Take 20 mg by mouth daily. 03/14/20   [provider]  hydrochlorothiazide (HYDRODIURIL) 25 MG tablet Take 0.5 tablets (12.5 mg total) by mouth daily. 09/10/22   Furth, Cadence H, PA-C  hydrOXYzine (ATARAX/VISTARIL) 25 MG tablet Take 25 mg by mouth 3 (three) times daily as needed for anxiety. 12/13/19   [provider]  Ipratropium-Albuterol (COMBIVENT)  20-100 MCG/ACT AERS respimat Inhale 1 puff into the lungs 4 (four) times daily. 04/14/22   Darlin Priestly, MD  isosorbide mononitrate (IMDUR) 30 MG 24 hr tablet Take 1 tablet (30 mg total) by mouth daily. 10/14/22   Furth, Cadence H, PA-C  levothyroxine (SYNTHROID) 75 MCG tablet Take 75 mcg by mouth daily. 05/02/20   [provider]  RABEprazole (ACIPHEX) 20 MG tablet Take 1 tablet (20 mg) by mouth once daily    [provider]  simvastatin (ZOCOR) 20 MG tablet Take 10 mg by mouth daily. 04/02/21   [provider]  vitamin B-12 (CYANOCOBALAMIN) 1000 MCG tablet Take 1,000 mcg by mouth daily.    [provider]    Allergies as of 02/21/2023 - Review Complete 12/05/2022  Allergen Reaction Noted   Naproxen sodium Anaphylaxis 05/18/2014    Family History  Problem Relation Age of Onset   Breast cancer Neg Hx     Social History   Socioeconomic History   Marital status: Widowed    Spouse name: Not on file   Number of children: Not on file   Years of education: Not on file   Highest education level: Not on file  Occupational History   Not on file  Tobacco Use   Smoking status: Former    Packs/day: 0.50    Years: 12.00    Additional pack years: 0.00    Total pack years: 6.00    Types: Cigarettes   Smokeless tobacco:  Never   Tobacco comments:    7-8 cigarettes a day  Vaping Use   Vaping Use: Never used  Substance and Sexual Activity   Alcohol use: Not Currently   Drug use: Not Currently   Sexual activity: Not Currently  Other Topics Concern   Not on file  Social History Narrative   Not on file   Social Determinants of Health   Financial Resource Strain: Not on file  Food Insecurity: Not on file  Transportation Needs: Not on file  Physical Activity: Not on file  Stress: Not on file  Social Connections: Not on file  Intimate Partner Violence: Not on file    Review of Systems: See HPI, otherwise negative ROS  Physical Exam: Ht  (1.6 m)    Wt 82.6 kg   BMI 32.24 kg/m  General:   Alert, cooperative in NAD Head:  Normocephalic and atraumatic. Respiratory:  Normal work of breathing. Cardiovascular:  RRR  Impression/Plan: Linda Brady is here for cataract surgery.  Risks, benefits, limitations, and alternatives regarding cataract surgery have been reviewed with the patient.  Questions have been answered.  All parties agreeable.   Estanislado Pandy, MD  03/20/2023, 11:35 AM

## 2023-03-20 NOTE — Anesthesia Postprocedure Evaluation (Signed)
Anesthesia Post Note  Patient: Linda Brady  Procedure(s) Performed: CATARACT EXTRACTION PHACO AND INTRAOCULAR LENS PLACEMENT (IOC) LEFT VISION BLUE (Left: Eye)  Patient location during evaluation: PACU Anesthesia Type: MAC Level of consciousness: awake and alert Pain management: pain level controlled Vital Signs Assessment: post-procedure vital signs reviewed and stable Respiratory status: spontaneous breathing, nonlabored ventilation, respiratory function stable and patient connected to nasal cannula oxygen Cardiovascular status: stable and blood pressure returned to baseline Postop Assessment: no apparent nausea or vomiting Anesthetic complications: no   No notable events documented.   Last Vitals:  Vitals:   03/20/23 1341 03/20/23 1345  BP: (!) 141/55 (!) 139/56  Pulse: 78 80  Resp: 14 20  Temp: (!) 36.2 C   SpO2: 97% 96%    Last Pain:  Vitals:   03/20/23 1345  TempSrc:   PainSc: 0-No pain                 Mikiya Nebergall C Lashara Urey

## 2023-03-20 NOTE — Op Note (Signed)
OPERATIVE NOTE  Linda Brady [id number] 03/20/2023   PREOPERATIVE DIAGNOSIS: Nuclear sclerotic cataract left eye. H25.12   POSTOPERATIVE DIAGNOSIS: Nuclear sclerotic cataract left eye. H25.12   PROCEDURE:  Phacoemusification with posterior chamber intraocular lens placement of the left eye  Ultrasound time: Procedure(s) with comments: CATARACT EXTRACTION PHACO AND INTRAOCULAR LENS PLACEMENT (IOC) LEFT VISION BLUE (Left) - 21.02   01:38.2  LENS:   Implant Name Type Inv. Item Serial No. Manufacturer Lot No. LRB No. Used Action  LENS IOL TECNIS EYHANCE 20.0 - [id number] Intraocular Lens LENS IOL TECNIS EYHANCE 20.0 [id number] [employer]  Left 1 Implanted      SURGEON:  Julious Payer. Rolley Sims, MD   ANESTHESIA:  Topical with tetracaine drops, augmented with 1% preservative-free intracameral lidocaine.   COMPLICATIONS:  None.   DESCRIPTION OF PROCEDURE:  The patient was identified in the holding room and transported to the operating room and placed in the supine position under the operating microscope.  The left eye was identified as the operative eye, which was prepped and draped in the usual sterile ophthalmic fashion.   A 1 millimeter clear-corneal paracentesis was made inferotemporally. Preservative-free 1% lidocaine mixed with 1:1,000 bisulfite-free aqueous solution of epinephrine was injected into the anterior chamber. The anterior chamber was then filled with Viscoat viscoelastic. A 2.4 millimeter keratome was used to make a clear-corneal incision superotemporally. A curvilinear capsulorrhexis was made with a cystotome and capsulorrhexis forceps. Balanced salt solution was used to hydrodissect and hydrodelineate the nucleus. Phacoemulsification was then used to remove the lens nucleus and epinucleus. The remaining cortex was then removed using the irrigation and aspiration handpiece. Provisc was then placed into the capsular bag to distend it for lens placement. A +20.00 D DIB00 intraocular  lens was then injected into the capsular bag. The remaining viscoelastic was aspirated.   Wounds were hydrated with balanced salt solution.  The anterior chamber was inflated to a physiologic pressure with balanced salt solution.  No wound leaks were noted. Vigamox was injected intracamerally.  Timolol and Brimonidine drops were applied to the eye.  The patient was taken to the recovery room in stable condition without complications of anesthesia or surgery.  Rolly Pancake [place] 03/20/2023, 1:40 PM

## 2023-03-20 NOTE — Anesthesia Preprocedure Evaluation (Addendum)
Anesthesia Evaluation    Reviewed: H&P   Airway Mallampati: III  TM Distance: <3 FB Neck ROM: Full    Dental no notable dental hx.    Pulmonary COPD,  COPD inhaler, former smoker Hx covid pneumonia   Pulmonary exam normal breath sounds clear to auscultation       Cardiovascular Exercise Tolerance: Poor hypertension, Pt. on medications + angina  + CAD and + DOE  Normal cardiovascular exam+ Valvular Problems/Murmurs AS  Rhythm:Regular Rate:Normal + Systolic murmurs Echo 09-11-20 EF 65-70%, grade II diastoslic dysfunction, mild AR, mod AS  Note pitting edema both lower extremities, has compression stockings on Grade 2 pitting edema both lower extremities   Neuro/Psych   Anxiety     negative neurological ROS  negative psych ROS   GI/Hepatic Neg liver ROS, hiatal hernia,GERD  ,,Hiatal hernia per radiologic report   Endo/Other  Hypothyroidism    Renal/GU negative Renal ROS  negative genitourinary   Musculoskeletal negative musculoskeletal ROS (+)    Abdominal  (+) + obese  Peds negative pediatric ROS (+)  Hematology negative hematology ROS (+)   Anesthesia Other Findings GERD (gastroesophageal reflux disease)  12/14/2021 Chronic heart failure with preserved ejection fraction (HFpEF)  12/16/2021 Unstable angina  09/10/2020 Hypothyroidism  Unknown COPD (chronic obstructive pulmonary disease)  Unknown Essential hypertension  Unknown COPD with acute exacerbation  12/14/2021 Coronary artery disease involving native coronary artery of native heart  Unknown Aortic stenosis, moderate  Unknown Pneumonia due to COVID-19 virus  04/12/2022 Shortness of breath  12/05/2022 Other Dyslipidemia  12/14/2021 Vitamin B12 deficiency  12/14/2021 Anxiety  12/15/2021 Chest pain  09/10/2020 Hypokalemia  09/10/2020 Hyponatremia  12/14/2021 Elevated troponin  12/15/2021 Osteoporosis, post-menopausal  03/01/2022 S/P cardiac catheterization       Reproductive/Obstetrics negative OB ROS                             Anesthesia Physical Anesthesia Plan  ASA: 3  Anesthesia Plan: MAC   Post-op Pain Management:    Induction: Intravenous  PONV Risk Score and Plan:   Airway Management Planned: Natural Airway and Nasal Cannula  Additional Equipment:   Intra-op Plan:   Post-operative Plan:   Informed Consent: I have reviewed the patients History and Physical, chart, labs and discussed the procedure including the risks, benefits and alternatives for the proposed anesthesia with the patient or authorized representative who has indicated his/her understanding and acceptance.     Dental Advisory Given  Plan Discussed with: Anesthesiologist, CRNA and Surgeon  Anesthesia Plan Comments: (Patient consented for risks of anesthesia including but not limited to:  - adverse reactions to medications - damage to eyes, teeth, lips or other oral mucosa - nerve damage due to positioning  - sore throat or hoarseness - Damage to heart, brain, nerves, lungs, other parts of body or loss of life  Patient voiced understanding.)       Anesthesia Quick Evaluation

## 2023-03-20 NOTE — Transfer of Care (Signed)
Immediate Anesthesia Transfer of Care Note  Patient: Linda Brady  Procedure(s) Performed: CATARACT EXTRACTION PHACO AND INTRAOCULAR LENS PLACEMENT (IOC) LEFT VISION BLUE (Left: Eye)  Patient Location: PACU  Anesthesia Type: MAC  Level of Consciousness: awake, alert  and patient cooperative  Airway and Oxygen Therapy: Patient Spontanous Breathing and Patient connected to supplemental oxygen  Post-op Assessment: Post-op Vital signs reviewed, Patient's Cardiovascular Status Stable, Respiratory Function Stable, Patent Airway and No signs of Nausea or vomiting  Post-op Vital Signs: Reviewed and stable  Complications: No notable events documented.

## 2023-03-21 ENCOUNTER — Encounter: Payer: Self-pay | Admitting: Ophthalmology

## 2023-06-10 ENCOUNTER — Encounter: Payer: Self-pay | Admitting: Emergency Medicine

## 2023-06-10 ENCOUNTER — Emergency Department: Payer: Medicare Other

## 2023-06-10 ENCOUNTER — Other Ambulatory Visit: Payer: Self-pay

## 2023-06-10 ENCOUNTER — Emergency Department
Admission: EM | Admit: 2023-06-10 | Discharge: 2023-06-10 | Disposition: A | Discharge status: Home / Self Care | Payer: Medicare Other | Attending: Emergency Medicine | Admitting: Emergency Medicine

## 2023-06-10 DIAGNOSIS — R079 Chest pain, unspecified: Secondary | ICD-10-CM | POA: Insufficient documentation

## 2023-06-10 DIAGNOSIS — E039 Hypothyroidism, unspecified: Secondary | ICD-10-CM | POA: Insufficient documentation

## 2023-06-10 DIAGNOSIS — I10 Essential (primary) hypertension: Secondary | ICD-10-CM | POA: Insufficient documentation

## 2023-06-10 DIAGNOSIS — J441 Chronic obstructive pulmonary disease with (acute) exacerbation: Secondary | ICD-10-CM | POA: Insufficient documentation

## 2023-06-10 LAB — TSH: TSH: 1.008 u[IU]/mL (ref 0.350–4.500)

## 2023-06-10 LAB — BASIC METABOLIC PANEL WITH GFR
Anion gap: 12 (ref 5–15)
BUN: 14 mg/dL (ref 8–23)
CO2: 22 mmol/L (ref 22–32)
Calcium: 9.2 mg/dL (ref 8.9–10.3)
Chloride: 95 mmol/L — ABNORMAL LOW (ref 98–111)
Creatinine, Ser: 0.75 mg/dL (ref 0.44–1.00)
GFR, Estimated: >60 mL/min
Glucose, Bld: 118 mg/dL — ABNORMAL HIGH (ref 70–99)
Potassium: 4 mmol/L (ref 3.5–5.1)
Sodium: 129 mmol/L — ABNORMAL LOW (ref 135–145)

## 2023-06-10 LAB — CBC
HCT: 38.3 % (ref 36.0–46.0)
Hemoglobin: 12.5 g/dL (ref 12.0–15.0)
MCH: 29.2 pg (ref 26.0–34.0)
MCHC: 32.6 g/dL (ref 30.0–36.0)
MCV: 89.5 fL (ref 80.0–100.0)
Platelets: 395 10*3/uL (ref 150–400)
RBC: 4.28 MIL/uL (ref 3.87–5.11)
RDW: 15.3 % (ref 11.5–15.5)
WBC: 5.7 10*3/uL (ref 4.0–10.5)
nRBC: 0 % (ref 0.0–0.2)

## 2023-06-10 LAB — TROPONIN I (HIGH SENSITIVITY)
Troponin I (High Sensitivity): 27 ng/L — ABNORMAL HIGH (ref <18)
Troponin I (High Sensitivity): 30 ng/L — ABNORMAL HIGH

## 2023-06-10 LAB — D-DIMER, QUANTITATIVE: D-Dimer, Quant: 0.54 ug/mL-FEU — ABNORMAL HIGH (ref 0.00–0.50)

## 2023-06-10 LAB — T4, FREE: Free T4: 0.93 ng/dL (ref 0.61–1.12)

## 2023-06-10 MED ORDER — LORAZEPAM 1 MG PO TABS
1.0000 mg | ORAL_TABLET | Freq: Once | ORAL | Status: AC
  Administered 2023-06-10: 1 mg via ORAL
  Filled 2023-06-10: qty 1

## 2023-06-10 MED ORDER — PREDNISONE 50 MG PO TABS: ORAL_TABLET | ORAL | 0 refills | Status: DC

## 2023-06-10 MED ORDER — ACETAMINOPHEN 500 MG PO TABS
1000.0000 mg | ORAL_TABLET | Freq: Once | ORAL | Status: AC
  Administered 2023-06-10: 1000 mg via ORAL
  Filled 2023-06-10: qty 2

## 2023-06-10 MED ORDER — IPRATROPIUM-ALBUTEROL 0.5-2.5 (3) MG/3ML IN SOLN
3.0000 mL | Freq: Once | RESPIRATORY_TRACT | Status: AC
  Administered 2023-06-10: 3 mL via RESPIRATORY_TRACT
  Filled 2023-06-10: qty 3

## 2023-06-10 MED ORDER — PREDNISONE 20 MG PO TABS
60.0000 mg | ORAL_TABLET | Freq: Once | ORAL | Status: AC
  Administered 2023-06-10: 60 mg via ORAL
  Filled 2023-06-10: qty 3

## 2023-06-10 NOTE — ED Triage Notes (Signed)
Pt here via [facility] from home with cp. Pt states pain started at 0800. Pt has hx of COPD and wears oxygen PRN. Pt had SOB at rest, cp described as pressure, non radiating, central and constant. Pt received a 0.4mg  nitroglycerin spray by ems and 324 mg of asa. Pt also took her anxiety medication. 20 G L wrist.   174/90 93 SR 98% 2L

## 2023-06-10 NOTE — ED Provider Triage Note (Signed)
Emergency Medicine Provider Triage Evaluation Note  Linda Brady , a 82 y.o. female  was evaluated in triage.  Pt complains of chest pain and anxiety since about 8am. Some improvement after taking her anxiety medication. No nausea or diaphoresis.  Physical Exam  BP (!) 142/89 (BP Location: Left Arm)   Pulse 100   Temp 98.7 F (37.1 C) (Oral)   Resp 18   Ht 5\' 3"  (1.6 m)   Wt 81.6 kg   SpO2 93%   BMI 31.87 kg/m  Gen:   Awake, no distress   Resp:  Normal effort  MSK:   Moves extremities without difficulty  Other:    Medical Decision Making  Medically screening exam initiated at 2:25 PM.  Appropriate orders placed.  Linda Brady was informed that the remainder of the evaluation will be completed by another provider, this initial triage assessment does not replace that evaluation, and the importance of remaining in the ED until their evaluation is complete.    Chinita Pester, FNP 06/10/23 1426

## 2023-06-10 NOTE — Discharge Instructions (Signed)
Fortunately your testing in the emergency department showed no signs of dangerous conditions like heart attack or blood clot.  Use your albuterol inhaler 2 puffs every 4-6 hours as needed for wheezing or shortness breath.  Take prednisone for 4 days as prescribed.  Call your doctor for follow-up appointment this week.   Thank you for choosing [place] for your health care today!  Please see your primary doctor this week for a follow up appointment.   If you have any new, worsening, or unexpected symptoms call your doctor right away or come back to the emergency department for reevaluation.  It was my pleasure to care for you today.   Daneil Dan Modesto Charon, MD

## 2023-06-10 NOTE — ED Provider Notes (Signed)
[facility] Provider Note    Event Date/Time   First MD Initiated Contact with Patient 06/10/23 1557     (approximate)   History   Chest Pain   HPI  Linda Brady is a 82 y.o. female   Past medical history of COPD, GERD, hypertension, hypothyroid presents to the emergency department with several days of dry cough and then this morning developed exertional chest pain.  She awoke this morning in her regular state of health and walk to the kitchen developed chest heaviness and tightness.  Also shortness of breath with exertion.  Mostly resolved now.  She feels very anxious.  She denies fever or chills.  She has COPD and wears oxygen only as needed.  She is no longer smoking.  She denies any GI or GU complaints.  No current chest pain.  No leg symptoms.  Independent Historian contributed to assessment above: Her daughter is at bedside to corroborate information given above       Physical Exam   Triage Vital Signs: ED Triage Vitals  Enc Vitals Group     BP 06/10/23 1413 (!) 142/89     Pulse Rate 06/10/23 1413 100     Resp 06/10/23 1413 18     Temp 06/10/23 1413 98.7 F (37.1 C)     Temp Source 06/10/23 1413 Oral     SpO2 06/10/23 1413 93 %     Weight 06/10/23 1415 179 lb 14.3 oz (81.6 kg)     Height 06/10/23 1415 5\' 3"  (1.6 m)     Head Circumference --      Peak Flow --      Pain Score 06/10/23 1415 6     Pain Loc --      Pain Edu? --      Excl. in GC? --     Most recent vital signs: Vitals:   06/10/23 1413  BP: (!) 142/89  Pulse: 100  Resp: 18  Temp: 98.7 F (37.1 C)  SpO2: 93%    General: Awake, no distress.  CV:  Good peripheral perfusion.  Resp:  Normal effort.  Abd:  No distention.  Other:  Anxious appearing woman with a heart rate 100 otherwise vital signs within normal limits, no hypoxemia on room air.  Lungs with very scant wheezing.  No respiratory distress, breathing comfortably.  Appears euvolemic.   ED Results /  Procedures / Treatments   Labs (all labs ordered are listed, but only abnormal results are displayed) Labs Reviewed  BASIC METABOLIC PANEL - Abnormal; Notable for the following components:      Result Value   Sodium 129 (*)    Chloride 95 (*)    Glucose, Bld 118 (*)    All other components within normal limits  TROPONIN I (HIGH SENSITIVITY) - Abnormal; Notable for the following components:   Troponin I (High Sensitivity) 27 (*)    All other components within normal limits  CBC  TSH  T4, FREE  D-DIMER, QUANTITATIVE  TROPONIN I (HIGH SENSITIVITY)     I ordered and reviewed the above labs they are notable for initial troponin is 27.  Her sodium is 129 which is unchanged from her prior testing earlier this year.  EKG  ED ECG REPORT I, Pilar Jarvis, the attending physician, personally viewed and interpreted this ECG.   Date: 06/10/2023  EKG Time: 1419  Rate: 94  Rhythm: sinus  Axis: nl  Intervals:none  ST&T Change: no stemi  RADIOLOGY I independently reviewed and interpreted chest x-ray and I see no obvious focality or pneumothorax   PROCEDURES:  Critical Care performed: No  Procedures   MEDICATIONS ORDERED IN ED: Medications  LORazepam (ATIVAN) tablet 1 mg (has no administration in time range)  ipratropium-albuterol (DUONEB) 0.5-2.5 (3) MG/3ML nebulizer solution 3 mL (has no administration in time range)  acetaminophen (TYLENOL) tablet 1,000 mg (has no administration in time range)  predniSONE (DELTASONE) tablet 60 mg (has no administration in time range)    IMPRESSION / MDM / ASSESSMENT AND PLAN / ED COURSE  I reviewed the triage vital signs and the nursing notes.                                Patient's presentation is most consistent with acute presentation with potential threat to life or bodily function.  Differential diagnosis includes, but is not limited to, COPD exacerbation, ACS, PE, bacterial pneumonia, viral URI   The patient is on the  cardiac monitor to evaluate for evidence of arrhythmia and/or significant heart rate changes.  MDM:    Patient with COPD who looks stable but anxious, here with exertional chest pain and shortness of breath earlier today.  This is in the setting of a dry cough.  She has some wheezing suggestive of COPD exacerbation, given a DuoNeb.  Given her exertional chest pain consider ACS, EKG nonischemic and initial troponin is 27, will trend serial troponins.  Considered PE but less likely given evidence of COPD exacerbation, low clinical suspicion however given her tachycardia, will obtain D-dimer.  Patient anxious about her condition, asking for anxiety medications will give her Ativan p.o.  History of thyroid disease will check TSH and T4 given her tachycardia and high anxiety state to assess for thyrotoxicosis.   Plan for the above workup and if unremarkable with stable serial troponins and a negative VTE workup, plan will be for discharge and treat COPD exacerbation with PMD follow-up.       FINAL CLINICAL IMPRESSION(S) / ED DIAGNOSES   Final diagnoses:  Nonspecific chest pain  COPD with acute exacerbation (HCC)     Rx / DC Orders   ED Discharge Orders     None        Note:  This document was prepared using Dragon voice recognition software and may include unintentional dictation errors.    Pilar Jarvis, MD 06/10/23 [phone]

## 2023-09-18 ENCOUNTER — Emergency Department: Payer: Medicare Other

## 2023-09-18 ENCOUNTER — Other Ambulatory Visit: Payer: Self-pay

## 2023-09-18 ENCOUNTER — Other Ambulatory Visit (HOSPITAL_COMMUNITY): Payer: Self-pay

## 2023-09-18 ENCOUNTER — Observation Stay
Admission: EM | Admit: 2023-09-18 | Discharge: 2023-09-19 | Disposition: A | Discharge status: Home Health | Payer: Medicare Other | Attending: Osteopathic Medicine | Admitting: Osteopathic Medicine

## 2023-09-18 DIAGNOSIS — I10 Essential (primary) hypertension: Secondary | ICD-10-CM | POA: Diagnosis not present

## 2023-09-18 DIAGNOSIS — Z79899 Other long term (current) drug therapy: Secondary | ICD-10-CM | POA: Diagnosis not present

## 2023-09-18 DIAGNOSIS — Z23 Encounter for immunization: Secondary | ICD-10-CM | POA: Insufficient documentation

## 2023-09-18 DIAGNOSIS — J441 Chronic obstructive pulmonary disease with (acute) exacerbation: Secondary | ICD-10-CM | POA: Diagnosis not present

## 2023-09-18 DIAGNOSIS — I2510 Atherosclerotic heart disease of native coronary artery without angina pectoris: Secondary | ICD-10-CM | POA: Insufficient documentation

## 2023-09-18 DIAGNOSIS — R0602 Shortness of breath: Secondary | ICD-10-CM | POA: Diagnosis present

## 2023-09-18 DIAGNOSIS — Z87891 Personal history of nicotine dependence: Secondary | ICD-10-CM | POA: Diagnosis not present

## 2023-09-18 DIAGNOSIS — E039 Hypothyroidism, unspecified: Secondary | ICD-10-CM | POA: Insufficient documentation

## 2023-09-18 DIAGNOSIS — Z1152 Encounter for screening for COVID-19: Secondary | ICD-10-CM | POA: Diagnosis not present

## 2023-09-18 DIAGNOSIS — I480 Paroxysmal atrial fibrillation: Principal | ICD-10-CM | POA: Diagnosis present

## 2023-09-18 DIAGNOSIS — Z7982 Long term (current) use of aspirin: Secondary | ICD-10-CM | POA: Diagnosis not present

## 2023-09-18 DIAGNOSIS — I4891 Unspecified atrial fibrillation: Principal | ICD-10-CM | POA: Diagnosis present

## 2023-09-18 LAB — URINALYSIS, ROUTINE W REFLEX MICROSCOPIC
Bacteria, UA: NONE SEEN
Bilirubin Urine: NEGATIVE
Glucose, UA: NEGATIVE mg/dL
Ketones, ur: NEGATIVE mg/dL
Leukocytes,Ua: NEGATIVE
Nitrite: NEGATIVE
Protein, ur: NEGATIVE mg/dL
Specific Gravity, Urine: 1.003 — ABNORMAL LOW (ref 1.005–1.030)
Squamous Epithelial / HPF: 0 /[HPF] (ref 0–5)
pH: 8 (ref 5.0–8.0)

## 2023-09-18 LAB — COMPREHENSIVE METABOLIC PANEL
ALT: 12 U/L (ref 0–44)
AST: 19 U/L (ref 15–41)
Albumin: 4.1 g/dL (ref 3.5–5.0)
Alkaline Phosphatase: 73 U/L (ref 38–126)
Anion gap: 11 (ref 5–15)
BUN: 13 mg/dL (ref 8–23)
CO2: 25 mmol/L (ref 22–32)
Calcium: 9.6 mg/dL (ref 8.9–10.3)
Chloride: 95 mmol/L — ABNORMAL LOW (ref 98–111)
Creatinine, Ser: 0.71 mg/dL (ref 0.44–1.00)
GFR, Estimated: >60 mL/min (ref >60)
Glucose, Bld: 120 mg/dL — ABNORMAL HIGH (ref 70–99)
Potassium: 3.5 mmol/L (ref 3.5–5.1)
Sodium: 131 mmol/L — ABNORMAL LOW (ref 135–145)
Total Bilirubin: 0.7 mg/dL (ref 0.3–1.2)
Total Protein: 7.9 g/dL (ref 6.5–8.1)

## 2023-09-18 LAB — CBC WITH DIFFERENTIAL/PLATELET
Abs Immature Granulocytes: 0.02 10*3/uL (ref 0.00–0.07)
Basophils Absolute: 0.1 10*3/uL (ref 0.0–0.1)
Basophils Relative: 1 %
Eosinophils Absolute: 0.2 10*3/uL (ref 0.0–0.5)
Eosinophils Relative: 3 %
HCT: 40.5 % (ref 36.0–46.0)
Hemoglobin: 13.5 g/dL (ref 12.0–15.0)
Immature Granulocytes: 0 %
Lymphocytes Relative: 18 %
Lymphs Abs: 1.4 10*3/uL (ref 0.7–4.0)
MCH: 30.5 pg (ref 26.0–34.0)
MCHC: 33.3 g/dL (ref 30.0–36.0)
MCV: 91.6 fL (ref 80.0–100.0)
Monocytes Absolute: 0.8 10*3/uL (ref 0.1–1.0)
Monocytes Relative: 10 %
Neutro Abs: 5.5 10*3/uL (ref 1.7–7.7)
Neutrophils Relative %: 68 %
Platelets: 420 10*3/uL — ABNORMAL HIGH (ref 150–400)
RBC: 4.42 MIL/uL (ref 3.87–5.11)
RDW: 13.2 % (ref 11.5–15.5)
WBC: 8 10*3/uL (ref 4.0–10.5)
nRBC: 0 % (ref 0.0–0.2)

## 2023-09-18 LAB — LIPASE, BLOOD: Lipase: 34 U/L (ref 11–51)

## 2023-09-18 LAB — TROPONIN I (HIGH SENSITIVITY)
Troponin I (High Sensitivity): 24 ng/L — ABNORMAL HIGH (ref <18)
Troponin I (High Sensitivity): 45 ng/L — ABNORMAL HIGH (ref <18)

## 2023-09-18 LAB — TSH: TSH: 1.152 u[IU]/mL (ref 0.350–4.500)

## 2023-09-18 LAB — MAGNESIUM: Magnesium: 1.9 mg/dL (ref 1.7–2.4)

## 2023-09-18 LAB — T4, FREE: Free T4: 1.24 ng/dL — ABNORMAL HIGH (ref 0.61–1.12)

## 2023-09-18 LAB — SARS CORONAVIRUS 2 BY RT PCR: SARS Coronavirus 2 by RT PCR: NEGATIVE

## 2023-09-18 LAB — BRAIN NATRIURETIC PEPTIDE: B Natriuretic Peptide: 161.8 pg/mL — ABNORMAL HIGH (ref 0.0–100.0)

## 2023-09-18 MED ORDER — ONDANSETRON HCL 4 MG/2ML IJ SOLN: 4.0000 mg | Freq: Four times a day (QID) | INTRAMUSCULAR | Status: DC | PRN

## 2023-09-18 MED ORDER — PANTOPRAZOLE SODIUM 40 MG PO TBEC
40.0000 mg | DELAYED_RELEASE_TABLET | Freq: Two times a day (BID) | ORAL | Status: DC
  Administered 2023-09-18 – 2023-09-19 (×3): 40 mg via ORAL
  Filled 2023-09-18 (×3): qty 1

## 2023-09-18 MED ORDER — POTASSIUM CHLORIDE CRYS ER 20 MEQ PO TBCR
40.0000 meq | EXTENDED_RELEASE_TABLET | Freq: Once | ORAL | Status: AC
  Administered 2023-09-18: 40 meq via ORAL
  Filled 2023-09-18: qty 2

## 2023-09-18 MED ORDER — DILTIAZEM HCL 25 MG/5ML IV SOLN
20.0000 mg | Freq: Once | INTRAVENOUS | Status: AC
  Administered 2023-09-18: 20 mg via INTRAVENOUS
  Filled 2023-09-18: qty 5

## 2023-09-18 MED ORDER — METOPROLOL TARTRATE 5 MG/5ML IV SOLN: 5.0000 mg | INTRAVENOUS | Status: DC | PRN

## 2023-09-18 MED ORDER — ALPRAZOLAM 0.25 MG PO TABS
0.2500 mg | ORAL_TABLET | Freq: Every evening | ORAL | Status: DC | PRN
  Administered 2023-09-18: 0.25 mg via ORAL
  Filled 2023-09-18: qty 1

## 2023-09-18 MED ORDER — ALPRAZOLAM 0.25 MG PO TABS
0.2500 mg | ORAL_TABLET | ORAL | Status: AC
  Administered 2023-09-18: 0.25 mg via ORAL
  Filled 2023-09-18: qty 1

## 2023-09-18 MED ORDER — PANTOPRAZOLE SODIUM 40 MG PO TBEC: 40.0000 mg | DELAYED_RELEASE_TABLET | Freq: Every day | ORAL | Status: DC

## 2023-09-18 MED ORDER — ASPIRIN 81 MG PO TBEC
81.0000 mg | DELAYED_RELEASE_TABLET | Freq: Every day | ORAL | Status: DC
  Administered 2023-09-18 – 2023-09-19 (×2): 81 mg via ORAL
  Filled 2023-09-18 (×2): qty 1

## 2023-09-18 MED ORDER — IPRATROPIUM-ALBUTEROL 0.5-2.5 (3) MG/3ML IN SOLN
3.0000 mL | Freq: Once | RESPIRATORY_TRACT | Status: AC
  Administered 2023-09-18: 3 mL via RESPIRATORY_TRACT
  Filled 2023-09-18: qty 3

## 2023-09-18 MED ORDER — ACETAMINOPHEN 650 MG RE SUPP: 650.0000 mg | Freq: Four times a day (QID) | RECTAL | Status: DC | PRN

## 2023-09-18 MED ORDER — DILTIAZEM HCL-DEXTROSE 125-5 MG/125ML-% IV SOLN (PREMIX)
5.0000 mg/h | INTRAVENOUS | Status: DC
  Administered 2023-09-18: 5 mg/h via INTRAVENOUS
  Filled 2023-09-18: qty 125

## 2023-09-18 MED ORDER — FUROSEMIDE 10 MG/ML IJ SOLN
20.0000 mg | Freq: Once | INTRAMUSCULAR | Status: AC
  Administered 2023-09-18: 20 mg via INTRAVENOUS
  Filled 2023-09-18: qty 4

## 2023-09-18 MED ORDER — ISOSORBIDE MONONITRATE ER 30 MG PO TB24
30.0000 mg | ORAL_TABLET | Freq: Every day | ORAL | Status: DC
  Administered 2023-09-18 – 2023-09-19 (×2): 30 mg via ORAL
  Filled 2023-09-18 (×2): qty 1

## 2023-09-18 MED ORDER — IPRATROPIUM BROMIDE 0.02 % IN SOLN: 0.5000 mg | RESPIRATORY_TRACT | Status: DC

## 2023-09-18 MED ORDER — ROPINIROLE HCL 0.25 MG PO TABS: 0.2500 mg | ORAL_TABLET | Freq: Every day | ORAL | Status: DC

## 2023-09-18 MED ORDER — BUTALBITAL-APAP-CAFFEINE 50-325-40 MG PO TABS
1.0000 | ORAL_TABLET | Freq: Once | ORAL | Status: AC
  Administered 2023-09-18: 1 via ORAL
  Filled 2023-09-18: qty 1

## 2023-09-18 MED ORDER — METHYLPREDNISOLONE SODIUM SUCC 125 MG IJ SOLR
125.0000 mg | Freq: Once | INTRAMUSCULAR | Status: AC
  Administered 2023-09-18: 125 mg via INTRAVENOUS
  Filled 2023-09-18: qty 2

## 2023-09-18 MED ORDER — PREDNISONE 20 MG PO TABS
40.0000 mg | ORAL_TABLET | Freq: Every day | ORAL | Status: DC
  Administered 2023-09-18 – 2023-09-19 (×2): 40 mg via ORAL
  Filled 2023-09-18 (×2): qty 2

## 2023-09-18 MED ORDER — TRAZODONE HCL 50 MG PO TABS
25.0000 mg | ORAL_TABLET | Freq: Every evening | ORAL | Status: DC | PRN
  Administered 2023-09-18: 25 mg via ORAL
  Filled 2023-09-18: qty 1

## 2023-09-18 MED ORDER — ENALAPRIL MALEATE 10 MG PO TABS
20.0000 mg | ORAL_TABLET | Freq: Two times a day (BID) | ORAL | Status: DC
  Administered 2023-09-18 – 2023-09-19 (×3): 20 mg via ORAL
  Filled 2023-09-18 (×4): qty 2
  Filled 2023-09-18: qty 1

## 2023-09-18 MED ORDER — ACETAMINOPHEN 500 MG PO TABS
1000.0000 mg | ORAL_TABLET | Freq: Once | ORAL | Status: AC
  Administered 2023-09-18: 1000 mg via ORAL
  Filled 2023-09-18: qty 2

## 2023-09-18 MED ORDER — METOPROLOL TARTRATE 50 MG PO TABS: 50.0000 mg | ORAL_TABLET | Freq: Two times a day (BID) | ORAL | Status: DC

## 2023-09-18 MED ORDER — PNEUMOCOCCAL 20-VAL CONJ VACC 0.5 ML IM SUSY
0.5000 mL | PREFILLED_SYRINGE | INTRAMUSCULAR | Status: AC
  Administered 2023-09-19: 0.5 mL via INTRAMUSCULAR
  Filled 2023-09-18: qty 0.5

## 2023-09-18 MED ORDER — LEVOTHYROXINE SODIUM 75 MCG PO TABS
75.0000 ug | ORAL_TABLET | Freq: Every day | ORAL | Status: DC
  Administered 2023-09-19: 75 ug via ORAL
  Filled 2023-09-18: qty 1
  Filled 2023-09-18: qty 3

## 2023-09-18 MED ORDER — BUDESONIDE-FORMOTEROL FUMARATE 160-4.5 MCG/ACT IN AERO: 2.0000 | INHALATION_SPRAY | Freq: Two times a day (BID) | RESPIRATORY_TRACT | 12 refills | Status: DC

## 2023-09-18 MED ORDER — ONDANSETRON HCL 4 MG PO TABS: 4.0000 mg | ORAL_TABLET | Freq: Four times a day (QID) | ORAL | Status: DC | PRN

## 2023-09-18 MED ORDER — DIGOXIN 0.25 MG/ML IJ SOLN
0.1250 mg | Freq: Once | INTRAMUSCULAR | Status: DC
  Filled 2023-09-18: qty 2

## 2023-09-18 MED ORDER — APIXABAN 5 MG PO TABS
5.0000 mg | ORAL_TABLET | Freq: Two times a day (BID) | ORAL | Status: DC
  Administered 2023-09-18 – 2023-09-19 (×3): 5 mg via ORAL
  Filled 2023-09-18 (×3): qty 1

## 2023-09-18 MED ORDER — DILTIAZEM HCL 30 MG PO TABS
30.0000 mg | ORAL_TABLET | Freq: Four times a day (QID) | ORAL | Status: DC
  Administered 2023-09-18 – 2023-09-19 (×6): 30 mg via ORAL
  Filled 2023-09-18 (×6): qty 1

## 2023-09-18 MED ORDER — FLUOXETINE HCL 20 MG PO CAPS
20.0000 mg | ORAL_CAPSULE | Freq: Every day | ORAL | Status: DC
  Administered 2023-09-18 – 2023-09-19 (×2): 20 mg via ORAL
  Filled 2023-09-18 (×2): qty 1

## 2023-09-18 MED ORDER — LEVALBUTEROL HCL 0.63 MG/3ML IN NEBU
0.6300 mg | INHALATION_SOLUTION | Freq: Four times a day (QID) | RESPIRATORY_TRACT | Status: DC
  Administered 2023-09-18 – 2023-09-19 (×4): 0.63 mg via RESPIRATORY_TRACT
  Filled 2023-09-18 (×4): qty 3

## 2023-09-18 MED ORDER — BUDESONIDE 0.25 MG/2ML IN SUSP
0.2500 mg | Freq: Two times a day (BID) | RESPIRATORY_TRACT | Status: DC
  Administered 2023-09-18 – 2023-09-19 (×3): 0.25 mg via RESPIRATORY_TRACT
  Filled 2023-09-18 (×3): qty 2

## 2023-09-18 MED ORDER — INFLUENZA VAC A&B SURF ANT ADJ 0.5 ML IM SUSY
0.5000 mL | PREFILLED_SYRINGE | INTRAMUSCULAR | Status: AC
  Administered 2023-09-19: 0.5 mL via INTRAMUSCULAR
  Filled 2023-09-18: qty 0.5

## 2023-09-18 MED ORDER — SIMVASTATIN 20 MG PO TABS
10.0000 mg | ORAL_TABLET | Freq: Every day | ORAL | Status: DC
  Administered 2023-09-18 – 2023-09-19 (×2): 10 mg via ORAL
  Filled 2023-09-18 (×2): qty 1

## 2023-09-18 MED ORDER — IPRATROPIUM BROMIDE 0.02 % IN SOLN
2.5000 mL | Freq: Four times a day (QID) | RESPIRATORY_TRACT | Status: DC | PRN
  Administered 2023-09-19: 0.5 mg via RESPIRATORY_TRACT
  Filled 2023-09-18: qty 2.5

## 2023-09-18 MED ORDER — ROPINIROLE HCL 0.25 MG PO TABS
0.2500 mg | ORAL_TABLET | Freq: Three times a day (TID) | ORAL | Status: DC
  Administered 2023-09-18 – 2023-09-19 (×4): 0.25 mg via ORAL
  Filled 2023-09-18 (×5): qty 1

## 2023-09-18 MED ORDER — ACETAMINOPHEN 325 MG PO TABS
650.0000 mg | ORAL_TABLET | Freq: Four times a day (QID) | ORAL | Status: DC | PRN
  Administered 2023-09-18 (×2): 650 mg via ORAL
  Filled 2023-09-18 (×2): qty 2

## 2023-09-18 NOTE — Progress Notes (Signed)
PHARMACY - ANTICOAGULATION CONSULT NOTE  Pharmacy Consult for Apixaban Indication: atrial fibrillation  Allergies  Allergen Reactions   Naproxen Sodium Anaphylaxis    Patient Measurements:    Vital Signs: Temp: 97.5 F (36.4 C) (10/17 0754) Temp Source: Oral (10/17 0352) BP: 135/69 (10/17 1000) Pulse Rate: 77 (10/17 1000)  Labs: Recent Labs    09/18/23 0402 09/18/23 0548  HGB 13.5  --   HCT 40.5  --   PLT 420*  --   CREATININE 0.71  --   TROPONINIHS 24* 45*    CrCl cannot be calculated (Unknown ideal weight.).   Medical History: Past Medical History:  Diagnosis Date   COPD (chronic obstructive pulmonary disease) (HCC)    GERD (gastroesophageal reflux disease)    Hypertension    Hypothyroidism    Thyroid disease    Assessment: Patient is a 82yo female admitted for palpitations found to be in afib w/RVR. Pharmacy consulted for apixaban dosing.   Plan:  Will order Apixaban 5mg  twice daily. Follow labs per protocol.  Copay investigation: $107.23  Clovia Cuff, PharmD, BCPS 09/18/2023 10:32 AM

## 2023-09-18 NOTE — ED Triage Notes (Signed)
Pt called [facility] from home. C/o SOB worsening since yesterday. No relief with home inhalers. Wheeze noted throughout lung fields. Pt on 4L [place] on arrival. Pt breathing unlabored on arrival and speaking in full sentences. EMS reports afib en route with HR 130-140's. No known hx of same. Pt denies chest pain. Reports palpitations. Pt alert and oriented. MD at bedside.

## 2023-09-18 NOTE — TOC Initial Note (Signed)
Transition of Care [facility]) - Initial/Assessment Note    Patient Details  Name: Linda Brady MRN: [id number] Date of Birth: [birth year]  [facility] CM/SW Contact:    Marquita Palms, LCSW Phone Number: 09/18/2023, 4:11 PM  Clinical Narrative:                  CSW met with patient at bedside. She reported that she lives alone. She reports she uses [facility] on [address] for her pharmacy, and she has never had the need for walker or any DME at her home. No other needs at this time.       Patient Goals and CMS Choice            Expected Discharge Plan and Services                                              Prior Living Arrangements/Services                       Activities of Daily Living      Permission Sought/Granted                  Emotional Assessment              Admission diagnosis:  Paroxysmal atrial fibrillation with RVR (HCC) [I48.0] Atrial fibrillation with RVR (HCC) [I48.91] Afib (HCC) [I48.91] Patient Active Problem List   Diagnosis Date Noted   Paroxysmal atrial fibrillation with RVR (HCC) 09/18/2023   Atrial fibrillation with RVR (HCC) 09/18/2023   Afib (HCC) 09/18/2023   Abdominal pain 12/05/2022   Shortness of breath 12/05/2022   Pneumonia due to COVID-19 virus 04/12/2022   Lactic acidosis 04/12/2022   Osteoporosis, 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   Chronic heart failure with preserved ejection fraction (HFpEF) (HCC) 12/16/2021   Elevated troponin 12/15/2021   Anxiety 12/15/2021   Coronary artery disease involving native coronary artery of native heart    Aortic stenosis, moderate    COPD with acute exacerbation (HCC) 12/14/2021   Hyponatremia 12/14/2021   Dyslipidemia 12/14/2021   GERD (gastroesophageal reflux disease) 12/14/2021   Vitamin B12 deficiency 12/14/2021   S/P cardiac catheterization 09/19/2020   Chest pain 09/10/2020   Hypokalemia 09/10/2020   Unstable angina (HCC)  09/10/2020   Hypothyroidism    COPD (chronic obstructive pulmonary disease) (HCC)    Essential hypertension    PCP:  [name], MD Pharmacy:   [facility] [address], [place] - [address] [place] [address] Phone: [phone] Fax: [phone]     Social Determinants of Health (SDOH) Social History: SDOH Screenings   Food Insecurity: Patient Declined (06/26/2023)   Received from [facility]  Transportation Needs: Patient Declined (06/26/2023)   Received from [facility]  Utilities: Patient Declined (06/26/2023)   Received from [facility]  Financial Resource Strain: Patient Declined (06/26/2023)   Received from [facility]  Tobacco Use: Medium Risk (09/18/2023)   SDOH Interventions:     Readmission Risk Interventions     No data to display

## 2023-09-18 NOTE — ED Notes (Signed)
Pt called out requesting to go to toilet. RN assisted pt with ambulating to toilet in room. Upon getting pt back into bed and on monitor, pt HR noted to be 160. HR now between 112-130. HR previously controlled. MD notified.

## 2023-09-18 NOTE — Progress Notes (Signed)
Nurse reported that the patient heart rate broke back to normal sinus rhythm.  EKG confirmed patient is in normal sinus rhythm.  Went back to check patient, patient appears to be volume overloaded with bilateral crackles and some JVD of 5-6 cm above clavicle, 1 dose of Lasix given.  Discontinue Cardizem drip, start patient on Cardizem 30 mg every 6 hours, likely can switch to XL form tomorrow when ready to go home.  Start Eliquis twice daily.  Sent Symbicort to [employer].  Case was discussed with daughter over the phone.

## 2023-09-18 NOTE — H&P (Addendum)
History and Physical    Linda Brady:[id number] DOB: 1941-06-15 DOA: 09/18/2023  PCP: [name], MD (Confirm with patient/family/NH records and if not entered, this has to be entered at [facility] point of entry) Patient coming from: Home  I have personally briefly reviewed patient's old medical records in [facility] Link  Chief Complaint: Wheezing, SOB  HPI: Linda Brady is a 82 y.o. female with medical history significant of nonobstructive CAD, COPD Gold stage II, HTN, hypothyroidism, restless leg syndrome, anxiety/depression, presented with worsening of palpitations.  Patient started to have dry cough wheezing about 7 days ago, which she attributed to COPD flareup and she has been using around-the-clock Combivent nebulizing machine as well as the albuterol pump achieve some relief.  3 days ago she started to have strong feeling of palpitations and the feeling has been constant worsening with exertions associated with worsening of shortness of breath.  She felt the palpitation comes from she has not been able to sleep well because of the poorly controlled restless leg syndrome, for which she has been taking aspirin and Xanax with little help.  No chest pain no fever or chills.  ED Course: Heart rate in the 150s and EKG showed rapid A-fib, O2 saturation 93% on room air.  Checks x-ray showed no acute infiltrates.  Blood work showed K3.5, creatinine 2.7, T4 1.2, and patient was given Cardizem IV push x 1 and started on Cardizem drip.  Review of Systems: As per HPI otherwise 14 point review of systems negative.    Past Medical History:  Diagnosis Date   COPD (chronic obstructive pulmonary disease) (HCC)    GERD (gastroesophageal reflux disease)    Hypertension    Hypothyroidism    Thyroid disease     Past Surgical History:  Procedure Laterality Date   BREAST CYST ASPIRATION Left 10 plus yrs   benign-twice   CATARACT EXTRACTION W/PHACO Left 03/20/2023   Procedure:  CATARACT EXTRACTION PHACO AND INTRAOCULAR LENS PLACEMENT (IOC) LEFT VISION BLUE;  Surgeon: Estanislado Pandy, MD;  Location: [facility];  Service: Ophthalmology;  Laterality: Left;  21.02   01:38.2   LEFT HEART CATH AND CORONARY ANGIOGRAPHY N/A 09/11/2020   Procedure: LEFT HEART CATH AND CORONARY ANGIOGRAPHY;  Surgeon: Marcina Millard, MD;  Location: [facility] INVASIVE CV LAB;  Service: Cardiovascular;  Laterality: N/A;     reports that she has quit smoking. Her smoking use included cigarettes. She has a 6 pack-year smoking history. She has never used smokeless tobacco. She reports that she does not currently use alcohol. She reports that she does not currently use drugs.  Allergies  Allergen Reactions   Naproxen Sodium Anaphylaxis    Family History  Problem Relation Age of Onset   Breast cancer Neg Hx      Prior to Admission medications   Medication Sig Start Date End Date Taking? Authorizing Provider  albuterol (VENTOLIN HFA) 108 (90 Base) MCG/ACT inhaler Inhale 2 puffs into the lungs every 4 (four) hours as needed for wheezing or shortness of breath. 12/16/21  Yes Pennie Banter, DO  ALPRAZolam Prudy Feeler) 0.25 MG tablet Take 0.25 mg by mouth at bedtime as needed.    [provider]  aspirin 81 MG EC tablet Take 81 mg by mouth daily.    [provider]  busPIRone (BUSPAR) 5 MG tablet Take 0.5 tablets (2.5 mg total) by mouth 2 (two) times daily. Patient not taking: Reported on 03/12/2023 12/05/22   Chinita Pester, FNP  cholecalciferol (VITAMIN D3) 25 MCG (1000 UNIT) tablet Take 1,000 Units by mouth daily.    [provider]  enalapril (VASOTEC) 20 MG tablet Take 1 tablet (20 mg total) by mouth 2 (two) times daily. 09/10/22   Furth, Cadence H, PA-C  FLUoxetine (PROZAC) 20 MG capsule Take 20 mg by mouth daily. 03/14/20   [provider]  hydrochlorothiazide (HYDRODIURIL) 25 MG tablet Take 0.5 tablets (12.5 mg total) by mouth daily. 09/10/22    Furth, Cadence H, PA-C  hydrOXYzine (ATARAX/VISTARIL) 25 MG tablet Take 25 mg by mouth 3 (three) times daily as needed for anxiety. 12/13/19   [provider]  Ipratropium-Albuterol (COMBIVENT) 20-100 MCG/ACT AERS respimat Inhale 1 puff into the lungs 4 (four) times daily. 04/14/22   Darlin Priestly, MD  isosorbide mononitrate (IMDUR) 30 MG 24 hr tablet Take 1 tablet (30 mg total) by mouth daily. 10/14/22   Furth, Cadence H, PA-C  levothyroxine (SYNTHROID) 75 MCG tablet Take 75 mcg by mouth daily. 05/02/20   [provider]  predniSONE (DELTASONE) 50 MG tablet Take 1 tablet daily for the next 4 days starting 06/11/2023 Patient not taking: Reported on 09/18/2023 06/11/23   Pilar Jarvis, MD  RABEprazole (ACIPHEX) 20 MG tablet Take 1 tablet (20 mg) by mouth once daily    [provider]  simvastatin (ZOCOR) 20 MG tablet Take 10 mg by mouth daily. 04/02/21   [provider]  vitamin B-12 (CYANOCOBALAMIN) 1000 MCG tablet Take 1,000 mcg by mouth daily.    [provider]    Physical Exam: Vitals:   09/18/23 0636 09/18/23 0700 09/18/23 0750 09/18/23 0754  BP:  129/84 (!) 137/97   Pulse: (!) 115 (!) 104 (!) 138   Resp: (!) 22 (!) 21 (!) 26   Temp:    (!) 97.5 F (36.4 C)  TempSrc:      SpO2: 95% 94% 93%     Constitutional: NAD, calm, comfortable Vitals:   09/18/23 0636 09/18/23 0700 09/18/23 0750 09/18/23 0754  BP:  129/84 (!) 137/97   Pulse: (!) 115 (!) 104 (!) 138   Resp: (!) 22 (!) 21 (!) 26   Temp:    (!) 97.5 F (36.4 C)  TempSrc:      SpO2: 95% 94% 93%    Eyes: PERRL, lids and conjunctivae normal ENMT: Mucous membranes are moist. Posterior pharynx clear of any exudate or lesions.Normal dentition.  Neck: normal, supple, no masses, no thyromegaly Respiratory: clear to auscultation bilaterally, scattered wheezing, no crackles.  Increasing respiratory effort. No accessory muscle use.  Cardiovascular: Irregular heart rate and tachycardia, soft systolic  murmur. No extremity edema. 2+ pedal pulses. No carotid bruits.  Abdomen: no tenderness, no masses palpated. No hepatosplenomegaly. Bowel sounds positive.  Musculoskeletal: no clubbing / cyanosis. No joint deformity upper and lower extremities. Good ROM, no contractures. Normal muscle tone.  Skin: no rashes, lesions, ulcers. No induration Neurologic: CN 2-12 grossly intact. Sensation intact, DTR normal. Strength 5/5 in all 4.  Psychiatric: Normal judgment and insight. Alert and oriented x 3. Normal mood.     Labs on Admission: I have personally reviewed following labs and imaging studies  CBC: Recent Labs  Lab 09/18/23 0402  WBC 8.0  NEUTROABS 5.5  HGB 13.5  HCT 40.5  MCV 91.6  PLT 420*   Basic Metabolic Panel: Recent Labs  Lab 09/18/23 0402  NA 131*  K 3.5  CL 95*  CO2 25  GLUCOSE 120*  BUN 13  CREATININE 0.71  CALCIUM 9.6  MG 1.9   GFR: CrCl cannot be calculated (Unknown ideal weight.). Liver Function Tests: Recent Labs  Lab 09/18/23 0402  AST 19  ALT 12  ALKPHOS 73  BILITOT 0.7  PROT 7.9  ALBUMIN 4.1   Recent Labs  Lab 09/18/23 0402  LIPASE 34   No results for input(s): "AMMONIA" in the last 168 hours. Coagulation Profile: No results for input(s): "INR", "PROTIME" in the last 168 hours. Cardiac Enzymes: No results for input(s): "CKTOTAL", "CKMB", "CKMBINDEX", "TROPONINI" in the last 168 hours. BNP (last 3 results) No results for input(s): "PROBNP" in the last 8760 hours. HbA1C: No results for input(s): "HGBA1C" in the last 72 hours. CBG: No results for input(s): "GLUCAP" in the last 168 hours. Lipid Profile: No results for input(s): "CHOL", "HDL", "LDLCALC", "TRIG", "CHOLHDL", "LDLDIRECT" in the last 72 hours. Thyroid Function Tests: Recent Labs    09/18/23 0402  TSH 1.152  FREET4 1.24*   Anemia Panel: No results for input(s): "VITAMINB12", "FOLATE", "FERRITIN", "TIBC", "IRON", "RETICCTPCT" in the last 72 hours. Urine analysis:     Component Value Date/Time   COLORURINE COLORLESS (A) 09/18/2023 0634   APPEARANCEUR CLEAR (A) 09/18/2023 0634   APPEARANCEUR Hazy 11/12/2012 1355   LABSPEC 1.003 (L) 09/18/2023 0634   LABSPEC 1.005 11/12/2012 1355   PHURINE 8.0 09/18/2023 0634   GLUCOSEU NEGATIVE 09/18/2023 0634   GLUCOSEU Negative 11/12/2012 1355   HGBUR SMALL (A) 09/18/2023 0634   BILIRUBINUR NEGATIVE 09/18/2023 0634   BILIRUBINUR Negative 11/12/2012 1355   KETONESUR NEGATIVE 09/18/2023 0634   PROTEINUR NEGATIVE 09/18/2023 0634   NITRITE NEGATIVE 09/18/2023 0634   LEUKOCYTESUR NEGATIVE 09/18/2023 0634   LEUKOCYTESUR Trace 11/12/2012 1355    Radiological Exams on Admission: DG Chest Portable 1 View  Result Date: 09/18/2023 CLINICAL DATA:  COPD exacerbation. Atrial fibrillation. Worsening shortness of breath since yesterday with no relief with home inhalers. EXAM: PORTABLE CHEST 1 VIEW COMPARISON:  AP Lat chest 06/10/2023 FINDINGS: The lungs are emphysematous but clear of infiltrates. There are linear scar-like opacities in both bases. There is mild cardiomegaly, mild central vascular prominence without overt edema. Stable mediastinum with aortic tortuosity and calcific plaques. Osteopenia. No new osseous findings. IMPRESSION: 1. No evidence of acute chest disease. 2. COPD. 3. Aortic atherosclerosis and uncoiling. Electronically Signed   By: Almira Bar M.D.   On: 09/18/2023 06:13    EKG: Independently reviewed.  A-fib with RVR, no acute ST changes.  Assessment/Plan Principal Problem:   Paroxysmal atrial fibrillation with RVR (HCC) Active Problems:   Atrial fibrillation with RVR (HCC)   Afib (HCC)  (please populate well all problems here in Problem List. (For example, if patient is on BP meds at home and you resume or decide to hold them, it is a problem that needs to be her. Same for CAD, COPD, HLD and so on)  A-fib with RVR -Continue Cardizem drip, start p.o. metoprolol 50 mg twice daily to titrate Cardizem  drip.  Add as needed Lopressor for breakthrough heart rate.  Clinically patient also has symptoms signs of CHF probably secondary to rapid A-fib, for this, 1 dose of IV digoxin given. -CHADS2=2, no Hx of GI bleed, or stroke, will start Eliquis. -TSH normal -K=3.5, 1 dose of KCl given.  Magnesium=1.9  Acute COPD exacerbation -Improving after IV Solu-Medrol given, start p.o. prednisone x 5 days -Pulmicort -Xopenex nebulization every 6 hours plus as needed Atrovent  Hyponatremia -Hold off hydrochlorothiazide  HTN -Hold off home BP meds including HCTZ,  ACEI and Imdur, as patient is on Cardizem drip and metoprolol p.o. right now  Restless leg syndrome -Trial of ropinirole  Epigastric pain -Patient complaining about for last 3+ month she has been feeling cramping-like epigastric pain, usually after she ate meals, several times a week, occasionally happen at night.  She is status postcholecystectomy but never had GI workup.  Clinically suspect she has peptic ulcer and I increased her PPI to twice daily and recommend she follow-up with GI for outpatient EGD.   DVT prophylaxis: Eliquis Code Status: Full code Family Communication: Daughter over the phone Disposition Plan: Expect less than 2 midnight hospital stay Consults called: None Admission status: PCU   Emeline General MD [facility] Pager [phone]  09/18/2023, 8:11 AM

## 2023-09-18 NOTE — Evaluation (Signed)
Physical Therapy Evaluation Patient Details Name: Linda Brady MRN: [id number] DOB: February 23, 1941 Today's Date: 09/18/2023  History of Present Illness  Pt is a 82 y.o. female presenting to ED on 09/18/23 for progressive worsening SOB,  palpitations and minimally productive cough.  No syncope, falls, or fevers.  Reports chronic abdominal pain since the cholecystectomy years ago.  No emesis, stool changes.  Clinical Impression  Pt is received in bed, she is agreeable to PT session. At baseline, Pt reports amb without AD with no recent falls, assistance for ADL/IADLs from daughter but self-feeds, use of 2L [place] O2 PRN due to COPD dx. Pt performs bed mobility, transfers, and amb close supA for safety. Pt able to amb approx 10 ft using RW with no reports of dizziness/SOB and suggesting she could work without RW. VSS monitored throughout with HR maintaining below 101 bpm and SpO2 91-96% on RA. Educated Pt on upcoming performance of bed exercises for BLE to promote strength-Pt verbalized understanding. Pt would benefit from skilled PT to address above deficits and promote optimal return to PLOF.    If plan is discharge home, recommend the following: A little help with walking and/or transfers;A little help with bathing/dressing/bathroom;Assist for transportation;Help with stairs or ramp for entrance;Assistance with cooking/housework   Can travel by private vehicle        Equipment Recommendations Other (comment) (TBD at this time)  Recommendations for Other Services       Functional Status Assessment Patient has had a recent decline in their functional status and demonstrates the ability to make significant improvements in function in a reasonable and predictable amount of time.     Precautions / Restrictions Precautions Precautions: Fall Restrictions Weight Bearing Restrictions: No      Mobility  Bed Mobility Overal bed mobility: Needs Assistance Bed Mobility: Supine to Sit, Sit to  Supine     Supine to sit: Supervision, Used rails, HOB elevated Sit to supine: Supervision, Used rails   General bed mobility comments: able to perform bed mobility with close supA for safety; use of bed railings    Transfers Overall transfer level: Needs assistance Equipment used: Rolling walker (2 wheels) Transfers: Sit to/from Stand Sit to Stand: Supervision           General transfer comment: able to perform STS using RW without cuing for hand placement and no LOB noted    Ambulation/Gait Ambulation/Gait assistance: Supervision Gait Distance (Feet): 10 Feet Assistive device: Rolling walker (2 wheels) Gait Pattern/deviations: WFL(Within Functional Limits), Step-through pattern, Decreased stride length Gait velocity: Decreased     General Gait Details: able to amb approx 10 ft using RW with mild gait impairments; no reports of SOB  Stairs            Wheelchair Mobility     Tilt Bed    Modified Rankin (Stroke Patients Only)       Balance Overall balance assessment: No apparent balance deficits (not formally assessed)                                           Pertinent Vitals/Pain Pain Assessment Pain Assessment: No/denies pain    Home Living Family/patient expects to be discharged to:: Private residence Living Arrangements: Alone Available Help at Discharge: Family;Available PRN/intermittently Type of Home: House (Duplex) Home Access: Stairs to enter Entrance Stairs-Rails: Right;Left;Can reach both Entrance Stairs-Number of Steps: 6  Home Layout: One level Home Equipment: Shower seat      Prior Function Prior Level of Function : Independent/Modified Independent             Mobility Comments: amb without AD and recently gave up driving due to cataract surgery on L eye ADLs Comments: mod I with assist with sponge bathing but otherwise daughter comes and helps with housekeeping     Extremity/Trunk Assessment   Upper  Extremity Assessment Upper Extremity Assessment: Overall WFL for tasks assessed    Lower Extremity Assessment Lower Extremity Assessment: Generalized weakness       Communication   Communication Communication: No apparent difficulties Cueing Techniques: Verbal cues  Cognition Arousal: Alert Behavior During Therapy: WFL for tasks assessed/performed Overall Cognitive Status: Within Functional Limits for tasks assessed                                 General Comments: Pleasant and cooperative with PT session        General Comments General comments (skin integrity, edema, etc.): Able to maintain seated and standing static and dynamic balance without BUE support and no notable swaying    Exercises Other Exercises Other Exercises: Educated Pt on performing ankle pumps to maintain BLE strengthening and circulation   Assessment/Plan    PT Assessment Patient needs continued PT services  PT Problem List Decreased strength;Decreased range of motion;Decreased activity tolerance;Decreased balance;Decreased mobility       PT Treatment Interventions DME instruction;Gait training;Stair training;Functional mobility training;Therapeutic activities;Therapeutic exercise    PT Goals (Current goals can be found in the Care Plan section)  Acute Rehab PT Goals Patient Stated Goal: to go home PT Goal Formulation: With patient Time For Goal Achievement: 10/02/23 Potential to Achieve Goals: Good    Frequency Min 1X/week     Co-evaluation               AM-PAC PT "6 Clicks" Mobility  Outcome Measure Help needed turning from your back to your side while in a flat bed without using bedrails?: None Help needed moving from lying on your back to sitting on the side of a flat bed without using bedrails?: None Help needed moving to and from a bed to a chair (including a wheelchair)?: A Little Help needed standing up from a chair using your arms (e.g., wheelchair or bedside  chair)?: A Little Help needed to walk in hospital room?: A Little Help needed climbing 3-5 steps with a railing? : A Lot 6 Click Score: 19    End of Session   Activity Tolerance: Patient tolerated treatment well Patient left: in bed;with call bell/phone within reach;with bed alarm set Nurse Communication: Mobility status PT Visit Diagnosis: Unsteadiness on feet (R26.81);Difficulty in walking, not elsewhere classified (R26.2);Muscle weakness (generalized) (M62.81)    Time: 2130-8657 PT Time Calculation (min) (ACUTE ONLY): 11 min   Charges:                 Elmon Else, SPT   Nema Oatley 09/18/2023, 3:34 PM

## 2023-09-18 NOTE — ED Notes (Signed)
Assisted patient to the restroom x1 assist with wheelchair and back.

## 2023-09-18 NOTE — TOC Benefit Eligibility Note (Signed)
Patient Product/process development scientist completed.    The patient is insured through [employer].     Ran test claim for Eliquis 5 mg and the current 30 day co-pay is $107.23.   This test claim was processed through [facility]- copay amounts may vary at other pharmacies due to pharmacy/plan contracts, or as the patient moves through the different stages of their insurance plan.     Linda Brady, CPHT Pharmacy Technician III Certified Patient Advocate [facility] Patient Advocate Team Direct Number: [phone]  Fax: [phone]

## 2023-09-18 NOTE — ED Provider Notes (Signed)
[facility] Provider Note    Event Date/Time   First MD Initiated Contact with Patient 09/18/23 [phone]     (approximate)   History   Shortness of Breath   HPI  Linda Brady is a 82 y.o. female who presents to the ED for evaluation of Shortness of Breath   Review of PCP visit from 2 months ago.  History of HTN, HLD, COPD.  She presents to the ED via EMS from home for evaluation of about 2 or 3 days of progressively worsening shortness of breath, palpitations and minimally productive cough.  No syncope, falls, or fevers.  Reports chronic abdominal pain since the cholecystectomy years ago.  No emesis, stool changes  Physical Exam   Triage Vital Signs: ED Triage Vitals  Encounter Vitals Group     BP 09/18/23 0353 (!) 136/108     Systolic BP Percentile --      Diastolic BP Percentile --      Pulse Rate 09/18/23 0352 (!) 153     Resp 09/18/23 0352 (!) 21     Temp 09/18/23 0352 97.8 F (36.6 C)     Temp Source 09/18/23 0352 Oral     SpO2 09/18/23 0352 96 %     Weight --      Height --      Head Circumference --      Peak Flow --      Pain Score 09/18/23 0351 0     Pain Loc --      Pain Education --      Exclude from Growth Chart --     Most recent vital signs: Vitals:   09/18/23 0430 09/18/23 0500  BP: (!) 145/71 (!) 140/73  Pulse: 92 (!) 102  Resp: (!) 21 18  Temp:    SpO2: 97% 91%    General: Awake, no distress.  Seems uncomfortable, hard of hearing CV:  Good peripheral perfusion.  Tachycardic and irregular Resp:  Mild tachypnea to the low 20s, no distress.  Wheezing throughout with slightly decreased airflow. Abd:  No distention.  Soft, bilateral upper abdominal mild tenderness without guarding or peritoneal features MSK:  No deformity noted.  Neuro:  No focal deficits appreciated. Other:     ED Results / Procedures / Treatments   Labs (all labs ordered are listed, but only abnormal results are displayed) Labs Reviewed   COMPREHENSIVE METABOLIC PANEL - Abnormal; Notable for the following components:      Result Value   Sodium 131 (*)    Chloride 95 (*)    Glucose, Bld 120 (*)    All other components within normal limits  CBC WITH DIFFERENTIAL/PLATELET - Abnormal; Notable for the following components:   Platelets 420 (*)    All other components within normal limits  T4, FREE - Abnormal; Notable for the following components:   Free T4 1.24 (*)    All other components within normal limits  TROPONIN I (HIGH SENSITIVITY) - Abnormal; Notable for the following components:   Troponin I (High Sensitivity) 24 (*)    All other components within normal limits  SARS CORONAVIRUS 2 BY RT PCR  MAGNESIUM  LIPASE, BLOOD  TSH  BRAIN NATRIURETIC PEPTIDE  URINALYSIS, ROUTINE W REFLEX MICROSCOPIC  TROPONIN I (HIGH SENSITIVITY)    EKG A-fib with RVR, rate of 152 bpm.  Normal axis and intervals.  No STEMI.  RADIOLOGY CXR interpreted by me without evidence of acute cardiopulmonary pathology.  Official radiology report(s):  No results found.  PROCEDURES and INTERVENTIONS:  .1-3 Lead EKG Interpretation  Performed by: Delton Prairie, MD Authorized by: Delton Prairie, MD     Interpretation: abnormal     ECG rate:  150   ECG rate assessment: tachycardic     Rhythm: atrial fibrillation     Ectopy: none     Conduction: normal   .Critical Care  Performed by: Delton Prairie, MD Authorized by: Delton Prairie, MD   Critical care provider statement:    Critical care time (minutes):  30   Critical care time was exclusive of:  Separately billable procedures and treating other patients   Critical care was necessary to treat or prevent imminent or life-threatening deterioration of the following conditions:  Circulatory failure, respiratory failure and cardiac failure   Critical care was time spent personally by me on the following activities:  Development of treatment plan with patient or surrogate, discussions with  consultants, evaluation of patient's response to treatment, examination of patient, ordering and review of laboratory studies, ordering and review of radiographic studies, ordering and performing treatments and interventions, pulse oximetry, re-evaluation of patient's condition and review of old charts   Medications  ipratropium-albuterol (DUONEB) 0.5-2.5 (3) MG/3ML nebulizer solution 3 mL (3 mLs Nebulization Given 09/18/23 0400)  methylPREDNISolone sodium succinate (SOLU-MEDROL) 125 mg/2 mL injection 125 mg (125 mg Intravenous Given 09/18/23 0358)  diltiazem (CARDIZEM) injection 20 mg (20 mg Intravenous Given 09/18/23 0402)     IMPRESSION / MDM / ASSESSMENT AND PLAN / ED COURSE  I reviewed the triage vital signs and the nursing notes.  Differential diagnosis includes, but is not limited to, ACS, PTX, PNA, muscle strain/spasm, PE, dissection, anxiety, pleural effusion  {Patient presents with symptoms of an acute illness or injury that is potentially life-threatening.  Patient with known COPD presents to the ED with evidence of an exacerbation as well as new onset A-fib requiring medical admission.  Presents in A-fib with RVR but hemodynamically stable.  Rates and symptoms improved with an IV diltiazem bolus.  Wheezing improved with breathing treatments.  Nonischemic EKG with new A-fib.  Troponins are mildly elevated likely rate related.  Normal CBC and reassuring metabolic panel, COVID test, lipase and electrolytes.  Consult with medicine for admission  Clinical Course as of 09/18/23 0532  Thu Sep 18, 2023  1610 Reassessed, feeling better and wheezing has improved on auscultation.  Remains in A-fib on the monitor but with decreased rates in the 90s. [DS]    Clinical Course User Index [DS] Delton Prairie, MD     FINAL CLINICAL IMPRESSION(S) / ED DIAGNOSES   Final diagnoses:  Atrial fibrillation with RVR (HCC)  New onset atrial fibrillation (HCC)  COPD exacerbation (HCC)     Rx / DC  Orders   ED Discharge Orders     None        Note:  This document was prepared using Dragon voice recognition software and may include unintentional dictation errors.   Delton Prairie, MD 09/18/23 [phone]

## 2023-09-18 NOTE — ED Notes (Signed)
IP MD at bedside. EKG given to MD Zhang. Pt RA sats 93-94% on Ra and endorses SOB. Pt placed on 2L [place].

## 2023-09-18 NOTE — ED Notes (Signed)
Lab called stating urine specimen sent was insufficient. RN to recollect when pt able to void next

## 2023-09-18 NOTE — ED Notes (Signed)
Pt ambulatory to toliet in room at this time. Pt independent in ambulating. Pt denies dizziness, lightheadedness, or SOB during ambulation.

## 2023-09-18 NOTE — ED Notes (Addendum)
IP MD at bedside. MD made aware of cardiac rhythm conversion from Afib to NSR. Pt SPO2 97% on 2L, RN discontinued O2. RA sat 94%

## 2023-09-19 ENCOUNTER — Observation Stay (HOSPITAL_BASED_OUTPATIENT_CLINIC_OR_DEPARTMENT_OTHER)
Admit: 2023-09-19 | Discharge: 2023-09-19 | Disposition: A | Discharge status: Home / Self Care | Payer: Medicare Other | Attending: Osteopathic Medicine | Admitting: Osteopathic Medicine

## 2023-09-19 DIAGNOSIS — I4891 Unspecified atrial fibrillation: Secondary | ICD-10-CM | POA: Diagnosis not present

## 2023-09-19 DIAGNOSIS — I480 Paroxysmal atrial fibrillation: Secondary | ICD-10-CM | POA: Diagnosis not present

## 2023-09-19 LAB — ECHOCARDIOGRAM COMPLETE
AR max vel: 1.03 cm2
AV Area VTI: 0.99 cm2
AV Area mean vel: 0.97 cm2
AV Mean grad: 49.2 mm[Hg]
AV Peak grad: 83.1 mm[Hg]
Ao pk vel: 4.56 m/s
Area-P 1/2: 2.9 cm2
Height: 64 in
MV VTI: 2.62 cm2
P 1/2 time: 436 ms
S' Lateral: 2.4 cm
Weight: 2836 [oz_av]

## 2023-09-19 MED ORDER — DILTIAZEM HCL ER COATED BEADS 120 MG PO CP24: 120.0000 mg | ORAL_CAPSULE | Freq: Every day | ORAL | 0 refills | Status: DC

## 2023-09-19 MED ORDER — ATORVASTATIN CALCIUM 20 MG PO TABS: 40.0000 mg | ORAL_TABLET | Freq: Every day | ORAL | Status: DC

## 2023-09-19 MED ORDER — PANTOPRAZOLE SODIUM 40 MG PO TBEC: 40.0000 mg | DELAYED_RELEASE_TABLET | Freq: Two times a day (BID) | ORAL | 0 refills | Status: DC

## 2023-09-19 MED ORDER — APIXABAN 5 MG PO TABS: 5.0000 mg | ORAL_TABLET | Freq: Two times a day (BID) | ORAL | 0 refills | Status: DC

## 2023-09-19 MED ORDER — ATORVASTATIN CALCIUM 40 MG PO TABS: 40.0000 mg | ORAL_TABLET | Freq: Every day | ORAL | 0 refills | Status: AC

## 2023-09-19 MED ORDER — LEVALBUTEROL HCL 0.63 MG/3ML IN NEBU: 0.6300 mg | INHALATION_SOLUTION | Freq: Two times a day (BID) | RESPIRATORY_TRACT | Status: DC

## 2023-09-19 MED ORDER — PREDNISONE 20 MG PO TABS: 40.0000 mg | ORAL_TABLET | Freq: Every day | ORAL | 0 refills | Status: DC

## 2023-09-19 NOTE — Hospital Course (Signed)
HPI: Linda Brady is a 82 y.o. female with medical history significant of nonobstructive CAD, COPD Gold stage II, HTN, hypothyroidism, restless leg syndrome, anxiety/depression, presented to ED from home via EMS with SOB/cough x7d and palpitations x3d.   Hospital course / significant events:  10/17: to ED, (+)Afib, new. Cardizem IV push, converted NSR. Admitted to hospitalist for COPD exacerbation and new Afib. Started Eliquis. Concern for fluid overload, given Lasix.  10/18: Echo ordered. ***  Consultants:  ***  Procedures/Surgeries: ***      ASSESSMENT & PLAN:       *** based on BMI: Body mass index is 30.42 kg/m.  Underweight - under 18.5  normal weight - 18.5 to 24.9 overweight - 25 to 29.9 obese - 30 or more   DVT prophylaxis: *** IV fluids: *** continuous IV fluids  Nutrition: *** Central lines / invasive devices: ***  Code Status: *** ACP documentation reviewed: *** none on file in [facility]  TOC needs: *** Barriers to dispo / significant pending items: ***

## 2023-09-19 NOTE — Care Management Obs Status (Signed)
MEDICARE OBSERVATION STATUS NOTIFICATION   Patient Details  Name: Linda Brady MRN: [id number] Date of Birth: [birth year]   Medicare Observation Status Notification Given:  Yes    Truddie Hidden, RN 09/19/2023, 1:00 PM

## 2023-09-19 NOTE — Plan of Care (Signed)
  Problem: Clinical Measurements: Goal: Postoperative complications will be avoided or minimized Outcome: Progressing   Problem: Respiratory: Goal: Will regain and/or maintain adequate ventilation Outcome: Progressing   Problem: Health Behavior/Discharge Planning: Goal: Ability to manage health-related needs will improve Outcome: Progressing   Problem: Clinical Measurements: Goal: Ability to maintain clinical measurements within normal limits will improve Outcome: Progressing

## 2023-09-19 NOTE — Plan of Care (Signed)
CHL Tonsillectomy/Adenoidectomy, Postoperative PEDS care plan entered in error.

## 2023-09-19 NOTE — TOC Progression Note (Signed)
Transition of Care [facility]) - Progression Note    Patient Details  Name: Linda Brady MRN: [id number] Date of Birth: 1941-07-10  [facility] CM/SW Contact  Truddie Hidden, RN Phone Number: 09/19/2023, 1:00 PM  Clinical Narrative:    Spoke with patient and family member at bedside regarding therapy recommendation for HHPT. Patient is agreeable and does not have a choice of an agency. She was reluctant to agree and has been advised she can cancel services at any time.  Referral sent to Coralee [facility] from [facility].  Request for RW sent to Jon from [employer].        Expected Discharge Plan and Services                                               Social Determinants of Health (SDOH) Interventions SDOH Screenings   Food Insecurity: No Food Insecurity (09/18/2023)  Housing: Low Risk  (09/18/2023)  Transportation Needs: No Transportation Needs (09/18/2023)  Utilities: Not At Risk (09/18/2023)  Financial Resource Strain: Patient Declined (06/26/2023)   Received from [facility]  Tobacco Use: Medium Risk (09/18/2023)    Readmission Risk Interventions     No data to display

## 2023-09-19 NOTE — Progress Notes (Signed)
Physical Therapy Treatment Patient Details Name: Linda Brady MRN: [id number] DOB: 1941/07/25 Today's Date: 09/19/2023   History of Present Illness Pt is a 82 y.o. female presenting to ED on 09/18/23 for progressive worsening SOB,  palpitations and minimally productive cough.  No syncope, falls, or fevers.  Reports chronic abdominal pain since the cholecystectomy years ago.  No emesis, stool changes.    PT Comments  Pt is received in bed, she is agreeable to PT session. Pt performs bed mobility supA, transfers CGA, and amb supA for safety. Pt on RA throughout session with mild desat of SpO2 85% during standing break following toileting, but able to recover with pursed lip breathing exercises to SpO2 92%. Pt able to amb approx 100 ft using RW on RA with SpO2% 93 throughout. Pt demonstrated 1 occurrence of LOB during amb activity secondary to L head turn but able to self-correct. Pt in recliner at end of session with reapplication of 2L [place] per Pt request due to reports of slight SOB, SpO2 92%. Overall, Pt demonstrates steady improvement towards PT goals but would benefit from cont skilled PT to address above deficits and promote optimal return to PLOF.    If plan is discharge home, recommend the following: A little help with walking and/or transfers;A little help with bathing/dressing/bathroom;Assist for transportation;Help with stairs or ramp for entrance;Assistance with cooking/housework   Can travel by Training and development officer (2 wheels)    Recommendations for Other Services       Precautions / Restrictions Precautions Precautions: Fall Restrictions Weight Bearing Restrictions: No     Mobility  Bed Mobility Overal bed mobility: Needs Assistance Bed Mobility: Supine to Sit     Supine to sit: Supervision     General bed mobility comments: able to perform bed mobility close supA for safety but otherwise mod I    Transfers Overall  transfer level: Needs assistance Equipment used: None Transfers: Sit to/from Stand Sit to Stand: Contact guard assist           General transfer comment: able to perform STS without AD CGA; brief use of bed railing to initiate STS with min unsteadiness observed    Ambulation/Gait Ambulation/Gait assistance: Supervision Gait Distance (Feet): 100 Feet Assistive device: Rolling walker (2 wheels) Gait Pattern/deviations: WFL(Within Functional Limits), Step-through pattern, Decreased stride length Gait velocity: Decreased     General Gait Details: able to amb approx 100 ft using RW with 1 occurence of LOB during head turn to L side although able to self-correct; slight reports of SOB towards end of session although SpO2 97% on RA   Stairs             Wheelchair Mobility     Tilt Bed    Modified Rankin (Stroke Patients Only)       Balance Overall balance assessment: No apparent balance deficits (not formally assessed)                                          Cognition Arousal: Alert Behavior During Therapy: WFL for tasks assessed/performed Overall Cognitive Status: Within Functional Limits for tasks assessed                                 General Comments: Pleasant and motivated  to work with PT        Exercises Other Exercises Other Exercises: Toileting; close supA for peri-care but otherwise mod I    General Comments General comments (skin integrity, edema, etc.): able to maintain seated and standing balance without BUE support      Pertinent Vitals/Pain Pain Assessment Pain Assessment: No/denies pain    Home Living                          Prior Function            PT Goals (current goals can now be found in the care plan section) Acute Rehab PT Goals Patient Stated Goal: to go home PT Goal Formulation: With patient Time For Goal Achievement: 10/02/23 Potential to Achieve Goals: Good Progress  towards PT goals: Progressing toward goals    Frequency    Min 1X/week      PT Plan      Co-evaluation              AM-PAC PT "6 Clicks" Mobility   Outcome Measure  Help needed turning from your back to your side while in a flat bed without using bedrails?: None Help needed moving from lying on your back to sitting on the side of a flat bed without using bedrails?: None Help needed moving to and from a bed to a chair (including a wheelchair)?: A Little Help needed standing up from a chair using your arms (e.g., wheelchair or bedside chair)?: A Little Help needed to walk in hospital room?: A Little Help needed climbing 3-5 steps with a railing? : A Little 6 Click Score: 20    End of Session Equipment Utilized During Treatment: Gait belt Activity Tolerance: Patient tolerated treatment well Patient left: with call bell/phone within reach;in chair;with chair alarm set Nurse Communication: Mobility status PT Visit Diagnosis: Unsteadiness on feet (R26.81);Difficulty in walking, not elsewhere classified (R26.2);Muscle weakness (generalized) (M62.81)     Time: 7829-5621 PT Time Calculation (min) (ACUTE ONLY): 21 min  Charges:                            Linda Brady, SPT    Linda Brady 09/19/2023, 10:51 AM

## 2023-09-19 NOTE — Progress Notes (Signed)
*  PRELIMINARY RESULTS* Echocardiogram 2D Echocardiogram has been performed.  Linda Brady 09/19/2023, 2:58 PM

## 2023-09-19 NOTE — Discharge Summary (Signed)
Physician Discharge Summary   Patient: Linda Brady MRN: [id number]  DOB: December 08, 1940   Admit:     Date of Admission: 09/18/2023 Admitted from: home   Discharge: Date of discharge: 09/19/23 Disposition: Home health Condition at discharge: good  CODE STATUS: FULL CODE     Discharge Physician: Linda Nielsen, DO [facility]     PCP: Linda Mina, MD  Recommendations for Outpatient Follow-up:  Follow up with PCP Linda Mina, MD in 2-4 weeks Follow up with [facility] next week - should et a call     Discharge Instructions     Diet - low sodium heart healthy   Complete by: As directed    Increase activity slowly   Complete by: As directed          Discharge Diagnoses: Principal Problem:   Paroxysmal atrial fibrillation with RVR (HCC) Active Problems:   Atrial fibrillation with RVR (HCC)   Afib (HCC)        HPI: Linda Brady is a 82 y.o. female with medical history significant of nonobstructive CAD, COPD Gold stage II, HTN, hypothyroidism, restless leg syndrome, anxiety/depression, presented to ED from home via EMS with SOB/cough x7d and palpitations x3d.   Hospital course / significant events:  10/17: to ED, (+)Afib, new. Cardizem IV push, converted NSR. Admitted to hospitalist for COPD exacerbation and new Afib. Started Eliquis. Concern for fluid overload, given Lasix.  10/18: Echo ordered, no concerns. Rate remains controlled. Spoke w/ Linda Brady who will arrange follow up w/ [facility]   Consultants:  none  Procedures/Surgeries: none      ASSESSMENT & PLAN:   A-fib with RVR - resolved Now sinus rhythm  CHADS2=2, no Hx of GI bleed, or stroke, will start Eliquis, stop ASA Echo no concerns Follow w/ cardiology next week    Acute COPD exacerbation Improving after IV Solu-Medrol given, start p.o. prednisone x 5 days Continue home inhalers    Hyponatremia Hold off hydrochlorothiazide   HTN D/c  hydrochlorothiazide, starting diltiazem long acting Continue other meds as below    Epigastric pain Patient complaining about for last 3+ month she has been feeling cramping-like epigastric pain, usually after she ate meals, several times a week, occasionally happen at night.  She is status postcholecystectomy but never had GI workup.  Clinically suspect she has peptic ulcer, advised increased her PPI to twice daily and recommend she follow-up with GI for outpatient EGD.         Discharge Instructions  Allergies as of 09/19/2023       Reactions   Naproxen Sodium Anaphylaxis        Medication List     STOP taking these medications    aspirin EC 81 MG tablet   busPIRone 5 MG tablet Commonly known as: BUSPAR   simvastatin 20 MG tablet Commonly known as: ZOCOR       TAKE these medications    albuterol 108 (90 Base) MCG/ACT inhaler Commonly known as: VENTOLIN HFA Inhale 2 puffs into the lungs every 4 (four) hours as needed for wheezing or shortness of breath.   ALPRAZolam 0.25 MG tablet Commonly known as: XANAX Take 0.25 mg by mouth at bedtime as needed.   apixaban 5 MG Tabs tablet Commonly known as: ELIQUIS Take 1 tablet (5 mg total) by mouth 2 (two) times daily. Start PM 09/19/23   atorvastatin 40 MG tablet Commonly known as: LIPITOR Take 1 tablet (40 mg total) by mouth daily. Start  8.0   Microbiology Recent Results (from the past 240 hour(s))  SARS Coronavirus 2 by RT PCR (hospital order, performed in [facility] hospital lab) *cepheid single result test* Anterior Nasal Swab     Status: None   Collection Time: 09/18/23  4:02 AM   Specimen: Anterior Nasal Swab  Result Value Ref Range Status   SARS Coronavirus 2 by RT PCR NEGATIVE NEGATIVE Final    Comment: (NOTE) SARS-CoV-2 target nucleic acids are NOT DETECTED.  The SARS-CoV-2 RNA is generally detectable in upper and lower respiratory specimens during the acute phase of infection. The lowest concentration of SARS-CoV-2 viral copies this assay can detect is 250 copies / mL. A negative result does not preclude SARS-CoV-2 infection and should not be used as the sole basis for treatment or other patient management decisions.  A negative result may occur with improper specimen collection / handling, submission of specimen other than nasopharyngeal swab, presence of viral mutation(s) within the areas targeted by this assay, and inadequate number of viral copies (<250 copies / mL). A negative result must be combined with clinical observations, patient history, and epidemiological information.  Fact Sheet for Patients:   RoadLapTop.co.za  Fact Sheet for Healthcare Providers: http://kim-miller.com/  This test is not yet approved or  cleared by the [place] FDA and has been authorized for detection and/or diagnosis of SARS-CoV-2 by FDA under an Emergency Use Authorization (EUA).  This EUA will remain in effect (meaning this test can be used) for the duration of the COVID-19 declaration under  Section 564(b)(1) of the Act, 21 U.S.C. section 360bbb-3(b)(1), unless the authorization is terminated or revoked sooner.  Performed at [facility], [address] [place], [place] [address]    Imaging ECHOCARDIOGRAM COMPLETE  Result Date: 09/19/2023    ECHOCARDIOGRAM REPORT   Patient Name:   Linda Brady Date of Exam: 09/19/2023 Medical Rec #:  [id number]         Height:       64.0 in Accession #:    [id number]        Weight:       177.2 lb Date of Birth:  11-Jul-1941        BSA:          1.858 m Patient Age:    81 years          BP:           119/61 mmHg Patient Gender: F                 HR:           73 bpm. Exam Location:  [facility] Procedure: 2D Echo, Cardiac Doppler and Color Doppler Indications:     Atrial Fibrillaiton  History:         Patient has prior history of Echocardiogram examinations, most                  recent 12/15/2021. CHF, CAD and Angina, COPD, Arrythmias:Atrial                  Fibrillation, Signs/Symptoms:Chest Pain and Shortness of                  Breath; Risk Factors:Hypertension and Dyslipidemia.  Sonographer:     Mikki Harbor Referring Phys:  [id number] Linda Brady Diagnosing Phys: Linda Odea MD  Sonographer Comments: Image acquisition challenging due to respiratory motion. IMPRESSIONS  1. Left ventricular ejection fraction, by estimation, is 65 to  8.0   Microbiology Recent Results (from the past 240 hour(s))  SARS Coronavirus 2 by RT PCR (hospital order, performed in [facility] hospital lab) *cepheid single result test* Anterior Nasal Swab     Status: None   Collection Time: 09/18/23  4:02 AM   Specimen: Anterior Nasal Swab  Result Value Ref Range Status   SARS Coronavirus 2 by RT PCR NEGATIVE NEGATIVE Final    Comment: (NOTE) SARS-CoV-2 target nucleic acids are NOT DETECTED.  The SARS-CoV-2 RNA is generally detectable in upper and lower respiratory specimens during the acute phase of infection. The lowest concentration of SARS-CoV-2 viral copies this assay can detect is 250 copies / mL. A negative result does not preclude SARS-CoV-2 infection and should not be used as the sole basis for treatment or other patient management decisions.  A negative result may occur with improper specimen collection / handling, submission of specimen other than nasopharyngeal swab, presence of viral mutation(s) within the areas targeted by this assay, and inadequate number of viral copies (<250 copies / mL). A negative result must be combined with clinical observations, patient history, and epidemiological information.  Fact Sheet for Patients:   RoadLapTop.co.za  Fact Sheet for Healthcare Providers: http://kim-miller.com/  This test is not yet approved or  cleared by the [place] FDA and has been authorized for detection and/or diagnosis of SARS-CoV-2 by FDA under an Emergency Use Authorization (EUA).  This EUA will remain in effect (meaning this test can be used) for the duration of the COVID-19 declaration under  Section 564(b)(1) of the Act, 21 U.S.C. section 360bbb-3(b)(1), unless the authorization is terminated or revoked sooner.  Performed at [facility], [address] [place], [place] [address]    Imaging ECHOCARDIOGRAM COMPLETE  Result Date: 09/19/2023    ECHOCARDIOGRAM REPORT   Patient Name:   Linda Brady Date of Exam: 09/19/2023 Medical Rec #:  [id number]         Height:       64.0 in Accession #:    [id number]        Weight:       177.2 lb Date of Birth:  11-Jul-1941        BSA:          1.858 m Patient Age:    81 years          BP:           119/61 mmHg Patient Gender: F                 HR:           73 bpm. Exam Location:  [facility] Procedure: 2D Echo, Cardiac Doppler and Color Doppler Indications:     Atrial Fibrillaiton  History:         Patient has prior history of Echocardiogram examinations, most                  recent 12/15/2021. CHF, CAD and Angina, COPD, Arrythmias:Atrial                  Fibrillation, Signs/Symptoms:Chest Pain and Shortness of                  Breath; Risk Factors:Hypertension and Dyslipidemia.  Sonographer:     Mikki Harbor Referring Phys:  [id number] Linda Brady Diagnosing Phys: Linda Odea MD  Sonographer Comments: Image acquisition challenging due to respiratory motion. IMPRESSIONS  1. Left ventricular ejection fraction, by estimation, is 65 to  Physician Discharge Summary   Patient: Linda Brady MRN: [id number]  DOB: December 08, 1940   Admit:     Date of Admission: 09/18/2023 Admitted from: home   Discharge: Date of discharge: 09/19/23 Disposition: Home health Condition at discharge: good  CODE STATUS: FULL CODE     Discharge Physician: Linda Nielsen, DO [facility]     PCP: Linda Mina, MD  Recommendations for Outpatient Follow-up:  Follow up with PCP Linda Mina, MD in 2-4 weeks Follow up with [facility] next week - should et a call     Discharge Instructions     Diet - low sodium heart healthy   Complete by: As directed    Increase activity slowly   Complete by: As directed          Discharge Diagnoses: Principal Problem:   Paroxysmal atrial fibrillation with RVR (HCC) Active Problems:   Atrial fibrillation with RVR (HCC)   Afib (HCC)        HPI: Linda Brady is a 82 y.o. female with medical history significant of nonobstructive CAD, COPD Gold stage II, HTN, hypothyroidism, restless leg syndrome, anxiety/depression, presented to ED from home via EMS with SOB/cough x7d and palpitations x3d.   Hospital course / significant events:  10/17: to ED, (+)Afib, new. Cardizem IV push, converted NSR. Admitted to hospitalist for COPD exacerbation and new Afib. Started Eliquis. Concern for fluid overload, given Lasix.  10/18: Echo ordered, no concerns. Rate remains controlled. Spoke w/ Linda Brady who will arrange follow up w/ [facility]   Consultants:  none  Procedures/Surgeries: none      ASSESSMENT & PLAN:   A-fib with RVR - resolved Now sinus rhythm  CHADS2=2, no Hx of GI bleed, or stroke, will start Eliquis, stop ASA Echo no concerns Follow w/ cardiology next week    Acute COPD exacerbation Improving after IV Solu-Medrol given, start p.o. prednisone x 5 days Continue home inhalers    Hyponatremia Hold off hydrochlorothiazide   HTN D/c  hydrochlorothiazide, starting diltiazem long acting Continue other meds as below    Epigastric pain Patient complaining about for last 3+ month she has been feeling cramping-like epigastric pain, usually after she ate meals, several times a week, occasionally happen at night.  She is status postcholecystectomy but never had GI workup.  Clinically suspect she has peptic ulcer, advised increased her PPI to twice daily and recommend she follow-up with GI for outpatient EGD.         Discharge Instructions  Allergies as of 09/19/2023       Reactions   Naproxen Sodium Anaphylaxis        Medication List     STOP taking these medications    aspirin EC 81 MG tablet   busPIRone 5 MG tablet Commonly known as: BUSPAR   simvastatin 20 MG tablet Commonly known as: ZOCOR       TAKE these medications    albuterol 108 (90 Base) MCG/ACT inhaler Commonly known as: VENTOLIN HFA Inhale 2 puffs into the lungs every 4 (four) hours as needed for wheezing or shortness of breath.   ALPRAZolam 0.25 MG tablet Commonly known as: XANAX Take 0.25 mg by mouth at bedtime as needed.   apixaban 5 MG Tabs tablet Commonly known as: ELIQUIS Take 1 tablet (5 mg total) by mouth 2 (two) times daily. Start PM 09/19/23   atorvastatin 40 MG tablet Commonly known as: LIPITOR Take 1 tablet (40 mg total) by mouth daily. Start  8.0   Microbiology Recent Results (from the past 240 hour(s))  SARS Coronavirus 2 by RT PCR (hospital order, performed in [facility] hospital lab) *cepheid single result test* Anterior Nasal Swab     Status: None   Collection Time: 09/18/23  4:02 AM   Specimen: Anterior Nasal Swab  Result Value Ref Range Status   SARS Coronavirus 2 by RT PCR NEGATIVE NEGATIVE Final    Comment: (NOTE) SARS-CoV-2 target nucleic acids are NOT DETECTED.  The SARS-CoV-2 RNA is generally detectable in upper and lower respiratory specimens during the acute phase of infection. The lowest concentration of SARS-CoV-2 viral copies this assay can detect is 250 copies / mL. A negative result does not preclude SARS-CoV-2 infection and should not be used as the sole basis for treatment or other patient management decisions.  A negative result may occur with improper specimen collection / handling, submission of specimen other than nasopharyngeal swab, presence of viral mutation(s) within the areas targeted by this assay, and inadequate number of viral copies (<250 copies / mL). A negative result must be combined with clinical observations, patient history, and epidemiological information.  Fact Sheet for Patients:   RoadLapTop.co.za  Fact Sheet for Healthcare Providers: http://kim-miller.com/  This test is not yet approved or  cleared by the [place] FDA and has been authorized for detection and/or diagnosis of SARS-CoV-2 by FDA under an Emergency Use Authorization (EUA).  This EUA will remain in effect (meaning this test can be used) for the duration of the COVID-19 declaration under  Section 564(b)(1) of the Act, 21 U.S.C. section 360bbb-3(b)(1), unless the authorization is terminated or revoked sooner.  Performed at [facility], [address] [place], [place] [address]    Imaging ECHOCARDIOGRAM COMPLETE  Result Date: 09/19/2023    ECHOCARDIOGRAM REPORT   Patient Name:   Linda Brady Date of Exam: 09/19/2023 Medical Rec #:  [id number]         Height:       64.0 in Accession #:    [id number]        Weight:       177.2 lb Date of Birth:  11-Jul-1941        BSA:          1.858 m Patient Age:    81 years          BP:           119/61 mmHg Patient Gender: F                 HR:           73 bpm. Exam Location:  [facility] Procedure: 2D Echo, Cardiac Doppler and Color Doppler Indications:     Atrial Fibrillaiton  History:         Patient has prior history of Echocardiogram examinations, most                  recent 12/15/2021. CHF, CAD and Angina, COPD, Arrythmias:Atrial                  Fibrillation, Signs/Symptoms:Chest Pain and Shortness of                  Breath; Risk Factors:Hypertension and Dyslipidemia.  Sonographer:     Mikki Harbor Referring Phys:  [id number] Linda Brady Diagnosing Phys: Linda Odea MD  Sonographer Comments: Image acquisition challenging due to respiratory motion. IMPRESSIONS  1. Left ventricular ejection fraction, by estimation, is 65 to  Physician Discharge Summary   Patient: Linda Brady MRN: [id number]  DOB: December 08, 1940   Admit:     Date of Admission: 09/18/2023 Admitted from: home   Discharge: Date of discharge: 09/19/23 Disposition: Home health Condition at discharge: good  CODE STATUS: FULL CODE     Discharge Physician: Linda Nielsen, DO [facility]     PCP: Linda Mina, MD  Recommendations for Outpatient Follow-up:  Follow up with PCP Linda Mina, MD in 2-4 weeks Follow up with [facility] next week - should et a call     Discharge Instructions     Diet - low sodium heart healthy   Complete by: As directed    Increase activity slowly   Complete by: As directed          Discharge Diagnoses: Principal Problem:   Paroxysmal atrial fibrillation with RVR (HCC) Active Problems:   Atrial fibrillation with RVR (HCC)   Afib (HCC)        HPI: Linda Brady is a 82 y.o. female with medical history significant of nonobstructive CAD, COPD Gold stage II, HTN, hypothyroidism, restless leg syndrome, anxiety/depression, presented to ED from home via EMS with SOB/cough x7d and palpitations x3d.   Hospital course / significant events:  10/17: to ED, (+)Afib, new. Cardizem IV push, converted NSR. Admitted to hospitalist for COPD exacerbation and new Afib. Started Eliquis. Concern for fluid overload, given Lasix.  10/18: Echo ordered, no concerns. Rate remains controlled. Spoke w/ Linda Brady who will arrange follow up w/ [facility]   Consultants:  none  Procedures/Surgeries: none      ASSESSMENT & PLAN:   A-fib with RVR - resolved Now sinus rhythm  CHADS2=2, no Hx of GI bleed, or stroke, will start Eliquis, stop ASA Echo no concerns Follow w/ cardiology next week    Acute COPD exacerbation Improving after IV Solu-Medrol given, start p.o. prednisone x 5 days Continue home inhalers    Hyponatremia Hold off hydrochlorothiazide   HTN D/c  hydrochlorothiazide, starting diltiazem long acting Continue other meds as below    Epigastric pain Patient complaining about for last 3+ month she has been feeling cramping-like epigastric pain, usually after she ate meals, several times a week, occasionally happen at night.  She is status postcholecystectomy but never had GI workup.  Clinically suspect she has peptic ulcer, advised increased her PPI to twice daily and recommend she follow-up with GI for outpatient EGD.         Discharge Instructions  Allergies as of 09/19/2023       Reactions   Naproxen Sodium Anaphylaxis        Medication List     STOP taking these medications    aspirin EC 81 MG tablet   busPIRone 5 MG tablet Commonly known as: BUSPAR   simvastatin 20 MG tablet Commonly known as: ZOCOR       TAKE these medications    albuterol 108 (90 Base) MCG/ACT inhaler Commonly known as: VENTOLIN HFA Inhale 2 puffs into the lungs every 4 (four) hours as needed for wheezing or shortness of breath.   ALPRAZolam 0.25 MG tablet Commonly known as: XANAX Take 0.25 mg by mouth at bedtime as needed.   apixaban 5 MG Tabs tablet Commonly known as: ELIQUIS Take 1 tablet (5 mg total) by mouth 2 (two) times daily. Start PM 09/19/23   atorvastatin 40 MG tablet Commonly known as: LIPITOR Take 1 tablet (40 mg total) by mouth daily. Start

## 2023-09-20 ENCOUNTER — Other Ambulatory Visit: Payer: Self-pay | Admitting: Osteopathic Medicine

## 2023-09-20 MED ORDER — PREDNISONE 20 MG PO TABS: 40.0000 mg | ORAL_TABLET | Freq: Every day | ORAL | 0 refills | Status: AC

## 2023-09-20 NOTE — Progress Notes (Unsigned)
{  Select_[id number]} 

## 2023-10-01 ENCOUNTER — Ambulatory Visit: Payer: Medicare Other | Attending: Medical | Admitting: Medical

## 2023-10-01 ENCOUNTER — Encounter: Payer: Self-pay | Admitting: Medical

## 2023-10-01 VITALS — BP 128/72 | HR 84 | Ht 62.0 in | Wt 185.4 lb

## 2023-10-01 DIAGNOSIS — I10 Essential (primary) hypertension: Secondary | ICD-10-CM | POA: Diagnosis present

## 2023-10-01 DIAGNOSIS — I5032 Chronic diastolic (congestive) heart failure: Secondary | ICD-10-CM | POA: Diagnosis present

## 2023-10-01 DIAGNOSIS — I2510 Atherosclerotic heart disease of native coronary artery without angina pectoris: Secondary | ICD-10-CM | POA: Diagnosis not present

## 2023-10-01 DIAGNOSIS — E782 Mixed hyperlipidemia: Secondary | ICD-10-CM | POA: Insufficient documentation

## 2023-10-01 DIAGNOSIS — I480 Paroxysmal atrial fibrillation: Secondary | ICD-10-CM | POA: Insufficient documentation

## 2023-10-01 DIAGNOSIS — I350 Nonrheumatic aortic (valve) stenosis: Secondary | ICD-10-CM | POA: Insufficient documentation

## 2023-10-01 DIAGNOSIS — I200 Unstable angina: Secondary | ICD-10-CM

## 2023-10-01 NOTE — Progress Notes (Unsigned)
Cardiology Office Note:    Date:  10/02/2023   ID:  DEWEY VILLICANA, DOB [birth year], MRN [id number]  PCP:  [name], MD  [facility] Cardiologist:  Debbe Odea, MD  [facility] Electrophysiologist:  None   Referring MD: [name], MD   Chief Complaint: Hospital follow-up  History of Present Illness:    Linda Brady is a 82 y.o. female with a hx of non-obstructive CAD, COPD Gold stage II, hypertension, hypothyroidism, restless leg syndrome, anxiety and depression presents for hospital follow-up.  Patient has a history of CAD with 50% proximal RCA lesion and 30% LAD lesion by cath in 2021.   She was seen in January 2023 for chest pain during an admission.  Troponins were minimally elevated, representing demand supply mismatch.  Symptoms improved with inhalers.  Echo showed preserved LVEF.  She was started on Imdur.  Plan was for outpatient stress testing. Myoview lexiscan in 2023 showed normal LV perfusion, no ischemia, overall low risk, with mild coronary calcifications.   Patient was admitted 10/17 to the hospital found to have new onset Afib treated with Cardizem IV push and converted to normal sinus rhythm.  Patient was treated for COPD exacerbation and mild volume overload.  Patient was started on Eliquis.  Echo showed LVEF 65 to 70%, grade 2 diastolic dysfunction, mild MR, mild AI, severe AS mean gradient 49.2 mmHg.  Patient was diuresed and discharged home.  Today, the patient erpors she is overall doing well from a cardiac perspective. She saw pulmonology yesterday and he stopped an inhaler, which seems to have made breathing worse. She reports minimal right lower leg edema which is stable. She denies heart racing or palpitations. She uses a walker occasionally. She is doing PT and feels like she is progressing well. No chest pain reported.   Past Medical History:  Diagnosis Date   COPD (chronic obstructive pulmonary disease) (HCC)    GERD  (gastroesophageal reflux disease)    Hypertension    Hypothyroidism    Thyroid disease     Past Surgical History:  Procedure Laterality Date   BREAST CYST ASPIRATION Left 10 plus yrs   benign-twice   CATARACT EXTRACTION W/PHACO Left 03/20/2023   Procedure: CATARACT EXTRACTION PHACO AND INTRAOCULAR LENS PLACEMENT (IOC) LEFT VISION BLUE;  Surgeon: Estanislado Pandy, MD;  Location: [facility];  Service: Ophthalmology;  Laterality: Left;  21.02   01:38.2   LEFT HEART CATH AND CORONARY ANGIOGRAPHY N/A 09/11/2020   Procedure: LEFT HEART CATH AND CORONARY ANGIOGRAPHY;  Surgeon: Marcina Millard, MD;  Location: [facility];  Service: Cardiovascular;  Laterality: N/A;    Current Medications: Current Meds  Medication Sig   albuterol (VENTOLIN HFA) 108 (90 Base) MCG/ACT inhaler Inhale 2 puffs into the lungs every 4 (four) hours as needed for wheezing or shortness of breath.   ALPRAZolam (XANAX) 0.25 MG tablet Take 0.25 mg by mouth at bedtime as needed.   apixaban (ELIQUIS) 5 MG TABS tablet Take 1 tablet (5 mg total) by mouth 2 (two) times daily. Start PM 09/19/23   atorvastatin (LIPITOR) 40 MG tablet Take 1 tablet (40 mg total) by mouth daily.   budesonide-formoterol (SYMBICORT) 160-4.5 MCG/ACT inhaler Inhale 2 puffs into the lungs 2 (two) times daily.   cholecalciferol (VITAMIN D3) 25 MCG (1000 UNIT) tablet Take 1,000 Units by mouth daily.   diltiazem (CARDIZEM CD) 120 MG 24 hr capsule Take 1 capsule (120 mg total) by mouth at bedtime. Start PM 09/19/23  enalapril (VASOTEC) 20 MG tablet Take 1 tablet (20 mg total) by mouth 2 (two) times daily.   FLUoxetine (PROZAC) 20 MG capsule Take 20 mg by mouth daily.   hydrOXYzine (ATARAX/VISTARIL) 25 MG tablet Take 25 mg by mouth 3 (three) times daily as needed for anxiety.   isosorbide mononitrate (IMDUR) 30 MG 24 hr tablet Take 1 tablet (30 mg total) by mouth daily.   levothyroxine (SYNTHROID) 75 MCG tablet Take 75 mcg by mouth  daily.   pantoprazole (PROTONIX) 40 MG tablet Take 1 tablet (40 mg total) by mouth 2 (two) times daily.   vitamin B-12 (CYANOCOBALAMIN) 1000 MCG tablet Take 1,000 mcg by mouth daily.     Allergies:   Naproxen sodium   Social History   Socioeconomic History   Marital status: Widowed    Spouse name: Not on file   Number of children: Not on file   Years of education: Not on file   Highest education level: Not on file  Occupational History   Not on file  Tobacco Use   Smoking status: Former    Current packs/day: 0.50    Average packs/day: 0.5 packs/day for 12.0 years (6.0 ttl pk-yrs)    Types: Cigarettes   Smokeless tobacco: Never   Tobacco comments:    7-8 cigarettes a day  Vaping Use   Vaping status: Never Used  Substance and Sexual Activity   Alcohol use: Not Currently   Drug use: Not Currently   Sexual activity: Not Currently  Other Topics Concern   Not on file  Social History Narrative   Not on file   Social Determinants of Health   Financial Resource Strain: Low Risk  (09/30/2023)   Received from [facility]   Overall Financial Resource Strain (CARDIA)    Difficulty of Paying Living Expenses: Not hard at all  Food Insecurity: No Food Insecurity (09/30/2023)   Received from [facility]   Hunger Vital Sign    Worried About Running Out of Food in the Last Year: Never true    Ran Out of Food in the Last Year: Never true  Transportation Needs: No Transportation Needs (09/30/2023)   Received from [facility] - Transportation    In the past 12 months, has lack of transportation kept you from medical appointments or from getting medications?: No    Lack of Transportation (Non-Medical): No  Physical Activity: Not on file  Stress: Not on file  Social Connections: Not on file     Family History: The patient's family history is negative for Breast cancer.  ROS:   Please see the history of present  illness.     All other systems reviewed and are negative.  EKGs/Labs/Other Studies Reviewed:    The following studies were reviewed today:  Echo 09/2023 1. Left ventricular ejection fraction, by estimation, is 65 to 70%. The  left ventricle has normal function. The left ventricle has no regional  wall motion abnormalities. Left ventricular diastolic parameters are  consistent with Grade II diastolic  dysfunction (pseudonormalization).   2. Right ventricular systolic function is normal. The right ventricular  size is normal. There is mildly elevated pulmonary artery systolic  pressure.   3. The mitral valve is degenerative. Mild mitral valve regurgitation.   4. The aortic valve is calcified. Aortic valve regurgitation is mild.  Severe aortic valve stenosis. Aortic valve area, by VTI measures 0.99 cm.  Aortic valve mean gradient measures 49.2 mmHg.  Aortic valve Vmax measures  4.56 m/s.   5. The inferior vena cava is normal in size with greater than 50%  respiratory variability, suggesting right atrial pressure of 3 mmHg.   Conclusion(s)/Recommendation(s): Findings consistent with severe valvular  heart disease.   Myoview lexiscan 10/2022   The study is normal. The study is low risk.   No ST deviation was noted.   LV perfusion is normal. There is no evidence of ischemia. There is no evidence of infarction.   Left ventricular function is normal. End diastolic cavity size is normal. End systolic cavity size is normal.   CT continuation images showed moderate aortic calcifications and mild coronary calcifications.  LHC 2021  Ost RCA to Prox RCA lesion is 50% stenosed. Prox LAD lesion is 30% stenosed.   1.  Insignificant coronary artery disease with 50% stenosis ostium RCA 2.  Normal left ventricular function   Recommendations   1.  Continue medical therapy 2.  Aggressive risk factor modification 3.  May discharge home 4.  Follow-up with Dr. Lady Gary in 1 week  EKG:  EKG is  ordered today.  The ekg ordered today demonstrates NSR 84bpm, q waves anteroseptal leads  Recent Labs: 09/18/2023: ALT 12; B Natriuretic Peptide 161.8; BUN 13; Creatinine, Ser 0.71; Hemoglobin 13.5; Magnesium 1.9; Platelets 420; Potassium 3.5; Sodium 131; TSH 1.152  Recent Lipid Panel    Component Value Date/Time   CHOL 158 09/10/2020 1452   TRIG 100 09/10/2020 1452   HDL 53 09/10/2020 1452   CHOLHDL 3.0 09/10/2020 1452   VLDL 20 09/10/2020 1452   LDLCALC 85 09/10/2020 1452   Physical Exam:    VS:  BP 128/72 (BP Location: Left Arm, Patient Position: Sitting, Cuff Size: Large)   Pulse 84   Ht 5\' 2"  (1.575 m)   Wt 185 lb 6.4 oz (84.1 kg)   SpO2 96%   BMI 33.91 kg/m     Wt Readings from Last 3 Encounters:  10/01/23 185 lb 6.4 oz (84.1 kg)  09/18/23 177 lb 4 oz (80.4 kg)  06/10/23 179 lb 14.3 oz (81.6 kg)     GEN:  Well nourished, well developed in no acute distress HEENT: Normal NECK: No JVD; No carotid bruits LYMPHATICS: No lymphadenopathy CARDIAC: RRR, + murmur, no rubs, gallops RESPIRATORY:  Clear to auscultation without rales, wheezing or rhonchi  ABDOMEN: Soft, non-tender, non-distended MUSCULOSKELETAL:  No edema; No deformity  SKIN: Warm and dry NEUROLOGIC:  Alert and oriented x 3 PSYCHIATRIC:  Normal affect   ASSESSMENT:    1. Paroxysmal atrial fibrillation (HCC)   2. Aortic valve stenosis, etiology of cardiac valve disease unspecified   3. Chronic heart failure with preserved ejection fraction (HFpEF) (HCC)   4. Essential hypertension   5. Coronary artery disease involving native coronary artery of native heart without angina pectoris   6. Hyperlipidemia, mixed    PLAN:    In order of problems listed above:  Severe aortic valve Most recent echo showed severe AS, aortic valve area 0.99cm2, mean gradient 49.57mmHg, Vmax 4.16m/s. Obvious murmur on exam. Echo from 2023 showed LVEF 65 to 70%, grade 2 D, moderate AAS, mean gradient 38 mmHg, aortic valve DVI 0.38,  V-max19m/s. patient may be a TAVR candidate. I will refer to the structural valve team for further work-up and recommendations.   New onset Afib Patient was recently diagnosed with new onset afib in the hospital. She was given IV dilt with conversion to NSR. She was started on Cardizem 120mg   daily and Eliquis 5mg  BID. TSH wnl Mag and K are okay. She denies bleeding issues with Eliquis. She is in NSR today. CBC today.  Continue Cardizem 120 mg for rate control and Eliquis 5 mg twice daily for stroke prophylaxis.  HFpEF Previously on hydrochlorothiazide, but it was discontinued in the hospital for hyponatremia. She appears euvolemic on exam. She reports DOE after stopping Combivent inhaler. I will check BMET and BNP.   HTN BP is good today, continue Cardizem, Imdur and Lisinopril.   Nonobstructive CAD LHC in 2021 showed nonobstructive disease.  No chest pain reported.  No further ischemic workup indicated at this time.  No aspirin with Eliquis.  Continue Imdur 30 mg daily and Lipitor 40 mg daily.  Hyperlipidemia LDL 92, goal less than 70.  Continue Lipitor 40 mg daily.  Disposition: Follow up in 3 month(s) with MD/APP    Signed, Klayton Monie David Stall, PA-C  10/02/2023 10:47 AM    [facility]

## 2023-10-01 NOTE — Patient Instructions (Addendum)
Medication Instructions:  Your physician recommends that you continue on your current medications as directed. Please refer to the Current Medication list given to you today.  *If you need a refill on your cardiac medications before your next appointment, please call your pharmacy*  Lab Work: Your provider would like for you to have following labs drawn today CBC, BNP & BMET.   If you have labs (blood work) drawn today and your tests are completely normal, you will receive your results only by: MyChart Message (if you have MyChart) OR A paper copy in the mail If you have any lab test that is abnormal or we need to change your treatment, we will call you to review the results.  Testing/Procedures: - None ordered  Follow-Up: At [facility], you and your health needs are our priority.  As part of our continuing mission to provide you with exceptional heart care, we have created designated Provider Care Teams.  These Care Teams include your primary Cardiologist (physician) and Advanced Practice Providers (APPs -  Physician Assistants and Nurse Practitioners) who all work together to provide you with the care you need, when you need it.  Your next appointment:   3 month(s)  Provider:   Debbe Odea, MD    Other Instructions - Ambulatory referral to Structural Heart/Valve Clinic (only at [facility])

## 2023-10-02 ENCOUNTER — Telehealth: Payer: Self-pay | Admitting: Cardiology

## 2023-10-02 LAB — BASIC METABOLIC PANEL
BUN/Creatinine Ratio: 15 (ref 12–28)
BUN: 13 mg/dL (ref 8–27)
CO2: 23 mmol/L (ref 20–29)
Calcium: 9.7 mg/dL (ref 8.7–10.3)
Chloride: 99 mmol/L (ref 96–106)
Creatinine, Ser: 0.89 mg/dL (ref 0.57–1.00)
Glucose: 96 mg/dL (ref 70–99)
Potassium: 4.3 mmol/L (ref 3.5–5.2)
Sodium: 138 mmol/L (ref 134–144)
eGFR: 66 mL/min/{1.73_m2} (ref >59)

## 2023-10-02 LAB — CBC
Hematocrit: 34.4 % (ref 34.0–46.6)
Hemoglobin: 11.2 g/dL (ref 11.1–15.9)
MCH: 30.7 pg (ref 26.6–33.0)
MCHC: 32.6 g/dL (ref 31.5–35.7)
MCV: 94 fL (ref 79–97)
Platelets: 392 10*3/uL (ref 150–450)
RBC: 3.65 x10E6/uL — ABNORMAL LOW (ref 3.77–5.28)
RDW: 12.6 % (ref 11.7–15.4)
WBC: 9.3 10*3/uL (ref 3.4–10.8)

## 2023-10-02 LAB — BRAIN NATRIURETIC PEPTIDE: BNP: 217.2 pg/mL — ABNORMAL HIGH (ref 0.0–100.0)

## 2023-10-02 NOTE — Telephone Encounter (Signed)
Attempted to call the patient x2 (10/30 and 10/31) to schedule Structural Heart consult with no answer and no availability to leave a voice message. Will continue try to connect with the patient to discuss consultation.

## 2023-10-03 ENCOUNTER — Other Ambulatory Visit: Payer: Self-pay | Admitting: Emergency Medicine

## 2023-10-03 DIAGNOSIS — Z79899 Other long term (current) drug therapy: Secondary | ICD-10-CM

## 2023-10-03 MED ORDER — FUROSEMIDE 20 MG PO TABS: 20.0000 mg | ORAL_TABLET | Freq: Every day | ORAL | 3 refills | Status: DC

## 2023-10-03 NOTE — Telephone Encounter (Signed)
I spoke with the pt's daughter, [name], in regards to arranging TAVR consult.  Appointment scheduled on 10/15/2023 with Dr Lynnette Caffey.

## 2023-10-13 ENCOUNTER — Other Ambulatory Visit: Payer: Self-pay

## 2023-10-13 DIAGNOSIS — Z79899 Other long term (current) drug therapy: Secondary | ICD-10-CM

## 2023-10-14 LAB — BASIC METABOLIC PANEL
BUN/Creatinine Ratio: 14 (ref 12–28)
BUN: 14 mg/dL (ref 8–27)
CO2: 25 mmol/L (ref 20–29)
Calcium: 9.8 mg/dL (ref 8.7–10.3)
Chloride: 97 mmol/L (ref 96–106)
Creatinine, Ser: 1.03 mg/dL — ABNORMAL HIGH (ref 0.57–1.00)
Glucose: 87 mg/dL (ref 70–99)
Potassium: 4.6 mmol/L (ref 3.5–5.2)
Sodium: 137 mmol/L (ref 134–144)
eGFR: 55 mL/min/{1.73_m2} — ABNORMAL LOW (ref >59)

## 2023-10-15 ENCOUNTER — Encounter: Payer: Self-pay | Admitting: Internal Medicine

## 2023-10-15 ENCOUNTER — Ambulatory Visit: Payer: Medicare Other | Attending: Internal Medicine | Admitting: Internal Medicine

## 2023-10-15 VITALS — BP 132/64 | HR 84 | Ht 63.0 in | Wt 181.0 lb

## 2023-10-15 DIAGNOSIS — D6869 Other thrombophilia: Secondary | ICD-10-CM | POA: Insufficient documentation

## 2023-10-15 DIAGNOSIS — I350 Nonrheumatic aortic (valve) stenosis: Secondary | ICD-10-CM | POA: Diagnosis not present

## 2023-10-15 DIAGNOSIS — N1831 Chronic kidney disease, stage 3a: Secondary | ICD-10-CM | POA: Insufficient documentation

## 2023-10-15 DIAGNOSIS — I2510 Atherosclerotic heart disease of native coronary artery without angina pectoris: Secondary | ICD-10-CM | POA: Insufficient documentation

## 2023-10-15 DIAGNOSIS — I5032 Chronic diastolic (congestive) heart failure: Secondary | ICD-10-CM | POA: Insufficient documentation

## 2023-10-15 DIAGNOSIS — I480 Paroxysmal atrial fibrillation: Secondary | ICD-10-CM | POA: Diagnosis not present

## 2023-10-15 MED ORDER — METOPROLOL TARTRATE 50 MG PO TABS: ORAL_TABLET | ORAL | 0 refills | Status: DC

## 2023-10-15 NOTE — Progress Notes (Addendum)
Pre Surgical Assessment: 5 M Walk Test  52M=16.46ft  5 Meter Walk Test- trial 1: 9.03 seconds 5 Meter Walk Test- trial 2: 8.06 seconds 5 Meter Walk Test- trial 3: 9.46 seconds 5 Meter Walk Test Average: 8.85 seconds  _________________________   Procedure Type: Isolated AVR Perioperative Outcome Estimate % Operative Mortality 5.95% Morbidity & Mortality 15.4% Stroke 1.03% Renal Failure 4.46% Reoperation 3.17% Prolonged Ventilation 9.64% Deep Sternal Wound Infection 0.116% Long Hospital Stay (>14 days) 9.95% Short Hospital Stay (<6 days)* 21.9%

## 2023-10-15 NOTE — Patient Instructions (Addendum)
Medication Instructions:  No changes *If you need a refill on your cardiac medications before your next appointment, please call your pharmacy*   Lab Work: Today: pro-bnp  If you have labs (blood work) drawn today and your tests are completely normal, you will receive your results only by: MyChart Message (if you have MyChart) OR A paper copy in the mail If you have any lab test that is abnormal or we need to change your treatment, we will call you to review the results.   Testing/Procedures: Your physician has requested that you have a cardiac catheterization. Cardiac catheterization is used to diagnose and/or treat various heart conditions. Doctors may recommend this procedure for a number of different reasons. The most common reason is to evaluate chest pain. Chest pain can be a symptom of coronary artery disease (CAD), and cardiac catheterization can show whether plaque is narrowing or blocking your heart's arteries. This procedure is also used to evaluate the valves, as well as measure the blood flow and oxygen levels in different parts of your heart. For further information please visit [email or web address] Please follow instruction sheet, as given.    Follow-Up: Per Structural Heart Team  Other Instructions       Cardiac/Peripheral Catheterization   You are scheduled for a Cardiac Catheterization on Thursday, November 21 with Dr. Alverda Skeans.  1. Please arrive at the [facility] (Main Entrance A) at [facility]: [address] [place], [place] [address] at 10:00 AM (This time is TWO hour(s) before your procedure to ensure your preparation). Free valet parking service is available. You will check in at ADMITTING. The support person will be asked to wait in the waiting room.  It is OK to have someone drop you off and come back when you are ready to be discharged.        Special note: Every effort is made to have your procedure done on time. Please understand that  emergencies sometimes delay scheduled procedures.  2. Diet: Do not eat solid foods after midnight.  You may have clear liquids until 5 AM the day of the procedure.  3. Labs: up to date  4. Medication instructions in preparation for your procedure:   Contrast Allergy: No   No Eliquis after Monday 10/20/23, until instructed after cath No Lasix (furosemide) on the day of cath  On the morning of your procedure, take Aspirin 81 mg and any morning medicines NOT listed above.  You may use sips of water.  5. Plan to go home the same day, you will only stay overnight if medically necessary. 6. You MUST have a responsible adult to drive you home. 7. An adult MUST be with you the first 24 hours after you arrive home. 8. Bring a current list of your medications, and the last time and date medication taken. 9. Bring ID and current insurance cards. 10.Please wear clothes that are easy to get on and off and wear slip-on shoes.  Thank you for allowing [place] to care for you!   -- [facility]   You have been referred to Dr Dutch Quint for dental evaluation and treatment. His office is located at [address], [place], [place] [address]. Please contact the office in 1 week at [phone] to arrange appointment, if they have not contacted you for an appointment.

## 2023-10-15 NOTE — Progress Notes (Signed)
Patient ID: Linda Brady MRN: [id number] DOB/AGE: 08-30-1941 82 y.o.  Primary Care Physician:Hedrick, Fayrene [name], MD Primary Cardiologist: Debbe Odea, MD  FOCUSED CARDIOVASCULAR PROBLEM LIST:   Aortic stenosis; EKG NSR without BBBs AVA 0.99, mean gradient 49, V-max 4.5, EF 65 to 70% TTE 2024 Coronary artery disease Mild ostial RCA disease cath 2021 COPD Hypertension Hypothyroidism Paroxysmal atrial fibrillation On Eliquis BMI 33 CKD stage III    HISTORY OF PRESENT ILLNESS: The patient is a 82 y.o. female with the indicated medical history here for recommendations regarding her aortic valvular disease.  The patient was admitted October with new onset atrial fibrillation.  She was treated for COPD exacerbation and mild volume overload.  She was started on Eliquis at that time and an echocardiogram demonstrated severe aortic stenosis.  She was seen in follow-up and is much improved from a breathing standpoint.    She is here with her daughter.  She lives by herself.  She has noticed over the last year that she has had worsening shortness of breath.  The shortness of breath is not responsive to her COPD inhalers.  She has trouble doing her activities of daily living without having to stop and take frequent breaks.  She would like to be able to take care of herself and go grocery shopping again.  She is very very limited.  She has been chronic orthopnea.  This has been a longstanding issue.  She has had some swelling in her legs that mostly dependent in nature.  She tells me that she has some varicose veins.  She occasionally gets chest pain when she walks but this does not happen on a regular routine basis.  She has had no severe lightheadedness or syncope.  She would very much like to feel better.  She has an upper bridge and tells me that her lower teeth are not in good shape.  She has not seen a dentist in a while.  Past Medical History:  Diagnosis Date   COPD (chronic  obstructive pulmonary disease) (HCC)    GERD (gastroesophageal reflux disease)    Hypertension    Hypothyroidism    Thyroid disease     Past Surgical History:  Procedure Laterality Date   BREAST CYST ASPIRATION Left 10 plus yrs   benign-twice   CATARACT EXTRACTION W/PHACO Left 03/20/2023   Procedure: CATARACT EXTRACTION PHACO AND INTRAOCULAR LENS PLACEMENT (IOC) LEFT VISION BLUE;  Surgeon: Estanislado Pandy, MD;  Location: [facility];  Service: Ophthalmology;  Laterality: Left;  21.02   01:38.2   LEFT HEART CATH AND CORONARY ANGIOGRAPHY N/A 09/11/2020   Procedure: LEFT HEART CATH AND CORONARY ANGIOGRAPHY;  Surgeon: Marcina Millard, MD;  Location: [facility] IN[facility]IVE [facility];  Service: Cardiovascular;  Laterality: N/A;    Family History  Problem Relation Age of Onset   Breast cancer Neg Hx     Social History   Socioeconomic History   Marital status: Widowed    Spouse name: Not on file   Number of children: Not on file   Years of education: Not on file   Highest education level: Not on file  Occupational History   Not on file  Tobacco Use   Smoking status: Former    Current packs/day: 0.50    Average packs/day: 0.5 packs/day for 12.0 years (6.0 ttl pk-yrs)    Types: Cigarettes   Smokeless tobacco: Never   Tobacco comments:    7-8 cigarettes a day  Vaping Use   Vaping status:  Never Used  Substance and Sexual Activity   Alcohol use: Not Currently   Drug use: Not Currently   Sexual activity: Not Currently  Other Topics Concern   Not on file  Social History Narrative   Not on file   Social Determinants of Health   Financial Resource Strain: Low Risk  (09/30/2023)   Received from [facility]   Overall Financial Resource Strain (CARDIA)    Difficulty of Paying Living Expenses: Not hard at all  Food Insecurity: No Food Insecurity (09/30/2023)   Received from [facility]   Hunger Vital Sign    Worried About Running Out of  Food in the Last Year: Never true    Ran Out of Food in the Last Year: Never true  Transportation Needs: No Transportation Needs (09/30/2023)   Received from [facility] - [facility]    In the past 12 months, has lack of transportation kept you from medical appointments or from getting medications?: No    Lack of Transportation (Non-Medical): No  Physical Activity: Not on file  Stress: Not on file  Social Connections: Not on file  Intimate Partner Violence: Not At Risk (09/18/2023)   Humiliation, Afraid, Rape, and Kick questionnaire    Fear of Current or Ex-Partner: No    Emotionally Abused: No    Physically Abused: No    Sexually Abused: No     Prior to Admission medications   Medication Sig Start Date End Date Taking? Authorizing Provider  ALPRAZolam (XANAX) 0.25 MG tablet Take 0.25 mg by mouth at bedtime as needed.   Yes [provider]  apixaban (ELIQUIS) 5 MG TABS tablet Take 1 tablet (5 mg total) by mouth 2 (two) times daily. Start PM 09/19/23 09/19/23  Yes Sunnie Nielsen, DO  atorvastatin (LIPITOR) 40 MG tablet Take 1 tablet (40 mg total) by mouth daily. 09/20/23  Yes Sunnie Nielsen, DO  budesonide-formoterol [facility]) 160-4.5 MCG/ACT inhaler Inhale 2 puffs into the lungs 2 (two) times daily. 09/18/23  Yes Mikey College T, MD  cholecalciferol (VITAMIN D3) 25 MCG (1000 UNIT) tablet Take 1,000 Units by mouth daily.   Yes [provider]  diltiazem (CARDIZEM CD) 120 MG 24 hr capsule Take 1 capsule (120 mg total) by mouth at bedtime. Start PM 09/19/23 09/19/23 09/18/24 Yes Alexander, Dorene Grebe, DO  enalapril (VASOTEC) 20 MG tablet Take 1 tablet (20 mg total) by mouth 2 (two) times daily. 09/10/22  Yes Furth, Cadence H, PA-C  FLUoxetine (PROZAC) 20 MG capsule Take 20 mg by mouth daily. 03/14/20  Yes [provider]  furosemide (LASIX) 20 MG tablet Take 1 tablet (20 mg total) by mouth daily. 10/03/23 01/01/24 Yes Furth,  Cadence H, PA-C  hydrOXYzine (ATARAX/VISTARIL) 25 MG tablet Take 25 mg by mouth 3 (three) times daily as needed for anxiety. 12/13/19  Yes [provider]  Ipratropium-Albuterol (COMBIVENT) 20-100 MCG/ACT AERS respimat Inhale 1 puff into the lungs 4 (four) times daily. 04/14/22  Yes Darlin Priestly, MD  isosorbide mononitrate (IMDUR) 30 MG 24 hr tablet Take 1 tablet (30 mg total) by mouth daily. 10/14/22  Yes Furth, Cadence H, PA-C  levothyroxine (SYNTHROID) 75 MCG tablet Take 75 mcg by mouth daily. 05/02/20  Yes [provider]  pantoprazole (PROTONIX) 40 MG tablet Take 1 tablet (40 mg total) by mouth 2 (two) times daily. 09/19/23  Yes Sunnie Nielsen, DO  vitamin B-12 (CYANOCOBALAMIN) 1000 MCG tablet Take 1,000 mcg by mouth daily.  Yes [provider]    Allergies  Allergen Reactions   Naproxen Sodium Anaphylaxis    REVIEW OF SYSTEMS:  General: no fevers/chills/night sweats Eyes: no blurry vision, diplopia, or amaurosis ENT: no sore throat or hearing loss Resp: no cough, wheezing, or hemoptysis CV: no edema or palpitations GI: no abdominal pain, nausea, vomiting, diarrhea, or constipation GU: no dysuria, frequency, or hematuria Skin: no rash Neuro: no headache, numbness, tingling, or weakness of extremities Musculoskeletal: no joint pain or swelling Heme: no bleeding, DVT, or easy bruising Endo: no polydipsia or polyuria  BP 132/64   Pulse 84   Ht 5\' 3"  (1.6 m)   Wt 181 lb (82.1 kg)   SpO2 97%   BMI 32.06 kg/m   PHYSICAL EXAM: GEN:  AO x 3 in no acute distress HEENT: normal Dentition: Poor Neck: JVP normal. +2 carotid upstrokes without bruits. No thyromegaly. Lungs: equal expansion, clear bilaterally CV: Apex is discrete and nondisplaced, RRR with 3 out of 6 systolic ejection murmur Abd: soft, non-tender, non-distended; no bruit; positive bowel sounds Ext: no edema, ecchymoses, or cyanosis Vascular: 2+ femoral pulses, 2+ radial pulses       Skin:  warm and dry without rash Neuro: CN II-XII grossly intact; motor and sensory grossly intact    DATA AND STUDIES:  EKG:  EKG Interpretation Date/Time:    Ventricular Rate:    PR Interval:    QRS Duration:    QT Interval:    QTC Calculation:   R Axis:      Text Interpretation:          Cardiac Studies & Procedures   CARDIAC CATHETERIZATION  CARDIAC CATHETERIZATION 09/11/2020  Narrative  Ost RCA to Prox RCA lesion is 50% stenosed.  Prox LAD lesion is 30% stenosed.  1.  Insignificant coronary artery disease with 50% stenosis ostium RCA 2.  Normal left ventricular function  Recommendations  1.  Continue medical therapy 2.  Aggressive risk factor modification 3.  May discharge home 4.  Follow-up with Dr. Lady Gary in 1 week  Findings Coronary Findings Diagnostic  Dominance: Right  Left Anterior Descending Prox LAD lesion is 30% stenosed.  Right Coronary Artery Ost RCA to Prox RCA lesion is 50% stenosed. The lesion is tubular.  Intervention  No interventions have been documented.   STRESS TESTS  NM MYOCAR MULTI W/SPECT W 10/21/2022  Narrative   The study is normal. The study is low risk.   No ST deviation was noted.   LV perfusion is normal. There is no evidence of ischemia. There is no evidence of infarction.   Left ventricular function is normal. End diastolic cavity size is normal. End systolic cavity size is normal.   CT continuation images showed moderate aortic calcifications and mild coronary calcifications.   ECHOCARDIOGRAM  ECHOCARDIOGRAM COMPLETE 09/19/2023  Narrative ECHOCARDIOGRAM REPORT    Patient Name:   LEARA WELBY Strother Date of Exam: 09/19/2023 Medical Rec #:  [id number]         Height:       64.0 in Accession #:    [id number]        Weight:       177.2 lb Date of Birth:  12/01/1941        BSA:          1.858 m Patient Age:    81 years          BP:           119/61 mmHg Patient  Gender: F                 HR:           73 bpm. Exam  Location:  [facility]  Procedure: 2D Echo, Cardiac Doppler and Color Doppler  Indications:     Atrial Fibrillaiton  History:         Patient has prior history of Echocardiogram examinations, most recent 12/15/2021. CHF, CAD and Angina, COPD, Arrythmias:Atrial Fibrillation, Signs/Symptoms:Chest Pain and Shortness of Breath; Risk Factors:Hypertension and Dyslipidemia.  Sonographer:     Mikki Harbor Referring Phys:  [id number] Sunnie Nielsen Diagnosing Phys: Debbe Odea MD   Sonographer Comments: Image acquisition challenging due to respiratory motion. IMPRESSIONS   1. Left ventricular ejection fraction, by estimation, is 65 to 70%. The left ventricle has normal function. The left ventricle has no regional wall motion abnormalities. Left ventricular diastolic parameters are consistent with Grade II diastolic dysfunction (pseudonormalization). 2. Right ventricular systolic function is normal. The right ventricular size is normal. There is mildly elevated pulmonary artery systolic pressure. 3. The mitral valve is degenerative. Mild mitral valve regurgitation. 4. The aortic valve is calcified. Aortic valve regurgitation is mild. Severe aortic valve stenosis. Aortic valve area, by VTI measures 0.99 cm. Aortic valve mean gradient measures 49.2 mmHg. Aortic valve Vmax measures 4.56 m/s. 5. The inferior vena cava is normal in size with greater than 50% respiratory variability, suggesting right atrial pressure of 3 mmHg.  Conclusion(s)/Recommendation(s): Findings consistent with severe valvular heart disease.  FINDINGS Left Ventricle: Left ventricular ejection fraction, by estimation, is 65 to 70%. The left ventricle has normal function. The left ventricle has no regional wall motion abnormalities. The left ventricular internal cavity size was normal in size. There is no left ventricular hypertrophy. Left ventricular diastolic parameters are consistent with Grade II diastolic dysfunction  (pseudonormalization).  Right Ventricle: The right ventricular size is normal. No increase in right ventricular wall thickness. Right ventricular systolic function is normal. There is mildly elevated pulmonary artery systolic pressure. The tricuspid regurgitant velocity is 2.71 m/s, and with an assumed right atrial pressure of 8 mmHg, the estimated right ventricular systolic pressure is 37.4 mmHg.  Left Atrium: Left atrial size was normal in size.  Right Atrium: Right atrial size was normal in size.  Pericardium: There is no evidence of pericardial effusion.  Mitral Valve: The mitral valve is degenerative in appearance. Mild to moderate mitral annular calcification. Mild mitral valve regurgitation. MV peak gradient, 7.5 mmHg. The mean mitral valve gradient is 3.0 mmHg.  Tricuspid Valve: The tricuspid valve is normal in structure. Tricuspid valve regurgitation is mild.  Aortic Valve: The aortic valve is calcified. Aortic valve regurgitation is mild. Aortic regurgitation PHT measures 436 msec. Severe aortic stenosis is present. Aortic valve mean gradient measures 49.2 mmHg. Aortic valve peak gradient measures 83.1 mmHg. Aortic valve area, by VTI measures 0.99 cm.  Pulmonic Valve: The pulmonic valve was not well visualized. Pulmonic valve regurgitation is trivial.  Aorta: The aortic root and ascending aorta are structurally normal, with no evidence of dilitation.  Venous: The inferior vena cava is normal in size with greater than 50% respiratory variability, suggesting right atrial pressure of 3 mmHg.  IAS/Shunts: No atrial level shunt detected by color flow Doppler.   LEFT VENTRICLE PLAX 2D LVIDd:         3.60 cm   Diastology LVIDs:         2.40 cm   LV e' medial:  7.45 cm/s LV PW:         1.30 cm   LV E/e' medial:  15.7 LV IVS:        1.30 cm   LV e' lateral:   6.92 cm/s LVOT diam:     2.00 cm   LV E/e' lateral: 16.9 LV SV:         106 LV SV Index:   57 LVOT Area:     3.14  cm   RIGHT VENTRICLE RV Basal diam:  3.25 cm RV Mid diam:    2.90 cm RV S prime:     12.60 cm/s TAPSE (M-mode): 2.6 cm  LEFT ATRIUM             Index        RIGHT ATRIUM           Index LA diam:        4.50 cm 2.42 cm/m   RA Area:     15.10 cm LA Vol (A2C):   63.0 ml 33.90 ml/m  RA Volume:   40.70 ml  21.90 ml/m LA Vol (A4C):   45.5 ml 24.48 ml/m LA Biplane Vol: 56.5 ml 30.40 ml/m AORTIC VALVE AV Area (Vmax):    1.03 cm AV Area (Vmean):   0.97 cm AV Area (VTI):     0.99 cm AV Vmax:           455.80 cm/s AV Vmean:          322.600 cm/s AV VTI:            1.079 m AV Peak Grad:      83.1 mmHg AV Mean Grad:      49.2 mmHg LVOT Vmax:         150.00 cm/s LVOT Vmean:        99.600 cm/s LVOT VTI:          0.338 m LVOT/AV VTI ratio: 0.31 AI PHT:            436 msec  AORTA Ao Root diam: 3.10 cm Ao Asc diam:  2.80 cm  MITRAL VALVE                TRICUSPID VALVE MV Area (PHT): 2.90 cm     TR Peak grad:   29.4 mmHg MV Area VTI:   2.62 cm     TR Vmax:        271.00 cm/s MV Peak grad:  7.5 mmHg MV Mean grad:  3.0 mmHg     SHUNTS MV Vmax:       1.37 m/s     Systemic VTI:  0.34 m MV Vmean:      79.3 cm/s    Systemic Diam: 2.00 cm MV Decel Time: 262 msec MV E velocity: 117.00 cm/s MV A velocity: 96.90 cm/s MV E/A ratio:  1.21  Debbe Odea MD Electronically signed by Debbe Odea MD Signature Date/Time: 09/19/2023/4:30:06 PM    Final             09/18/2023: ALT 12; Magnesium 1.9; TSH 1.152 10/01/2023: BNP 217.2; Hemoglobin 11.2; Platelets 392 10/13/2023: BUN 14; Creatinine, Ser 1.03; Potassium 4.6; Sodium 137   STS RISK CALCULATOR: Pending  NHYA CLASS: 2    ASSESSMENT AND PLAN:   Aortic valve stenosis, etiology of cardiac valve disease unspecified - Plan: EKG 12-Lead  Chronic heart failure with preserved ejection fraction (HFpEF) (HCC) - Plan: Pro b natriuretic peptide (BNP)  Stage 3a chronic kidney disease (  HCC)  Paroxysmal atrial  fibrillation (HCC)  Secondary hypercoagulable state (HCC)  Mild CAD  The patient has developed severe symptomatic aortic stenosis.  She is quite limited and would very much like to feel better.  She does have competing etiologies for shortness of breath including COPD.  It would seem to me though that her aortic stenosis is driving her symptomatology over the last year or so.  This did culminate in an admission with acute on chronic diastolic heart failure and atrial fibrillation.  Given her severe aortic stenosis with NYHA II symptoms, we will refer her for coronary angiography and right heart catheterization study, CTA, and cardiothoracic surgical opinion to adjudicate about SAVR versus TAVR.  Further recommendations will be issued following review of these imaging studies.  I have personally reviewed the patients imaging data as summarized above.  I have reviewed the natural history of aortic stenosis with the patient and family members who are present today. We have discussed the limitations of medical therapy and the poor prognosis associated with symptomatic aortic stenosis. We have also reviewed potential treatment options, including palliative medical therapy, conventional surgical aortic valve replacement, and transcatheter aortic valve replacement. We discussed treatment options in the context of this patient's specific comorbid medical conditions.   All of the patient's questions were answered today. Will make further recommendations based on the results of studies outlined above.   Total time spent with patient today 55 minutes. This includes reviewing records, evaluating the patient and coordinating care.   Orbie Pyo, MD  10/15/2023 2:56 PM    [facility] [address] [place], [place], [place]  [address] Phone: [phone]; Fax: [phone]

## 2023-10-15 NOTE — H&P (View-Only) (Signed)
 Patient ID: Linda Brady MRN: [id number] DOB/AGE: 08-30-1941 82 y.o.  Primary Care Physician:Hedrick, Fayrene [name], MD Primary Cardiologist: Debbe Odea, MD  FOCUSED CARDIOVASCULAR PROBLEM LIST:   Aortic stenosis; EKG NSR without BBBs AVA 0.99, mean gradient 49, V-max 4.5, EF 65 to 70% TTE 2024 Coronary artery disease Mild ostial RCA disease cath 2021 COPD Hypertension Hypothyroidism Paroxysmal atrial fibrillation On Eliquis BMI 33 CKD stage III    HISTORY OF PRESENT ILLNESS: The patient is a 82 y.o. female with the indicated medical history here for recommendations regarding her aortic valvular disease.  The patient was admitted October with new onset atrial fibrillation.  She was treated for COPD exacerbation and mild volume overload.  She was started on Eliquis at that time and an echocardiogram demonstrated severe aortic stenosis.  She was seen in follow-up and is much improved from a breathing standpoint.    She is here with her daughter.  She lives by herself.  She has noticed over the last year that she has had worsening shortness of breath.  The shortness of breath is not responsive to her COPD inhalers.  She has trouble doing her activities of daily living without having to stop and take frequent breaks.  She would like to be able to take care of herself and go grocery shopping again.  She is very very limited.  She has been chronic orthopnea.  This has been a longstanding issue.  She has had some swelling in her legs that mostly dependent in nature.  She tells me that she has some varicose veins.  She occasionally gets chest pain when she walks but this does not happen on a regular routine basis.  She has had no severe lightheadedness or syncope.  She would very much like to feel better.  She has an upper bridge and tells me that her lower teeth are not in good shape.  She has not seen a dentist in a while.  Past Medical History:  Diagnosis Date   COPD (chronic  obstructive pulmonary disease) (HCC)    GERD (gastroesophageal reflux disease)    Hypertension    Hypothyroidism    Thyroid disease     Past Surgical History:  Procedure Laterality Date   BREAST CYST ASPIRATION Left 10 plus yrs   benign-twice   CATARACT EXTRACTION W/PHACO Left 03/20/2023   Procedure: CATARACT EXTRACTION PHACO AND INTRAOCULAR LENS PLACEMENT (IOC) LEFT VISION BLUE;  Surgeon: Estanislado Pandy, MD;  Location: [facility];  Service: Ophthalmology;  Laterality: Left;  21.02   01:38.2   LEFT HEART CATH AND CORONARY ANGIOGRAPHY N/A 09/11/2020   Procedure: LEFT HEART CATH AND CORONARY ANGIOGRAPHY;  Surgeon: Marcina Millard, MD;  Location: [facility] INVASIVE CV LAB;  Service: Cardiovascular;  Laterality: N/A;    Family History  Problem Relation Age of Onset   Breast cancer Neg Hx     Social History   Socioeconomic History   Marital status: Widowed    Spouse name: Not on file   Number of children: Not on file   Years of education: Not on file   Highest education level: Not on file  Occupational History   Not on file  Tobacco Use   Smoking status: Former    Current packs/day: 0.50    Average packs/day: 0.5 packs/day for 12.0 years (6.0 ttl pk-yrs)    Types: Cigarettes   Smokeless tobacco: Never   Tobacco comments:    7-8 cigarettes a day  Vaping Use   Vaping status:  Never Used  Substance and Sexual Activity   Alcohol use: Not Currently   Drug use: Not Currently   Sexual activity: Not Currently  Other Topics Concern   Not on file  Social History Narrative   Not on file   Social Determinants of Health   Financial Resource Strain: Low Risk  (09/30/2023)   Received from [facility]   Overall Financial Resource Strain (CARDIA)    Difficulty of Paying Living Expenses: Not hard at all  Food Insecurity: No Food Insecurity (09/30/2023)   Received from [facility]   Hunger Vital Sign    Worried About Running Out of  Food in the Last Year: Never true    Ran Out of Food in the Last Year: Never true  Transportation Needs: No Transportation Needs (09/30/2023)   Received from [facility] - [facility]    In the past 12 months, has lack of transportation kept you from medical appointments or from getting medications?: No    Lack of Transportation (Non-Medical): No  Physical Activity: Not on file  Stress: Not on file  Social Connections: Not on file  Intimate Partner Violence: Not At Risk (09/18/2023)   Humiliation, Afraid, Rape, and Kick questionnaire    Fear of Current or Ex-Partner: No    Emotionally Abused: No    Physically Abused: No    Sexually Abused: No     Prior to Admission medications   Medication Sig Start Date End Date Taking? Authorizing Provider  ALPRAZolam (XANAX) 0.25 MG tablet Take 0.25 mg by mouth at bedtime as needed.   Yes [provider]  apixaban (ELIQUIS) 5 MG TABS tablet Take 1 tablet (5 mg total) by mouth 2 (two) times daily. Start PM 09/19/23 09/19/23  Yes Sunnie Nielsen, DO  atorvastatin (LIPITOR) 40 MG tablet Take 1 tablet (40 mg total) by mouth daily. 09/20/23  Yes Sunnie Nielsen, DO  budesonide-formoterol [facility]) 160-4.5 MCG/ACT inhaler Inhale 2 puffs into the lungs 2 (two) times daily. 09/18/23  Yes Mikey College T, MD  cholecalciferol (VITAMIN D3) 25 MCG (1000 UNIT) tablet Take 1,000 Units by mouth daily.   Yes [provider]  diltiazem (CARDIZEM CD) 120 MG 24 hr capsule Take 1 capsule (120 mg total) by mouth at bedtime. Start PM 09/19/23 09/19/23 09/18/24 Yes Alexander, Dorene Grebe, DO  enalapril (VASOTEC) 20 MG tablet Take 1 tablet (20 mg total) by mouth 2 (two) times daily. 09/10/22  Yes Furth, Cadence H, PA-C  FLUoxetine (PROZAC) 20 MG capsule Take 20 mg by mouth daily. 03/14/20  Yes [provider]  furosemide (LASIX) 20 MG tablet Take 1 tablet (20 mg total) by mouth daily. 10/03/23 01/01/24 Yes Furth,  Cadence H, PA-C  hydrOXYzine (ATARAX/VISTARIL) 25 MG tablet Take 25 mg by mouth 3 (three) times daily as needed for anxiety. 12/13/19  Yes [provider]  Ipratropium-Albuterol (COMBIVENT) 20-100 MCG/ACT AERS respimat Inhale 1 puff into the lungs 4 (four) times daily. 04/14/22  Yes Darlin Priestly, MD  isosorbide mononitrate (IMDUR) 30 MG 24 hr tablet Take 1 tablet (30 mg total) by mouth daily. 10/14/22  Yes Furth, Cadence H, PA-C  levothyroxine (SYNTHROID) 75 MCG tablet Take 75 mcg by mouth daily. 05/02/20  Yes [provider]  pantoprazole (PROTONIX) 40 MG tablet Take 1 tablet (40 mg total) by mouth 2 (two) times daily. 09/19/23  Yes Sunnie Nielsen, DO  vitamin B-12 (CYANOCOBALAMIN) 1000 MCG tablet Take 1,000 mcg by mouth daily.  Yes [provider]    Allergies  Allergen Reactions   Naproxen Sodium Anaphylaxis    REVIEW OF SYSTEMS:  General: no fevers/chills/night sweats Eyes: no blurry vision, diplopia, or amaurosis ENT: no sore throat or hearing loss Resp: no cough, wheezing, or hemoptysis CV: no edema or palpitations GI: no abdominal pain, nausea, vomiting, diarrhea, or constipation GU: no dysuria, frequency, or hematuria Skin: no rash Neuro: no headache, numbness, tingling, or weakness of extremities Musculoskeletal: no joint pain or swelling Heme: no bleeding, DVT, or easy bruising Endo: no polydipsia or polyuria  BP 132/64   Pulse 84   Ht 5\' 3"  (1.6 m)   Wt 181 lb (82.1 kg)   SpO2 97%   BMI 32.06 kg/m   PHYSICAL EXAM: GEN:  AO x 3 in no acute distress HEENT: normal Dentition: Poor Neck: JVP normal. +2 carotid upstrokes without bruits. No thyromegaly. Lungs: equal expansion, clear bilaterally CV: Apex is discrete and nondisplaced, RRR with 3 out of 6 systolic ejection murmur Abd: soft, non-tender, non-distended; no bruit; positive bowel sounds Ext: no edema, ecchymoses, or cyanosis Vascular: 2+ femoral pulses, 2+ radial pulses       Skin:  warm and dry without rash Neuro: CN II-XII grossly intact; motor and sensory grossly intact    DATA AND STUDIES:  EKG:  EKG Interpretation Date/Time:    Ventricular Rate:    PR Interval:    QRS Duration:    QT Interval:    QTC Calculation:   R Axis:      Text Interpretation:          Cardiac Studies & Procedures   CARDIAC CATHETERIZATION  CARDIAC CATHETERIZATION 09/11/2020  Narrative  Ost RCA to Prox RCA lesion is 50% stenosed.  Prox LAD lesion is 30% stenosed.  1.  Insignificant coronary artery disease with 50% stenosis ostium RCA 2.  Normal left ventricular function  Recommendations  1.  Continue medical therapy 2.  Aggressive risk factor modification 3.  May discharge home 4.  Follow-up with Dr. Lady Gary in 1 week  Findings Coronary Findings Diagnostic  Dominance: Right  Left Anterior Descending Prox LAD lesion is 30% stenosed.  Right Coronary Artery Ost RCA to Prox RCA lesion is 50% stenosed. The lesion is tubular.  Intervention  No interventions have been documented.   STRESS TESTS  NM MYOCAR MULTI W/SPECT W 10/21/2022  Narrative   The study is normal. The study is low risk.   No ST deviation was noted.   LV perfusion is normal. There is no evidence of ischemia. There is no evidence of infarction.   Left ventricular function is normal. End diastolic cavity size is normal. End systolic cavity size is normal.   CT continuation images showed moderate aortic calcifications and mild coronary calcifications.   ECHOCARDIOGRAM  ECHOCARDIOGRAM COMPLETE 09/19/2023  Narrative ECHOCARDIOGRAM REPORT    Patient Name:   Linda Brady Date of Exam: 09/19/2023 Medical Rec #:  [id number]         Height:       64.0 in Accession #:    [id number]        Weight:       177.2 lb Date of Birth:  12/01/1941        BSA:          1.858 m Patient Age:    81 years          BP:           119/61 mmHg Patient  Gender: F                 HR:           73 bpm. Exam  Location:  [facility]  Procedure: 2D Echo, Cardiac Doppler and Color Doppler  Indications:     Atrial Fibrillaiton  History:         Patient has prior history of Echocardiogram examinations, most recent 12/15/2021. CHF, CAD and Angina, COPD, Arrythmias:Atrial Fibrillation, Signs/Symptoms:Chest Pain and Shortness of Breath; Risk Factors:Hypertension and Dyslipidemia.  Sonographer:     Mikki Harbor Referring Phys:  [id number] Sunnie Nielsen Diagnosing Phys: Debbe Odea MD   Sonographer Comments: Image acquisition challenging due to respiratory motion. IMPRESSIONS   1. Left ventricular ejection fraction, by estimation, is 65 to 70%. The left ventricle has normal function. The left ventricle has no regional wall motion abnormalities. Left ventricular diastolic parameters are consistent with Grade II diastolic dysfunction (pseudonormalization). 2. Right ventricular systolic function is normal. The right ventricular size is normal. There is mildly elevated pulmonary artery systolic pressure. 3. The mitral valve is degenerative. Mild mitral valve regurgitation. 4. The aortic valve is calcified. Aortic valve regurgitation is mild. Severe aortic valve stenosis. Aortic valve area, by VTI measures 0.99 cm. Aortic valve mean gradient measures 49.2 mmHg. Aortic valve Vmax measures 4.56 m/s. 5. The inferior vena cava is normal in size with greater than 50% respiratory variability, suggesting right atrial pressure of 3 mmHg.  Conclusion(s)/Recommendation(s): Findings consistent with severe valvular heart disease.  FINDINGS Left Ventricle: Left ventricular ejection fraction, by estimation, is 65 to 70%. The left ventricle has normal function. The left ventricle has no regional wall motion abnormalities. The left ventricular internal cavity size was normal in size. There is no left ventricular hypertrophy. Left ventricular diastolic parameters are consistent with Grade II diastolic dysfunction  (pseudonormalization).  Right Ventricle: The right ventricular size is normal. No increase in right ventricular wall thickness. Right ventricular systolic function is normal. There is mildly elevated pulmonary artery systolic pressure. The tricuspid regurgitant velocity is 2.71 m/s, and with an assumed right atrial pressure of 8 mmHg, the estimated right ventricular systolic pressure is 37.4 mmHg.  Left Atrium: Left atrial size was normal in size.  Right Atrium: Right atrial size was normal in size.  Pericardium: There is no evidence of pericardial effusion.  Mitral Valve: The mitral valve is degenerative in appearance. Mild to moderate mitral annular calcification. Mild mitral valve regurgitation. MV peak gradient, 7.5 mmHg. The mean mitral valve gradient is 3.0 mmHg.  Tricuspid Valve: The tricuspid valve is normal in structure. Tricuspid valve regurgitation is mild.  Aortic Valve: The aortic valve is calcified. Aortic valve regurgitation is mild. Aortic regurgitation PHT measures 436 msec. Severe aortic stenosis is present. Aortic valve mean gradient measures 49.2 mmHg. Aortic valve peak gradient measures 83.1 mmHg. Aortic valve area, by VTI measures 0.99 cm.  Pulmonic Valve: The pulmonic valve was not well visualized. Pulmonic valve regurgitation is trivial.  Aorta: The aortic root and ascending aorta are structurally normal, with no evidence of dilitation.  Venous: The inferior vena cava is normal in size with greater than 50% respiratory variability, suggesting right atrial pressure of 3 mmHg.  IAS/Shunts: No atrial level shunt detected by color flow Doppler.   LEFT VENTRICLE PLAX 2D LVIDd:         3.60 cm   Diastology LVIDs:         2.40 cm   LV e' medial:  7.45 cm/s LV PW:         1.30 cm   LV E/e' medial:  15.7 LV IVS:        1.30 cm   LV e' lateral:   6.92 cm/s LVOT diam:     2.00 cm   LV E/e' lateral: 16.9 LV SV:         106 LV SV Index:   57 LVOT Area:     3.14  cm   RIGHT VENTRICLE RV Basal diam:  3.25 cm RV Mid diam:    2.90 cm RV S prime:     12.60 cm/s TAPSE (M-mode): 2.6 cm  LEFT ATRIUM             Index        RIGHT ATRIUM           Index LA diam:        4.50 cm 2.42 cm/m   RA Area:     15.10 cm LA Vol (A2C):   63.0 ml 33.90 ml/m  RA Volume:   40.70 ml  21.90 ml/m LA Vol (A4C):   45.5 ml 24.48 ml/m LA Biplane Vol: 56.5 ml 30.40 ml/m AORTIC VALVE AV Area (Vmax):    1.03 cm AV Area (Vmean):   0.97 cm AV Area (VTI):     0.99 cm AV Vmax:           455.80 cm/s AV Vmean:          322.600 cm/s AV VTI:            1.079 m AV Peak Grad:      83.1 mmHg AV Mean Grad:      49.2 mmHg LVOT Vmax:         150.00 cm/s LVOT Vmean:        99.600 cm/s LVOT VTI:          0.338 m LVOT/AV VTI ratio: 0.31 AI PHT:            436 msec  AORTA Ao Root diam: 3.10 cm Ao Asc diam:  2.80 cm  MITRAL VALVE                TRICUSPID VALVE MV Area (PHT): 2.90 cm     TR Peak grad:   29.4 mmHg MV Area VTI:   2.62 cm     TR Vmax:        271.00 cm/s MV Peak grad:  7.5 mmHg MV Mean grad:  3.0 mmHg     SHUNTS MV Vmax:       1.37 m/s     Systemic VTI:  0.34 m MV Vmean:      79.3 cm/s    Systemic Diam: 2.00 cm MV Decel Time: 262 msec MV E velocity: 117.00 cm/s MV A velocity: 96.90 cm/s MV E/A ratio:  1.21  Debbe Odea MD Electronically signed by Debbe Odea MD Signature Date/Time: 09/19/2023/4:30:06 PM    Final             09/18/2023: ALT 12; Magnesium 1.9; TSH 1.152 10/01/2023: BNP 217.2; Hemoglobin 11.2; Platelets 392 10/13/2023: BUN 14; Creatinine, Ser 1.03; Potassium 4.6; Sodium 137   STS RISK CALCULATOR: Pending  NHYA CLASS: 2    ASSESSMENT AND PLAN:   Aortic valve stenosis, etiology of cardiac valve disease unspecified - Plan: EKG 12-Lead  Chronic heart failure with preserved ejection fraction (HFpEF) (HCC) - Plan: Pro b natriuretic peptide (BNP)  Stage 3a chronic kidney disease (  HCC)  Paroxysmal atrial  fibrillation (HCC)  Secondary hypercoagulable state (HCC)  Mild CAD  The patient has developed severe symptomatic aortic stenosis.  She is quite limited and would very much like to feel better.  She does have competing etiologies for shortness of breath including COPD.  It would seem to me though that her aortic stenosis is driving her symptomatology over the last year or so.  This did culminate in an admission with acute on chronic diastolic heart failure and atrial fibrillation.  Given her severe aortic stenosis with NYHA II symptoms, we will refer her for coronary angiography and right heart catheterization study, CTA, and cardiothoracic surgical opinion to adjudicate about SAVR versus TAVR.  Further recommendations will be issued following review of these imaging studies.  I have personally reviewed the patients imaging data as summarized above.  I have reviewed the natural history of aortic stenosis with the patient and family members who are present today. We have discussed the limitations of medical therapy and the poor prognosis associated with symptomatic aortic stenosis. We have also reviewed potential treatment options, including palliative medical therapy, conventional surgical aortic valve replacement, and transcatheter aortic valve replacement. We discussed treatment options in the context of this patient's specific comorbid medical conditions.   All of the patient's questions were answered today. Will make further recommendations based on the results of studies outlined above.   Total time spent with patient today 55 minutes. This includes reviewing records, evaluating the patient and coordinating care.   Orbie Pyo, MD  10/15/2023 2:56 PM    [facility] [address] [place], [place], [place]  [address] Phone: [phone]; Fax: [phone]

## 2023-10-16 LAB — PRO B NATRIURETIC PEPTIDE: NT-Pro BNP: 533 pg/mL (ref 0–738)

## 2023-10-21 ENCOUNTER — Telehealth: Payer: Self-pay | Admitting: *Deleted

## 2023-10-21 NOTE — Telephone Encounter (Signed)
Cardiac Catheterization scheduled at [facility] for: Thursday October 23, 2023 12 noon Arrival time [facility] Main Entrance A at: 10 AM  Nothing to eat after midnight prior to procedure, clear liquids until 5 AM day of procedure.  Medication instructions: -Hold:  Eliquis-pt reports she did not take starting 10/20/23 and knows to hold until post procedure  Lasix/hydrochlorothiazide-AM of procedure -Other usual morning medications can be taken with sips of water including aspirin 81 mg.  Plan to go home the same day, you will only stay overnight if medically necessary.  You must have responsible adult to drive you home.  Someone must be with you the first 24 hours after you arrive home.  Reviewed procedure instructions with patient.

## 2023-10-22 ENCOUNTER — Other Ambulatory Visit: Payer: Self-pay

## 2023-10-22 DIAGNOSIS — I350 Nonrheumatic aortic (valve) stenosis: Secondary | ICD-10-CM

## 2023-10-23 ENCOUNTER — Inpatient Hospital Stay (HOSPITAL_COMMUNITY)
Admission: EM | Admit: 2023-10-23 | Discharge: 2023-10-28 | DRG: 253 | Disposition: A | Discharge status: Home Health | Payer: Medicare Other | Attending: Family Medicine | Admitting: Family Medicine

## 2023-10-23 ENCOUNTER — Encounter (HOSPITAL_COMMUNITY): Payer: Self-pay

## 2023-10-23 ENCOUNTER — Other Ambulatory Visit: Payer: Self-pay

## 2023-10-23 ENCOUNTER — Ambulatory Visit (HOSPITAL_COMMUNITY)
Admission: RE | Admit: 2023-10-23 | Discharge: 2023-10-23 | Disposition: A | Discharge status: Home / Self Care | Payer: Medicare Other | Source: Home / Self Care | Attending: Internal Medicine | Admitting: Internal Medicine

## 2023-10-23 ENCOUNTER — Encounter (HOSPITAL_COMMUNITY)
Admission: RE | Disposition: A | Discharge status: Home / Self Care | Payer: Self-pay | Source: Home / Self Care | Attending: Internal Medicine

## 2023-10-23 ENCOUNTER — Emergency Department (HOSPITAL_COMMUNITY): Payer: Medicare Other

## 2023-10-23 ENCOUNTER — Other Ambulatory Visit: Payer: Self-pay | Admitting: Physician Assistant

## 2023-10-23 DIAGNOSIS — I480 Paroxysmal atrial fibrillation: Secondary | ICD-10-CM | POA: Diagnosis present

## 2023-10-23 DIAGNOSIS — Z9842 Cataract extraction status, left eye: Secondary | ICD-10-CM

## 2023-10-23 DIAGNOSIS — I130 Hypertensive heart and chronic kidney disease with heart failure and stage 1 through stage 4 chronic kidney disease, or unspecified chronic kidney disease: Secondary | ICD-10-CM | POA: Insufficient documentation

## 2023-10-23 DIAGNOSIS — I350 Nonrheumatic aortic (valve) stenosis: Secondary | ICD-10-CM | POA: Diagnosis present

## 2023-10-23 DIAGNOSIS — I7776 Dissection of artery of upper extremity: Secondary | ICD-10-CM | POA: Diagnosis not present

## 2023-10-23 DIAGNOSIS — I5032 Chronic diastolic (congestive) heart failure: Secondary | ICD-10-CM | POA: Insufficient documentation

## 2023-10-23 DIAGNOSIS — G2581 Restless legs syndrome: Secondary | ICD-10-CM | POA: Diagnosis present

## 2023-10-23 DIAGNOSIS — Z7902 Long term (current) use of antithrombotics/antiplatelets: Secondary | ICD-10-CM

## 2023-10-23 DIAGNOSIS — I2510 Atherosclerotic heart disease of native coronary artery without angina pectoris: Secondary | ICD-10-CM | POA: Diagnosis not present

## 2023-10-23 DIAGNOSIS — E785 Hyperlipidemia, unspecified: Secondary | ICD-10-CM | POA: Diagnosis present

## 2023-10-23 DIAGNOSIS — I4891 Unspecified atrial fibrillation: Secondary | ICD-10-CM | POA: Diagnosis present

## 2023-10-23 DIAGNOSIS — Z7901 Long term (current) use of anticoagulants: Secondary | ICD-10-CM | POA: Insufficient documentation

## 2023-10-23 DIAGNOSIS — Z7951 Long term (current) use of inhaled steroids: Secondary | ICD-10-CM

## 2023-10-23 DIAGNOSIS — E876 Hypokalemia: Secondary | ICD-10-CM | POA: Diagnosis not present

## 2023-10-23 DIAGNOSIS — I721 Aneurysm of artery of upper extremity: Principal | ICD-10-CM | POA: Diagnosis present

## 2023-10-23 DIAGNOSIS — Z7982 Long term (current) use of aspirin: Secondary | ICD-10-CM

## 2023-10-23 DIAGNOSIS — N1831 Chronic kidney disease, stage 3a: Secondary | ICD-10-CM | POA: Insufficient documentation

## 2023-10-23 DIAGNOSIS — Z888 Allergy status to other drugs, medicaments and biological substances status: Secondary | ICD-10-CM

## 2023-10-23 DIAGNOSIS — G8929 Other chronic pain: Secondary | ICD-10-CM | POA: Diagnosis present

## 2023-10-23 DIAGNOSIS — I25119 Atherosclerotic heart disease of native coronary artery with unspecified angina pectoris: Secondary | ICD-10-CM | POA: Insufficient documentation

## 2023-10-23 DIAGNOSIS — Z79890 Hormone replacement therapy (postmenopausal): Secondary | ICD-10-CM

## 2023-10-23 DIAGNOSIS — E039 Hypothyroidism, unspecified: Secondary | ICD-10-CM | POA: Diagnosis present

## 2023-10-23 DIAGNOSIS — D6869 Other thrombophilia: Secondary | ICD-10-CM | POA: Insufficient documentation

## 2023-10-23 DIAGNOSIS — J449 Chronic obstructive pulmonary disease, unspecified: Secondary | ICD-10-CM | POA: Insufficient documentation

## 2023-10-23 DIAGNOSIS — F411 Generalized anxiety disorder: Secondary | ICD-10-CM | POA: Diagnosis present

## 2023-10-23 DIAGNOSIS — E871 Hypo-osmolality and hyponatremia: Secondary | ICD-10-CM | POA: Diagnosis present

## 2023-10-23 DIAGNOSIS — Z87891 Personal history of nicotine dependence: Secondary | ICD-10-CM | POA: Insufficient documentation

## 2023-10-23 DIAGNOSIS — Z961 Presence of intraocular lens: Secondary | ICD-10-CM | POA: Diagnosis present

## 2023-10-23 DIAGNOSIS — I2489 Other forms of acute ischemic heart disease: Secondary | ICD-10-CM | POA: Diagnosis present

## 2023-10-23 DIAGNOSIS — F17210 Nicotine dependence, cigarettes, uncomplicated: Secondary | ICD-10-CM | POA: Diagnosis present

## 2023-10-23 DIAGNOSIS — F32A Depression, unspecified: Secondary | ICD-10-CM | POA: Diagnosis present

## 2023-10-23 DIAGNOSIS — I10 Essential (primary) hypertension: Secondary | ICD-10-CM | POA: Diagnosis present

## 2023-10-23 DIAGNOSIS — Z9981 Dependence on supplemental oxygen: Secondary | ICD-10-CM

## 2023-10-23 DIAGNOSIS — Z6833 Body mass index (BMI) 33.0-33.9, adult: Secondary | ICD-10-CM | POA: Insufficient documentation

## 2023-10-23 DIAGNOSIS — K219 Gastro-esophageal reflux disease without esophagitis: Secondary | ICD-10-CM | POA: Diagnosis present

## 2023-10-23 DIAGNOSIS — I959 Hypotension, unspecified: Secondary | ICD-10-CM | POA: Diagnosis present

## 2023-10-23 DIAGNOSIS — F419 Anxiety disorder, unspecified: Secondary | ICD-10-CM | POA: Diagnosis present

## 2023-10-23 DIAGNOSIS — Z79899 Other long term (current) drug therapy: Secondary | ICD-10-CM

## 2023-10-23 HISTORY — PX: RIGHT/LEFT HEART CATH AND CORONARY ANGIOGRAPHY: CATH118266

## 2023-10-23 LAB — POCT I-STAT 7, (LYTES, BLD GAS, ICA,H+H)
Acid-Base Excess: 3 mmol/L — ABNORMAL HIGH (ref 0.0–2.0)
Bicarbonate: 29.2 mmol/L — ABNORMAL HIGH (ref 20.0–28.0)
Calcium, Ion: 1.19 mmol/L (ref 1.15–1.40)
HCT: 32 % — ABNORMAL LOW (ref 36.0–46.0)
Hemoglobin: 10.9 g/dL — ABNORMAL LOW (ref 12.0–15.0)
O2 Saturation: 98 %
Potassium: 3.3 mmol/L — ABNORMAL LOW (ref 3.5–5.1)
Sodium: 137 mmol/L (ref 135–145)
TCO2: 31 mmol/L (ref 22–32)
pCO2 arterial: 49.6 mm[Hg] — ABNORMAL HIGH (ref 32–48)
pH, Arterial: 7.377 (ref 7.35–7.45)
pO2, Arterial: 112 mm[Hg] — ABNORMAL HIGH (ref 83–108)

## 2023-10-23 LAB — POCT I-STAT EG7
Acid-Base Excess: 3 mmol/L — ABNORMAL HIGH (ref 0.0–2.0)
Acid-Base Excess: 3 mmol/L — ABNORMAL HIGH (ref 0.0–2.0)
Bicarbonate: 29 mmol/L — ABNORMAL HIGH (ref 20.0–28.0)
Bicarbonate: 29.2 mmol/L — ABNORMAL HIGH (ref 20.0–28.0)
Calcium, Ion: 1.13 mmol/L — ABNORMAL LOW (ref 1.15–1.40)
Calcium, Ion: 1.21 mmol/L (ref 1.15–1.40)
HCT: 31 % — ABNORMAL LOW (ref 36.0–46.0)
HCT: 32 % — ABNORMAL LOW (ref 36.0–46.0)
Hemoglobin: 10.5 g/dL — ABNORMAL LOW (ref 12.0–15.0)
Hemoglobin: 10.9 g/dL — ABNORMAL LOW (ref 12.0–15.0)
O2 Saturation: 72 %
O2 Saturation: 80 %
Potassium: 3.2 mmol/L — ABNORMAL LOW (ref 3.5–5.1)
Potassium: 3.3 mmol/L — ABNORMAL LOW (ref 3.5–5.1)
Sodium: 137 mmol/L (ref 135–145)
Sodium: 139 mmol/L (ref 135–145)
TCO2: 31 mmol/L (ref 22–32)
TCO2: 31 mmol/L (ref 22–32)
pCO2, Ven: 50.2 mm[Hg] (ref 44–60)
pCO2, Ven: 50.7 mm[Hg] (ref 44–60)
pH, Ven: 7.366 (ref 7.25–7.43)
pH, Ven: 7.373 (ref 7.25–7.43)
pO2, Ven: 40 mm[Hg] (ref 32–45)
pO2, Ven: 46 mm[Hg] — ABNORMAL HIGH (ref 32–45)

## 2023-10-23 LAB — CBC
HCT: 31.8 % — ABNORMAL LOW (ref 36.0–46.0)
Hemoglobin: 10.4 g/dL — ABNORMAL LOW (ref 12.0–15.0)
MCH: 30.3 pg (ref 26.0–34.0)
MCHC: 32.7 g/dL (ref 30.0–36.0)
MCV: 92.7 fL (ref 80.0–100.0)
Platelets: 382 10*3/uL (ref 150–400)
RBC: 3.43 MIL/uL — ABNORMAL LOW (ref 3.87–5.11)
RDW: 13.8 % (ref 11.5–15.5)
WBC: 12.1 10*3/uL — ABNORMAL HIGH (ref 4.0–10.5)
nRBC: 0 % (ref 0.0–0.2)

## 2023-10-23 LAB — BASIC METABOLIC PANEL
Anion gap: 9 (ref 5–15)
BUN: 9 mg/dL (ref 8–23)
CO2: 24 mmol/L (ref 22–32)
Calcium: 9.2 mg/dL (ref 8.9–10.3)
Chloride: 97 mmol/L — ABNORMAL LOW (ref 98–111)
Creatinine, Ser: 0.88 mg/dL (ref 0.44–1.00)
GFR, Estimated: >60 mL/min (ref >60)
Glucose, Bld: 131 mg/dL — ABNORMAL HIGH (ref 70–99)
Potassium: 3.6 mmol/L (ref 3.5–5.1)
Sodium: 130 mmol/L — ABNORMAL LOW (ref 135–145)

## 2023-10-23 SURGERY — RIGHT/LEFT HEART CATH AND CORONARY ANGIOGRAPHY
Anesthesia: LOCAL

## 2023-10-23 MED ORDER — HEPARIN SODIUM (PORCINE) 1000 UNIT/ML IJ SOLN
INTRAMUSCULAR | Status: DC | PRN
  Administered 2023-10-23: 5000 [IU] via INTRA_ARTERIAL

## 2023-10-23 MED ORDER — IOHEXOL 350 MG/ML SOLN
90.0000 mL | Freq: Once | INTRAVENOUS | Status: AC | PRN
  Administered 2023-10-23: 90 mL via INTRAVENOUS

## 2023-10-23 MED ORDER — SODIUM CHLORIDE 0.9 % WEIGHT BASED INFUSION: 1.0000 mL/kg/h | INTRAVENOUS | Status: DC

## 2023-10-23 MED ORDER — MIDAZOLAM HCL 2 MG/2ML IJ SOLN
INTRAMUSCULAR | Status: AC
  Filled 2023-10-23: qty 2

## 2023-10-23 MED ORDER — HEPARIN (PORCINE) IN NACL 1000-0.9 UT/500ML-% IV SOLN
INTRAVENOUS | Status: DC | PRN
  Administered 2023-10-23 (×2): 500 mL

## 2023-10-23 MED ORDER — FUROSEMIDE 20 MG PO TABS: 20.0000 mg | ORAL_TABLET | Freq: Two times a day (BID) | ORAL | 3 refills | Status: DC

## 2023-10-23 MED ORDER — VERAPAMIL HCL 2.5 MG/ML IV SOLN
INTRAVENOUS | Status: AC
  Filled 2023-10-23: qty 2

## 2023-10-23 MED ORDER — SODIUM CHLORIDE 0.9 % IV SOLN
INTRAVENOUS | Status: DC | PRN
  Administered 2023-10-23: 10 mL/h via INTRAVENOUS

## 2023-10-23 MED ORDER — SODIUM CHLORIDE 0.9 % IV SOLN: 250.0000 mL | INTRAVENOUS | Status: DC | PRN

## 2023-10-23 MED ORDER — ONDANSETRON HCL 4 MG/2ML IJ SOLN: 4.0000 mg | Freq: Four times a day (QID) | INTRAMUSCULAR | Status: DC | PRN

## 2023-10-23 MED ORDER — SODIUM CHLORIDE 0.9% FLUSH: 3.0000 mL | INTRAVENOUS | Status: DC | PRN

## 2023-10-23 MED ORDER — ACETAMINOPHEN 325 MG PO TABS
650.0000 mg | ORAL_TABLET | ORAL | Status: DC | PRN
  Administered 2023-10-23: 650 mg via ORAL
  Filled 2023-10-23: qty 2

## 2023-10-23 MED ORDER — VERAPAMIL HCL 2.5 MG/ML IV SOLN
INTRAVENOUS | Status: DC | PRN
  Administered 2023-10-23: 10 mL via INTRA_ARTERIAL

## 2023-10-23 MED ORDER — ASPIRIN 81 MG PO CHEW: 81.0000 mg | CHEWABLE_TABLET | ORAL | Status: DC

## 2023-10-23 MED ORDER — MIDAZOLAM HCL 2 MG/2ML IJ SOLN
INTRAMUSCULAR | Status: DC | PRN
  Administered 2023-10-23: 1 mg via INTRAVENOUS

## 2023-10-23 MED ORDER — HEPARIN SODIUM (PORCINE) 1000 UNIT/ML IJ SOLN
INTRAMUSCULAR | Status: AC
  Filled 2023-10-23: qty 10

## 2023-10-23 MED ORDER — SODIUM CHLORIDE 0.9% FLUSH: 3.0000 mL | Freq: Two times a day (BID) | INTRAVENOUS | Status: DC

## 2023-10-23 MED ORDER — SODIUM CHLORIDE 0.9 % WEIGHT BASED INFUSION: 3.0000 mL/kg/h | INTRAVENOUS | Status: DC

## 2023-10-23 MED ORDER — LABETALOL HCL 5 MG/ML IV SOLN: 10.0000 mg | INTRAVENOUS | Status: DC | PRN

## 2023-10-23 MED ORDER — HYDROCODONE-ACETAMINOPHEN 5-325 MG PO TABS
2.0000 | ORAL_TABLET | Freq: Once | ORAL | Status: AC
  Administered 2023-10-23: 2 via ORAL
  Filled 2023-10-23: qty 2

## 2023-10-23 MED ORDER — HYDRALAZINE HCL 20 MG/ML IJ SOLN: 10.0000 mg | INTRAMUSCULAR | Status: DC | PRN

## 2023-10-23 MED ORDER — LIDOCAINE HCL (PF) 1 % IJ SOLN
INTRAMUSCULAR | Status: DC | PRN
  Administered 2023-10-23 (×2): 5 mL

## 2023-10-23 MED ORDER — FENTANYL CITRATE (PF) 100 MCG/2ML IJ SOLN
INTRAMUSCULAR | Status: DC | PRN
  Administered 2023-10-23: 25 ug via INTRAVENOUS

## 2023-10-23 MED ORDER — FENTANYL CITRATE (PF) 100 MCG/2ML IJ SOLN
INTRAMUSCULAR | Status: AC
  Filled 2023-10-23: qty 2

## 2023-10-23 SURGICAL SUPPLY — 14 items
CATH BALLN WEDGE 5F 110CM (CATHETERS) IMPLANT
CATH DIAG 6FR JR4 (CATHETERS) IMPLANT
CATH DIAG 6FR PIGTAIL ANGLED (CATHETERS) IMPLANT
CATH INFINITI AMBI 6FR TG (CATHETERS) IMPLANT
COVER DOME SNAP 22 D (MISCELLANEOUS) IMPLANT
DEVICE RAD COMP TR BAND LRG (VASCULAR PRODUCTS) IMPLANT
GLIDESHEATH SLEND SS 6F .021 (SHEATH) IMPLANT
GLIDEWIRE ANGLED SS 035X260CM (WIRE) IMPLANT
PACK CARDIAC CATHETERIZATION (CUSTOM PROCEDURE TRAY) ×1 IMPLANT
SET ATX-X65L (MISCELLANEOUS) IMPLANT
SHEATH GLIDE SLENDER 4/5FR (SHEATH) IMPLANT
SHEATH PROBE COVER 6X72 (BAG) IMPLANT
WIRE EMERALD 3MM-J .035X260CM (WIRE) IMPLANT
WIRE MICRO SET SILHO 5FR 7 (SHEATH) IMPLANT

## 2023-10-23 NOTE — Progress Notes (Addendum)
  [facility]   MULTIDISCIPLINARY HEART VALVE TEAM  Patient underwent uncomplicated R/LHC today. Called by short stay RN to assess patients right UE. Noted to have unilateral upper arm and axilla swelling with no overt evidence of hematoma. RN reports manual pressure held prior to calling with improved symptoms. Cardiology PA called Dr. Lynnette Caffey who is currently in another procedure in the cath lab. Although low suspicion, plan upper extremity US to r/o psuedo. Messaged Dr. Lynnette Caffey to see the patient after his case.   ADDEND: Dr. Lynnette Caffey saw the patient and plan will be to hold Eliquis until Monday. This was added to her discharge instructions and communicated with the discharging nurse. No imaging needed at this time. Order cancelled.   Georgie Chard NP-C Structural Heart Team  Pager: [phone] Phone: [phone]

## 2023-10-23 NOTE — ED Provider Notes (Signed)
[place] EMERGENCY DEPARTMENT AT [facility] Provider Note   CSN: [id number] Arrival date & time: 10/23/23  1927     History  Chief Complaint  Patient presents with   Post-op Problem    Linda Brady is a 82 y.o. female.  The history is provided by the patient, a relative and medical records.  Linda Brady is a 82 y.o. female who presents to the Emergency Department complaining of arm pain.  She presents to the emergency department for evaluation of right arm pain.  She had a heart cath at noon today and after the procedure she did have a hematoma in her arm.  She was discharged around 4:30 in the afternoon.  Around 630 she developed increased pain and nausea.  She also reports some numbness to her forearm.  She did receive pain medications at time of ED arrival, which helped her symptoms.  No difficulty breathing, fevers.  She has chronic abdominal pain and this is baseline.       Home Medications Prior to Admission medications   Medication Sig Start Date End Date Taking? Authorizing Provider  ALPRAZolam Prudy Feeler) 0.25 MG tablet Take 0.25 mg by mouth at bedtime as needed for sleep.   Yes [provider]  atorvastatin (LIPITOR) 40 MG tablet Take 1 tablet (40 mg total) by mouth daily. 09/20/23  Yes Sunnie Nielsen, DO  budesonide-formoterol [facility]) 160-4.5 MCG/ACT inhaler Inhale 2 puffs into the lungs 2 (two) times daily. 09/18/23  Yes Mikey College T, MD  cholecalciferol (VITAMIN D3) 25 MCG (1000 UNIT) tablet Take 1,000 Units by mouth daily.   Yes [provider]  diltiazem (CARDIZEM CD) 120 MG 24 hr capsule Take 1 capsule (120 mg total) by mouth at bedtime. Start PM 09/19/23 Patient taking differently: Take 120 mg by mouth in the morning. Start PM 09/19/23 09/19/23 09/18/24 Yes Alexander, Dorene Grebe, DO  enalapril (VASOTEC) 20 MG tablet Take 1 tablet (20 mg total) by mouth 2 (two) times daily. 09/10/22  Yes Furth, Cadence H, PA-C  FLUoxetine  (PROZAC) 20 MG capsule Take 20 mg by mouth daily as needed (anxiety). 03/14/20  Yes [provider]  hydrochlorothiazide (HYDRODIURIL) 25 MG tablet Take 25 mg by mouth daily.   Yes [provider]  Ipratropium-Albuterol (COMBIVENT RESPIMAT) 20-100 MCG/ACT AERS respimat Inhale 1 puff into the lungs every 6 (six) hours as needed for wheezing.   Yes [provider]  isosorbide mononitrate (IMDUR) 30 MG 24 hr tablet Take 1 tablet (30 mg total) by mouth daily. 10/14/22  Yes Furth, Cadence H, PA-C  levothyroxine (SYNTHROID) 75 MCG tablet Take 75 mcg by mouth daily. 05/02/20  Yes [provider]  pantoprazole (PROTONIX) 40 MG tablet Take 1 tablet (40 mg total) by mouth 2 (two) times daily. 09/19/23  Yes Sunnie Nielsen, DO  vitamin B-12 (CYANOCOBALAMIN) 1000 MCG tablet Take 1,000 mcg by mouth daily.   Yes [provider]  apixaban (ELIQUIS) 5 MG TABS tablet Take 1 tablet (5 mg total) by mouth 2 (two) times daily. Start PM 09/19/23 09/19/23   Sunnie Nielsen, DO  furosemide (LASIX) 20 MG tablet Take 1 tablet (20 mg total) by mouth 2 (two) times daily. Patient not taking: Reported on 10/24/2023 10/23/23 01/21/24  Orbie Pyo, MD  metoprolol tartrate (LOPRESSOR) 50 MG tablet Take one tablet by mouth 2 hours before CT scan Patient not taking: Reported on 10/24/2023 10/15/23   Orbie Pyo, MD      Allergies  Naproxen sodium    Review of Systems   Review of Systems  All other systems reviewed and are negative.   Physical Exam Updated Vital Signs BP (!) 145/77 (BP Location: Left Arm)   Pulse 79   Temp 97.7 F (36.5 C) (Oral)   Resp 20   SpO2 98%  Physical Exam Vitals and nursing note reviewed.  Constitutional:      Appearance: She is well-developed.  HENT:     Head: Normocephalic and atraumatic.  Neck:     Comments: Dressing in right anterior neck c/d/i Cardiovascular:     Rate and Rhythm: Normal rate and regular rhythm.     Heart  sounds: Murmur heard.  Pulmonary:     Effort: Pulmonary effort is normal. No respiratory distress.     Breath sounds: Normal breath sounds.  Abdominal:     Palpations: Abdomen is soft.     Tenderness: There is no guarding or rebound.     Comments: Mild generalized abdominal tenderness  Musculoskeletal:        General: No tenderness.     Comments: Swelling and ecchymosis to right upper inner arm with mild tenderness. 2+ right radial pulse  Skin:    General: Skin is warm and dry.  Neurological:     Mental Status: She is alert and oriented to person, place, and time.     Comments: 4+ out of five grip strength in the RUE.  Altered to sensation to light touch in the forearm, inner upper arm.    Psychiatric:        Behavior: Behavior normal.     ED Results / Procedures / Treatments   Labs (all labs ordered are listed, but only abnormal results are displayed) Labs Reviewed  CBC - Abnormal; Notable for the following components:      Result Value   WBC 12.1 (*)    RBC 3.43 (*)    Hemoglobin 10.4 (*)    HCT 31.8 (*)    All other components within normal limits  BASIC METABOLIC PANEL - Abnormal; Notable for the following components:   Sodium 130 (*)    Chloride 97 (*)    Glucose, Bld 131 (*)    All other components within normal limits  HEMOGLOBIN AND HEMATOCRIT, BLOOD - Abnormal; Notable for the following components:   Hemoglobin 10.2 (*)    HCT 31.6 (*)    All other components within normal limits    EKG None  Radiology CT ANGIO UP EXTREM RIGHT W &/OR WO CONTRAST  Result Date: 10/23/2023 CLINICAL DATA:  History of recent catheterization via the right radial artery with increasing arm pain and numbness initial encounter EXAM: CT ANGIOGRAPHY OF THE RIGHT UPPEREXTREMITY TECHNIQUE: Multidetector CT imaging of the right upper extremitywas performed using the standard protocol during bolus administration of intravenous contrast. Multiplanar CT image reconstructions and MIPs were  obtained to evaluate the vascular anatomy. RADIATION DOSE REDUCTION: This exam was performed according to the departmental dose-optimization program which includes automated exposure control, adjustment of the mA and/or kV according to patient size and/or use of iterative reconstruction technique. CONTRAST:  90mL OMNIPAQUE IOHEXOL 350 MG/ML SOLN COMPARISON:  None Available. FINDINGS: Vascular: The thoracic aorta demonstrates atherosclerotic calcifications without aneurysmal dilatation or dissection. Coronary calcifications are seen. No significant cardiac enlargement is noted. No pulmonary emboli are seen. Right innominate artery demonstrates mild atherosclerotic calcifications. Normal bifurcation into the right common carotid artery and right subclavian artery is noted. Axillary artery on the right appears  within normal limits. The origins of the subscapular artery and posterior circumflex humeral artery are well visualized and within normal limits. Just beyond this level in the proximal aspect of the brachial artery there is a focal area of dissection identified best visualized from images number 155 to 177 of series 6. A small branch vessel arises from the false lumen and supplies a somewhat bilobed collection of contrast measuring up to 18 mm in greatest transverse dimension. The bilobed nature of the pseudoaneurysm measures up to 3.3 cm in craniocaudad projection. These changes are likely related to a pseudoaneurysm arising from the branch vessel. The more distal brachial artery appears within normal limits. Bifurcation into the radial and ulnar arteries appear within normal limits. The radial and ulnar arteries are visualized patent to the level of right wrist. Nonvascular: Visualized lung fields appear within normal limits with the exception of mild scarring in the right middle lobe. Visualized portions of the liver and right kidney show no acute abnormality. A hiatal hernia is noted of a moderate degree.  Visualized abdominal and pelvic structures are within normal limits. No acute bony abnormality is noted. 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 Considerable subcutaneous edema is noted in the upper arm and axillary region. This may represent some mild prior hemorrhage as well. Review of the MIP images confirms the above findings. IMPRESSION: Focal area of dissection in the proximal aspect of the brachial artery just beyond the origin of the subscapular artery. Arising from the false lumen there is a small branch vessel identified with supply of a bilobed pseudoaneurysm in the medial aspect of the right upper arm/axilla. Some local compression on the adjacent nerves may contribute to the patient's peripheral numbness. Vascular surgery consultation is recommended. Patent distal brachial artery as well as patent radial and ulnar arteries. No acute nonvascular abnormality is noted. Critical Value/emergent results were called by telephone at the time of interpretation on 10/23/2023 at 11:39 pm to [facility] MD, who verbally acknowledged these results. Electronically Signed   By: Alcide Clever M.D.   On: 10/23/2023 23:39   CARDIAC CATHETERIZATION  Result Date: 10/23/2023   Prox LAD lesion is 30% stenosed.   Ost RCA to Prox RCA lesion is 20% stenosed. 1.  Mild nonobstructive coronary artery disease. 2.  Fick cardiac output of 9.0 L/min and Fick cardiac index of 4.9 L/min/m with the following hemodynamics  Right atrial pressure mean of 7 mmHg  Right ventricular pressure 48/27 with end-diastolic pressure of 14 mmHg  PA pressure 48/17 with a mean of 29 mmHg  Wedge pressure mean of 18 mmHg V waves to 27 mmHg  PVR 1.2 Woods units  PA pulsatility index of 4 Recommendation: Continue evaluation for aortic valve intervention.    Procedures Procedures    Medications Ordered in ED Medications  HYDROcodone-acetaminophen (NORCO/VICODIN) 5-325 MG per tablet 2 tablet (2 tablets Oral Given  10/23/23 2038)  iohexol (OMNIPAQUE) 350 MG/ML injection 90 mL (90 mLs Intravenous Contrast Given 10/23/23 2301)  fentaNYL (SUBLIMAZE) injection 50 mcg (50 mcg Intravenous Given 10/24/23 0143)  oxyCODONE (Oxy IR/ROXICODONE) immediate release tablet 5 mg (5 mg Oral Given 10/24/23 0300)    ED Course/ Medical Decision Making/ A&P                                 Medical Decision Making Amount and/or Complexity of Data Reviewed Labs: ordered.  Risk Prescription drug  management. Decision regarding hospitalization.   Patient here for evaluation of increased arm pain and swelling with associated numbness after a heart catheter earlier today.  She does have altered sensation to the forearm, hematoma to the upper arm with a strong radial pulse.  CTA does demonstrate a pseudoaneurysm. D/w Dr. Hetty Blend with vascular surgery - recommends triad admit, npo, will see in am for further mgmt, possible procedure.  Hospitalist consulted for admission.  Patient updated findings of studies and recommendation for admission.       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

## 2023-10-23 NOTE — Progress Notes (Signed)
BMP 1 week.

## 2023-10-23 NOTE — ED Notes (Signed)
Patient transported to CT 

## 2023-10-23 NOTE — ED Provider Triage Note (Signed)
Emergency Medicine Provider Triage Evaluation Note  Linda Brady , a 82 y.o. female  was evaluated in triage.  Pt complains of right upper extremity pain, numbness, and swelling.  Patient had heart cath procedure done with access on the right side.  She has had worsening symptoms in her arm since the procedure.  She was evaluated by cardiology before being discharged.  [place] was considered at this time, but patient was evaluated, told to hold Eliquis until Monday and sent home.    Review of Systems  Positive: As above Negative: As above  Physical Exam  BP (!) 148/75 (BP Location: Left Arm)   Pulse 82   Temp 97.7 F (36.5 C) (Oral)   Resp 20   SpO2 93%  Gen:   Awake, no distress, appears very uncomfortable, holding her right arm Resp:  Normal effort  MSK:   Moves extremities without difficulty  Other:  Ecchymosis to the RUE with swelling and tenderness  Medical Decision Making  Medically screening exam initiated at 8:22 PM.  Appropriate orders placed.  Linda Brady was informed that the remainder of the evaluation will be completed by another provider, this initial triage assessment does not replace that evaluation, and the importance of remaining in the ED until their evaluation is complete.  Will order vascular US with doppler to assess.   Linda Brady, [place] 10/23/23 2024

## 2023-10-23 NOTE — Progress Notes (Signed)
Dr Lynnette Caffey in to see client and ok to d/c home; per Dr Lynnette Caffey client and her daughter notified to restart eliquis Monday 11/25 and voiced understanding

## 2023-10-23 NOTE — Progress Notes (Signed)
Mayford Knife, [place] and [place], MD notified of hematoma under R upper arm.

## 2023-10-23 NOTE — Progress Notes (Signed)
C/O under right upper arm feeling tight; firmness noted  under right upper arm; left upper arm soft; pressure held right upper arm and feels softer and client says it feels better, was sore

## 2023-10-23 NOTE — Interval H&P Note (Signed)
History and Physical Interval Note:  10/23/2023 9:56 AM  Linda Brady  has presented today for surgery, with the diagnosis of aortic stenosis.  The various methods of treatment have been discussed with the patient and family. After consideration of risks, benefits and other options for treatment, the patient has consented to  Procedure(s): RIGHT/LEFT HEART CATH AND CORONARY ANGIOGRAPHY (N/A) as a surgical intervention.  The patient's history has been reviewed, patient examined, no change in status, stable for surgery.  I have reviewed the patient's chart and labs.  Questions were answered to the patient's satisfaction.     Orbie Pyo

## 2023-10-23 NOTE — ED Triage Notes (Signed)
Seen today for heart cath. Afterwards in recovery pt alerted staff for RUE swelling, pain and numbness. Was assessed by Np/MD and had manual pressure applied.  Says they were planning for [place] but decided not to.   Instructed to hold eliquis until Monday.   After going home presented with worsening pain and right arm numbness.

## 2023-10-23 NOTE — ED Notes (Signed)
ED Provider at bedside for [facility]

## 2023-10-23 NOTE — Progress Notes (Signed)
Perlie Gold, PA in to see client

## 2023-10-23 NOTE — Discharge Instructions (Addendum)
Increase lasix to 20mg  twice a day, blood work in 1 week  Please HOLD ELIQUIS for 3 days then restart as normal on 11/25

## 2023-10-23 NOTE — Progress Notes (Signed)
Client c/o 7/10 headache and DrThukkani notified and will give tylenol as ordered

## 2023-10-23 NOTE — Progress Notes (Signed)
Linda Chard, NP in to see client and she will have Dr to see client

## 2023-10-24 ENCOUNTER — Telehealth: Payer: Self-pay | Admitting: Internal Medicine

## 2023-10-24 ENCOUNTER — Encounter (HOSPITAL_COMMUNITY): Payer: Self-pay | Admitting: Internal Medicine

## 2023-10-24 ENCOUNTER — Other Ambulatory Visit (HOSPITAL_COMMUNITY)

## 2023-10-24 ENCOUNTER — Encounter (HOSPITAL_COMMUNITY)
Admission: EM | Disposition: A | Discharge status: Home Health | Payer: Self-pay | Source: Home / Self Care | Attending: Family Medicine

## 2023-10-24 DIAGNOSIS — I7789 Other specified disorders of arteries and arterioles: Secondary | ICD-10-CM | POA: Diagnosis not present

## 2023-10-24 DIAGNOSIS — J449 Chronic obstructive pulmonary disease, unspecified: Secondary | ICD-10-CM | POA: Diagnosis not present

## 2023-10-24 DIAGNOSIS — Z79899 Other long term (current) drug therapy: Secondary | ICD-10-CM

## 2023-10-24 DIAGNOSIS — I7776 Dissection of artery of upper extremity: Secondary | ICD-10-CM | POA: Diagnosis present

## 2023-10-24 DIAGNOSIS — Q2549 Other congenital malformations of aorta: Secondary | ICD-10-CM

## 2023-10-24 DIAGNOSIS — I9789 Other postprocedural complications and disorders of the circulatory system, not elsewhere classified: Secondary | ICD-10-CM | POA: Diagnosis not present

## 2023-10-24 DIAGNOSIS — Z9981 Dependence on supplemental oxygen: Secondary | ICD-10-CM

## 2023-10-24 DIAGNOSIS — Z87891 Personal history of nicotine dependence: Secondary | ICD-10-CM

## 2023-10-24 DIAGNOSIS — G2581 Restless legs syndrome: Secondary | ICD-10-CM | POA: Insufficient documentation

## 2023-10-24 DIAGNOSIS — I5030 Unspecified diastolic (congestive) heart failure: Secondary | ICD-10-CM | POA: Diagnosis not present

## 2023-10-24 DIAGNOSIS — I721 Aneurysm of artery of upper extremity: Secondary | ICD-10-CM

## 2023-10-24 DIAGNOSIS — Z9889 Other specified postprocedural states: Secondary | ICD-10-CM

## 2023-10-24 DIAGNOSIS — Z7901 Long term (current) use of anticoagulants: Secondary | ICD-10-CM

## 2023-10-24 HISTORY — PX: PERIPHERAL VASCULAR INTERVENTION: CATH118257

## 2023-10-24 HISTORY — PX: UPPER EXTREMITY ANGIOGRAPHY: CATH118270

## 2023-10-24 LAB — GLUCOSE, CAPILLARY: Glucose-Capillary: 117 mg/dL — ABNORMAL HIGH (ref 70–99)

## 2023-10-24 LAB — HEMOGLOBIN AND HEMATOCRIT, BLOOD
HCT: 31.6 % — ABNORMAL LOW (ref 36.0–46.0)
Hemoglobin: 10.2 g/dL — ABNORMAL LOW (ref 12.0–15.0)

## 2023-10-24 SURGERY — UPPER EXTREMITY ANGIOGRAPHY
Anesthesia: LOCAL | Laterality: Right

## 2023-10-24 MED ORDER — ATORVASTATIN CALCIUM 40 MG PO TABS
40.0000 mg | ORAL_TABLET | Freq: Every day | ORAL | Status: DC
  Administered 2023-10-24 – 2023-10-28 (×5): 40 mg via ORAL
  Filled 2023-10-24 (×5): qty 1

## 2023-10-24 MED ORDER — FLUTICASONE FUROATE-VILANTEROL 200-25 MCG/ACT IN AEPB
1.0000 | INHALATION_SPRAY | Freq: Every day | RESPIRATORY_TRACT | Status: DC
  Administered 2023-10-26 – 2023-10-28 (×3): 1 via RESPIRATORY_TRACT
  Filled 2023-10-24: qty 28

## 2023-10-24 MED ORDER — ACETAMINOPHEN 325 MG PO TABS
650.0000 mg | ORAL_TABLET | ORAL | Status: DC | PRN
  Administered 2023-10-25 – 2023-10-26 (×3): 650 mg via ORAL
  Filled 2023-10-24 (×3): qty 2

## 2023-10-24 MED ORDER — MIDAZOLAM HCL 2 MG/2ML IJ SOLN
INTRAMUSCULAR | Status: DC | PRN
  Administered 2023-10-24 (×2): 1 mg via INTRAVENOUS

## 2023-10-24 MED ORDER — FLUOXETINE HCL 20 MG PO CAPS: 20.0000 mg | ORAL_CAPSULE | Freq: Every day | ORAL | Status: DC | PRN

## 2023-10-24 MED ORDER — DILTIAZEM HCL ER COATED BEADS 120 MG PO CP24: 120.0000 mg | ORAL_CAPSULE | Freq: Every morning | ORAL | Status: DC

## 2023-10-24 MED ORDER — VERAPAMIL HCL 2.5 MG/ML IV SOLN
INTRAVENOUS | Status: DC | PRN
  Administered 2023-10-24: 10 mL via INTRA_ARTERIAL

## 2023-10-24 MED ORDER — CLOPIDOGREL BISULFATE 75 MG PO TABS
75.0000 mg | ORAL_TABLET | Freq: Every day | ORAL | Status: DC
  Administered 2023-10-25 – 2023-10-28 (×4): 75 mg via ORAL
  Filled 2023-10-24 (×4): qty 1

## 2023-10-24 MED ORDER — SODIUM CHLORIDE 0.9% FLUSH
3.0000 mL | Freq: Two times a day (BID) | INTRAVENOUS | Status: DC
  Administered 2023-10-24 – 2023-10-28 (×6): 3 mL via INTRAVENOUS

## 2023-10-24 MED ORDER — FENTANYL CITRATE (PF) 100 MCG/2ML IJ SOLN
INTRAMUSCULAR | Status: AC
  Filled 2023-10-24: qty 2

## 2023-10-24 MED ORDER — CLOPIDOGREL BISULFATE 300 MG PO TABS
ORAL_TABLET | ORAL | Status: DC | PRN
  Administered 2023-10-24: 300 mg via ORAL

## 2023-10-24 MED ORDER — HEPARIN (PORCINE) IN NACL 1000-0.9 UT/500ML-% IV SOLN
INTRAVENOUS | Status: DC | PRN
  Administered 2023-10-24 (×2): 500 mL

## 2023-10-24 MED ORDER — HYDRALAZINE HCL 20 MG/ML IJ SOLN: 5.0000 mg | INTRAMUSCULAR | Status: DC | PRN

## 2023-10-24 MED ORDER — ONDANSETRON HCL 4 MG PO TABS: 4.0000 mg | ORAL_TABLET | Freq: Four times a day (QID) | ORAL | Status: DC | PRN

## 2023-10-24 MED ORDER — ENALAPRIL MALEATE 10 MG PO TABS
20.0000 mg | ORAL_TABLET | Freq: Two times a day (BID) | ORAL | Status: DC
  Administered 2023-10-24 – 2023-10-26 (×2): 20 mg via ORAL
  Filled 2023-10-24 (×7): qty 2

## 2023-10-24 MED ORDER — ONDANSETRON HCL 4 MG/2ML IJ SOLN: 4.0000 mg | Freq: Four times a day (QID) | INTRAMUSCULAR | Status: DC | PRN

## 2023-10-24 MED ORDER — LIDOCAINE HCL (PF) 1 % IJ SOLN
INTRAMUSCULAR | Status: AC
  Filled 2023-10-24: qty 30

## 2023-10-24 MED ORDER — FENTANYL CITRATE PF 50 MCG/ML IJ SOSY
50.0000 ug | PREFILLED_SYRINGE | Freq: Once | INTRAMUSCULAR | Status: AC
  Administered 2023-10-24: 50 ug via INTRAVENOUS
  Filled 2023-10-24: qty 1

## 2023-10-24 MED ORDER — SODIUM CHLORIDE 0.9 % IV SOLN: 250.0000 mL | INTRAVENOUS | Status: DC | PRN

## 2023-10-24 MED ORDER — HEPARIN SODIUM (PORCINE) 1000 UNIT/ML IJ SOLN
INTRAMUSCULAR | Status: AC
  Filled 2023-10-24: qty 10

## 2023-10-24 MED ORDER — SODIUM CHLORIDE 0.9% FLUSH: 3.0000 mL | INTRAVENOUS | Status: DC | PRN

## 2023-10-24 MED ORDER — ISOSORBIDE MONONITRATE ER 30 MG PO TB24
30.0000 mg | ORAL_TABLET | Freq: Every day | ORAL | Status: DC
  Administered 2023-10-24 – 2023-10-27 (×3): 30 mg via ORAL
  Filled 2023-10-24 (×3): qty 1

## 2023-10-24 MED ORDER — FUROSEMIDE 20 MG PO TABS
20.0000 mg | ORAL_TABLET | Freq: Two times a day (BID) | ORAL | Status: DC
  Administered 2023-10-24 – 2023-10-27 (×3): 20 mg via ORAL
  Filled 2023-10-24 (×3): qty 1

## 2023-10-24 MED ORDER — CLOPIDOGREL BISULFATE 75 MG PO TABS: 75.0000 mg | ORAL_TABLET | Freq: Every day | ORAL | Status: DC

## 2023-10-24 MED ORDER — IODIXANOL 320 MG/ML IV SOLN
INTRAVENOUS | Status: DC | PRN
  Administered 2023-10-24: 95 mL

## 2023-10-24 MED ORDER — MIDAZOLAM HCL 2 MG/2ML IJ SOLN
INTRAMUSCULAR | Status: AC
  Filled 2023-10-24: qty 2

## 2023-10-24 MED ORDER — VERAPAMIL HCL 2.5 MG/ML IV SOLN
INTRAVENOUS | Status: AC
  Filled 2023-10-24: qty 2

## 2023-10-24 MED ORDER — ASPIRIN 81 MG PO TBEC
81.0000 mg | DELAYED_RELEASE_TABLET | Freq: Every day | ORAL | Status: DC
  Administered 2023-10-24 – 2023-10-28 (×5): 81 mg via ORAL
  Filled 2023-10-24 (×5): qty 1

## 2023-10-24 MED ORDER — CLOPIDOGREL BISULFATE 300 MG PO TABS
ORAL_TABLET | ORAL | Status: AC
  Filled 2023-10-24: qty 1

## 2023-10-24 MED ORDER — LABETALOL HCL 5 MG/ML IV SOLN: 10.0000 mg | INTRAVENOUS | Status: DC | PRN

## 2023-10-24 MED ORDER — OXYCODONE HCL 5 MG PO TABS
5.0000 mg | ORAL_TABLET | Freq: Once | ORAL | Status: AC
  Administered 2023-10-24: 5 mg via ORAL
  Filled 2023-10-24: qty 1

## 2023-10-24 MED ORDER — SODIUM CHLORIDE 0.9 % WEIGHT BASED INFUSION: 1.0000 mL/kg/h | INTRAVENOUS | Status: AC

## 2023-10-24 MED ORDER — LEVOTHYROXINE SODIUM 75 MCG PO TABS
75.0000 ug | ORAL_TABLET | Freq: Every day | ORAL | Status: DC
  Administered 2023-10-25 – 2023-10-28 (×4): 75 ug via ORAL
  Filled 2023-10-24 (×4): qty 1

## 2023-10-24 MED ORDER — ALBUTEROL SULFATE (2.5 MG/3ML) 0.083% IN NEBU
2.5000 mg | INHALATION_SOLUTION | RESPIRATORY_TRACT | Status: DC | PRN
  Administered 2023-10-24 (×2): 2.5 mg via RESPIRATORY_TRACT
  Filled 2023-10-24 (×2): qty 3

## 2023-10-24 MED ORDER — ALPRAZOLAM 0.25 MG PO TABS
0.2500 mg | ORAL_TABLET | Freq: Every evening | ORAL | Status: DC | PRN
  Administered 2023-10-25 – 2023-10-28 (×2): 0.25 mg via ORAL
  Filled 2023-10-24 (×2): qty 1

## 2023-10-24 MED ORDER — SODIUM CHLORIDE 0.9 % IV SOLN: INTRAVENOUS | Status: AC

## 2023-10-24 MED ORDER — PROTAMINE SULFATE 10 MG/ML IV SOLN
INTRAVENOUS | Status: DC | PRN
  Administered 2023-10-24: 45 mg via INTRAVENOUS
  Administered 2023-10-24: 5 mg via INTRAVENOUS

## 2023-10-24 MED ORDER — PANTOPRAZOLE SODIUM 40 MG PO TBEC
40.0000 mg | DELAYED_RELEASE_TABLET | Freq: Two times a day (BID) | ORAL | Status: DC
  Administered 2023-10-25 – 2023-10-28 (×7): 40 mg via ORAL
  Filled 2023-10-24 (×8): qty 1

## 2023-10-24 MED ORDER — LIDOCAINE HCL (PF) 1 % IJ SOLN
INTRAMUSCULAR | Status: DC | PRN
  Administered 2023-10-24: 15 mL
  Administered 2023-10-24: 10 mL

## 2023-10-24 MED ORDER — CLOPIDOGREL BISULFATE 75 MG PO TABS
300.0000 mg | ORAL_TABLET | Freq: Once | ORAL | Status: DC
  Filled 2023-10-24: qty 4

## 2023-10-24 MED ORDER — HEPARIN SODIUM (PORCINE) 1000 UNIT/ML IJ SOLN
INTRAMUSCULAR | Status: DC | PRN
  Administered 2023-10-24: 8000 [IU] via INTRAVENOUS
  Administered 2023-10-24 (×2): 2000 [IU] via INTRAVENOUS

## 2023-10-24 MED ORDER — FENTANYL CITRATE (PF) 100 MCG/2ML IJ SOLN
INTRAMUSCULAR | Status: DC | PRN
  Administered 2023-10-24 (×3): 50 ug via INTRAVENOUS

## 2023-10-24 MED ORDER — MORPHINE SULFATE (PF) 2 MG/ML IV SOLN
1.0000 mg | INTRAVENOUS | Status: DC | PRN
  Administered 2023-10-24: 1 mg via INTRAVENOUS
  Filled 2023-10-24: qty 1

## 2023-10-24 SURGICAL SUPPLY — 29 items
BALLN STERLING OTW 6X40X135 (BALLOONS) ×1
BALLOON STERLING OTW 6X40X135 (BALLOONS) IMPLANT
CATH ANGIO 5F BER2 100CM (CATHETERS) IMPLANT
CATH ANGIO 5F PIGTAIL 100CM (CATHETERS) IMPLANT
CATH ANGIO 5F SIM1 100CM (CATHETERS) IMPLANT
CATH CXI 4F 150 ST (CATHETERS) IMPLANT
CATH HEADHUNTER H1 5F 100CM (CATHETERS) IMPLANT
CATH INFINITI JR4 5F (CATHETERS) IMPLANT
CLOSURE PERCLOSE PROSTYLE (VASCULAR PRODUCTS) IMPLANT
COVER DOME SNAP 22 D (MISCELLANEOUS) IMPLANT
DEVICE RAD COMP TR BAND LRG (VASCULAR PRODUCTS) IMPLANT
GLIDEWIRE ADV .035X260CM (WIRE) IMPLANT
GUIDEWIRE ANGLED .035X260CM (WIRE) IMPLANT
KIT ENCORE 26 ADVANTAGE (KITS) IMPLANT
KIT MICROPUNCTURE NIT STIFF (SHEATH) IMPLANT
KIT SYRINGE INJ CVI SPIKEX1 (MISCELLANEOUS) IMPLANT
SET ATX-X65L (MISCELLANEOUS) IMPLANT
SHEATH CATAPULT 7F 90 MP (SHEATH) IMPLANT
SHEATH CATAPULT 7FR 60 (SHEATH) IMPLANT
SHEATH GLIDE SLENDER 4/5FR (SHEATH) IMPLANT
SHEATH PINNACLE 5F 10CM (SHEATH) IMPLANT
SHEATH PROBE COVER 6X72 (BAG) IMPLANT
SNARE GOOSENECK 10MM (VASCULAR PRODUCTS) IMPLANT
STENT VIABAHN 5X120X77FR (Permanent Stent) ×1 IMPLANT
STENT VIABAHN 7X5X120 7FR (Permanent Stent) IMPLANT
TRAY PV CATH (CUSTOM PROCEDURE TRAY) ×1 IMPLANT
WIRE EMERALD 3MM-J .035X150CM (WIRE) IMPLANT
WIRE G V18X300CM (WIRE) IMPLANT
WIRE STARTER BENTSON 035X150 (WIRE) IMPLANT

## 2023-10-24 NOTE — ED Notes (Signed)
ED TO INPATIENT HANDOFF REPORT  ED Nurse Name and Phone #: Trinna Post RN, [phone]  S Name/Age/Gender Linda Brady 82 y.o. female Room/Bed: 017C/017C  Code Status   Code Status: Prior  Home/SNF/Other Home Patient oriented to: self, place, time, and situation Is this baseline? Yes   Triage Complete: Triage complete  Chief Complaint Right arm pain and numbness  Triage Note Seen today for heart cath. Afterwards in recovery pt alerted staff for RUE swelling, pain and numbness. Was assessed by Np/MD and had manual pressure applied.  Says they were planning for [place] but decided not to.   Instructed to hold eliquis until Monday.   After going home presented with worsening pain and right arm numbness.    Allergies Allergies  Allergen Reactions   Naproxen Sodium Anaphylaxis    Level of Care/Admitting Diagnosis ED Disposition     ED Disposition  Admit   Condition  --   Comment  The patient appears reasonably stabilized for admission considering the current resources, flow, and capabilities available in the ED at this time, and I doubt any other [facility] requiring further screening and/or treatment in the ED prior to admission is  present.          B Medical/Surgery History Past Medical History:  Diagnosis Date   COPD (chronic obstructive pulmonary disease) (HCC)    GERD (gastroesophageal reflux disease)    Hypertension    Hypothyroidism    Thyroid disease    Past Surgical History:  Procedure Laterality Date   BREAST CYST ASPIRATION Left 10 plus yrs   benign-twice   CATARACT EXTRACTION W/PHACO Left 03/20/2023   Procedure: CATARACT EXTRACTION PHACO AND INTRAOCULAR LENS PLACEMENT (IOC) LEFT VISION BLUE;  Surgeon: Estanislado Pandy, MD;  Location: [facility];  Service: Ophthalmology;  Laterality: Left;  21.02   01:38.2   LEFT HEART CATH AND CORONARY ANGIOGRAPHY N/A 09/11/2020   Procedure: LEFT HEART CATH AND CORONARY ANGIOGRAPHY;  Surgeon: Marcina Millard,  MD;  Location: [facility] INVASIVE CV LAB;  Service: Cardiovascular;  Laterality: N/A;     A IV Location/Drains/Wounds Patient Lines/Drains/Airways Status     Active Line/Drains/Airways     Name Placement date Placement time Site Days   Peripheral IV 10/23/23 20 G Anterior;Left Forearm 10/23/23  2155  Forearm  1            Intake/Output Last 24 hours No intake or output data in the 24 hours ending 10/24/23 0254  Labs/Imaging Results for orders placed or performed during the hospital encounter of 10/23/23 (from the past 48 hour(s))  CBC     Status: Abnormal   Collection Time: 10/23/23  9:52 PM  Result Value Ref Range   WBC 12.1 (H) 4.0 - 10.5 K/uL   RBC 3.43 (L) 3.87 - 5.11 MIL/uL   Hemoglobin 10.4 (L) 12.0 - 15.0 g/dL   HCT 96.0 (L) 45.4 - 09.8 %   MCV 92.7 80.0 - 100.0 fL   MCH 30.3 26.0 - 34.0 pg   MCHC 32.7 30.0 - 36.0 g/dL   RDW 11.9 14.7 - 82.9 %   Platelets 382 150 - 400 K/uL   nRBC 0.0 0.0 - 0.2 %    Comment: Performed at [facility], [address] [place], [place] [address]  Basic metabolic panel     Status: Abnormal   Collection Time: 10/23/23  9:52 PM  Result Value Ref Range   Sodium 130 (L) 135 - 145 mmol/L   Potassium 3.6 3.5 -  5.1 mmol/L   Chloride 97 (L) 98 - 111 mmol/L   CO2 24 22 - 32 mmol/L   Glucose, Bld 131 (H) 70 - 99 mg/dL    Comment: Glucose reference range applies only to samples taken after fasting for at least 8 hours.   BUN 9 8 - 23 mg/dL   Creatinine, Ser 2.44 0.44 - 1.00 mg/dL   Calcium 9.2 8.9 - 01.0 mg/dL   GFR, Estimated >27 >25 mL/min    Comment: (NOTE) Calculated using the CKD-EPI Creatinine Equation (2021)    Anion gap 9 5 - 15    Comment: Performed at [facility], [address], [place], [place] [address]   CT ANGIO UP EXTREM RIGHT W &/OR WO CONTRAST  Result Date: 10/23/2023 CLINICAL DATA:  History of recent catheterization via the right radial artery with increasing arm pain and numbness initial encounter EXAM:  CT ANGIOGRAPHY OF THE RIGHT UPPEREXTREMITY TECHNIQUE: Multidetector CT imaging of the right upper extremitywas performed using the standard protocol during bolus administration of intravenous contrast. Multiplanar CT image reconstructions and MIPs were obtained to evaluate the vascular anatomy. RADIATION DOSE REDUCTION: This exam was performed according to the departmental dose-optimization program which includes automated exposure control, adjustment of the mA and/or kV according to patient size and/or use of iterative reconstruction technique. CONTRAST:  90mL OMNIPAQUE IOHEXOL 350 MG/ML SOLN COMPARISON:  None Available. FINDINGS: Vascular: The thoracic aorta demonstrates atherosclerotic calcifications without aneurysmal dilatation or dissection. Coronary calcifications are seen. No significant cardiac enlargement is noted. No pulmonary emboli are seen. Right innominate artery demonstrates mild atherosclerotic calcifications. Normal bifurcation into the right common carotid artery and right subclavian artery is noted. Axillary artery on the right appears within normal limits. The origins of the subscapular artery and posterior circumflex humeral artery are well visualized and within normal limits. Just beyond this level in the proximal aspect of the brachial artery there is a focal area of dissection identified best visualized from images number 155 to 177 of series 6. A small branch vessel arises from the false lumen and supplies a somewhat bilobed collection of contrast measuring up to 18 mm in greatest transverse dimension. The bilobed nature of the pseudoaneurysm measures up to 3.3 cm in craniocaudad projection. These changes are likely related to a pseudoaneurysm arising from the branch vessel. The more distal brachial artery appears within normal limits. Bifurcation into the radial and ulnar arteries appear within normal limits. The radial and ulnar arteries are visualized patent to the level of right wrist.  Nonvascular: Visualized lung fields appear within normal limits with the exception of mild scarring in the right middle lobe. Visualized portions of the liver and right kidney show no acute abnormality. A hiatal hernia is noted of a moderate degree. Visualized abdominal and pelvic structures are within normal limits. No acute bony abnormality is noted. 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 Considerable subcutaneous edema is noted in the upper arm and axillary region. This may represent some mild prior hemorrhage as well. Review of the MIP images confirms the above findings. IMPRESSION: Focal area of dissection in the proximal aspect of the brachial artery just beyond the origin of the subscapular artery. Arising from the false lumen there is a small branch vessel identified with supply of a bilobed pseudoaneurysm in the medial aspect of the right upper arm/axilla. Some local compression on the adjacent nerves may contribute to the patient's peripheral numbness. Vascular surgery consultation is recommended. Patent  distal brachial artery as well as patent radial and ulnar arteries. No acute nonvascular abnormality is noted. Critical Value/emergent results were called by telephone at the time of interpretation on 10/23/2023 at 11:39 pm to [facility] MD, who verbally acknowledged these results. Electronically Signed   By: Alcide Clever M.D.   On: 10/23/2023 23:39   CARDIAC CATHETERIZATION  Result Date: 10/23/2023   Prox LAD lesion is 30% stenosed.   Ost RCA to Prox RCA lesion is 20% stenosed. 1.  Mild nonobstructive coronary artery disease. 2.  Fick cardiac output of 9.0 L/min and Fick cardiac index of 4.9 L/min/m with the following hemodynamics  Right atrial pressure mean of 7 mmHg  Right ventricular pressure 48/27 with end-diastolic pressure of 14 mmHg  PA pressure 48/17 with a mean of 29 mmHg  Wedge pressure mean of 18 mmHg V waves to 27 mmHg  PVR 1.2 Woods units  PA  pulsatility index of 4 Recommendation: Continue evaluation for aortic valve intervention.    Pending Labs Unresulted Labs (From admission, onward)    None       Vitals/Pain Today's Vitals   10/24/23 0006 10/24/23 0100 10/24/23 0200 10/24/23 0248  BP:  127/60 116/88   Pulse:  77 81   Resp:  18 20   Temp:      TempSrc:      SpO2:  96% 100%   PainSc: 2    10-Worst pain ever    Isolation Precautions No active isolations  Medications Medications  HYDROcodone-acetaminophen (NORCO/VICODIN) 5-325 MG per tablet 2 tablet (2 tablets Oral Given 10/23/23 2038)  iohexol (OMNIPAQUE) 350 MG/ML injection 90 mL (90 mLs Intravenous Contrast Given 10/23/23 2301)  fentaNYL (SUBLIMAZE) injection 50 mcg (50 mcg Intravenous Given 10/24/23 0143)    Mobility walks with person assist     Focused Assessments    R Recommendations: See Admitting Provider Note  Report given to:   Additional Notes:

## 2023-10-24 NOTE — Telephone Encounter (Signed)
Patient's daughter is requesting call back to discuss mother being in hospital again. She would also like to discuss upcoming appointment.

## 2023-10-24 NOTE — ED Notes (Signed)
Family updated as to patient's status.

## 2023-10-24 NOTE — Consult Note (Addendum)
Hospital Consult    Reason for Consult:  Pseudoaneurysm  MRN #:  [id number]  History of Present Illness: This is a 82 y.o. female with multiple significant medical comorbidities including COPD on home O2, diastolic heart failure, severe aortic stenosis with a valve area less than 1.0 and a a mean gradient of 49 with peaks of . She underwent diagnostic right and left heart cath yesterday from a right IJ and right radial approach.  Postoperatively she was reporting right upper extremity pain but there was no evidence of hematoma and manual pressure was held.  She was told to continue to hold her Eliquis until Monday and was discharged home.  She presented back to the emergency department overnight with right upper extremity pain and bruising.  CTA was obtained which demonstrated a upper arm/axilla brachial artery pseudoaneurysm.  She did initially have some numbness down the arm but reports this is improved and is motor sensor intact.  Past Medical History:  Diagnosis Date   COPD (chronic obstructive pulmonary disease) (HCC)    GERD (gastroesophageal reflux disease)    Hypertension    Hypothyroidism    Thyroid disease     Past Surgical History:  Procedure Laterality Date   BREAST CYST ASPIRATION Left 10 plus yrs   benign-twice   CATARACT EXTRACTION W/PHACO Left 03/20/2023   Procedure: CATARACT EXTRACTION PHACO AND INTRAOCULAR LENS PLACEMENT (IOC) LEFT VISION BLUE;  Surgeon: Estanislado Pandy, MD;  Location: [facility];  Service: Ophthalmology;  Laterality: Left;  21.02   01:38.2   LEFT HEART CATH AND CORONARY ANGIOGRAPHY N/A 09/11/2020   Procedure: LEFT HEART CATH AND CORONARY ANGIOGRAPHY;  Surgeon: Marcina Millard, MD;  Location: [facility] INVASIVE CV LAB;  Service: Cardiovascular;  Laterality: N/A;    Allergies  Allergen Reactions   Naproxen Sodium Anaphylaxis    Prior to Admission medications   Medication Sig Start Date End Date Taking? Authorizing Provider   ALPRAZolam Prudy Feeler) 0.25 MG tablet Take 0.25 mg by mouth at bedtime as needed for sleep.   Yes [provider]  atorvastatin (LIPITOR) 40 MG tablet Take 1 tablet (40 mg total) by mouth daily. 09/20/23  Yes Sunnie Nielsen, DO  budesonide-formoterol [facility]) 160-4.5 MCG/ACT inhaler Inhale 2 puffs into the lungs 2 (two) times daily. 09/18/23  Yes Mikey College T, MD  cholecalciferol (VITAMIN D3) 25 MCG (1000 UNIT) tablet Take 1,000 Units by mouth daily.   Yes [provider]  diltiazem (CARDIZEM CD) 120 MG 24 hr capsule Take 1 capsule (120 mg total) by mouth at bedtime. Start PM 09/19/23 Patient taking differently: Take 120 mg by mouth in the morning. Start PM 09/19/23 09/19/23 09/18/24 Yes Alexander, Dorene Grebe, DO  enalapril (VASOTEC) 20 MG tablet Take 1 tablet (20 mg total) by mouth 2 (two) times daily. 09/10/22  Yes Furth, Cadence H, PA-C  FLUoxetine (PROZAC) 20 MG capsule Take 20 mg by mouth daily as needed (anxiety). 03/14/20  Yes [provider]  hydrochlorothiazide (HYDRODIURIL) 25 MG tablet Take 25 mg by mouth daily.   Yes [provider]  Ipratropium-Albuterol (COMBIVENT RESPIMAT) 20-100 MCG/ACT AERS respimat Inhale 1 puff into the lungs every 6 (six) hours as needed for wheezing.   Yes [provider]  isosorbide mononitrate (IMDUR) 30 MG 24 hr tablet Take 1 tablet (30 mg total) by mouth daily. 10/14/22  Yes Furth, Cadence H, PA-C  levothyroxine (SYNTHROID) 75 MCG tablet Take 75 mcg by mouth daily. 05/02/20  Yes [provider]  pantoprazole (PROTONIX) 40 MG  tablet Take 1 tablet (40 mg total) by mouth 2 (two) times daily. 09/19/23  Yes Sunnie Nielsen, DO  vitamin B-12 (CYANOCOBALAMIN) 1000 MCG tablet Take 1,000 mcg by mouth daily.   Yes [provider]  apixaban (ELIQUIS) 5 MG TABS tablet Take 1 tablet (5 mg total) by mouth 2 (two) times daily. Start PM 09/19/23 09/19/23   Sunnie Nielsen, DO  furosemide (LASIX) 20 MG tablet  Take 1 tablet (20 mg total) by mouth 2 (two) times daily. Patient not taking: Reported on 10/24/2023 10/23/23 01/21/24  Orbie Pyo, MD  metoprolol tartrate (LOPRESSOR) 50 MG tablet Take one tablet by mouth 2 hours before CT scan Patient not taking: Reported on 10/24/2023 10/15/23   Orbie Pyo, MD    Social History   Socioeconomic History   Marital status: Widowed    Spouse name: Not on file   Number of children: Not on file   Years of education: Not on file   Highest education level: Not on file  Occupational History   Not on file  Tobacco Use   Smoking status: Former    Current packs/day: 0.50    Average packs/day: 0.5 packs/day for 12.0 years (6.0 ttl pk-yrs)    Types: Cigarettes   Smokeless tobacco: Never   Tobacco comments:    7-8 cigarettes a day  Vaping Use   Vaping status: Never Used  Substance and Sexual Activity   Alcohol use: Not Currently   Drug use: Not Currently   Sexual activity: Not Currently  Other Topics Concern   Not on file  Social History Narrative   Not on file   Social Determinants of Health   Financial Resource Strain: Low Risk  (09/30/2023)   Received from [facility]   Overall Financial Resource Strain (CARDIA)    Difficulty of Paying Living Expenses: Not hard at all  Food Insecurity: No Food Insecurity (09/30/2023)   Received from [facility]   Hunger Vital Sign    Worried About Running Out of Food in the Last Year: Never true    Ran Out of Food in the Last Year: Never true  Transportation Needs: No Transportation Needs (09/30/2023)   Received from [facility] - Transportation    In the past 12 months, has lack of transportation kept you from medical appointments or from getting medications?: No    Lack of Transportation (Non-Medical): No  Physical Activity: Not on file  Stress: Not on file  Social Connections: Not on file  Intimate Partner Violence: Not At Risk  (09/18/2023)   Humiliation, Afraid, Rape, and Kick questionnaire    Fear of Current or Ex-Partner: No    Emotionally Abused: No    Physically Abused: No    Sexually Abused: No    Family History  Problem Relation Age of Onset   Breast cancer Neg Hx     ROS: Otherwise negative unless mentioned in HPI  Physical Examination  Vitals:   10/24/23 0500 10/24/23 0646  BP: (!) 125/59   Pulse: 74   Resp: 18   Temp:  97.6 F (36.4 C)  SpO2: 98%    There is no height or weight on file to calculate BMI.  General: no acute distress Cardiac: hemodynamically stable, nontachycardic Pulm: on 3L with increased work of breathing Neuro: alert, no focal deficit Extremities: moderate ecchymosis from right axilla to mid upper arm, mild tenderness to palpation.  Sensation and handgrip intact. Vascular:  Right: palpable femoral  Left: palpable femoral   Data:   CTA with pseudoaneurysm of right brachial artery in the upper arm/axilla, there are a few side branches near the area of the injury.    ASSESSMENT/PLAN: This is a 82 y.o. female with severe medical comorbidities including COPD on home O2, diastolic heart failure and severe aortic stenosis who is presenting with a upper arm brachial artery pseudoaneurysm after heart catheterization performed yesterday.  I explained that this will need to be repaired and discussed the options of an open repair versus endovascular stent grafting.  I explained that given her medical comorbidities I am leaning towards stent grafting as I do not believe she would tolerate general anesthesia and given the location I do not believe a regional block would be adequate.  I will plan to discuss with my partners, Dr. Karin Lieu and Dr. Lenell Antu as they are the ones available today.   -Please keep n.p.o. -Final plans for repair will be determined this morning   Daria Pastures MD [facility] [phone] 10/24/2023  6:59 AM   Update: After  speaking with my partners we all agree that the best course of action for her would be angiogram with stent placement given her severe medical comorbidities. She will be added onto the Cath Lab schedule with my partner Dr. Lenell Antu Please keep n.p.o., consent ordered

## 2023-10-24 NOTE — H&P (Addendum)
History and Physical    Patient: Linda Brady:[id number] DOB: [birth year] DOA: 10/23/2023 DOS: the patient was seen and examined on 10/24/2023 PCP: [name], MD  Patient coming from: Home  Chief Complaint:  Chief Complaint  Patient presents with   Post-op Problem   HPI: Linda Brady is a 82 y.o. female with medical history significant of CAD, COPD, hypertension, dyslipidemia, GERD, hyponatremia, HFpEF, aortic stenosis, paroxysmal atrial fibrillation with RVR, hypothyroidism, restless leg syndrome, and anxiety/depression. Yesterday she had a diagnostic right and left heart cath with right IJ and right radial approach.  Post-procedure she experienced (R) arm pain, manual pressure held by nursing staff, and she was evaluated in PACU, at that time she did not have a hematoma on exam. She was instructed to hold Eliquis until Monday and discharged home. Last night she presented to [facility] Emergency Department with ongoing right upper extremity pain and bruising. She endorses some numbness of (R) arm yesterday that has improved and is now mild. Patient is able to move her right upper extremity, currently no motor or strength deficits.  ED Course: On arrival to [facility] ED patient was noted to be afebrile temp 36.9C, BP 148/75, HR 82, RR 20, SpO2 93% on room air. Labs notable for hyponatremia sodium 130 and hemoglobin 10.2.  CTA of right upper extremity obtained and shows focal area of dissection in the proximal brachial artery, there is also a pseudoaneurysm in the medial aspect of the right upper arm. Patient was given Norco, oxycodone, and fentanyl for pain control.  EDP consulted vascular surgery who will see patient in consultation. [facility] contacted for admission.   Review of Systems: As mentioned in the history of present illness. All other systems reviewed and are negative. Past Medical History:  Diagnosis Date   COPD (chronic obstructive pulmonary disease) (HCC)    GERD  (gastroesophageal reflux disease)    Hypertension    Hypothyroidism    Thyroid disease    Past Surgical History:  Procedure Laterality Date   BREAST CYST ASPIRATION Left 10 plus yrs   benign-twice   CATARACT EXTRACTION W/PHACO Left 03/20/2023   Procedure: CATARACT EXTRACTION PHACO AND INTRAOCULAR LENS PLACEMENT (IOC) LEFT VISION BLUE;  Surgeon: Estanislado Pandy, MD;  Location: [facility];  Service: Ophthalmology;  Laterality: Left;  21.02   01:38.2   LEFT HEART CATH AND CORONARY ANGIOGRAPHY N/A 09/11/2020   Procedure: LEFT HEART CATH AND CORONARY ANGIOGRAPHY;  Surgeon: Marcina Millard, MD;  Location: [facility] INVASIVE CV LAB;  Service: Cardiovascular;  Laterality: N/A;   RIGHT/LEFT HEART CATH AND CORONARY ANGIOGRAPHY N/A 10/23/2023   Procedure: RIGHT/LEFT HEART CATH AND CORONARY ANGIOGRAPHY;  Surgeon: Orbie Pyo, MD;  Location: [facility] INVASIVE CV LAB;  Service: Cardiovascular;  Laterality: N/A;   Social History:  reports that she has quit smoking. Her smoking use included cigarettes. She has a 6 pack-year smoking history. She has never used smokeless tobacco. She reports that she does not currently use alcohol. She reports that she does not currently use drugs.  Allergies  Allergen Reactions   Naproxen Sodium Anaphylaxis    Family History  Problem Relation Age of Onset   Breast cancer Neg Hx     Prior to Admission medications   Medication Sig Start Date End Date Taking? Authorizing Provider  ALPRAZolam Prudy Feeler) 0.25 MG tablet Take 0.25 mg by mouth at bedtime as needed for sleep.   Yes [provider]  atorvastatin (LIPITOR) 40 MG tablet Take 1 tablet (  40 mg total) by mouth daily. 09/20/23  Yes Sunnie Nielsen, DO  budesonide-formoterol [facility]) 160-4.5 MCG/ACT inhaler Inhale 2 puffs into the lungs 2 (two) times daily. 09/18/23  Yes Mikey College T, MD  cholecalciferol (VITAMIN D3) 25 MCG (1000 UNIT) tablet Take 1,000 Units by mouth daily.   Yes [provider]  diltiazem (CARDIZEM CD) 120 MG 24 hr capsule Take 1 capsule (120 mg total) by mouth at bedtime. Start PM 09/19/23 Patient taking differently: Take 120 mg by mouth in the morning. Start PM 09/19/23 09/19/23 09/18/24 Yes Alexander, Dorene Grebe, DO  enalapril (VASOTEC) 20 MG tablet Take 1 tablet (20 mg total) by mouth 2 (two) times daily. 09/10/22  Yes Furth, Cadence H, PA-C  FLUoxetine (PROZAC) 20 MG capsule Take 20 mg by mouth daily as needed (anxiety). 03/14/20  Yes [provider]  hydrochlorothiazide (HYDRODIURIL) 25 MG tablet Take 25 mg by mouth daily.   Yes [provider]  Ipratropium-Albuterol (COMBIVENT RESPIMAT) 20-100 MCG/ACT AERS respimat Inhale 1 puff into the lungs every 6 (six) hours as needed for wheezing.   Yes [provider]  isosorbide mononitrate (IMDUR) 30 MG 24 hr tablet Take 1 tablet (30 mg total) by mouth daily. 10/14/22  Yes Furth, Cadence H, PA-C  levothyroxine (SYNTHROID) 75 MCG tablet Take 75 mcg by mouth daily. 05/02/20  Yes [provider]  pantoprazole (PROTONIX) 40 MG tablet Take 1 tablet (40 mg total) by mouth 2 (two) times daily. 09/19/23  Yes Sunnie Nielsen, DO  vitamin B-12 (CYANOCOBALAMIN) 1000 MCG tablet Take 1,000 mcg by mouth daily.   Yes [provider]  apixaban (ELIQUIS) 5 MG TABS tablet Take 1 tablet (5 mg total) by mouth 2 (two) times daily. Start PM 09/19/23 09/19/23   Sunnie Nielsen, DO  furosemide (LASIX) 20 MG tablet Take 1 tablet (20 mg total) by mouth 2 (two) times daily. Patient not taking: Reported on 10/24/2023 10/23/23 01/21/24  Orbie Pyo, MD  metoprolol tartrate (LOPRESSOR) 50 MG tablet Take one tablet by mouth 2 hours before CT scan Patient not taking: Reported on 10/24/2023 10/15/23   Orbie Pyo, MD    Physical Exam: Vitals:   10/24/23 0330 10/24/23 0500 10/24/23 0646 10/24/23 0700  BP:  (!) 125/59    Pulse: 79 74  78  Resp: 20 18  20   Temp:   97.6 F (36.4 C)    TempSrc:   Oral   SpO2: 98% 98%  100%    Constitutional: NAD, calm Eyes: PERRL, lids and conjunctivae normal ENMT: Mucous membranes are moist. Posterior pharynx clear of any exudate or lesions.Normal dentition.  Neck: normal, supple, no masses, no thyromegaly. (R) internal jugular site covered by gauze and tegaderm: dressing clean, dry, intact. Respiratory: clear to auscultation bilaterally, no wheezing, no crackles. Normal respiratory effort. No accessory muscle use.  Cardiovascular: Regular rate and rhythm. Systolic ejection murmur noted. no rubs / gallops. +2 RLE edema/+1 LLE edema. 2+ radial and pedal pulses. No carotid bruits.  Abdomen: no tenderness, no masses palpated. No hepatosplenomegaly. Bowel sounds positive.  Musculoskeletal: no clubbing / cyanosis. No joint deformity upper and lower extremities. Good ROM, no contractures. Normal muscle tone.  Skin: no rashes, lesions, ulcers. No induration. Ecchymosis and swelling noted to medial aspect of (R) upper arm. Neurologic:  Alert and oriented x 3. Brady sensation decreased to light touch. Brady Strength 4/5. Normal mood.   Data Reviewed: CBC    Component Value Date/Time   WBC 12.1 (H) 10/23/2023 2152  RBC 3.43 (L) 10/23/2023 2152   HGB 10.2 (L) 10/24/2023 0312   HGB 11.2 10/01/2023 1533   HCT 31.6 (L) 10/24/2023 0312   HCT 34.4 10/01/2023 1533   PLT 382 10/23/2023 2152   PLT 392 10/01/2023 1533   MCV 92.7 10/23/2023 2152   MCV 94 10/01/2023 1533   MCV 99 09/01/2014 1631   MCH 30.3 10/23/2023 2152   MCHC 32.7 10/23/2023 2152   RDW 13.8 10/23/2023 2152   RDW 12.6 10/01/2023 1533   RDW 14.1 09/01/2014 1631   LYMPHSABS 1.4 09/18/2023 0402   LYMPHSABS 2.8 09/01/2014 1631   MONOABS 0.8 09/18/2023 0402   MONOABS 0.7 09/01/2014 1631   EOSABS 0.2 09/18/2023 0402   EOSABS 0.1 09/01/2014 1631   BASOSABS 0.1 09/18/2023 0402   BASOSABS 0.1 09/01/2014 1631      Latest Ref Rng & Units 10/23/2023    9:52 PM 10/23/2023   11:31 AM  10/23/2023   11:30 AM  BMP  Glucose 70 - 99 mg/dL 161     BUN 8 - 23 mg/dL 9     Creatinine 0.96 - 1.00 mg/dL 0.45     Sodium 409 - 811 mmol/L 130  139  137   Potassium 3.5 - 5.1 mmol/L 3.6  3.2  3.3   Chloride 98 - 111 mmol/L 97     CO2 22 - 32 mmol/L 24     Calcium 8.9 - 10.3 mg/dL 9.2      CT ANGIO UP EXTREM RIGHT W &/OR WO CONTRAST  Result Date: 10/23/2023 CLINICAL DATA:  History of recent catheterization via the right radial artery with increasing arm pain and numbness initial encounter EXAM: CT ANGIOGRAPHY OF THE RIGHT UPPEREXTREMITY TECHNIQUE: Multidetector CT imaging of the right upper extremitywas performed using the standard protocol during bolus administration of intravenous contrast. Multiplanar CT image reconstructions and MIPs were obtained to evaluate the vascular anatomy. RADIATION DOSE REDUCTION: This exam was performed according to the departmental dose-optimization program which includes automated exposure control, adjustment of the mA and/or kV according to patient size and/or use of iterative reconstruction technique. CONTRAST:  90mL OMNIPAQUE IOHEXOL 350 MG/ML SOLN COMPARISON:  None Available. FINDINGS: Vascular: The thoracic aorta demonstrates atherosclerotic calcifications without aneurysmal dilatation or dissection. Coronary calcifications are seen. No significant cardiac enlargement is noted. No pulmonary emboli are seen. Right innominate artery demonstrates mild atherosclerotic calcifications. Normal bifurcation into the right common carotid artery and right subclavian artery is noted. Axillary artery on the right appears within normal limits. The origins of the subscapular artery and posterior circumflex humeral artery are well visualized and within normal limits. Just beyond this level in the proximal aspect of the brachial artery there is a focal area of dissection identified best visualized from images number 155 to 177 of series 6. A small branch vessel arises from the  false lumen and supplies a somewhat bilobed collection of contrast measuring up to 18 mm in greatest transverse dimension. The bilobed nature of the pseudoaneurysm measures up to 3.3 cm in craniocaudad projection. These changes are likely related to a pseudoaneurysm arising from the branch vessel. The more distal brachial artery appears within normal limits. Bifurcation into the radial and ulnar arteries appear within normal limits. The radial and ulnar arteries are visualized patent to the level of right wrist. Nonvascular: Visualized lung fields appear within normal limits with the exception of mild scarring in the right middle lobe. Visualized portions of the liver and right kidney show no acute abnormality. A  hiatal hernia is noted of a moderate degree. Visualized abdominal and pelvic structures are within normal limits. No acute bony abnormality is noted. 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 Considerable subcutaneous edema is noted in the upper arm and axillary region. This may represent some mild prior hemorrhage as well. Review of the MIP images confirms the above findings. IMPRESSION: Focal area of dissection in the proximal aspect of the brachial artery just beyond the origin of the subscapular artery. Arising from the false lumen there is a small branch vessel identified with supply of a bilobed pseudoaneurysm in the medial aspect of the right upper arm/axilla. Some local compression on the adjacent nerves may contribute to the patient's peripheral numbness. Vascular surgery consultation is recommended. Patent distal brachial artery as well as patent radial and ulnar arteries. No acute nonvascular abnormality is noted. Critical Value/emergent results were called by telephone at the time of interpretation on 10/23/2023 at 11:39 pm to [facility] MD, who verbally acknowledged these results. Electronically Signed   By: Alcide Clever M.D.   On: 10/23/2023 23:39   CARDIAC  CATHETERIZATION  Result Date: 10/23/2023   Prox LAD lesion is 30% stenosed.   Ost RCA to Prox RCA lesion is 20% stenosed. 1.  Mild nonobstructive coronary artery disease. 2.  Fick cardiac output of 9.0 L/min and Fick cardiac index of 4.9 L/min/m with the following hemodynamics  Right atrial pressure mean of 7 mmHg  Right ventricular pressure 48/27 with end-diastolic pressure of 14 mmHg  PA pressure 48/17 with a mean of 29 mmHg  Wedge pressure mean of 18 mmHg V waves to 27 mmHg  PVR 1.2 Woods units  PA pulsatility index of 4 Recommendation: Continue evaluation for aortic valve intervention.     Assessment and Plan: #(R) Brachial Artery Dissection CTA (R) Arm with focal area of dissection in the proximal brachial artery, there is also a pseudoaneurysm in the medial aspect of the right upper arm. - Hold home Eliquis - Keep n.p.o. pending vascular surgery evaluation - Nursing neurovascular checks - Vascular surgery consulted appreciate their recommendations and management, current plan for angiogram with stent placement later today.  #Atrial Fibrillation - Continue home diltiazem - Hold Eliquis in setting of arterial dissection  #HFpEF #Severe Aortic Stenosis LVEF 65 to 70% in October 2024. - Hold home hydrochlorothiazide in setting of hyponatremia - Continue home Lasix   #COPD - Continue home Symbicort - PRN Albuterol nebulizer - Continue home supplemental O2 PRN  #Hyponatremia - Hold home Hydrochlorothiazide  #Hypertension - Continue home Vasotec, Imdur,  - Hold home hydrochlorothiazide  #CAD #Hyperlipidemia -Continue home atorvastatin  #Hypothyroidism -Continue home levothyroxine  #Restless Leg Syndrome - Requip  #GERD - Protonix  #Generalized Anxiety Disorder - Continue home Prozac - Continue PRN qHS Xanax   VTE prophylaxis: SCDs GI prophylaxis: Protonix, Hx of GERD Diet: NPO Access: PIV Lines: NONE Code Status: FULL Telemetry: Yes Disposition: Observe in  Progressive  Advance Care Planning:   Code Status: Full Code   Consults: Vascular Surgery called by EDP  Family Communication: Daughter Joann updated via phone  Severity of Illness: The appropriate patient status for this patient is OBSERVATION. Observation status is judged to be reasonable and necessary in order to provide the required intensity of service to ensure the patient's safety. The patient's presenting symptoms, physical exam findings, and initial radiographic and laboratory data in the context of their medical condition is felt to place them at decreased risk for further  clinical deterioration. Furthermore, it is anticipated that the patient will be medically stable for discharge from the hospital within 2 midnights of admission.   To reach the provider On-Call:   7AM- 7PM see care teams to locate the attending and reach out to them via [email or web address] Password: [email or web address] 7PM-7AM contact night-coverage If you still have difficulty reaching the appropriate provider, please page the [facility] (Director on Call) for [facility] on amion for assistance  This document was prepared using Conservation officer, historic buildings and may include unintentional dictation errors.  Bishop Limbo FNP-BC, PMHNP-BC Nurse Practitioner [facility] [place]  Attestation: Total of 75 minutes spent seeing evaluating and admitting this patient The patient was seen and examined independently, all medical records were reviewed, all labs were reviewed, plan of care were discussed with NP in detail..  Orders were were reviewed.  Consultation was placed     SIGNED: Kendell Bane, MD, FHM. FAAFP [facility],  Pager (please use [email or web address] to page/text)  Please use Epic Secure Chat for non-urgent communication (7AM-7PM) If 7PM-7AM, please contact night-coverage [email or web address],  10/24/2023, 12:06 PM

## 2023-10-24 NOTE — Telephone Encounter (Signed)
Spoke with daughter per [facility] and she was trying to see if patient still needed to get CTA on  Monday.  Did advise to keep appointment being that its part of her TAVR work up.   Patient is currently still in the hospital. Possible discharge tomorrow.

## 2023-10-24 NOTE — ED Notes (Signed)
Vasc US not available until 7am, MSE provider to order CTA instead

## 2023-10-24 NOTE — Plan of Care (Signed)
Problem: Education: Goal: Understanding of CV disease, CV risk reduction, and recovery process will improve Outcome: Progressing   Problem: Cardiovascular: Goal: Ability to achieve and maintain adequate cardiovascular perfusion will improve Outcome: Progressing Goal: Vascular access site(s) Level 0-1 will be maintained Outcome: Progressing

## 2023-10-24 NOTE — ED Notes (Signed)
Please update daughter 34 [phone]

## 2023-10-24 NOTE — Op Note (Signed)
DATE OF SERVICE: 10/24/2023  PATIENT:  Linda Brady  82 y.o. female  PRE-OPERATIVE DIAGNOSIS:  right axillary artery pseudoaneurysm  POST-OPERATIVE DIAGNOSIS:  Same  PROCEDURE:   1) Ultrasound guided right common femoral artery access 2) Arch aortogram 3) Innominate angiogram  4) Right upper extremity angiogram 5) Ultrasound guided right radial artery access 6) Endovascular repair of right axillary artery pseudoaneurysm (7x56mm Viabahn) 7) Conscious sedation (99 minutes)  SURGEON:  Rande Brunt. Lenell Antu, MD  ASSISTANT: none  ANESTHESIA:   local and IV sedation  ESTIMATED BLOOD LOSS: minimal  LOCAL MEDICATIONS USED:  LIDOCAINE   COUNTS: confirmed correct.  PATIENT DISPOSITION:  PACU - hemodynamically stable.   Delay start of Pharmacological VTE agent (>24hrs) due to surgical blood loss or risk of bleeding: no  INDICATION FOR PROCEDURE: Linda Brady is a 82 y.o. 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 After careful discussion of risks, benefits, and alternatives the patient was offered endovascular repair. The patient understood and wished to proceed.  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 The patient was positioned supine on the operating room table. Anesthesia was induced. The groins was prepped and draped in standard fashion. A surgical pause was performed confirming correct patient, procedure, and operative location.  The right groin was anesthetized with subcutaneous injection of 1% lidocaine. Using ultrasound guidance, the right common femoral artery was accessed with micropuncture technique.  Fluoroscopy was used to confirm cannulation over the femoral head. The 88F micropuncture sheath was upsized to 48F.   The patient was systemically heparinized.  A Glidewire advantage was navigated into the ascending aorta.  A pigtail catheter was advanced into the descending aorta.  Arch angiogram was performed.  Multiple catheter and wire exchanges were used to try to cannulate the innominate artery.  This was very challenging given the highly angulated nature of the aortic arch.  We changed our projection into an extreme left anterior oblique and caudal projection or able to eliminate some of the parallax in her arch.  I was able to select the innominate artery, but I was not able to deliver a wire retrograde into the axillary artery without it "kicking out".  The right radial artery was accessed with micropuncture technique.  A 4/5 French slender sheath was introduced into the radial artery after singlewall access.  A J-wire was navigated across the area of pseudoaneurysm atraumatically under fluoroscopic guidance.  The wire was advanced into the ascending aorta.  Over the wire a catheter was advanced into the ascending aorta.  A loop snare was advanced into the ascending aorta.  Using a combination of a barrier to catheter and Glidewire I was able to engage the loop snare from the femoral access and externalized the wire from the radial access.  With this system built, I was able to deliver a 7 [place] by 90 cm sheath into the right upper extremity subclavian artery.  Selective angiography was performed of the right upper extremity.  The pseudoaneurysm identified and marked on the screen.  Her wire was exchanged for a V18 wire.  A 7 x 50 mm Viabahn stent was positioned across the pseudoaneurysm to avoid covering the profunda arteries.  The stent was deployed in standard  fashion with good technical result.  The stent was placed angioplastied.  Per follow-up angiogram showed resolution of the pseudoaneurysm.   Satisfied we ended the case here.  All endovascular equipment was removed.  I attempted to use a Perclose device to close the groin.  We lost wire access and manual pressure was held.  Conscious sedation was administered with the use of IV fentanyl and midazolam under continuous physician and nurse monitoring.  Heart rate, blood pressure, and oxygen saturation were continuously monitored.  Total sedation time was 99 minutes  Upon completion of the case instrument and sharps counts were confirmed correct. The patient was transferred to the PACU in good condition. I was present for all portions of the procedure.  PLAN: ASA 81mg  PO every day. Plavix 75mg  PO every day. High intensity statin therapy.   Rande Brunt. Lenell Antu, MD [facility] Phone Number: [phone] 10/24/2023 5:07 PM

## 2023-10-24 NOTE — ED Notes (Signed)
MD-Rees notified that the patient has noticed increased bruising to her R bicep. RN evaluated and noted the bruising. New pain meds ordered at this time.

## 2023-10-24 NOTE — Progress Notes (Signed)
Patient right side slightly swollen towards medial, somewhat firm. No hematoma at actual site.    Will continue to monitor

## 2023-10-24 NOTE — ED Notes (Signed)
Vascular at bedside

## 2023-10-24 NOTE — ED Notes (Signed)
Pt ambulated to the restroom with moderate assistance. Patient reports Sob with exertion.

## 2023-10-24 NOTE — ED Notes (Addendum)
ED TO INPATIENT HANDOFF REPORT  ED Nurse Name and Phone #: [phone]  S Name/Age/Gender Linda Brady 82 y.o. female Room/Bed: 012C/012C  Code Status   Code Status: Full Code  Home/SNF/Other Home Patient oriented to: self, place, time, and situation Is this baseline? 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 Afterwards in recovery pt alerted staff for RUE swelling, pain and numbness. Was assessed by Np/MD and had manual pressure applied.  Says they were planning for [place] but decided not to.   Instructed to hold eliquis until Monday.   After going home presented with worsening pain and right arm numbness.    Allergies Allergies  Allergen Reactions   Naproxen Sodium Anaphylaxis    Level of Care/Admitting Diagnosis ED Disposition     ED Disposition  Admit   Condition  --   Comment  Hospital Area: [facility] [[id number]]  Level of Care: Progressive [102]  Admit to Progressive based on following criteria: CARDIOVASCULAR & THORACIC of moderate stability with acute coronary syndrome symptoms/low risk myocardial infarction/hypertensive urgency/arrhythmias/heart failure potentially compromising stability and stable post cardiovascular intervention patients.  Admit to Progressive based on following criteria: NEUROLOGICAL AND NEUROSURGICAL complex patients with significant risk of instability, who do not meet ICU criteria, yet require close observation or frequent assessment (< / = every 2 - 4 hours) with medical / nursing intervention.  Admit to Progressive based on following criteria: Other see comments  Comments: Frequent neurovascular checks  May place patient in observation at [facility] or [facility] if equivalent level of care is available:: No  Covid Evaluation: Asymptomatic - no recent exposure (last 10 days) testing not required  Diagnosis:  Dissection of right brachial artery [facility]) [[id number]]  Admitting Physician: Felipa Furnace  Attending Physician: Felipa Furnace          B Medical/Surgery History Past Medical History:  Diagnosis Date   COPD (chronic obstructive pulmonary disease) (HCC)    GERD (gastroesophageal reflux disease)    Hypertension    Hypothyroidism    Thyroid disease    Past Surgical History:  Procedure Laterality Date   BREAST CYST ASPIRATION Left 10 plus yrs   benign-twice   CATARACT EXTRACTION W/PHACO Left 03/20/2023   Procedure: CATARACT EXTRACTION PHACO AND INTRAOCULAR LENS PLACEMENT (IOC) LEFT VISION BLUE;  Surgeon: Estanislado Pandy, MD;  Location: [facility];  Service: Ophthalmology;  Laterality: Left;  21.02   01:38.2   LEFT HEART CATH AND CORONARY ANGIOGRAPHY N/A 09/11/2020   Procedure: LEFT HEART CATH AND CORONARY ANGIOGRAPHY;  Surgeon: Marcina Millard, MD;  Location: [facility] INVASIVE CV LAB;  Service: Cardiovascular;  Laterality: N/A;   RIGHT/LEFT HEART CATH AND CORONARY ANGIOGRAPHY N/A 10/23/2023   Procedure: RIGHT/LEFT HEART CATH AND CORONARY ANGIOGRAPHY;  Surgeon: Orbie Pyo, MD;  Location: [facility] INVASIVE CV LAB;  Service: Cardiovascular;  Laterality: N/A;     A IV Location/Drains/Wounds Patient Lines/Drains/Airways Status     Active Line/Drains/Airways     Name Placement date Placement time Site Days   Peripheral IV 10/23/23 20 G Anterior;Left Forearm 10/23/23  2155  Forearm  1            Intake/Output Last 24 hours  Intake/Output Summary (Last 24 hours) at 10/24/2023 1419 Last data filed at 10/24/2023 1107 Gross per 24 hour  Intake 3 ml  Output --  Net 3 ml  Labs/Imaging Results for orders placed or performed during the hospital encounter of 10/23/23 (from the past 48 hour(s))  CBC     Status: Abnormal   Collection Time: 10/23/23  9:52 PM  Result Value Ref Range   WBC 12.1 (H) 4.0 - 10.5 K/uL   RBC 3.43 (L) 3.87 -  5.11 MIL/uL   Hemoglobin 10.4 (L) 12.0 - 15.0 g/dL   HCT 16.1 (L) 09.6 - 04.5 %   MCV 92.7 80.0 - 100.0 fL   MCH 30.3 26.0 - 34.0 pg   MCHC 32.7 30.0 - 36.0 g/dL   RDW 40.9 81.1 - 91.4 %   Platelets 382 150 - 400 K/uL   nRBC 0.0 0.0 - 0.2 %    Comment: Performed at [facility], [address], [place], [place] [address]  Basic metabolic panel     Status: Abnormal   Collection Time: 10/23/23  9:52 PM  Result Value Ref Range   Sodium 130 (L) 135 - 145 mmol/L   Potassium 3.6 3.5 - 5.1 mmol/L   Chloride 97 (L) 98 - 111 mmol/L   CO2 24 22 - 32 mmol/L   Glucose, Bld 131 (H) 70 - 99 mg/dL    Comment: Glucose reference range applies only to samples taken after fasting for at least 8 hours.   BUN 9 8 - 23 mg/dL   Creatinine, Ser 6.21 0.44 - 1.00 mg/dL   Calcium 9.2 8.9 - 30.8 mg/dL   GFR, Estimated >65 >78 mL/min    Comment: (NOTE) Calculated using the CKD-EPI Creatinine Equation (2021)    Anion gap 9 5 - 15    Comment: Performed at [facility], [address], [place], [place] [address]  Hemoglobin and hematocrit, blood     Status: Abnormal   Collection Time: 10/24/23  3:12 AM  Result Value Ref Range   Hemoglobin 10.2 (L) 12.0 - 15.0 g/dL   HCT 95.2 (L) 84.1 - 32.4 %    Comment: Performed at [facility], [address] [place], [place] [address]   CT ANGIO UP EXTREM RIGHT W &/OR WO CONTRAST  Result Date: 10/23/2023 CLINICAL DATA:  History of recent catheterization via the right radial artery with increasing arm pain and numbness initial encounter EXAM: CT ANGIOGRAPHY OF THE RIGHT UPPEREXTREMITY TECHNIQUE: Multidetector CT imaging of the right upper extremitywas performed using the standard protocol during bolus administration of intravenous contrast. Multiplanar CT image reconstructions and MIPs were obtained to evaluate the vascular anatomy. RADIATION DOSE REDUCTION: This exam was performed according to the departmental dose-optimization program which includes  automated exposure control, adjustment of the mA and/or kV according to patient size and/or use of iterative reconstruction technique. CONTRAST:  90mL OMNIPAQUE IOHEXOL 350 MG/ML SOLN COMPARISON:  None Available. FINDINGS: Vascular: The thoracic aorta demonstrates atherosclerotic calcifications without aneurysmal dilatation or dissection. Coronary calcifications are seen. No significant cardiac enlargement is noted. No pulmonary emboli are seen. Right innominate artery demonstrates mild atherosclerotic calcifications. Normal bifurcation into the right common carotid artery and right subclavian artery is noted. Axillary artery on the right appears within normal limits. The origins of the subscapular artery and posterior circumflex humeral artery are well visualized and within normal limits. Just beyond this level in the proximal aspect of the brachial artery there is a focal area of dissection identified best visualized from images number 155 to 177 of series 6. A small branch vessel arises from the false lumen and supplies a somewhat bilobed collection  of contrast measuring up to 18 mm in greatest transverse dimension. The bilobed nature of the pseudoaneurysm measures up to 3.3 cm in craniocaudad projection. These changes are likely related to a pseudoaneurysm arising from the branch vessel. The more distal brachial artery appears within normal limits. Bifurcation into the radial and ulnar arteries appear within normal limits. The radial and ulnar arteries are visualized patent to the level of right wrist. Nonvascular: Visualized lung fields appear within normal limits with the exception of mild scarring in the right middle lobe. Visualized portions of the liver and right kidney show no acute abnormality. A hiatal hernia is noted of a moderate degree. Visualized abdominal and pelvic structures are within normal limits. No acute bony abnormality is noted. 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 Considerable subcutaneous edema is noted in the upper arm and axillary region. This may represent some mild prior hemorrhage as well. Review of the MIP images confirms the above findings. IMPRESSION: Focal area of dissection in the proximal aspect of the brachial artery just beyond the origin of the subscapular artery. Arising from the false lumen there is a small branch vessel identified with supply of a bilobed pseudoaneurysm in the medial aspect of the right upper arm/axilla. Some local compression on the adjacent nerves may contribute to the patient's peripheral numbness. Vascular surgery consultation is recommended. Patent distal brachial artery as well as patent radial and ulnar arteries. No acute nonvascular abnormality is noted. Critical Value/emergent results were called by telephone at the time of interpretation on 10/23/2023 at 11:39 pm to [facility] MD, who verbally acknowledged these results. Electronically Signed   By: Alcide Clever M.D.   On: 10/23/2023 23:39   CARDIAC CATHETERIZATION  Result Date: 10/23/2023   Prox LAD lesion is 30% stenosed.   Ost RCA to Prox RCA lesion is 20% stenosed. 1.  Mild nonobstructive coronary artery disease. 2.  Fick cardiac output of 9.0 L/min and Fick cardiac index of 4.9 L/min/m with the following hemodynamics  Right atrial pressure mean of 7 mmHg  Right ventricular pressure 48/27 with end-diastolic pressure of 14 mmHg  PA pressure 48/17 with a mean of 29 mmHg  Wedge pressure mean of 18 mmHg V waves to 27 mmHg  PVR 1.2 Woods units  PA pulsatility index of 4 Recommendation: Continue evaluation for aortic valve intervention.    Pending Labs Unresulted Labs (From admission, onward)     Start     Ordered   10/25/23 0500  Basic metabolic panel  Tomorrow morning,   R        10/24/23 0813   10/25/23 0500  CBC  Tomorrow morning,   R        10/24/23 0813   10/25/23 0500  Magnesium  Tomorrow morning,   R        10/24/23 1103             Vitals/Pain Today's Vitals   10/24/23 1120 10/24/23 1200 10/24/23 1300 10/24/23 1400  BP:  138/63 (!) 144/65 127/64  Pulse:  82 89 86  Resp:  19 19 20   Temp:      TempSrc:      SpO2:  100% 99% 100%  PainSc: 0-No pain       Isolation Precautions No active isolations  Medications Medications  albuterol (PROVENTIL) (2.5 MG/3ML) 0.083% nebulizer solution 2.5 mg (2.5 mg Nebulization Given 10/24/23 1307)  sodium chloride flush (NS) 0.9 % injection 3 mL (3 mLs Intravenous  Given 10/24/23 1107)  sodium chloride flush (NS) 0.9 % injection 3 mL (has no administration in time range)  ondansetron (ZOFRAN) tablet 4 mg (has no administration in time range)    Or  ondansetron (ZOFRAN) injection 4 mg (has no administration in time range)  morphine (PF) 2 MG/ML injection 1 mg (1 mg Intravenous Given 10/24/23 0905)  0.9 %  sodium chloride infusion ( Intravenous New Bag/Given 10/24/23 1147)  HYDROcodone-acetaminophen (NORCO/VICODIN) 5-325 MG per tablet 2 tablet (2 tablets Oral Given 10/23/23 2038)  iohexol (OMNIPAQUE) 350 MG/ML injection 90 mL (90 mLs Intravenous Contrast Given 10/23/23 2301)  fentaNYL (SUBLIMAZE) injection 50 mcg (50 mcg Intravenous Given 10/24/23 0143)  oxyCODONE (Oxy IR/ROXICODONE) immediate release tablet 5 mg (5 mg Oral Given 10/24/23 0300)    Mobility walks with device     Focused Assessments Cardiac Assessment Handoff:  Cardiac Rhythm: Normal sinus rhythm Lab Results  Component Value Date   TROPONINI < 0.02 09/01/2014   Lab Results  Component Value Date   DDIMER 0.54 (H) 06/10/2023   Does the Patient currently have chest pain? No   , Pulmonary Assessment Handoff:  Lung sounds: Bilateral Breath Sounds: Expiratory wheezes O2 Device: Nasal Cannula O2 Flow Rate (L/min): 2 L/min    R Recommendations: See Admitting Provider Note  Report given to:   Additional Notes:

## 2023-10-25 ENCOUNTER — Inpatient Hospital Stay (HOSPITAL_COMMUNITY): Payer: Medicare Other

## 2023-10-25 DIAGNOSIS — Z9981 Dependence on supplemental oxygen: Secondary | ICD-10-CM | POA: Diagnosis not present

## 2023-10-25 DIAGNOSIS — I350 Nonrheumatic aortic (valve) stenosis: Secondary | ICD-10-CM | POA: Diagnosis present

## 2023-10-25 DIAGNOSIS — F411 Generalized anxiety disorder: Secondary | ICD-10-CM | POA: Diagnosis present

## 2023-10-25 DIAGNOSIS — I5032 Chronic diastolic (congestive) heart failure: Secondary | ICD-10-CM | POA: Diagnosis present

## 2023-10-25 DIAGNOSIS — I4891 Unspecified atrial fibrillation: Secondary | ICD-10-CM

## 2023-10-25 DIAGNOSIS — Z7902 Long term (current) use of antithrombotics/antiplatelets: Secondary | ICD-10-CM | POA: Diagnosis not present

## 2023-10-25 DIAGNOSIS — F17210 Nicotine dependence, cigarettes, uncomplicated: Secondary | ICD-10-CM | POA: Diagnosis present

## 2023-10-25 DIAGNOSIS — I130 Hypertensive heart and chronic kidney disease with heart failure and stage 1 through stage 4 chronic kidney disease, or unspecified chronic kidney disease: Secondary | ICD-10-CM | POA: Diagnosis present

## 2023-10-25 DIAGNOSIS — D6869 Other thrombophilia: Secondary | ICD-10-CM | POA: Diagnosis present

## 2023-10-25 DIAGNOSIS — I721 Aneurysm of artery of upper extremity: Secondary | ICD-10-CM | POA: Diagnosis present

## 2023-10-25 DIAGNOSIS — I480 Paroxysmal atrial fibrillation: Secondary | ICD-10-CM | POA: Diagnosis present

## 2023-10-25 DIAGNOSIS — E785 Hyperlipidemia, unspecified: Secondary | ICD-10-CM | POA: Diagnosis present

## 2023-10-25 DIAGNOSIS — E876 Hypokalemia: Secondary | ICD-10-CM | POA: Diagnosis not present

## 2023-10-25 DIAGNOSIS — N1831 Chronic kidney disease, stage 3a: Secondary | ICD-10-CM | POA: Diagnosis present

## 2023-10-25 DIAGNOSIS — I2489 Other forms of acute ischemic heart disease: Secondary | ICD-10-CM | POA: Diagnosis present

## 2023-10-25 DIAGNOSIS — E039 Hypothyroidism, unspecified: Secondary | ICD-10-CM | POA: Diagnosis present

## 2023-10-25 DIAGNOSIS — Z79899 Other long term (current) drug therapy: Secondary | ICD-10-CM | POA: Diagnosis not present

## 2023-10-25 DIAGNOSIS — I7776 Dissection of artery of upper extremity: Secondary | ICD-10-CM | POA: Diagnosis present

## 2023-10-25 DIAGNOSIS — F32A Depression, unspecified: Secondary | ICD-10-CM | POA: Diagnosis present

## 2023-10-25 DIAGNOSIS — I2510 Atherosclerotic heart disease of native coronary artery without angina pectoris: Secondary | ICD-10-CM | POA: Diagnosis not present

## 2023-10-25 DIAGNOSIS — J449 Chronic obstructive pulmonary disease, unspecified: Secondary | ICD-10-CM | POA: Diagnosis present

## 2023-10-25 DIAGNOSIS — G2581 Restless legs syndrome: Secondary | ICD-10-CM | POA: Diagnosis present

## 2023-10-25 DIAGNOSIS — Z79890 Hormone replacement therapy (postmenopausal): Secondary | ICD-10-CM | POA: Diagnosis not present

## 2023-10-25 DIAGNOSIS — E871 Hypo-osmolality and hyponatremia: Secondary | ICD-10-CM | POA: Diagnosis present

## 2023-10-25 DIAGNOSIS — G8929 Other chronic pain: Secondary | ICD-10-CM | POA: Diagnosis present

## 2023-10-25 LAB — BASIC METABOLIC PANEL
Anion gap: 11 (ref 5–15)
BUN: 11 mg/dL (ref 8–23)
CO2: 24 mmol/L (ref 22–32)
Calcium: 8.8 mg/dL — ABNORMAL LOW (ref 8.9–10.3)
Chloride: 96 mmol/L — ABNORMAL LOW (ref 98–111)
Creatinine, Ser: 1.11 mg/dL — ABNORMAL HIGH (ref 0.44–1.00)
GFR, Estimated: 50 mL/min — ABNORMAL LOW (ref >60)
Glucose, Bld: 122 mg/dL — ABNORMAL HIGH (ref 70–99)
Potassium: 3.4 mmol/L — ABNORMAL LOW (ref 3.5–5.1)
Sodium: 131 mmol/L — ABNORMAL LOW (ref 135–145)

## 2023-10-25 LAB — CBC
HCT: 27.4 % — ABNORMAL LOW (ref 36.0–46.0)
Hemoglobin: 8.8 g/dL — ABNORMAL LOW (ref 12.0–15.0)
MCH: 29.7 pg (ref 26.0–34.0)
MCHC: 32.1 g/dL (ref 30.0–36.0)
MCV: 92.6 fL (ref 80.0–100.0)
Platelets: 311 10*3/uL (ref 150–400)
RBC: 2.96 MIL/uL — ABNORMAL LOW (ref 3.87–5.11)
RDW: 13.6 % (ref 11.5–15.5)
WBC: 12.2 10*3/uL — ABNORMAL HIGH (ref 4.0–10.5)
nRBC: 0 % (ref 0.0–0.2)

## 2023-10-25 LAB — GLUCOSE, CAPILLARY: Glucose-Capillary: 107 mg/dL — ABNORMAL HIGH (ref 70–99)

## 2023-10-25 LAB — MAGNESIUM: Magnesium: 1.6 mg/dL — ABNORMAL LOW (ref 1.7–2.4)

## 2023-10-25 LAB — TROPONIN I (HIGH SENSITIVITY)
Troponin I (High Sensitivity): 68 ng/L — ABNORMAL HIGH (ref <18)
Troponin I (High Sensitivity): 537 ng/L (ref <18)

## 2023-10-25 MED ORDER — AMIODARONE HCL 200 MG PO TABS
200.0000 mg | ORAL_TABLET | Freq: Two times a day (BID) | ORAL | Status: DC
  Administered 2023-10-25 – 2023-10-28 (×6): 200 mg via ORAL
  Filled 2023-10-25 (×6): qty 1

## 2023-10-25 MED ORDER — SODIUM CHLORIDE 0.9 % IV BOLUS
500.0000 mL | Freq: Once | INTRAVENOUS | Status: AC
  Administered 2023-10-25: 500 mL via INTRAVENOUS

## 2023-10-25 MED ORDER — ROPINIROLE HCL 0.25 MG PO TABS
0.2500 mg | ORAL_TABLET | Freq: Three times a day (TID) | ORAL | Status: DC
  Administered 2023-10-25: 0.25 mg via ORAL
  Filled 2023-10-25: qty 1

## 2023-10-25 MED ORDER — SODIUM CHLORIDE 0.9 % IV BOLUS
250.0000 mL | Freq: Once | INTRAVENOUS | Status: AC
  Administered 2023-10-25: 250 mL via INTRAVENOUS

## 2023-10-25 MED ORDER — DILTIAZEM HCL ER COATED BEADS 120 MG PO CP24
120.0000 mg | ORAL_CAPSULE | Freq: Every morning | ORAL | Status: DC
  Administered 2023-10-25: 120 mg via ORAL
  Filled 2023-10-25: qty 1

## 2023-10-25 MED ORDER — SODIUM CHLORIDE 0.9 % IV BOLUS: 250.0000 mL | Freq: Once | INTRAVENOUS | Status: DC

## 2023-10-25 MED ORDER — HYDROXYZINE HCL 25 MG PO TABS
25.0000 mg | ORAL_TABLET | Freq: Every evening | ORAL | Status: DC | PRN
  Administered 2023-10-25: 25 mg via ORAL
  Filled 2023-10-25: qty 1

## 2023-10-25 MED ORDER — METOPROLOL TARTRATE 5 MG/5ML IV SOLN
2.5000 mg | INTRAVENOUS | Status: AC | PRN
  Administered 2023-10-25 (×2): 2.5 mg via INTRAVENOUS
  Filled 2023-10-25 (×2): qty 5

## 2023-10-25 MED ORDER — LEVALBUTEROL HCL 0.63 MG/3ML IN NEBU
0.6300 mg | INHALATION_SOLUTION | Freq: Three times a day (TID) | RESPIRATORY_TRACT | Status: DC | PRN
  Administered 2023-10-25 (×2): 0.63 mg via RESPIRATORY_TRACT
  Filled 2023-10-25 (×2): qty 3

## 2023-10-25 MED ORDER — MAGNESIUM SULFATE IN D5W 1-5 GM/100ML-% IV SOLN
1.0000 g | Freq: Once | INTRAVENOUS | Status: AC
  Administered 2023-10-25: 1 g via INTRAVENOUS
  Filled 2023-10-25: qty 100

## 2023-10-25 MED ORDER — METOPROLOL TARTRATE 5 MG/5ML IV SOLN: 2.5000 mg | INTRAVENOUS | Status: DC | PRN

## 2023-10-25 MED ORDER — POTASSIUM CHLORIDE CRYS ER 20 MEQ PO TBCR
40.0000 meq | EXTENDED_RELEASE_TABLET | Freq: Once | ORAL | Status: AC
  Administered 2023-10-25: 40 meq via ORAL
  Filled 2023-10-25: qty 2

## 2023-10-25 MED ORDER — MAGNESIUM SULFATE 2 GM/50ML IV SOLN
2.0000 g | Freq: Once | INTRAVENOUS | Status: AC
  Administered 2023-10-25: 2 g via INTRAVENOUS
  Filled 2023-10-25: qty 50

## 2023-10-25 MED ORDER — APIXABAN 5 MG PO TABS
5.0000 mg | ORAL_TABLET | Freq: Two times a day (BID) | ORAL | Status: DC
  Administered 2023-10-25: 5 mg via ORAL
  Filled 2023-10-25 (×2): qty 1

## 2023-10-25 MED ORDER — IOHEXOL 350 MG/ML SOLN
100.0000 mL | Freq: Once | INTRAVENOUS | Status: AC | PRN
  Administered 2023-10-25: 100 mL via INTRAVENOUS

## 2023-10-25 MED ORDER — ROPINIROLE HCL 0.25 MG PO TABS
0.2500 mg | ORAL_TABLET | Freq: Every day | ORAL | Status: DC
Start: 2023-10-26 — End: 2023-10-28
  Administered 2023-10-26 – 2023-10-27 (×2): 0.25 mg via ORAL
  Filled 2023-10-25 (×3): qty 1

## 2023-10-25 NOTE — Plan of Care (Signed)
  Problem: Cardiovascular: Goal: Vascular access site(s) Level 0-1 will be maintained Outcome: Progressing   

## 2023-10-25 NOTE — Discharge Instructions (Signed)
[facility]  Discharge Instructions  Lower Extremity Angiogram; Angioplasty/Stenting  Please refer to the following instructions for your post-procedure care. Your surgeon or physician assistant will discuss any changes with you.  Activity  Avoid lifting more than 8 pounds (1 gallons of milk) for 72 hours (3 days) after your procedure. You may walk as much as you can tolerate. It's OK to drive after 72 hours.  Bathing/Showering  You may shower the day after your procedure. If you have a bandage, you may remove it at 24- 48 hours. Clean your incision site with mild soap and water. Pat the area dry with a clean towel.  Diet  Resume your pre-procedure diet. There are no special food restrictions following this procedure. All patients with peripheral vascular disease should follow a low fat/low cholesterol diet. In order to heal from your surgery, it is CRITICAL to get adequate nutrition. Your body requires vitamins, minerals, and protein. Vegetables are the best source of vitamins and minerals. Vegetables also provide the perfect balance of protein. Processed food has little nutritional value, so try to avoid this.  Medications  Resume taking all of your medications unless your doctor tells you not to. If your incision is causing pain, you may take over-the-counter pain relievers such as acetaminophen (Tylenol)  Follow Up  Follow up will be arranged at the time of your procedure. You may have an office visit scheduled or may be scheduled for surgery. Ask your surgeon if you have any questions.  Please call us immediately for any of the following conditions: Severe or worsening pain your legs or feet at rest or with walking. Increased pain, redness, drainage at your groin puncture site. Fever of 101 degrees or higher. If you have any mild or slow bleeding from your puncture site: lie down, apply firm constant pressure over the area with a piece of  gauze or a clean wash cloth for 30 minutes- no peeking!, call 911 right away if you are still bleeding after 30 minutes, or if the bleeding is heavy and unmanageable.  Reduce your risk factors of vascular disease:  Stop smoking. If you would like help call [facility] at 1-800-QUIT-NOW (973-800-6662) or [place] at [phone]. Manage your cholesterol Maintain a desired weight Control your diabetes Keep your blood pressure down  If you have any questions, please call the office at [phone]

## 2023-10-25 NOTE — Progress Notes (Signed)
Upon assessment of patient's right arm, increased redness to [facility] and slightly below. Warm to touch. Some firmness noted above AC.  Patient c/o increased discomfort in right arm.

## 2023-10-25 NOTE — Progress Notes (Incomplete)
Pending

## 2023-10-25 NOTE — Plan of Care (Signed)
  Problem: Education: Goal: Understanding of CV disease, CV risk reduction, and recovery process will improve Outcome: Progressing   Problem: Activity: Goal: Ability to return to baseline activity level will improve 10/25/2023 1703 by Stormy Card, RN Outcome: Progressing 10/25/2023 1652 by Stormy Card, RN Outcome: Progressing   Problem: Cardiovascular: Goal: Ability to achieve and maintain adequate cardiovascular perfusion will improve 10/25/2023 1703 by Stormy Card, RN Outcome: Progressing 10/25/2023 1652 by Stormy Card, RN Outcome: Progressing Goal: Vascular access site(s) Level 0-1 will be maintained Outcome: Progressing   Problem: Health Behavior/Discharge Planning: Goal: Ability to safely manage health-related needs after discharge will improve 10/25/2023 1703 by Stormy Card, RN Outcome: Progressing 10/25/2023 1652 by Stormy Card, RN Outcome: Progressing

## 2023-10-25 NOTE — Progress Notes (Signed)
PROGRESS NOTE    Linda Brady  ZOX:[id number] DOB: 1941/06/04 DOA: 10/23/2023 PCP: [name], MD   Brief Narrative:  This 82 y.o. female with medical history significant of CAD, COPD, hypertension, dyslipidemia, GERD, hyponatremia, HFpEF, aortic stenosis, paroxysmal atrial fibrillation with RVR, hypothyroidism, restless leg syndrome, and anxiety/depression. She had  diagnostic right and left heart cath with right IJ and right radial approach.  Post-procedure she experienced (R) arm pain, manual pressure was  held by nursing staff, and she was evaluated in PACU, at that time she did not have a hematoma on exam. She was instructed to hold Eliquis until Monday and discharged home.  Last night she presented to [facility] Emergency Department with ongoing right upper extremity pain and bruising. CTA of right upper extremity obtained and shows focal area of dissection in the proximal brachial artery, there is also a pseudoaneurysm in the medial aspect of the right upper arm.  Vascular surgery was consulted.  Patient admitted for further evaluation. Assessment & Plan:   Principal Problem:   Dissection of right brachial artery (HCC) Active Problems:   Essential hypertension   Anxiety   GERD (gastroesophageal reflux disease)   Hyponatremia   Hypothyroidism   Chronic heart failure with preserved ejection fraction (HFpEF) (HCC)   Aortic stenosis, severe   COPD (chronic obstructive pulmonary disease) (HCC)   Dyslipidemia   Afib (HCC)   Restless leg syndrome   Brachial artery dissection: CTA right arm with focal area of dissection in the proximal brachial artery, there is also pseudoaneurysm in the medial aspect of the right upper arm Eliquis kept on hold. Eliquis resumed pot OP. Vascular surgery consulted.  Patient underwent endovascular repair of the right axillary artery pseudoaneurysm. Nursing neurovascular checks Follow-up vascular surgery.   Atrial Fibrillation: Continue home  Cardizem.  Heart rate is not controlled. Continue Eliquis.  Continue metoprolol 2.5 mg IV every 6 hours for HR control.   HFpEF: Severe Aortic Stenosis: LVEF 65 to 70% in October 2024. Hold home hydrochlorothiazide in setting of hyponatremia Continue home Lasix .   COPD: Not in acute exacerbation. Continue home Symbicort PRN Albuterol nebulizer Continue home supplemental O2 PRN   Hyponatremia: Hold home Hydrochlorothiazide. Monitor sodium level.   Hypertension: Hold Vasotec, Imdur,  BP on soft side. Hold home hydrochlorothiazide.   CAD Hyperlipidemia: Continue home atorvastatin.   Hypothyroidism Continue levothyroxine.   Restless Leg Syndrome Continue Requip   GERD: Continue Protonix.   Generalized Anxiety Disorder: Continue home Prozac. Continue PRN qHS Xanax.   DVT prophylaxis: SCDs Code Status: Full code Family Communication: No family at bed side Disposition Plan:    Status is: Observation The patient remains OBS appropriate and will d/c before 2 midnights.  Admitted for pseudoaneurysm vascular surgery is consulted, underwent endovascular repair of the axillary artery pseudoaneurysm   Consultants:  Vascular surgery  Procedures:   Antimicrobials: Anti-infectives (From admission, onward)    None      Subjective: Patient was seen and examined at bedside.  Overnight events noted.   Patient reports doing better.  Status post aneurysmal repair. POD 1 Patient reports doing fine,   Objective: Vitals:   10/25/23 0231 10/25/23 0350 10/25/23 0430 10/25/23 0800  BP: 95/65 (!) 83/51 99/64 97/67   Pulse: (!) 124 (!) 117 (!) 126 (!) 142  Resp: 18 18 20    Temp:    97.6 F (36.4 C)  TempSrc:    Axillary  SpO2: 97% 99% 98% 99%    Intake/Output Summary (Last 24  hours) at 10/25/2023 1329 Last data filed at 10/25/2023 0020 Gross per 24 hour  Intake 242.55 ml  Output 0 ml  Net 242.55 ml   There were no vitals filed for this  visit.  Examination:  General exam: Appears calm and comfortable, deconditioned, not in any acute distress. Respiratory system: CTA bilaterally. Respiratory effort normal.  RR 15 Cardiovascular system: S1 & S2 heard, RRR. No JVD, murmurs, rubs, gallops or clicks.  Gastrointestinal system: Abdomen is non distended, soft and non tender. Normal bowel sounds heard. Central nervous system: Alert and oriented x 3. No focal neurological deficits. Extremities: No edema, no cyanosis, no clubbing, RUE Skin: No rashes, lesions or ulcers Psychiatry: Judgement and insight appear normal. Mood & affect appropriate.     Data Reviewed: I have personally reviewed following labs and imaging studies  CBC: Recent Labs  Lab 10/23/23 1130 10/23/23 1131 10/23/23 2152 10/24/23 0312 10/25/23 0259  WBC  --   --  12.1*  --  12.2*  HGB 10.9* 10.5* 10.4* 10.2* 8.8*  HCT 32.0* 31.0* 31.8* 31.6* 27.4*  MCV  --   --  92.7  --  92.6  PLT  --   --  382  --  311   Basic Metabolic Panel: Recent Labs  Lab 10/23/23 1127 10/23/23 1130 10/23/23 1131 10/23/23 2152 10/25/23 0259  NA 137 137 139 130* 131*  K 3.3* 3.3* 3.2* 3.6 3.4*  CL  --   --   --  97* 96*  CO2  --   --   --  24 24  GLUCOSE  --   --   --  131* 122*  BUN  --   --   --  9 11  CREATININE  --   --   --  0.88 1.11*  CALCIUM  --   --   --  9.2 8.8*  MG  --   --   --   --  1.6*   GFR: Estimated Creatinine Clearance: 41 mL/min (A) (by C-G formula based on SCr of 1.11 mg/dL (H)). Liver Function Tests: No results for input(s): "AST", "ALT", "ALKPHOS", "BILITOT", "PROT", "ALBUMIN" in the last 168 hours. No results for input(s): "LIPASE", "AMYLASE" in the last 168 hours. No results for input(s): "AMMONIA" in the last 168 hours. Coagulation Profile: No results for input(s): "INR", "PROTIME" in the last 168 hours. Cardiac Enzymes: No results for input(s): "CKTOTAL", "CKMB", "CKMBINDEX", "TROPONINI" in the last 168 hours. BNP (last 3  results) Recent Labs    10/15/23 1521  PROBNP 533   HbA1C: No results for input(s): "HGBA1C" in the last 72 hours. CBG: Recent Labs  Lab 10/24/23 2116 10/25/23 0603  GLUCAP 117* 107*   Lipid Profile: No results for input(s): "CHOL", "HDL", "LDLCALC", "TRIG", "CHOLHDL", "LDLDIRECT" in the last 72 hours. Thyroid Function Tests: No results for input(s): "TSH", "T4TOTAL", "FREET4", "T3FREE", "THYROIDAB" in the last 72 hours. Anemia Panel: No results for input(s): "VITAMINB12", "FOLATE", "FERRITIN", "TIBC", "IRON", "RETICCTPCT" in the last 72 hours. Sepsis Labs: No results for input(s): "PROCALCITON", "LATICACIDVEN" in the last 168 hours.  No results found for this or any previous visit (from the past 240 hour(s)).   Radiology Studies: PERIPHERAL VASCULAR CATHETERIZATION  Result Date: 10/24/2023 DATE OF SERVICE: 10/24/2023  PATIENT:  Talbert Forest A Mcleary  82 y.o. female  PRE-OPERATIVE DIAGNOSIS:  right axillary artery pseudoaneurysm  POST-OPERATIVE DIAGNOSIS:  Same  PROCEDURE:  1) Ultrasound guided right common femoral artery access 2) Arch aortogram 3) Innominate angiogram  4) Right upper extremity angiogram 5) Ultrasound guided right radial artery access 6) Endovascular repair of right axillary artery pseudoaneurysm (7x19mm Viabahn) 7) Conscious sedation (99 minutes)  SURGEON:  Rande Brunt. Lenell Antu, MD  ASSISTANT: none  ANESTHESIA:   local and IV sedation  ESTIMATED BLOOD LOSS: minimal  LOCAL MEDICATIONS USED:  LIDOCAINE  COUNTS: confirmed correct.  PATIENT DISPOSITION:  PACU - hemodynamically stable.  Delay start of Pharmacological VTE agent (>24hrs) due to surgical blood loss or risk of bleeding: no  INDICATION FOR PROCEDURE: DERONDA ELDER is a 82 y.o. 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 After careful discussion of risks, benefits, and alternatives the patient was offered endovascular repair. The patient understood and wished to proceed.  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 The patient was positioned supine on the operating room table. Anesthesia was induced. The groins was prepped and draped in standard fashion. A surgical pause was performed confirming correct patient, procedure, and operative location.  The right groin was anesthetized with subcutaneous injection of 1% lidocaine. Using ultrasound guidance, the right common femoral artery was accessed with micropuncture technique. Fluoroscopy was used to confirm cannulation over the femoral head. The 321F micropuncture sheath was upsized to 21F.  The patient was systemically heparinized.  A Glidewire advantage was navigated into the ascending aorta.  A pigtail catheter was advanced into the descending aorta.  Arch angiogram was performed.  Multiple catheter and wire exchanges were used to try to cannulate the innominate artery.  This was very challenging given the highly angulated nature of the aortic arch.  We changed our projection into an extreme left anterior oblique and caudal projection or able to eliminate some of the parallax in her arch.  I was able to select the innominate artery, but I was not able to deliver a wire retrograde into the axillary artery without it "kicking out".  The right radial artery was accessed with micropuncture technique.  A 4/5 French slender sheath was introduced into the radial artery after singlewall access.  A J-wire was navigated across the area of pseudoaneurysm atraumatically under fluoroscopic guidance.  The wire was advanced into the ascending aorta.  Over the wire a catheter was advanced into the ascending aorta.  A loop snare  was advanced into the ascending aorta.  Using a combination of a barrier to catheter and Glidewire I was able to engage the loop snare from the femoral access and externalized the wire from the radial access.  With this system built, I was able to deliver a 7 [place] by 90 cm sheath into the right upper extremity subclavian artery.  Selective angiography was performed of the right upper extremity.  The pseudoaneurysm identified and marked on the screen.  Her wire was exchanged for a V18 wire.  A 7 x 50 mm Viabahn stent was positioned across the pseudoaneurysm to avoid covering the profunda arteries.  The stent was deployed in standard fashion with good technical result.  The stent was placed angioplastied.  Per follow-up angiogram showed resolution of the pseudoaneurysm.  Satisfied we ended the case here.  All endovascular equipment was removed.  I attempted to use a Perclose device to close the groin.  We lost wire  access and manual pressure was held.  Conscious sedation was administered with the use of IV fentanyl and midazolam under continuous physician and nurse monitoring.  Heart rate, blood pressure, and oxygen saturation were continuously monitored.  Total sedation time was 99 minutes  Upon completion of the case instrument and sharps counts were confirmed correct. The patient was transferred to the PACU in good condition. I was present for all portions of the procedure.  PLAN: ASA 81mg  PO every day. Plavix 75mg  PO every day. High intensity statin therapy.  Rande Brunt. Lenell Antu, MD [facility] Phone Number: [phone] 10/24/2023 5:07 PM   CT ANGIO UP EXTREM RIGHT W &/OR WO CONTRAST  Result Date: 10/23/2023 CLINICAL DATA:  History of recent catheterization via the right radial artery with increasing arm pain and numbness initial encounter EXAM: CT ANGIOGRAPHY OF THE RIGHT UPPEREXTREMITY TECHNIQUE: Multidetector CT imaging of the right upper extremitywas performed  using the standard protocol during bolus administration of intravenous contrast. Multiplanar CT image reconstructions and MIPs were obtained to evaluate the vascular anatomy. RADIATION DOSE REDUCTION: This exam was performed according to the departmental dose-optimization program which includes automated exposure control, adjustment of the mA and/or kV according to patient size and/or use of iterative reconstruction technique. CONTRAST:  90mL OMNIPAQUE IOHEXOL 350 MG/ML SOLN COMPARISON:  None Available. FINDINGS: Vascular: The thoracic aorta demonstrates atherosclerotic calcifications without aneurysmal dilatation or dissection. Coronary calcifications are seen. No significant cardiac enlargement is noted. No pulmonary emboli are seen. Right innominate artery demonstrates mild atherosclerotic calcifications. Normal bifurcation into the right common carotid artery and right subclavian artery is noted. Axillary artery on the right appears within normal limits. The origins of the subscapular artery and posterior circumflex humeral artery are well visualized and within normal limits. Just beyond this level in the proximal aspect of the brachial artery there is a focal area of dissection identified best visualized from images number 155 to 177 of series 6. A small branch vessel arises from the false lumen and supplies a somewhat bilobed collection of contrast measuring up to 18 mm in greatest transverse dimension. The bilobed nature of the pseudoaneurysm measures up to 3.3 cm in craniocaudad projection. These changes are likely related to a pseudoaneurysm arising from the branch vessel. The more distal brachial artery appears within normal limits. Bifurcation into the radial and ulnar arteries appear within normal limits. The radial and ulnar arteries are visualized patent to the level of right wrist. Nonvascular: Visualized lung fields appear within normal limits with the exception of mild scarring in the right middle  lobe. Visualized portions of the liver and right kidney show no acute abnormality. A hiatal hernia is noted of a moderate degree. Visualized abdominal and pelvic structures are within normal limits. No acute bony abnormality is noted. 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 Considerable subcutaneous edema is noted in the upper arm and axillary region. This may represent some mild prior hemorrhage as well. Review of the MIP images confirms the above findings. IMPRESSION: Focal area of dissection in the proximal aspect of the brachial artery just beyond the origin of the subscapular artery. Arising from the false lumen there is a small branch vessel identified with supply of a bilobed pseudoaneurysm in the medial aspect of the right upper arm/axilla. Some local compression on the adjacent nerves may contribute to the patient's peripheral numbness. Vascular surgery consultation is recommended. Patent distal brachial artery as well as patent  radial and ulnar arteries. No acute nonvascular abnormality is noted. Critical Value/emergent results were called by telephone at the time of interpretation on 10/23/2023 at 11:39 pm to [facility] MD, who verbally acknowledged these results. Electronically Signed   By: Alcide Clever M.D.   On: 10/23/2023 23:39     Scheduled Meds:  apixaban  5 mg Oral BID   aspirin EC  81 mg Oral Daily   atorvastatin  40 mg Oral Daily   clopidogrel  75 mg Oral Daily   diltiazem  120 mg Oral q AM   enalapril  20 mg Oral BID   fluticasone furoate-vilanterol  1 puff Inhalation Daily   furosemide  20 mg Oral BID   isosorbide mononitrate  30 mg Oral Daily   levothyroxine  75 mcg Oral Daily   pantoprazole  40 mg Oral BID   sodium chloride flush  3 mL Intravenous Q12H   Continuous Infusions:  sodium chloride       LOS: 0 days    Time spent: 50 mins    Willeen Niece, MD [facility]   If 7PM-7AM, please contact night-coverage

## 2023-10-25 NOTE — Consult Note (Addendum)
Cardiology Consultation   Patient ID: Linda Brady MRN: [id number]; DOB: Sep 26, 1941  Admit date: 10/23/2023 Date of Consult: 10/25/2023  PCP:  [name], MD   [facility] Providers Cardiologist:  Debbe Odea, MD      Patient Profile:   Linda Brady is a 82 y.o. female with a hx of nonobstructive CAD, COPD, hypertension, hypothyroidism, restless leg syndrome, anxiety, depression, aortic stenosis, atrial fibrillation who is being seen 10/25/2023 for the evaluation of atrial fibrillation at the request of Dr. Idelle Leech  History of Present Illness:   Linda Brady is an 82 year old female with past medical history noted above.  She has been followed by Dr. Azucena Cecil as an outpatient.  Underwent cardiac catheterization 2021 with 50% proximal RCA lesion and 30% LAD lesion.  Seen 12/2021 with chest pain and found to have mildly elevated troponins which were felt to be demand ischemia.  Echocardiogram showed preserved LVEF and she was started on Imdur.  Underwent outpatient Myoview with no ischemia, overall low risk.  Admitted 10/17 and found to have new onset atrial fibrillation treated with IV Cardizem and converted to sinus rhythm.  She was placed on Eliquis.  Echocardiogram during that admission showed LVEF of 65 to 70%, grade 2 diastolic dysfunction, mild MR, mild AI, severe aortic stenosis with a mean gradient of 49.2 mmHg.  She was seen back in the office 10/30 with Cadence Furth, and referred to the structural heart team for consideration of TAVR.  Continued on diltiazem 120 mg daily as well as Eliquis 5 mg twice daily.   Underwent outpatient right and left heart catheterization 11/21 with Dr. Lynnette Caffey.  Post cath she was noted to have a right arm hematoma.  Site was evaluated prior to discharge.  She was instructed to resume her Eliquis 11/25.  Presented back to the ED later that evening with complaints of worsening arm pain.  She underwent CTA which  demonstrated upper arm/axilla brachial artery pseudoaneurysm.  Seen by vascular and underwent endovascular repair of right brachial artery that evening with Dr. Lenell Antu.  It was recommended that she be placed on aspirin and Plavix.  The morning of 11/23 she developed atrial fibrillation with RVR with blood pressures in the 80-90 range systolic. hsTn 68>>537. Cardiology asked to evaluate.  Past Medical History:  Diagnosis Date   COPD (chronic obstructive pulmonary disease) (HCC)    GERD (gastroesophageal reflux disease)    Hypertension    Hypothyroidism    Thyroid disease     Past Surgical History:  Procedure Laterality Date   BREAST CYST ASPIRATION Left 10 plus yrs   benign-twice   CATARACT EXTRACTION W/PHACO Left 03/20/2023   Procedure: CATARACT EXTRACTION PHACO AND INTRAOCULAR LENS PLACEMENT (IOC) LEFT VISION BLUE;  Surgeon: Estanislado Pandy, MD;  Location: [facility];  Service: Ophthalmology;  Laterality: Left;  21.02   01:38.2   LEFT HEART CATH AND CORONARY ANGIOGRAPHY N/A 09/11/2020   Procedure: LEFT HEART CATH AND CORONARY ANGIOGRAPHY;  Surgeon: Marcina Millard, MD;  Location: [facility]AS[facility] CV [facility];  Service: Cardiovascular;  Laterality: N/A;   RIGHT/LEFT HEART CATH AND CORONARY ANGIOGRAPHY N/A 10/23/2023   Procedure: RIGHT/LEFT HEART CATH AND CORONARY ANGIOGRAPHY;  Surgeon: Orbie Pyo, MD;  Location: [facility] INVASIVE CV LAB;  Service: Cardiovascular;  Laterality: N/A;     Inpatient Medications: Scheduled Meds:  apixaban  5 mg Oral BID   aspirin EC  81 mg Oral Daily   atorvastatin  40 mg Oral Daily   clopidogrel  75 mg Oral Daily   diltiazem  120 mg Oral q AM   enalapril  20 mg Oral BID   fluticasone furoate-vilanterol  1 puff Inhalation Daily   furosemide  20 mg Oral BID   isosorbide mononitrate  30 mg Oral Daily   levothyroxine  75 mcg Oral Daily   pantoprazole  40 mg Oral BID   sodium chloride flush  3 mL Intravenous Q12H   Continuous Infusions:   sodium chloride     PRN Meds: sodium chloride, acetaminophen, ALPRAZolam, FLUoxetine, hydrALAZINE, levalbuterol, metoprolol tartrate, morphine injection, ondansetron **OR** ondansetron (ZOFRAN) IV, sodium chloride flush  Allergies:    Allergies  Allergen Reactions   Naproxen Sodium Anaphylaxis    Social History:   Social History   Socioeconomic History   Marital status: Widowed    Spouse name: Not on file   Number of children: Not on file   Years of education: Not on file   Highest education level: Not on file  Occupational History   Not on file  Tobacco Use   Smoking status: Former    Current packs/day: 0.50    Average packs/day: 0.5 packs/day for 12.0 years (6.0 ttl pk-yrs)    Types: Cigarettes   Smokeless tobacco: Never   Tobacco comments:    7-8 cigarettes a day  Vaping Use   Vaping status: Never Used  Substance and Sexual Activity   Alcohol use: Not Currently   Drug use: Not Currently   Sexual activity: Not Currently  Other Topics Concern   Not on file  Social History Narrative   Not on file   Social Determinants of Health   Financial Resource Strain: Low Risk  (09/30/2023)   Received from [facility]   Overall Financial Resource Strain (CARDIA)    Difficulty of Paying Living Expenses: Not hard at all  Food Insecurity: No Food Insecurity (10/24/2023)   Hunger Vital Sign    Worried About Running Out of Food in the Last Year: Never true    Ran Out of Food in the Last Year: Never true  Transportation Needs: No Transportation Needs (10/24/2023)   PRAPARE - Administrator, Civil Service (Medical): No    Lack of Transportation (Non-Medical): No  Physical Activity: Not on file  Stress: Not on file  Social Connections: Not on file  Intimate Partner Violence: Not At Risk (10/24/2023)   Humiliation, Afraid, Rape, and Kick questionnaire    Fear of Current or Ex-Partner: No    Emotionally Abused: No    Physically Abused: No     Sexually Abused: No    Family History:    Family History  Problem Relation Age of Onset   Breast cancer Neg Hx      ROS:  Please see the history of present illness.   All other ROS reviewed and negative.     Physical Exam/Data:   Vitals:   10/25/23 0231 10/25/23 0350 10/25/23 0430 10/25/23 0800  BP: 95/65 (!) 83/51 99/64 97/67   Pulse: (!) 124 (!) 117 (!) 126 (!) 142  Resp: 18 18 20    Temp:    97.6 F (36.4 C)  TempSrc:    Axillary  SpO2: 97% 99% 98% 99%    Intake/Output Summary (Last 24 hours) at 10/25/2023 1543 Last data filed at 10/25/2023 0020 Gross per 24 hour  Intake 0 ml  Output 0 ml  Net 0 ml      10/23/2023   10:45 AM 10/15/2023  1:17 PM 10/01/2023    2:31 PM  Last 3 Weights  Weight (lbs) 181 lb 181 lb 185 lb 6.4 oz  Weight (kg) 82.101 kg 82.101 kg 84.097 kg     There is no height or weight on file to calculate BMI.  General:  Well nourished, well developed, in no acute distress HEENT: normal Neck: no JVD Vascular: No carotid bruits; Distal pulses 2+ bilaterally Cardiac:  normal S1, S2; RRR; 3/4 harsh systolic RUSB murmur  Lungs:  clear to auscultation bilaterally, no wheezing, rhonchi or rales  Abd: soft, nontender, no hepatomegaly  Ext: no edema Musculoskeletal:  RUE with bruising  Skin: warm and dry  Neuro:  CNs 2-12 intact, no focal abnormalities noted Psych:  Normal affect   EKG:  The EKG was personally reviewed and demonstrates:  Atrial fibrillation with RVR, 149 bpm, LVH  Telemetry:  Telemetry was personally reviewed and demonstrates:  Atrial fibrillation RVR with conversion to sinus rhythm around 9am  Relevant CV Studies:  Echo: 09/2023  IMPRESSIONS     1. Left ventricular ejection fraction, by estimation, is 65 to 70%. The  left ventricle has normal function. The left ventricle has no regional  wall motion abnormalities. Left ventricular diastolic parameters are  consistent with Grade II diastolic  dysfunction  (pseudonormalization).   2. Right ventricular systolic function is normal. The right ventricular  size is normal. There is mildly elevated pulmonary artery systolic  pressure.   3. The mitral valve is degenerative. Mild mitral valve regurgitation.   4. The aortic valve is calcified. Aortic valve regurgitation is mild.  Severe aortic valve stenosis. Aortic valve area, by VTI measures 0.99 cm.  Aortic valve mean gradient measures 49.2 mmHg. Aortic valve Vmax measures  4.56 m/s.   5. The inferior vena cava is normal in size with greater than 50%  respiratory variability, suggesting right atrial pressure of 3 mmHg.   Conclusion(s)/Recommendation(s): Findings consistent with severe valvular  heart disease.   FINDINGS   Left Ventricle: Left ventricular ejection fraction, by estimation, is 65  to 70%. The left ventricle has normal function. The left ventricle has no  regional wall motion abnormalities. The left ventricular internal cavity  size was normal in size. There is   no left ventricular hypertrophy. Left ventricular diastolic parameters  are consistent with Grade II diastolic dysfunction (pseudonormalization).   Right Ventricle: The right ventricular size is normal. No increase in  right ventricular wall thickness. Right ventricular systolic function is  normal. There is mildly elevated pulmonary artery systolic pressure. The  tricuspid regurgitant velocity is 2.71   m/s, and with an assumed right atrial pressure of 8 mmHg, the estimated  right ventricular systolic pressure is 37.4 mmHg.   Left Atrium: Left atrial size was normal in size.   Right Atrium: Right atrial size was normal in size.   Pericardium: There is no evidence of pericardial effusion.   Mitral Valve: The mitral valve is degenerative in appearance. Mild to  moderate mitral annular calcification. Mild mitral valve regurgitation. MV  peak gradient, 7.5 mmHg. The mean mitral valve gradient is 3.0 mmHg.    Tricuspid Valve: The tricuspid valve is normal in structure. Tricuspid  valve regurgitation is mild.   Aortic Valve: The aortic valve is calcified. Aortic valve regurgitation is  mild. Aortic regurgitation PHT measures 436 msec. Severe aortic stenosis  is present. Aortic valve mean gradient measures 49.2 mmHg. Aortic valve  peak gradient measures 83.1 mmHg.  Aortic valve area, by VTI  measures 0.99 cm.   Pulmonic Valve: The pulmonic valve was not well visualized. Pulmonic valve  regurgitation is trivial.   Aorta: The aortic root and ascending aorta are structurally normal, with  no evidence of dilitation.   Venous: The inferior vena cava is normal in size with greater than 50%  respiratory variability, suggesting right atrial pressure of 3 mmHg.   IAS/Shunts: No atrial level shunt detected by color flow Doppler.   R/LHC: 10/23/2023    Prox LAD lesion is 30% stenosed.   Ost RCA to Prox RCA lesion is 20% stenosed.   1.  Mild nonobstructive coronary artery disease. 2.  Fick cardiac output of 9.0 L/min and Fick cardiac index of 4.9 L/min/m with the following hemodynamics             Right atrial pressure mean of 7 mmHg             Right ventricular pressure 48/27 with end-diastolic pressure of 14 mmHg             PA pressure 48/17 with a mean of 29 mmHg             Wedge pressure mean of 18 mmHg V waves to 27 mmHg             PVR 1.2 Woods units             PA pulsatility index of 4   Recommendation: Continue evaluation for aortic valve intervention.  Laboratory Data:  High Sensitivity Troponin:   Recent Labs  Lab 10/25/23 0259 10/25/23 0647  TROPONINIHS 68* 537*     Chemistry Recent Labs  Lab 10/23/23 1131 10/23/23 2152 10/25/23 0259  NA 139 130* 131*  K 3.2* 3.6 3.4*  CL  --  97* 96*  CO2  --  24 24  GLUCOSE  --  131* 122*  BUN  --  9 11  CREATININE  --  0.88 1.11*  CALCIUM  --  9.2 8.8*  MG  --   --  1.6*  GFRNONAA  --  >60 50*  ANIONGAP  --  9 11     No results for input(s): "PROT", "ALBUMIN", "AST", "ALT", "ALKPHOS", "BILITOT" in the last 168 hours. Lipids No results for input(s): "CHOL", "TRIG", "HDL", "LABVLDL", "LDLCALC", "CHOLHDL" in the last 168 hours.  Hematology Recent Labs  Lab 10/23/23 2152 10/24/23 0312 10/25/23 0259  WBC 12.1*  --  12.2*  RBC 3.43*  --  2.96*  HGB 10.4* 10.2* 8.8*  HCT 31.8* 31.6* 27.4*  MCV 92.7  --  92.6  MCH 30.3  --  29.7  MCHC 32.7  --  32.1  RDW 13.8  --  13.6  PLT 382  --  311   Thyroid No results for input(s): "TSH", "FREET4" in the last 168 hours.  BNPNo results for input(s): "BNP", "PROBNP" in the last 168 hours.  DDimer No results for input(s): "DDIMER" in the last 168 hours.   Radiology/Studies:  PERIPHERAL VASCULAR CATHETERIZATION  Result Date: 10/24/2023 DATE OF SERVICE: 10/24/2023  PATIENT:  Talbert Forest A Jonsson  82 y.o. female  PRE-OPERATIVE DIAGNOSIS:  right axillary artery pseudoaneurysm  POST-OPERATIVE DIAGNOSIS:  Same  PROCEDURE:  1) Ultrasound guided right common femoral artery access 2) Arch aortogram 3) Innominate angiogram 4) Right upper extremity angiogram 5) Ultrasound guided right radial artery access 6) Endovascular repair of right axillary artery pseudoaneurysm (7x74mm Viabahn) 7) Conscious sedation (99 minutes)  SURGEON:  Rande Brunt. Lenell Antu, MD  ASSISTANT:  none  ANESTHESIA:   local and IV sedation  ESTIMATED BLOOD LOSS: minimal  LOCAL MEDICATIONS USED:  LIDOCAINE  COUNTS: confirmed correct.  PATIENT DISPOSITION:  PACU - hemodynamically stable.  Delay start of Pharmacological VTE agent (>24hrs) due to surgical blood loss or risk of bleeding: no  INDICATION FOR PROCEDURE: CABRINA DAHLGREN is a 82 y.o. 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 After careful discussion of risks, benefits, and alternatives the patient was offered endovascular repair. The patient understood and wished to proceed.  OPERATIVE FINDINGS: Type III arch with highly angulated  right subclavian artery Ultimately able to cross lesion by accessing right radial artery and snaring wire Pseudoaneurysm successfully treated by covered angioplasty and stenting  DESCRIPTION OF PROCEDURE: After identification of the patient in the pre-operative holding area, the patient was transferred to the operating room. The patient was positioned supine on the operating room table. Anesthesia was induced. The groins was prepped and draped in standard fashion. A surgical pause was performed confirming correct patient, procedure, and operative location.  The right groin was anesthetized with subcutaneous injection of 1% lidocaine. Using ultrasound guidance, the right common femoral artery was accessed with micropuncture technique. Fluoroscopy was used to confirm cannulation over the femoral head. The 8F micropuncture sheath was upsized to 47F.  The patient was systemically heparinized.  A Glidewire advantage was navigated into the ascending aorta.  A pigtail catheter was advanced into the descending aorta.  Arch angiogram was performed.  Multiple catheter and wire exchanges were used to try to cannulate the innominate artery.  This was very challenging given the highly angulated nature of the aortic arch.  We changed our projection into an extreme left anterior oblique and caudal projection or able to eliminate some of the parallax in her arch.  I was able to select the innominate artery, but I was not able to deliver a wire retrograde into the axillary artery without it "kicking out".  The right radial artery was accessed with micropuncture technique.  A 4/5 French slender sheath was introduced into the radial artery after singlewall access.  A J-wire was navigated across the area of pseudoaneurysm atraumatically under fluoroscopic guidance.  The wire was advanced into the ascending aorta.  Over the wire a catheter was advanced into the ascending aorta.  A loop snare was advanced into the ascending aorta.  Using  a combination of a barrier to catheter and Glidewire I was able to engage the loop snare from the femoral access and externalized the wire from the radial access.  With this system built, I was able to deliver a 7 [place] by 90 cm sheath into the right upper extremity subclavian artery.  Selective angiography was performed of the right upper extremity.  The pseudoaneurysm identified and marked on the screen.  Her wire was exchanged for a V18 wire.  A 7 x 50 mm Viabahn stent was positioned across the pseudoaneurysm to avoid covering the profunda arteries.  The stent was deployed in standard fashion with good technical result.  The stent was placed angioplastied.  Per follow-up angiogram showed resolution of the pseudoaneurysm.  Satisfied we ended the case here.  All endovascular equipment was removed.  I attempted to use a Perclose device to close the groin.  We lost wire access and manual pressure was held.  Conscious sedation was administered with the use of IV fentanyl and midazolam under continuous physician and nurse monitoring.  Heart rate, blood pressure, and oxygen saturation were continuously monitored.  Total sedation time was 99 minutes  Upon completion of the case instrument and sharps counts were confirmed correct. The patient was transferred to the PACU in good condition. I was present for all portions of the procedure.  PLAN: ASA 81mg  PO every day. Plavix 75mg  PO every day. High intensity statin therapy.  Rande Brunt. Lenell Antu, MD [facility] Phone Number: [phone] 10/24/2023 5:07 PM   CT ANGIO UP EXTREM RIGHT W &/OR WO CONTRAST  Result Date: 10/23/2023 CLINICAL DATA:  History of recent catheterization via the right radial artery with increasing arm pain and numbness initial encounter EXAM: CT ANGIOGRAPHY OF THE RIGHT UPPEREXTREMITY TECHNIQUE: Multidetector CT imaging of the right upper extremitywas performed using the standard protocol during bolus  administration of intravenous contrast. Multiplanar CT image reconstructions and MIPs were obtained to evaluate the vascular anatomy. RADIATION DOSE REDUCTION: This exam was performed according to the departmental dose-optimization program which includes automated exposure control, adjustment of the mA and/or kV according to patient size and/or use of iterative reconstruction technique. CONTRAST:  90mL OMNIPAQUE IOHEXOL 350 MG/ML SOLN COMPARISON:  None Available. FINDINGS: Vascular: The thoracic aorta demonstrates atherosclerotic calcifications without aneurysmal dilatation or dissection. Coronary calcifications are seen. No significant cardiac enlargement is noted. No pulmonary emboli are seen. Right innominate artery demonstrates mild atherosclerotic calcifications. Normal bifurcation into the right common carotid artery and right subclavian artery is noted. Axillary artery on the right appears within normal limits. The origins of the subscapular artery and posterior circumflex humeral artery are well visualized and within normal limits. Just beyond this level in the proximal aspect of the brachial artery there is a focal area of dissection identified best visualized from images number 155 to 177 of series 6. A small branch vessel arises from the false lumen and supplies a somewhat bilobed collection of contrast measuring up to 18 mm in greatest transverse dimension. The bilobed nature of the pseudoaneurysm measures up to 3.3 cm in craniocaudad projection. These changes are likely related to a pseudoaneurysm arising from the branch vessel. The more distal brachial artery appears within normal limits. Bifurcation into the radial and ulnar arteries appear within normal limits. The radial and ulnar arteries are visualized patent to the level of right wrist. Nonvascular: Visualized lung fields appear within normal limits with the exception of mild scarring in the right middle lobe. Visualized portions of the liver and  right kidney show no acute abnormality. A hiatal hernia is noted of a moderate degree. Visualized abdominal and pelvic structures are within normal limits. No acute bony abnormality is noted. 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 Considerable subcutaneous edema is noted in the upper arm and axillary region. This may represent some mild prior hemorrhage as well. Review of the MIP images confirms the above findings. IMPRESSION: Focal area of dissection in the proximal aspect of the brachial artery just beyond the origin of the subscapular artery. Arising from the false lumen there is a small branch vessel identified with supply of a bilobed pseudoaneurysm in the medial aspect of the right upper arm/axilla. Some local compression on the adjacent nerves may contribute to the patient's peripheral numbness. Vascular surgery consultation is recommended. Patent distal brachial artery as well as patent radial and ulnar arteries. No acute nonvascular abnormality is noted. Critical Value/emergent results were called by telephone at the time of interpretation on 10/23/2023 at 11:39 pm to [facility] MD, who verbally acknowledged these results. Electronically Signed  By: Alcide Clever M.D.   On: 10/23/2023 23:39   CARDIAC CATHETERIZATION  Result Date: 10/23/2023   Prox LAD lesion is 30% stenosed.   Ost RCA to Prox RCA lesion is 20% stenosed. 1.  Mild nonobstructive coronary artery disease. 2.  Fick cardiac output of 9.0 L/min and Fick cardiac index of 4.9 L/min/m with the following hemodynamics  Right atrial pressure mean of 7 mmHg  Right ventricular pressure 48/27 with end-diastolic pressure of 14 mmHg  PA pressure 48/17 with a mean of 29 mmHg  Wedge pressure mean of 18 mmHg V waves to 27 mmHg  PVR 1.2 Woods units  PA pulsatility index of 4 Recommendation: Continue evaluation for aortic valve intervention.     Assessment and Plan:   TYSHAWNA STOTTLEMYRE is a 82 y.o. female with a hx of  nonobstructive CAD, COPD, hypertension, hypothyroidism, restless leg syndrome, anxiety, depression, aortic stenosis, atrial fibrillation who is being seen 10/25/2023 for the evaluation of atrial fibrillation at the request of Dr. Idelle Leech  Atrial fibrillation with RVR -- hx of the same, developed episode of RVR this morning. S/p IV metoprolol 2.5mg  x1. BPs were soft. Converted back to SR around 9am and has been sustaining since that time. Blood pressures have improved  -- Eliquis 5mg  BID has been resumed post op -- would continue Diltiazem 120mg  daily, can hold enalapril/Imdur if needed to allot for Diltiazem  Right brachial artery pseudoaneurysm s/p repair -- s/p viabahn stent, management per VVS with recommendations for ASA, plavix  -- monitor closely with the need for triple therapy  HFpEF Severe aortic stenosis -- does not appear volume overloaded on exam -- undergoing outpatient TAVR work up  Hypertension -- blood pressures soft this morning, but improved now that she is back in SR -- as above, would continue Diltiazem 120mg  daily, can hold ARB/Imdur if needed   CAD Elevated troponin -- cardiac cath 11/21 30% pLAD, 20% o/pRCA -- elevated troponin, demand ischemia in the setting of afib RVR -- on statin  Hyperlipidemia -- statin  Hypokalemia Hypomagnesemia -- K+ 3.4, Mag 1.6 -- supplement   Risk Assessment/Risk Scores:   CHA2DS2-VASc Score = 5  This indicates a 7.2% annual risk of stroke. The patient's score is based upon: CHF History: 0 HTN History: 1 Diabetes History: 0 Stroke History: 0 Vascular Disease History: 1 Age Score: 2 Gender Score: 1    For questions or updates, please contact [facility] Please consult [email or web address] for contact info under    Signed, Laverda Page, NP  10/25/2023 3:43 PM

## 2023-10-25 NOTE — Progress Notes (Signed)
While assisting patient to bedside commode, patient sitting on the side of the bed when her HR went into afib RVR.  Pt c/o increase SOB and CP. Oxygen increased to 3L  Notified Dr. Loney Loh. EKG done showing afib rvr. PO cardizem given per order. In addition, pt given 2.5 mg metoprolol. HR down. See doc flow sheet for pressures.   Will continue to monitor

## 2023-10-25 NOTE — Progress Notes (Addendum)
Progress Note    10/25/2023 8:59 AM 1 Day Post-Op  Subjective:  R arm feeling much better.  Edema has subjectively improved.   Vitals:   10/25/23 0430 10/25/23 0800  BP: 99/64 97/67  Pulse: (!) 126 (!) 142  Resp: 20   Temp:  97.6 F (36.4 C)  SpO2: 98% 99%   Physical Exam: Lungs:  non labored Incisions:  R radial cath site without hematoma; some fullness R groin cath site but no firm hematoma Extremities:  grip strength symmetrical; no dopplerable flow in R radial however ulnar and palmar arch with multiphasic flow by doppler; palpable DP pulses Neurologic: A&O  CBC    Component Value Date/Time   WBC 12.2 (H) 10/25/2023 0259   RBC 2.96 (L) 10/25/2023 0259   HGB 8.8 (L) 10/25/2023 0259   HGB 11.2 10/01/2023 1533   HCT 27.4 (L) 10/25/2023 0259   HCT 34.4 10/01/2023 1533   PLT 311 10/25/2023 0259   PLT 392 10/01/2023 1533   MCV 92.6 10/25/2023 0259   MCV 94 10/01/2023 1533   MCV 99 09/01/2014 1631   MCH 29.7 10/25/2023 0259   MCHC 32.1 10/25/2023 0259   RDW 13.6 10/25/2023 0259   RDW 12.6 10/01/2023 1533   RDW 14.1 09/01/2014 1631   LYMPHSABS 1.4 09/18/2023 0402   LYMPHSABS 2.8 09/01/2014 1631   MONOABS 0.8 09/18/2023 0402   MONOABS 0.7 09/01/2014 1631   EOSABS 0.2 09/18/2023 0402   EOSABS 0.1 09/01/2014 1631   BASOSABS 0.1 09/18/2023 0402   BASOSABS 0.1 09/01/2014 1631    BMET    Component Value Date/Time   NA 131 (L) 10/25/2023 0259   NA 137 10/13/2023 1033   NA 135 (L) 09/01/2014 1631   K 3.4 (L) 10/25/2023 0259   K 3.2 (L) 09/01/2014 1631   CL 96 (L) 10/25/2023 0259   CL 99 09/01/2014 1631   CO2 24 10/25/2023 0259   CO2 29 09/01/2014 1631   GLUCOSE 122 (H) 10/25/2023 0259   GLUCOSE 112 (H) 09/01/2014 1631   BUN 11 10/25/2023 0259   BUN 14 10/13/2023 1033   BUN 9 09/01/2014 1631   CREATININE 1.11 (H) 10/25/2023 0259   CREATININE 0.91 09/01/2014 1631   CALCIUM 8.8 (L) 10/25/2023 0259   CALCIUM 9.1 09/01/2014 1631   GFRNONAA 50 (L)  10/25/2023 0259   GFRNONAA >60 09/01/2014 1631   GFRNONAA >60 11/12/2012 1342   GFRAA >60 09/01/2014 1631   GFRAA >60 11/12/2012 1342    INR    Component Value Date/Time   INR 0.9 09/10/2020 1620     Intake/Output Summary (Last 24 hours) at 10/25/2023 0859 Last data filed at 10/25/2023 0020 Gross per 24 hour  Intake 245.55 ml  Output 0 ml  Net 245.55 ml     Assessment/Plan:  82 y.o. female is s/p endovascular repair of R brachial artery pseudoaneurysm 1 Day Post-Op   Subjectively R arm edema and discomfort have improved R hand well perfused with multiphasic flow in ulnar and palmar arch despite no dopplerable flow in radial distal to cath site; no indication for further intervention R groin cath site with fullness but no firm hematoma; palpable DP pulse Continue aspirin, plavix, statin Vascular will sign off; office will arrange RUE arterial duplex in 4-6 weeks    Emilie Rutter, PA-C [facility] [phone] 10/25/2023 8:59 AM  VASCULAR STAFF ADDENDUM: I have independently interviewed and examined the patient. I agree with the above.  Recommend arm sleeve v Ace  for some light compression.   Victorino Sparrow MD [facility] Phone Number: [phone] 10/25/2023 10:04 AM

## 2023-10-25 NOTE — Progress Notes (Signed)
Patient HR sustaining 142-143. Paged Dr. Loney Loh. Awaiting call back.

## 2023-10-25 NOTE — Care Management Obs Status (Signed)
MEDICARE OBSERVATION STATUS NOTIFICATION   Patient Details  Name: Linda Brady MRN: [id number] Date of Birth: Sep 18, 1941   Medicare Observation Status Notification Given:  Yes    Ronny Bacon, RN 10/25/2023, 6:48 AM

## 2023-10-26 DIAGNOSIS — I7776 Dissection of artery of upper extremity: Secondary | ICD-10-CM | POA: Diagnosis not present

## 2023-10-26 LAB — CBC
HCT: 29 % — ABNORMAL LOW (ref 36.0–46.0)
Hemoglobin: 9.5 g/dL — ABNORMAL LOW (ref 12.0–15.0)
MCH: 30.1 pg (ref 26.0–34.0)
MCHC: 32.8 g/dL (ref 30.0–36.0)
MCV: 91.8 fL (ref 80.0–100.0)
Platelets: 231 10*3/uL (ref 150–400)
RBC: 3.16 MIL/uL — ABNORMAL LOW (ref 3.87–5.11)
RDW: 13.6 % (ref 11.5–15.5)
WBC: 12.5 10*3/uL — ABNORMAL HIGH (ref 4.0–10.5)
nRBC: 0 % (ref 0.0–0.2)

## 2023-10-26 NOTE — Progress Notes (Signed)
  Daily Progress Note  S/p: Brachial artery stenting for pseudoaneurysm  Subjective: Doing well this morning, no complaints Imaging overnight for what appeared to be increased edema-negative for continued bleeding  Objective: Vitals:   10/26/23 0828 10/26/23 1010  BP:  92/66  Pulse: 84 89  Resp: 20 18  Temp:    SpO2:  100%    Physical Examination Nonpalpable radial, excellent ulnar signal, palmar arch signal, digital signals in the right hand Nonlabored breathing No motor or sensory deficits in the right hand Wrap for light compression  ASSESSMENT/PLAN:  Patient is status post right brachial artery stenting.  She has been taking off her Eliquis for the time being with plans to restart in 30 days.  Continue aspirin Plavix.  Aspirin Plavix has been found to improve stent patency when used for 30 days.  After 30 days, Plavix can be discontinued and Eliquis restarted.  This obviously comes with a small risk of stroke, however this risk is low with the medication only being held for 30 days.   Victorino Sparrow MD MS [facility] [phone] 10/26/2023  10:29 AM

## 2023-10-26 NOTE — Plan of Care (Signed)
Problem: Cardiovascular: Goal: Vascular access site(s) Level 0-1 will be maintained Outcome: Progressing

## 2023-10-26 NOTE — Plan of Care (Signed)
  Problem: Activity: Goal: Ability to return to baseline activity level will improve Outcome: Not Progressing   Problem: Cardiovascular: Goal: Vascular access site(s) Level 0-1 will be maintained Outcome: Not Progressing

## 2023-10-26 NOTE — Progress Notes (Signed)
PROGRESS NOTE    Linda Brady  UYQ:[id number] DOB: [birth year] DOA: 10/23/2023 PCP: [name], MD   Brief Narrative:  This 82 y.o. female with medical history significant of CAD, COPD, hypertension, dyslipidemia, GERD, hyponatremia, HFpEF, aortic stenosis, paroxysmal atrial fibrillation with RVR, hypothyroidism, restless leg syndrome, and anxiety/depression. She had  diagnostic right and left heart cath with right IJ and right radial approach.  Post-procedure she experienced (R) arm pain, manual pressure was  held by nursing staff, and she was evaluated in PACU, at that time she did not have a hematoma on exam. She was instructed to hold Eliquis until Monday and discharged home.  Last night she presented to [facility] Emergency Department with ongoing right upper extremity pain and bruising. CTA of right upper extremity obtained and shows focal area of dissection in the proximal brachial artery, there is also a pseudoaneurysm in the medial aspect of the right upper arm.  Vascular surgery was consulted.  Patient admitted for further evaluation.  Assessment & Plan:   Principal Problem:   Dissection of right brachial artery (HCC) Active Problems:   Essential hypertension   Anxiety   GERD (gastroesophageal reflux disease)   Hyponatremia   Hypothyroidism   Chronic heart failure with preserved ejection fraction (HFpEF) (HCC)   Aortic stenosis, severe   COPD (chronic obstructive pulmonary disease) (HCC)   Dyslipidemia   Afib (HCC)   Restless leg syndrome  Brachial artery dissection: CTA right arm with focal area of dissection in the proximal brachial artery, there is also pseudoaneurysm in the medial aspect of the right upper arm. Vascular surgery consulted.  Patient underwent endovascular repair of the right axillary artery pseudoaneurysm. Nursing neurovascular checks Follow-up vascular surgery. Eliquis kept on hold. Eliquis to be resumed in 30 days. Continue aspirin and plavix    Atrial Fibrillation: Heart rate remains uncontrolled resulting CHF and hypotension with severe AS. Cardiology started amiodarone 200 mg p.o. twice daily until her AS is managed. Cardizem was discontinued.   HFpEF: Severe Aortic Stenosis: LVEF 65 to 70% in October 2024. Hold home hydrochlorothiazide in setting of hyponatremia Continue home Lasix . She is undergoing evaluation for TAVR.   COPD: Not in acute exacerbation. Continue home Symbicort PRN Albuterol nebulizer Continue home supplemental O2 PRN   Hyponatremia: Hold home Hydrochlorothiazide. Monitor sodium level.   Hypertension: Hold Vasotec, Imdur,  BP on soft side. Hold home hydrochlorothiazide.   CAD Hyperlipidemia: Continue home atorvastatin.   Hypothyroidism Continue levothyroxine.   Restless Leg Syndrome Continue Requip   GERD: Continue Protonix.   Generalized Anxiety Disorder: Continue home Prozac. Continue PRN qHS Xanax.   DVT prophylaxis: SCDs Code Status: Full code Family Communication: No family at bed side Disposition Plan:   Status is: Inpatient Remains inpatient appropriate because:     Admitted for pseudoaneurysm,  vascular surgery is consulted, underwent endovascular repair of the axillary artery pseudoaneurysm   Consultants:  Vascular surgery  Procedures:   Antimicrobials: Anti-infectives (From admission, onward)    None      Subjective: Patient was seen and examined at bedside.  Overnight events noted.   Patient reports doing better.  Status post aneurysmal repair. POD 2 Last night HR uncontrolled, stated on amiodarone, cardizem discontinued.  Objective: Vitals:   10/26/23 0812 10/26/23 0828 10/26/23 1010 10/26/23 1156  BP: (!) 127/54  92/66 (!) 104/51  Pulse: 83 84 89 87  Resp: (!) 21 20 18 20   Temp: 97.7 F (36.5 C)   98 F (36.7 C)  TempSrc: Oral   Oral  SpO2: 99%  100% 100%    Intake/Output Summary (Last 24 hours) at 10/26/2023 1400 Last data filed at  10/26/2023 1343 Gross per 24 hour  Intake 0 ml  Output 1400 ml  Net -1400 ml   There were no vitals filed for this visit.  Examination:  General exam: Appears comfortable,  deconditioned, not in any acute distress. Respiratory system: CTA bilaterally. Respiratory effort normal.  RR 14 Cardiovascular system: S1 & S2 heard, RRR. No JVD, murmurs, rubs, gallops or clicks.  Gastrointestinal system: Abdomen is non distended, soft and non tender. Normal bowel sounds heard. Central nervous system: Alert and oriented x 3. No focal neurological deficits. Extremities: No edema, no cyanosis, no clubbing, RUE Skin: No rashes, lesions or ulcers Psychiatry: Judgement and insight appear normal. Mood & affect appropriate.     Data Reviewed: I have personally reviewed following labs and imaging studies  CBC: Recent Labs  Lab 10/23/23 1131 10/23/23 2152 10/24/23 0312 10/25/23 0259 10/26/23 0358  WBC  --  12.1*  --  12.2* 12.5*  HGB 10.5* 10.4* 10.2* 8.8* 9.5*  HCT 31.0* 31.8* 31.6* 27.4* 29.0*  MCV  --  92.7  --  92.6 91.8  PLT  --  382  --  311 231   Basic Metabolic Panel: Recent Labs  Lab 10/23/23 1127 10/23/23 1130 10/23/23 1131 10/23/23 2152 10/25/23 0259  NA 137 137 139 130* 131*  K 3.3* 3.3* 3.2* 3.6 3.4*  CL  --   --   --  97* 96*  CO2  --   --   --  24 24  GLUCOSE  --   --   --  131* 122*  BUN  --   --   --  9 11  CREATININE  --   --   --  0.88 1.11*  CALCIUM  --   --   --  9.2 8.8*  MG  --   --   --   --  1.6*   GFR: Estimated Creatinine Clearance: 41 mL/min (A) (by C-G formula based on SCr of 1.11 mg/dL (H)). Liver Function Tests: No results for input(s): "AST", "ALT", "ALKPHOS", "BILITOT", "PROT", "ALBUMIN" in the last 168 hours. No results for input(s): "LIPASE", "AMYLASE" in the last 168 hours. No results for input(s): "AMMONIA" in the last 168 hours. Coagulation Profile: No results for input(s): "INR", "PROTIME" in the last 168 hours. Cardiac Enzymes: No  results for input(s): "CKTOTAL", "CKMB", "CKMBINDEX", "TROPONINI" in the last 168 hours. BNP (last 3 results) Recent Labs    10/15/23 1521  PROBNP 533   HbA1C: No results for input(s): "HGBA1C" in the last 72 hours. CBG: Recent Labs  Lab 10/24/23 2116 10/25/23 0603  GLUCAP 117* 107*   Lipid Profile: No results for input(s): "CHOL", "HDL", "LDLCALC", "TRIG", "CHOLHDL", "LDLDIRECT" in the last 72 hours. Thyroid Function Tests: No results for input(s): "TSH", "T4TOTAL", "FREET4", "T3FREE", "THYROIDAB" in the last 72 hours. Anemia Panel: No results for input(s): "VITAMINB12", "FOLATE", "FERRITIN", "TIBC", "IRON", "RETICCTPCT" in the last 72 hours. Sepsis Labs: No results for input(s): "PROCALCITON", "LATICACIDVEN" in the last 168 hours.  No results found for this or any previous visit (from the past 240 hour(s)).   Radiology Studies: CT ANGIO UP EXTREM RIGHT W &/OR WO CONTRAST  Result Date: 10/26/2023 CLINICAL DATA:  Upper extremity dissection. EXAM: CT ANGIOGRAPHY UPPER RIGHT EXTREMITY TECHNIQUE: Axial CT images of the right upper extremity during arterial phase imaging. Coronal and  sagittal reformats were submitted as well as MIP reformats. CONTRAST:  OMNIPAQUE IOHEXOL 350 MG/ML SOLN COMPARISON:  Right upper extremity CT angiogram 10/23/2023. FINDINGS: There has been interval placement of a stent within the proximal right brachial artery. The right subclavian, axillary and brachial arteries appear patent. There is no evidence for significant stenosis, dissection or aneurysm. The proximal 1/3 of the radial artery is patent, but middle and distal radial artery is not opacified. The ulnar artery and interosseous arteries are well opacified the level of the wrist. There is subcutaneous edema of the anterior elbow and proximal arm. There some heterogeneous intramuscular edema and hemorrhage anterior to the brachial artery similar to the prior study. Review of the MIP images confirms the  above findings. IMPRESSION: 1. Interval placement of a stent within the proximal right brachial artery. Stent is patent. 2. The proximal 1/3 of the radial artery is patent, but the middle and distal radial artery is not opacified. The ulnar artery and interosseous arteries are well opacified the level of the wrist. 3. Heterogeneous intramuscular edema and hemorrhage anterior to the brachial artery similar to the prior study. Electronically Signed   By: Darliss Cheney M.D.   On: 10/26/2023 00:19   PERIPHERAL VASCULAR CATHETERIZATION  Result Date: 10/24/2023 DATE OF SERVICE: 10/24/2023  PATIENT:  Linda Brady  82 y.o. female  PRE-OPERATIVE DIAGNOSIS:  right axillary artery pseudoaneurysm  POST-OPERATIVE DIAGNOSIS:  Same  PROCEDURE:  1) Ultrasound guided right common femoral artery access 2) Arch aortogram 3) Innominate angiogram 4) Right upper extremity angiogram 5) Ultrasound guided right radial artery access 6) Endovascular repair of right axillary artery pseudoaneurysm (7x54mm Viabahn) 7) Conscious sedation (99 minutes)  SURGEON:  Rande Brunt. Lenell Antu, MD  ASSISTANT: none  ANESTHESIA:   local and IV sedation  ESTIMATED BLOOD LOSS: minimal  LOCAL MEDICATIONS USED:  LIDOCAINE  COUNTS: confirmed correct.  PATIENT DISPOSITION:  PACU - hemodynamically stable.  Delay start of Pharmacological VTE agent (>24hrs) due to surgical blood loss or risk of bleeding: no  INDICATION FOR PROCEDURE: HELINA BULLARD is a 82 y.o. 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 After careful discussion of risks, benefits, and alternatives the patient was offered endovascular repair. The patient understood and wished to proceed.  OPERATIVE FINDINGS: Type III arch with highly angulated right subclavian artery Ultimately able to cross lesion by accessing right radial artery and snaring wire Pseudoaneurysm successfully treated by covered angioplasty and stenting  DESCRIPTION OF PROCEDURE: After identification  of the patient in the pre-operative holding area, the patient was transferred to the operating room. The patient was positioned supine on the operating room table. Anesthesia was induced. The groins was prepped and draped in standard fashion. A surgical pause was performed confirming correct patient, procedure, and operative location.  The right groin was anesthetized with subcutaneous injection of 1% lidocaine. Using ultrasound guidance, the right common femoral artery was accessed with micropuncture technique. Fluoroscopy was used to confirm cannulation over the femoral head. The 70F micropuncture sheath was upsized to 49F.  The patient was systemically heparinized.  A Glidewire advantage was navigated into the ascending aorta.  A pigtail catheter was advanced into the descending aorta.  Arch angiogram was performed.  Multiple catheter and wire exchanges were used to try to cannulate the innominate artery.  This was very challenging given the highly angulated nature of the aortic arch.  We changed our projection into an extreme left anterior oblique and caudal projection or able to eliminate some  of the parallax in her arch.  I was able to select the innominate artery, but I was not able to deliver a wire retrograde into the axillary artery without it "kicking out".  The right radial artery was accessed with micropuncture technique.  A 4/5 French slender sheath was introduced into the radial artery after singlewall access.  A J-wire was navigated across the area of pseudoaneurysm atraumatically under fluoroscopic guidance.  The wire was advanced into the ascending aorta.  Over the wire a catheter was advanced into the ascending aorta.  A loop snare was advanced into the ascending aorta.  Using a combination of a barrier to catheter and Glidewire I was able to engage the loop snare from the femoral access and externalized the wire from the radial access.  With this system built, I was able to deliver a 7 [place] by 90  cm sheath into the right upper extremity subclavian artery.  Selective angiography was performed of the right upper extremity.  The pseudoaneurysm identified and marked on the screen.  Her wire was exchanged for a V18 wire.  A 7 x 50 mm Viabahn stent was positioned across the pseudoaneurysm to avoid covering the profunda arteries.  The stent was deployed in standard fashion with good technical result.  The stent was placed angioplastied.  Per follow-up angiogram showed resolution of the pseudoaneurysm.  Satisfied we ended the case here.  All endovascular equipment was removed.  I attempted to use a Perclose device to close the groin.  We lost wire access and manual pressure was held.  Conscious sedation was administered with the use of IV fentanyl and midazolam under continuous physician and nurse monitoring.  Heart rate, blood pressure, and oxygen saturation were continuously monitored.  Total sedation time was 99 minutes  Upon completion of the case instrument and sharps counts were confirmed correct. The patient was transferred to the PACU in good condition. I was present for all portions of the procedure.  PLAN: ASA 81mg  PO every day. Plavix 75mg  PO every day. High intensity statin therapy.  Rande Brunt. Lenell Antu, MD FACS [facility] Phone Number: [phone] 10/24/2023 5:07 PM     Scheduled Meds:  amiodarone  200 mg Oral BID   aspirin EC  81 mg Oral Daily   atorvastatin  40 mg Oral Daily   clopidogrel  75 mg Oral Daily   enalapril  20 mg Oral BID   fluticasone furoate-vilanterol  1 puff Inhalation Daily   furosemide  20 mg Oral BID   isosorbide mononitrate  30 mg Oral Daily   levothyroxine  75 mcg Oral Daily   pantoprazole  40 mg Oral BID   rOPINIRole  0.25 mg Oral QHS   sodium chloride flush  3 mL Intravenous Q12H   Continuous Infusions:     LOS: 1 day    Time spent: 35 mins    Willeen Niece, MD [facility]   If 7PM-7AM, please  contact night-coverage

## 2023-10-26 NOTE — Plan of Care (Signed)
Problem: Activity: Goal: Ability to return to baseline activity level will improve Outcome: Not Progressing

## 2023-10-26 NOTE — Progress Notes (Signed)
Notified by CCMD that patient was having flutter. Upon looking at the monitor, couple beats of flutter intermittently.  Patient asymptomatic. Will continue to monitor

## 2023-10-26 NOTE — Progress Notes (Addendum)
Notified cardiology about pt's HR 130-140. Pt very anxious. Order given for hydroxyzine.  Verbal given to give 2.5 mg metoprolol.

## 2023-10-26 NOTE — Progress Notes (Signed)
Progress Note  Patient Name: EMARIA SCHEIB Date of Encounter: 10/26/2023  Primary Cardiologist: Debbe Odea, MD   Subjective   Feels much better today.  Inpatient Medications    Scheduled Meds:  amiodarone  200 mg Oral BID   aspirin EC  81 mg Oral Daily   atorvastatin  40 mg Oral Daily   clopidogrel  75 mg Oral Daily   enalapril  20 mg Oral BID   fluticasone furoate-vilanterol  1 puff Inhalation Daily   furosemide  20 mg Oral BID   isosorbide mononitrate  30 mg Oral Daily   levothyroxine  75 mcg Oral Daily   pantoprazole  40 mg Oral BID   rOPINIRole  0.25 mg Oral QHS   sodium chloride flush  3 mL Intravenous Q12H   Continuous Infusions:  PRN Meds: acetaminophen, ALPRAZolam, FLUoxetine, hydrALAZINE, hydrOXYzine, levalbuterol, morphine injection, ondansetron **OR** ondansetron (ZOFRAN) IV, sodium chloride flush   Vital Signs    Vitals:   10/26/23 0812 10/26/23 0828 10/26/23 1010 10/26/23 1156  BP: (!) 127/54  92/66 (!) 104/51  Pulse: 83 84 89 87  Resp: (!) 21 20 18 20   Temp: 97.7 F (36.5 C)   98 F (36.7 C)  TempSrc: Oral   Oral  SpO2: 99%  100% 100%    Intake/Output Summary (Last 24 hours) at 10/26/2023 1223 Last data filed at 10/26/2023 1030 Gross per 24 hour  Intake 0 ml  Output 850 ml  Net -850 ml   There were no vitals filed for this visit.  Telemetry    Sinus rhythm. Brief episode of atrial flutter overnight.  - Personally Reviewed  ECG    11/23 AM AF with RVR - Personally Reviewed  Physical Exam   GEN: No acute distress.   Neck: No JVD Cardiac: RRR, no murmurs, rubs, or gallops.  Respiratory: Clear to auscultation bilaterally. GI: Soft, nontender, non-distended  MS: No edema; No deformity. Neuro:  Nonfocal  Psych: Normal affect   Labs    Chemistry Recent Labs  Lab 10/23/23 1131 10/23/23 2152 10/25/23 0259  NA 139 130* 131*  K 3.2* 3.6 3.4*  CL  --  97* 96*  CO2  --  24 24  GLUCOSE  --  131* 122*  BUN  --  9 11   CREATININE  --  0.88 1.11*  CALCIUM  --  9.2 8.8*  GFRNONAA  --  >60 50*  ANIONGAP  --  9 11     Hematology Recent Labs  Lab 10/23/23 2152 10/24/23 0312 10/25/23 0259 10/26/23 0358  WBC 12.1*  --  12.2* 12.5*  RBC 3.43*  --  2.96* 3.16*  HGB 10.4* 10.2* 8.8* 9.5*  HCT 31.8* 31.6* 27.4* 29.0*  MCV 92.7  --  92.6 91.8  MCH 30.3  --  29.7 30.1  MCHC 32.7  --  32.1 32.8  RDW 13.8  --  13.6 13.6  PLT 382  --  311 231    Cardiac EnzymesNo results for input(s): "TROPONINI" in the last 168 hours. No results for input(s): "TROPIPOC" in the last 168 hours.   BNPNo results for input(s): "BNP", "PROBNP" in the last 168 hours.   DDimer No results for input(s): "DDIMER" in the last 168 hours.   Summary of Pertinent studies    TTE: 09/19/2023: EF 65-70%   Patient Profile     82 y.o. female with a hx of nonobstructive CAD, COPD, hypertension, hypothyroidism, restless leg syndrome, anxiety, depression, aortic stenosis, atrial fibrillation  s/p repair of a right arm pseudoaneurysm after recent cath.  She is being seen for the evaluation of atrial fibrillation at the request of Dr. Idelle Leech   Assessment & Plan    Atrial fibrillation with RVR Pt has history of PAF -- recently diagnosed Rates in AF are rapid, resulting in CHF and hypotension with severe AS I recommend starting amiodarone 200mg  PO BID until her AS is managed Can DC diltiazem with initiation of amiodarone   Severe aortic stenosis Diagnosed 09/2023 Undergoing evaluation for TAVR   Brachial artery pseudoaneurysm S/p endovascular repair 11/22  HFpEF Appears relatively euvolemic Severe AS likely primary contributor  CAD Elevated troponin due to demand ischemia in the setting of AF with RVR cardiac cath 11/21 30% pLAD, 20% o/pRCA  On statin  For questions or updates, please contact [facility] Please consult [email or web address] for contact info under Cardiology/STEMI.      Signed, Maurice Small,  MD 10/26/2023, 12:23 PM

## 2023-10-26 NOTE — Progress Notes (Signed)
Patient's arm wrapped with ace wrap per vascular

## 2023-10-27 ENCOUNTER — Encounter (HOSPITAL_COMMUNITY): Payer: Self-pay | Admitting: Vascular Surgery

## 2023-10-27 ENCOUNTER — Other Ambulatory Visit: Payer: Self-pay | Admitting: Physician Assistant

## 2023-10-27 ENCOUNTER — Inpatient Hospital Stay (HOSPITAL_COMMUNITY): Payer: Medicare Other

## 2023-10-27 ENCOUNTER — Ambulatory Visit (HOSPITAL_COMMUNITY): Admission: RE | Admit: 2023-10-27 | Payer: Medicare Other | Source: Ambulatory Visit

## 2023-10-27 DIAGNOSIS — I2583 Coronary atherosclerosis due to lipid rich plaque: Secondary | ICD-10-CM

## 2023-10-27 DIAGNOSIS — E7800 Pure hypercholesterolemia, unspecified: Secondary | ICD-10-CM

## 2023-10-27 DIAGNOSIS — I10 Essential (primary) hypertension: Secondary | ICD-10-CM

## 2023-10-27 DIAGNOSIS — I480 Paroxysmal atrial fibrillation: Secondary | ICD-10-CM

## 2023-10-27 DIAGNOSIS — I350 Nonrheumatic aortic (valve) stenosis: Secondary | ICD-10-CM

## 2023-10-27 DIAGNOSIS — I2510 Atherosclerotic heart disease of native coronary artery without angina pectoris: Secondary | ICD-10-CM | POA: Diagnosis not present

## 2023-10-27 DIAGNOSIS — I5032 Chronic diastolic (congestive) heart failure: Secondary | ICD-10-CM

## 2023-10-27 DIAGNOSIS — I7776 Dissection of artery of upper extremity: Secondary | ICD-10-CM | POA: Diagnosis not present

## 2023-10-27 DIAGNOSIS — I4891 Unspecified atrial fibrillation: Secondary | ICD-10-CM | POA: Diagnosis not present

## 2023-10-27 LAB — BASIC METABOLIC PANEL
Anion gap: 6 (ref 5–15)
BUN: 11 mg/dL (ref 8–23)
CO2: 26 mmol/L (ref 22–32)
Calcium: 8.4 mg/dL — ABNORMAL LOW (ref 8.9–10.3)
Chloride: 98 mmol/L (ref 98–111)
Creatinine, Ser: 1.04 mg/dL — ABNORMAL HIGH (ref 0.44–1.00)
GFR, Estimated: 54 mL/min — ABNORMAL LOW (ref >60)
Glucose, Bld: 105 mg/dL — ABNORMAL HIGH (ref 70–99)
Potassium: 3.5 mmol/L (ref 3.5–5.1)
Sodium: 130 mmol/L — ABNORMAL LOW (ref 135–145)

## 2023-10-27 LAB — CBC
HCT: 23.9 % — ABNORMAL LOW (ref 36.0–46.0)
Hemoglobin: 7.6 g/dL — ABNORMAL LOW (ref 12.0–15.0)
MCH: 29.6 pg (ref 26.0–34.0)
MCHC: 31.8 g/dL (ref 30.0–36.0)
MCV: 93 fL (ref 80.0–100.0)
Platelets: 304 10*3/uL (ref 150–400)
RBC: 2.57 MIL/uL — ABNORMAL LOW (ref 3.87–5.11)
RDW: 13.7 % (ref 11.5–15.5)
WBC: 8.2 10*3/uL (ref 4.0–10.5)
nRBC: 0 % (ref 0.0–0.2)

## 2023-10-27 LAB — MAGNESIUM: Magnesium: 1.9 mg/dL (ref 1.7–2.4)

## 2023-10-27 LAB — PHOSPHORUS: Phosphorus: 3 mg/dL (ref 2.5–4.6)

## 2023-10-27 MED ORDER — METOPROLOL TARTRATE 25 MG PO TABS
25.0000 mg | ORAL_TABLET | ORAL | Status: DC
  Administered 2023-10-28: 25 mg via ORAL
  Filled 2023-10-27: qty 1

## 2023-10-27 NOTE — Progress Notes (Signed)
Mobility Specialist Progress Note:   10/27/23 1047  Mobility  Activity Ambulated with assistance in hallway  Level of Assistance Contact guard assist, steadying assist  Assistive Device None  Distance Ambulated (ft) 80 ft  Activity Response Tolerated well  Mobility Referral Yes  $Mobility charge 1 Mobility  Mobility Specialist Start Time (ACUTE ONLY) 1020  Mobility Specialist Stop Time (ACUTE ONLY) 1035  Mobility Specialist Time Calculation (min) (ACUTE ONLY) 15 min   Pre Mobility: 90 HR , 95% SpO2 RA During Mobility: 110 HR , 90% SpO2 RA Post Mobility: 97 HR ,  94% SpO2 RA  Pt received in bed, agreeable to mobility. SB bed mobility. CG to stand and ambulate. Pt c/o leg weakness upon standing resulting in wobbly gait during ambulation but no LOB present. Pt displayed greater stability towards EOS. Pt c/o slight SOB, otherwise asx throughout. Pt left in chair with call bell in reach and all needs met. RN notified.   Leory Plowman  Mobility Specialist Please contact via [employer] office at [phone]

## 2023-10-27 NOTE — Progress Notes (Signed)
Mobility Specialist Progress Note:   10/27/23 1238  Mobility  Activity Transferred from chair to bed  Level of Assistance Contact guard assist, steadying assist  Assistive Device  (HHA)  Distance Ambulated (ft) 7 ft  Activity Response Tolerated well  Mobility Referral Yes  $Mobility charge 1 Mobility  Mobility Specialist Start Time (ACUTE ONLY) 1155  Mobility Specialist Stop Time (ACUTE ONLY) 1200  Mobility Specialist Time Calculation (min) (ACUTE ONLY) 5 min   Pt received in chair, requesting to return back to bed. Pt able to stand and ambulate around bed with HHA. Pt c/o purewick malfunctions. NT notified. Pt left in bed with call bell in reach and all needs met.   Leory Plowman  Mobility Specialist Please contact via [employer] office at [phone]

## 2023-10-27 NOTE — Progress Notes (Signed)
PROGRESS NOTE    Linda Brady  MWN:[id number] DOB: [birth year] DOA: 10/23/2023 PCP: [name], MD   Brief Narrative:  This 82 y.o. female with medical history significant of CAD, COPD, hypertension, dyslipidemia, GERD, hyponatremia, HFpEF, aortic stenosis, paroxysmal atrial fibrillation with RVR, hypothyroidism, restless leg syndrome, and anxiety/depression. She had  diagnostic right and left heart cath with right IJ and right radial approach.  Post-procedure she experienced (R) arm pain, manual pressure was  held by nursing staff, and she was evaluated in PACU, at that time she did not have a hematoma on exam. She was instructed to hold Eliquis until Monday and discharged home.  Last night she presented to [facility] Emergency Department with ongoing right upper extremity pain and bruising. CTA of right upper extremity obtained and shows focal area of dissection in the proximal brachial artery, there is also a pseudoaneurysm in the medial aspect of the right upper arm.  Vascular surgery was consulted.  Patient admitted for further evaluation.  Assessment & Plan:   Principal Problem:   Dissection of right brachial artery (HCC) Active Problems:   Essential hypertension   Anxiety   GERD (gastroesophageal reflux disease)   Hyponatremia   Hypothyroidism   Chronic heart failure with preserved ejection fraction (HFpEF) (HCC)   Aortic stenosis, severe   COPD (chronic obstructive pulmonary disease) (HCC)   Dyslipidemia   Afib (HCC)   Restless leg syndrome  Brachial artery dissection: CTA right arm with focal area of dissection in the proximal brachial artery, there is also pseudoaneurysm in the medial aspect of the right upper arm. Vascular surgery consulted.  Patient underwent endovascular repair of the right axillary artery pseudoaneurysm. Nursing neurovascular checks Follow-up vascular surgery. Eliquis kept on hold. Eliquis to be resumed in 30 days. Continue aspirin and plavix    Atrial Fibrillation: Heart rate remains uncontrolled resulting CHF and hypotension with severe AS. Cardiology started amiodarone 200 mg p.o. twice daily until her AS is managed. Cardizem was discontinued.   HFpEF: Severe Aortic Stenosis: LVEF 65 to 70% in October 2024. Hold home hydrochlorothiazide in setting of hyponatremia Continue home Lasix . She is undergoing evaluation for TAVR. She is getting CT chest abdomen and pelvis, further workup as an outpatient   COPD: Not in acute exacerbation. Continue home Symbicort, PRN Albuterol nebulizer Continue home supplemental O2 PRN   Hyponatremia: Hold home Hydrochlorothiazide. Monitor sodium level.   Hypertension: Hold Vasotec, Imdur,  BP on soft side. Hold home hydrochlorothiazide.   CAD Hyperlipidemia: Continue home atorvastatin.   Hypothyroidism: Continue levothyroxine.   Restless Leg Syndrome: Continue Requip   GERD: Continue Protonix.   Generalized Anxiety Disorder: Continue home Prozac. Continue PRN qHS Xanax.   DVT prophylaxis: SCDs Code Status: Full code Family Communication: No family at bed side Disposition Plan:   Status is: Inpatient Remains inpatient appropriate because:     Admitted for pseudoaneurysm,  vascular surgery is consulted, underwent endovascular repair of the axillary artery pseudoaneurysm. Is going to CT chest abdomen and pelvis as a workup for evaluation for TAVR   Consultants:  Vascular surgery  Procedures:   Antimicrobials: Anti-infectives (From admission, onward)    None      Subjective: Patient was seen and examined at bedside.  Overnight events noted.   Patient reports doing better.  Status post aneurysmal repair. POD 3 Patient reports feeling better, denies any chest pain.  Objective: Vitals:   10/27/23 0827 10/27/23 0828 10/27/23 0914 10/27/23 1208  BP: 97/62 97/62 (!) 115/53 119/61  Pulse: 88 87 78   Resp: (!) 28 20 20    Temp: 97.6 F (36.4 C) 97.6 F  (36.4 C)  97.9 F (36.6 C)  TempSrc: Oral   Oral  SpO2: 99% 99% 99%     Intake/Output Summary (Last 24 hours) at 10/27/2023 1421 Last data filed at 10/27/2023 1108 Gross per 24 hour  Intake --  Output 1400 ml  Net -1400 ml   There were no vitals filed for this visit.  Examination:  General exam: Appears comfortable,  deconditioned, not in any acute distress. Respiratory system: CTA bilaterally. Respiratory effort normal.  RR 14 Cardiovascular system: S1 & S2 heard, RRR. No JVD, murmurs, rubs, gallops or clicks.  Gastrointestinal system: Abdomen is non distended, soft and non tender. Normal bowel sounds heard. Central nervous system: Alert and oriented x 3. No focal neurological deficits. Extremities: No edema, no cyanosis, no clubbing, RUE wrapped in dressing Skin: No rashes, lesions or ulcers Psychiatry: Judgement and insight appear normal. Mood & affect appropriate.     Data Reviewed: I have personally reviewed following labs and imaging studies  CBC: Recent Labs  Lab 10/23/23 2152 10/24/23 0312 10/25/23 0259 10/26/23 0358 10/27/23 0413  WBC 12.1*  --  12.2* 12.5* 8.2  HGB 10.4* 10.2* 8.8* 9.5* 7.6*  HCT 31.8* 31.6* 27.4* 29.0* 23.9*  MCV 92.7  --  92.6 91.8 93.0  PLT 382  --  311 231 304   Basic Metabolic Panel: Recent Labs  Lab 10/23/23 1130 10/23/23 1131 10/23/23 2152 10/25/23 0259 10/27/23 0413  NA 137 139 130* 131* 130*  K 3.3* 3.2* 3.6 3.4* 3.5  CL  --   --  97* 96* 98  CO2  --   --  24 24 26   GLUCOSE  --   --  131* 122* 105*  BUN  --   --  9 11 11   CREATININE  --   --  0.88 1.11* 1.04*  CALCIUM  --   --  9.2 8.8* 8.4*  MG  --   --   --  1.6* 1.9  PHOS  --   --   --   --  3.0   GFR: Estimated Creatinine Clearance: 43.8 mL/min (A) (by C-G formula based on SCr of 1.04 mg/dL (H)). Liver Function Tests: No results for input(s): "AST", "ALT", "ALKPHOS", "BILITOT", "PROT", "ALBUMIN" in the last 168 hours. No results for input(s): "LIPASE",  "AMYLASE" in the last 168 hours. No results for input(s): "AMMONIA" in the last 168 hours. Coagulation Profile: No results for input(s): "INR", "PROTIME" in the last 168 hours. Cardiac Enzymes: No results for input(s): "CKTOTAL", "CKMB", "CKMBINDEX", "TROPONINI" in the last 168 hours. BNP (last 3 results) Recent Labs    10/15/23 1521  PROBNP 533   HbA1C: No results for input(s): "HGBA1C" in the last 72 hours. CBG: Recent Labs  Lab 10/24/23 2116 10/25/23 0603  GLUCAP 117* 107*   Lipid Profile: No results for input(s): "CHOL", "HDL", "LDLCALC", "TRIG", "CHOLHDL", "LDLDIRECT" in the last 72 hours. Thyroid Function Tests: No results for input(s): "TSH", "T4TOTAL", "FREET4", "T3FREE", "THYROIDAB" in the last 72 hours. Anemia Panel: No results for input(s): "VITAMINB12", "FOLATE", "FERRITIN", "TIBC", "IRON", "RETICCTPCT" in the last 72 hours. Sepsis Labs: No results for input(s): "PROCALCITON", "LATICACIDVEN" in the last 168 hours.  No results found for this or any previous visit (from the past 240 hour(s)).   Radiology Studies: CT ANGIO UP EXTREM RIGHT W &/OR WO CONTRAST  Result Date: 10/26/2023 CLINICAL DATA:  Upper extremity dissection. EXAM: CT ANGIOGRAPHY UPPER RIGHT EXTREMITY TECHNIQUE: Axial CT images of the right upper extremity during arterial phase imaging. Coronal and sagittal reformats were submitted as well as MIP reformats. CONTRAST:  OMNIPAQUE IOHEXOL 350 MG/ML SOLN COMPARISON:  Right upper extremity CT angiogram 10/23/2023. FINDINGS: There has been interval placement of a stent within the proximal right brachial artery. The right subclavian, axillary and brachial arteries appear patent. There is no evidence for significant stenosis, dissection or aneurysm. The proximal 1/3 of the radial artery is patent, but middle and distal radial artery is not opacified. The ulnar artery and interosseous arteries are well opacified the level of the wrist. There is subcutaneous  edema of the anterior elbow and proximal arm. There some heterogeneous intramuscular edema and hemorrhage anterior to the brachial artery similar to the prior study. Review of the MIP images confirms the above findings. IMPRESSION: 1. Interval placement of a stent within the proximal right brachial artery. Stent is patent. 2. The proximal 1/3 of the radial artery is patent, but the middle and distal radial artery is not opacified. The ulnar artery and interosseous arteries are well opacified the level of the wrist. 3. Heterogeneous intramuscular edema and hemorrhage anterior to the brachial artery similar to the prior study. Electronically Signed   By: Darliss Cheney M.D.   On: 10/26/2023 00:19     Scheduled Meds:  amiodarone  200 mg Oral BID   aspirin EC  81 mg Oral Daily   atorvastatin  40 mg Oral Daily   clopidogrel  75 mg Oral Daily   fluticasone furoate-vilanterol  1 puff Inhalation Daily   levothyroxine  75 mcg Oral Daily   [START ON 10/28/2023] metoprolol tartrate  25 mg Oral UD   pantoprazole  40 mg Oral BID   rOPINIRole  0.25 mg Oral QHS   sodium chloride flush  3 mL Intravenous Q12H   Continuous Infusions:     LOS: 2 days    Time spent: 35 mins    Willeen Niece, MD [facility]   If 7PM-7AM, please contact night-coverage

## 2023-10-27 NOTE — Progress Notes (Addendum)
Progress Note  Patient Name: Linda Brady Date of Encounter: 10/27/2023  Primary Cardiologist: Debbe Odea, MD   Subjective   Denies any chest pain or SOB.   Inpatient Medications    Scheduled Meds:  amiodarone  200 mg Oral BID   aspirin EC  81 mg Oral Daily   atorvastatin  40 mg Oral Daily   clopidogrel  75 mg Oral Daily   fluticasone furoate-vilanterol  1 puff Inhalation Daily   levothyroxine  75 mcg Oral Daily   pantoprazole  40 mg Oral BID   rOPINIRole  0.25 mg Oral QHS   sodium chloride flush  3 mL Intravenous Q12H   Continuous Infusions:  PRN Meds: acetaminophen, ALPRAZolam, FLUoxetine, hydrOXYzine, levalbuterol, morphine injection, ondansetron **OR** ondansetron (ZOFRAN) IV, sodium chloride flush   Vital Signs    Vitals:   10/27/23 0812 10/27/23 0827 10/27/23 0828 10/27/23 0914  BP:  97/62 97/62 (!) 115/53  Pulse:  88 87 78  Resp:  (!) 28 20 20   Temp:  97.6 F (36.4 C) 97.6 F (36.4 C)   TempSrc:  Oral    SpO2: 98% 99% 99% 99%    Intake/Output Summary (Last 24 hours) at 10/27/2023 1103 Last data filed at 10/26/2023 1923 Gross per 24 hour  Intake --  Output 950 ml  Net -950 ml   There were no vitals filed for this visit.  Telemetry    NSR  - Personally Reviewed  ECG    No new EKG to review Personally Reviewed  Physical Exam   GEN: Well nourished, well developed in no acute distress HEENT: Normal NECK: No JVD; No carotid bruits LYMPHATICS: No lymphadenopathy CARDIAC:RRR, no murmurs, rubs, gallops RESPIRATORY:  Clear to auscultation without rales, wheezing or rhonchi  ABDOMEN: Soft, non-tender, non-distended MUSCULOSKELETAL:  No edema; No deformity  SKIN: Warm and dry NEUROLOGIC:  Alert and oriented x 3 PSYCHIATRIC:  Normal affect  Labs    Chemistry Recent Labs  Lab 10/23/23 2152 10/25/23 0259 10/27/23 0413  NA 130* 131* 130*  K 3.6 3.4* 3.5  CL 97* 96* 98  CO2 24 24 26   GLUCOSE 131* 122* 105*  BUN 9 11 11    CREATININE 0.88 1.11* 1.04*  CALCIUM 9.2 8.8* 8.4*  GFRNONAA >60 50* 54*  ANIONGAP 9 11 6      Hematology Recent Labs  Lab 10/25/23 0259 10/26/23 0358 10/27/23 0413  WBC 12.2* 12.5* 8.2  RBC 2.96* 3.16* 2.57*  HGB 8.8* 9.5* 7.6*  HCT 27.4* 29.0* 23.9*  MCV 92.6 91.8 93.0  MCH 29.7 30.1 29.6  MCHC 32.1 32.8 31.8  RDW 13.6 13.6 13.7  PLT 311 231 304    Cardiac EnzymesNo results for input(s): "TROPONINI" in the last 168 hours. No results for input(s): "TROPIPOC" in the last 168 hours.   BNPNo results for input(s): "BNP", "PROBNP" in the last 168 hours.   DDimer No results for input(s): "DDIMER" in the last 168 hours.   Summary of Pertinent studies    TTE: 09/19/2023: EF 65-70%, G2DD, severe AS, mild MR   Patient Profile     82 y.o. female with a hx of nonobstructive CAD, COPD, hypertension, hypothyroidism, restless leg syndrome, anxiety, depression, aortic stenosis, atrial fibrillation s/p repair of a right arm pseudoaneurysm after recent cath.for TAVR workup  She is being seen for the evaluation of atrial fibrillation at the request of Dr. Idelle Leech   Assessment & Plan    Atrial fibrillation with RVR Pt has history of PAF --  recently diagnosed Rates in AF have been rapid, resulting in CHF and hypotension with severe AS Started on amiodarone 200mg  PO BID yesterday until her AS is managed Home dose of Cardizem was stopped   Severe aortic stenosis Diagnosed 09/2023 Undergoing evaluation for TAVR Cath 10/23/23 showed 30% pLAD, 20% ostial to prox RCA, CO 9, PAP 48/43mmHg and PCWP  Continue outpt workup for TAVR>>getting her CHest/ABd/Pelvic CT for TAVR today and will finish up the rest of the workup outpt Home dose of Imdur stopped (should not be on in setting of severe AS and minimal CAD)   Brachial artery pseudoaneurysm S/p endovascular repair 11/22 for brachial artery dissection with pseudoaneurysm Now on ASA 81mg  daily and Plavix 75mg  daily x 30 days and  then stop Plavix and restart Eliquis Vascular surgery recommends waiting 30 days to restart Eliquis  HFpEF EF 65-60% on echo this admit with G2DD Severe AS likely primary contributor Appears euvolemic on exam today Home Hydrochlorothiazide stopped due to hyponatremia (Na 130 today)  CAD Elevated troponin due to demand ischemia in the setting of AF with RVR cardiac cath 11/21 30% pLAD, 20% o/pRCA  Continue ASA 81mg  daily and Atorvastatin 40mg  daily Check FLP as outpt  HTN BP has been on the soft side Home ACE I, Imdur, Cardizem and hydrochlorothiazide held Would not restart these for now  [facility] will sign off.   Medication Recommendations:  Amiodarone 200mg  BID, ASA 81mg  daily, Plavix 75mg  daily x 1 month and then d/c, Atorvastatin 40mg  daily, Eliquis 5mg  BID (starting in 30 days at time of d/c of Plavix) Other recommendations (labs, testing, etc):  FLP and ALT  Follow up as an outpatient:  Followup 12/3 with Cadence Furth PA and CVTS in December for TAVR workup, afib clinic in 1 week  I have personally spent 35 minutes involved in face-to-face and non-face-to-face activities for this patient on the day of the visit. Professional time spent includes the following activities: Preparing to see the patient (review of tests including cath and 2D echo), Obtaining and/or reviewing separately obtained history (admission/discharge record), Performing a medically appropriate examination and/or evaluation , Ordering medications/tests/procedures, referring and communicating with other health care professionals, Documenting clinical information in the EMR, Independently interpreting results (not separately reported), Communicating results to the patient/family/caregiver, Counseling and educating the patient/family/caregiver and Care coordination (not separately reported).   For questions or updates, please contact [facility] Please consult [email or web address] for contact info under  Cardiology/STEMI.      Signed, Armanda Magic, MD 10/27/2023, 11:03 AM

## 2023-10-27 NOTE — Progress Notes (Signed)
Progress Note    10/27/2023 6:48 AM 3 Days Post-Op  Subjective:  says her right arm and hand feel better.   afebrile  Vitals:   10/27/23 0022 10/27/23 0423  BP: (!) 105/55 (!) 115/54  Pulse: 81 75  Resp: 19 17  Temp: 97.9 F (36.6 C) 98.4 F (36.9 C)  SpO2: 99% 99%    Physical Exam: General:  no distress Lungs:  non labored Extremities:  brisk right ulnar and palmar arch signals; motor and sensory are in tact; some swelling in right arm with ecchymosis   CBC    Component Value Date/Time   WBC 8.2 10/27/2023 0413   RBC 2.57 (L) 10/27/2023 0413   HGB 7.6 (L) 10/27/2023 0413   HGB 11.2 10/01/2023 1533   HCT 23.9 (L) 10/27/2023 0413   HCT 34.4 10/01/2023 1533   PLT 304 10/27/2023 0413   PLT 392 10/01/2023 1533   MCV 93.0 10/27/2023 0413   MCV 94 10/01/2023 1533   MCV 99 09/01/2014 1631   MCH 29.6 10/27/2023 0413   MCHC 31.8 10/27/2023 0413   RDW 13.7 10/27/2023 0413   RDW 12.6 10/01/2023 1533   RDW 14.1 09/01/2014 1631   LYMPHSABS 1.4 09/18/2023 0402   LYMPHSABS 2.8 09/01/2014 1631   MONOABS 0.8 09/18/2023 0402   MONOABS 0.7 09/01/2014 1631   EOSABS 0.2 09/18/2023 0402   EOSABS 0.1 09/01/2014 1631   BASOSABS 0.1 09/18/2023 0402   BASOSABS 0.1 09/01/2014 1631    BMET    Component Value Date/Time   NA 130 (L) 10/27/2023 0413   NA 137 10/13/2023 1033   NA 135 (L) 09/01/2014 1631   K 3.5 10/27/2023 0413   K 3.2 (L) 09/01/2014 1631   CL 98 10/27/2023 0413   CL 99 09/01/2014 1631   CO2 26 10/27/2023 0413   CO2 29 09/01/2014 1631   GLUCOSE 105 (H) 10/27/2023 0413   GLUCOSE 112 (H) 09/01/2014 1631   BUN 11 10/27/2023 0413   BUN 14 10/13/2023 1033   BUN 9 09/01/2014 1631   CREATININE 1.04 (H) 10/27/2023 0413   CREATININE 0.91 09/01/2014 1631   CALCIUM 8.4 (L) 10/27/2023 0413   CALCIUM 9.1 09/01/2014 1631   GFRNONAA 54 (L) 10/27/2023 0413   GFRNONAA >60 09/01/2014 1631   GFRNONAA >60 11/12/2012 1342   GFRAA >60 09/01/2014 1631   GFRAA >60  11/12/2012 1342    INR    Component Value Date/Time   INR 0.9 09/10/2020 1620     Intake/Output Summary (Last 24 hours) at 10/27/2023 0648 Last data filed at 10/26/2023 1923 Gross per 24 hour  Intake --  Output 1800 ml  Net -1800 ml      Assessment/Plan:  82 y.o. female is s/p:  Stenting right axillary artery psa 10/24/2023 by Dr. Lenell Antu  3 Days Post-Op   -pt doing well this am with brisk doppler flow right ulnar.  Motor and sensory are in tact.  -some swelling right arm.  4" ace wrap applied to right arm starting at hand - pt states it feels better with wrap on.  -per Dr. Karin Lieu, she has been taking off her Eliquis for the time being with plans to restart in 30 days.  Continue aspirin Plavix.  Aspirin Plavix has been found to improve stent patency when used for 30 days.  After 30 days, Plavix can be discontinued and Eliquis restarted.  This obviously comes with a small risk of stroke, however this risk is low with the medication only  being held for 30 days.   Doreatha Massed, PA-C [facility] [phone] 10/27/2023 6:48 AM

## 2023-10-28 ENCOUNTER — Inpatient Hospital Stay (HOSPITAL_BASED_OUTPATIENT_CLINIC_OR_DEPARTMENT_OTHER): Payer: Medicare Other

## 2023-10-28 DIAGNOSIS — I7776 Dissection of artery of upper extremity: Secondary | ICD-10-CM | POA: Diagnosis not present

## 2023-10-28 DIAGNOSIS — I350 Nonrheumatic aortic (valve) stenosis: Secondary | ICD-10-CM

## 2023-10-28 LAB — CBC
HCT: 24.5 % — ABNORMAL LOW (ref 36.0–46.0)
Hemoglobin: 7.9 g/dL — ABNORMAL LOW (ref 12.0–15.0)
MCH: 29.6 pg (ref 26.0–34.0)
MCHC: 32.2 g/dL (ref 30.0–36.0)
MCV: 91.8 fL (ref 80.0–100.0)
Platelets: 341 10*3/uL (ref 150–400)
RBC: 2.67 MIL/uL — ABNORMAL LOW (ref 3.87–5.11)
RDW: 13.7 % (ref 11.5–15.5)
WBC: 7.6 10*3/uL (ref 4.0–10.5)
nRBC: 0 % (ref 0.0–0.2)

## 2023-10-28 LAB — BASIC METABOLIC PANEL
Anion gap: 9 (ref 5–15)
BUN: 9 mg/dL (ref 8–23)
CO2: 26 mmol/L (ref 22–32)
Calcium: 8.9 mg/dL (ref 8.9–10.3)
Chloride: 98 mmol/L (ref 98–111)
Creatinine, Ser: 0.88 mg/dL (ref 0.44–1.00)
GFR, Estimated: >60 mL/min (ref >60)
Glucose, Bld: 100 mg/dL — ABNORMAL HIGH (ref 70–99)
Potassium: 3.3 mmol/L — ABNORMAL LOW (ref 3.5–5.1)
Sodium: 133 mmol/L — ABNORMAL LOW (ref 135–145)

## 2023-10-28 LAB — PHOSPHORUS: Phosphorus: 2.7 mg/dL (ref 2.5–4.6)

## 2023-10-28 LAB — MAGNESIUM: Magnesium: 1.7 mg/dL (ref 1.7–2.4)

## 2023-10-28 MED ORDER — IOHEXOL 350 MG/ML SOLN
95.0000 mL | Freq: Once | INTRAVENOUS | Status: AC | PRN
  Administered 2023-10-28: 95 mL via INTRAVENOUS

## 2023-10-28 MED ORDER — METOPROLOL TARTRATE 25 MG PO TABS: 25.0000 mg | ORAL_TABLET | ORAL | 0 refills | Status: DC

## 2023-10-28 MED ORDER — AMIODARONE HCL 200 MG PO TABS: 200.0000 mg | ORAL_TABLET | Freq: Two times a day (BID) | ORAL | 0 refills | Status: DC

## 2023-10-28 MED ORDER — ASPIRIN 81 MG PO TBEC: 81.0000 mg | DELAYED_RELEASE_TABLET | Freq: Every day | ORAL | 12 refills | Status: DC

## 2023-10-28 MED ORDER — ROPINIROLE HCL 0.25 MG PO TABS: 0.2500 mg | ORAL_TABLET | Freq: Every day | ORAL | 0 refills | Status: AC

## 2023-10-28 MED ORDER — CLOPIDOGREL BISULFATE 75 MG PO TABS: 75.0000 mg | ORAL_TABLET | Freq: Every day | ORAL | 0 refills | Status: AC

## 2023-10-28 NOTE — TOC Transition Note (Signed)
Transition of Care (TOC) - CM/SW Discharge Note Donn Pierini RN, BSN Transitions of Care Unit 4E- RN Case Manager See Treatment Team for direct phone #  Patient Details  Name: Linda Brady MRN: [id number] Date of Birth: 1941/07/18  Transition of Care [facility]) CM/SW Contact:  Darrold Span, RN Phone Number: 10/28/2023, 2:54 PM   Clinical Narrative:    Pt stable for transition home today, Order placed for HHPT/OT  CM in to speak with pt at bedside, List provided for [facility] choice Per CMS guidelines from [email or web address] website with star ratings (copy placed in shadow chart)- pt voiced she just finished with [facility]- can not remember agency name- reviewed in [place]- [facility] serviced last.  Pt agreeable to use [facility] again.  Pt voiced she has needed DME at home, including home 02.  Address, phone # and PCP all confirmed.  Daughter to transport home.   Call made to [facility] liaison- Amy- referral has been accepted.   No further TOC needs noted.    Final next level of care: Home w Home Health Services Barriers to Discharge: No Barriers Identified   Patient Goals and CMS Choice CMS Medicare.gov Compare Post Acute Care list provided to:: Patient Choice offered to / list presented to : Patient  Discharge Placement    Home w/ [facility]          Discharge Plan and Services Additional resources added to the After Visit Summary for     Discharge Planning Services: CM Consult Post Acute Care Choice: Home Health          DME Arranged: N/A DME Agency: NA       HH Arranged: PT, OT HH Agency: [facility] Date [facility] Agency Contacted: 10/28/23 Time HH Agency Contacted: 1453 Representative spoke with at [facility] Agency: Amy  Social Determinants of Health (SDOH) Interventions SDOH Screenings   Food Insecurity: No Food Insecurity (10/24/2023)  Housing: Low Risk  (10/24/2023)  Transportation Needs: No Transportation Needs (10/24/2023)  Utilities: Not At Risk  (10/24/2023)  Financial Resource Strain: Low Risk  (09/30/2023)   Received [facility]  Tobacco Use: Medium Risk (10/23/2023)     Readmission Risk Interventions    10/28/2023    2:54 PM  Readmission Risk Prevention Plan  Transportation Screening Complete  PCP or Specialist Appt within 5-7 Days Complete  Home Care Screening Complete  Medication Review (RN CM) Complete

## 2023-10-28 NOTE — Progress Notes (Signed)
Discharge instructions reviewed with pt and her daughter.  Copy of instructions given to pt/daughter. Pt informed her scripts were sent to her pharmacy for pick up.   Pt to be d/c'd via wheelchair with belongings, with daughter.            To be escorted by staff.   Jaion Lagrange,RN SWOT

## 2023-10-28 NOTE — Progress Notes (Addendum)
  Progress Note    10/28/2023 8:16 AM 4 Days Post-Op  Subjective:  no major complaints. Wants to go home   Vitals:   10/28/23 0728 10/28/23 0800  BP: (!) 143/64   Pulse: 85   Resp:  18  Temp:  98.5 F (36.9 C)  SpO2:  95%   Physical Exam: Cardiac:  regular Lungs:  non labored Extremities:  RUE well perfused and warm. Swelling still present in arm and significant ecchymosis throughout. Motor and sensation intact Neurologic: alert and oriented  CBC    Component Value Date/Time   WBC 7.6 10/28/2023 0326   RBC 2.67 (L) 10/28/2023 0326   HGB 7.9 (L) 10/28/2023 0326   HGB 11.2 10/01/2023 1533   HCT 24.5 (L) 10/28/2023 0326   HCT 34.4 10/01/2023 1533   PLT 341 10/28/2023 0326   PLT 392 10/01/2023 1533   MCV 91.8 10/28/2023 0326   MCV 94 10/01/2023 1533   MCV 99 09/01/2014 1631   MCH 29.6 10/28/2023 0326   MCHC 32.2 10/28/2023 0326   RDW 13.7 10/28/2023 0326   RDW 12.6 10/01/2023 1533   RDW 14.1 09/01/2014 1631   LYMPHSABS 1.4 09/18/2023 0402   LYMPHSABS 2.8 09/01/2014 1631   MONOABS 0.8 09/18/2023 0402   MONOABS 0.7 09/01/2014 1631   EOSABS 0.2 09/18/2023 0402   EOSABS 0.1 09/01/2014 1631   BASOSABS 0.1 09/18/2023 0402   BASOSABS 0.1 09/01/2014 1631    BMET    Component Value Date/Time   NA 133 (L) 10/28/2023 0326   NA 137 10/13/2023 1033   NA 135 (L) 09/01/2014 1631   K 3.3 (L) 10/28/2023 0326   K 3.2 (L) 09/01/2014 1631   CL 98 10/28/2023 0326   CL 99 09/01/2014 1631   CO2 26 10/28/2023 0326   CO2 29 09/01/2014 1631   GLUCOSE 100 (H) 10/28/2023 0326   GLUCOSE 112 (H) 09/01/2014 1631   BUN 9 10/28/2023 0326   BUN 14 10/13/2023 1033   BUN 9 09/01/2014 1631   CREATININE 0.88 10/28/2023 0326   CREATININE 0.91 09/01/2014 1631   CALCIUM 8.9 10/28/2023 0326   CALCIUM 9.1 09/01/2014 1631   GFRNONAA >60 10/28/2023 0326   GFRNONAA >60 09/01/2014 1631   GFRNONAA >60 11/12/2012 1342   GFRAA >60 09/01/2014 1631   GFRAA >60 11/12/2012 1342    INR     Component Value Date/Time   INR 0.9 09/10/2020 1620     Intake/Output Summary (Last 24 hours) at 10/28/2023 0816 Last data filed at 10/27/2023 2354 Gross per 24 hour  Intake --  Output 2250 ml  Net -2250 ml     Assessment/Plan:  82 y.o. female is s/p right brachial artery stenting for pseudoaneurysm  4 Days Post-Op   RUE well perfused and warm. Motor and sensation intact Continue to elevate and use ACE as needed to help with swelling Continue Aspirin and Plavix x 30 days, Plavix can then be d/c and restart her on her home Eliquis Stable from vascular standpoint for discharge Follow up will be arranged in 1 month with RUE arterial duplex  Graceann Congress, PA-C [facility] [phone] 10/28/2023 8:16 AM  VASCULAR STAFF ADDENDUM: I have independently interviewed and examined the patient. I agree with the above.  DAPT for 30days while holding eliquis. Follow up in 1 month with R arm arterial duplex.   Daria Pastures MD [facility] Phone Number: [phone] 10/28/2023 11:14 AM

## 2023-10-28 NOTE — Discharge Summary (Signed)
Physician Discharge Summary  Linda Brady:[id number] DOB: Oct 23, 1941 DOA: 10/23/2023  PCP: [name], MD  Admit date: 10/23/2023  Discharge date: 10/28/2023  Admitted From: Home  Disposition:  Home with Home Health services  Recommendations for Outpatient Follow-up:  Follow up with PCP in 1-2 weeks. Please obtain BMP/CBC in one week Advised to follow-up with Cardiology as scheduled. Advised to continue dual antiplatelet therapy for 4 weeks. Advised to resume Eliquis after 4 weeks. Advised to follow-up with vascular surgery in 4 weeks for right upper extremity arterial duplex  Home Health:Home PT/OT Equipment/Devices: Home Health PT/OT  Discharge Condition: Stable CODE STATUS:Full code Diet recommendation: Heart Healthy  Brief [facility] Course: This 82 y.o. female with medical history significant of CAD, COPD, hypertension, dyslipidemia, GERD, hyponatremia, HFpEF, aortic stenosis, paroxysmal atrial fibrillation with RVR, hypothyroidism, restless leg syndrome, and anxiety/depression. She had  diagnostic right and left heart cath with right IJ and right radial approach.  Post-procedure she experienced (R) arm pain, manual pressure was held by nursing staff, and she was evaluated in PACU, at that time she did not have a hematoma on exam. She was instructed to hold Eliquis until Monday and discharged home.  Last night she presented to [facility] with ongoing right upper extremity pain and bruising. CTA of right upper extremity obtained and shows focal area of dissection in the proximal brachial artery, there is also a pseudoaneurysm in the medial aspect of the right upper arm.  Vascular surgery was consulted.  Patient admitted for further evaluation.  Patient underwent endovascular repair of the right axillary artery pseudoaneurysm,  tolerated well.  Vascular surgery recommended to hold Eliquis for 4 weeks.  Meanwhile continue patient on aspirin and  Plavix.  Patient was started on amiodarone for A-fib until her aortic stenosis is managed.  Patient had a CT chest abdomen and pelvis for workup for TAVR evaluation.  Patient felt much better and wants to be discharged.  Cardiology signed off.  Patient has appointment with cardiologist for TAVR evaluation.  Home health services arranged.  Discharge Diagnoses:  Principal Problem:   Dissection of right brachial artery (HCC) Active Problems:   Essential hypertension   Anxiety   GERD (gastroesophageal reflux disease)   Hyponatremia   Hypothyroidism   Chronic heart failure with preserved ejection fraction (HFpEF) (HCC)   Aortic stenosis, severe   COPD (chronic obstructive pulmonary disease) (HCC)   Dyslipidemia   Afib (HCC)   Restless leg syndrome  Brachial artery dissection: CTA right arm with focal area of dissection in the proximal brachial artery, there is also pseudoaneurysm in the medial aspect of the right upper arm. Vascular surgery consulted.  Patient underwent endovascular repair of the right axillary artery pseudoaneurysm. Nursing neurovascular checks Follow-up vascular surgery. Eliquis kept on hold. Eliquis to be resumed in 30 days. Continue aspirin and plavix   Atrial Fibrillation: Heart rate remains uncontrolled resulting CHF and hypotension with severe AS. Cardiology started amiodarone 200 mg p.o. twice daily until her AS is managed. Cardizem was discontinued.   HFpEF: Severe Aortic Stenosis: LVEF 65 to 70% in October 2024. Hold home hydrochlorothiazide in setting of hyponatremia Continue home Lasix . She is undergoing evaluation for TAVR. She is getting CT chest abdomen and pelvis, further workup as an outpatient   COPD: Not in acute exacerbation. Continue home Symbicort, PRN Albuterol nebulizer Continue home supplemental O2 PRN   Hyponatremia: Hold home Hydrochlorothiazide. Monitor sodium level.   Hypertension: Hold Vasotec, Imdur,  BP on  soft side. Hold  home hydrochlorothiazide.   CAD Hyperlipidemia: Continue home atorvastatin.   Hypothyroidism: Continue levothyroxine.   Restless Leg Syndrome: Continue Requip   GERD: Continue Protonix.   Generalized Anxiety Disorder: Continue home Prozac. Continue PRN qHS Xanax.  Discharge Instructions  Discharge Instructions     Call MD for:  difficulty breathing, headache or visual disturbances   Complete by: As directed    Call MD for:  persistant dizziness or light-headedness   Complete by: As directed    Call MD for:  persistant nausea and vomiting   Complete by: As directed    Diet - low sodium heart healthy   Complete by: As directed    Diet Carb Modified   Complete by: As directed    Discharge instructions   Complete by: As directed    Follow-up with primary care physician in 1 week. Advised to follow-up with cardiology as scheduled. Advised to continue dual antiplatelet therapy for 4 weeks. Advised to resume Eliquis after 4 weeks. Advised to follow-up with vascular surgery in 4 weeks for right upper extremity arterial duplex   Increase activity slowly   Complete by: As directed       Allergies as of 10/28/2023       Reactions   Naproxen Sodium Anaphylaxis        Medication List     STOP taking these medications    apixaban 5 MG Tabs tablet Commonly known as: ELIQUIS   diltiazem 120 MG 24 hr capsule Commonly known as: Cardizem CD   enalapril 20 MG tablet Commonly known as: VASOTEC   hydrochlorothiazide 25 MG tablet Commonly known as: HYDRODIURIL       TAKE these medications    ALPRAZolam 0.25 MG tablet Commonly known as: XANAX Take 0.25 mg by mouth at bedtime as needed for sleep.   amiodarone 200 MG tablet Commonly known as: PACERONE Take 1 tablet (200 mg total) by mouth 2 (two) times daily.   aspirin EC 81 MG tablet Take 1 tablet (81 mg total) by mouth daily. Swallow whole. Start taking on: October 29, 2023   atorvastatin 40 MG  tablet Commonly known as: LIPITOR Take 1 tablet (40 mg total) by mouth daily.   budesonide-formoterol 160-4.5 MCG/ACT inhaler Commonly known as: Symbicort Inhale 2 puffs into the lungs 2 (two) times daily.   cholecalciferol 25 MCG (1000 UNIT) tablet Commonly known as: VITAMIN D3 Take 1,000 Units by mouth daily.   clopidogrel 75 MG tablet Commonly known as: PLAVIX Take 1 tablet (75 mg total) by mouth daily for 28 days. Start taking on: October 29, 2023   Combivent Respimat 20-100 MCG/ACT Aers respimat Generic drug: Ipratropium-Albuterol Inhale 1 puff into the lungs every 6 (six) hours as needed for wheezing.   cyanocobalamin 1000 MCG tablet Commonly known as: VITAMIN B12 Take 1,000 mcg by mouth daily.   FLUoxetine 20 MG capsule Commonly known as: PROZAC Take 20 mg by mouth daily as needed (anxiety).   furosemide 20 MG tablet Commonly known as: LASIX Take 1 tablet (20 mg total) by mouth 2 (two) times daily.   isosorbide mononitrate 30 MG 24 hr tablet Commonly known as: IMDUR Take 1 tablet (30 mg total) by mouth daily.   levothyroxine 75 MCG tablet Commonly known as: SYNTHROID Take 75 mcg by mouth daily.   metoprolol tartrate 25 MG tablet Commonly known as: LOPRESSOR Take 1 tablet (25 mg total) by mouth as directed. What changed:  medication strength how much to take how  to take this when to take this additional instructions   pantoprazole 40 MG tablet Commonly known as: PROTONIX Take 1 tablet (40 mg total) by mouth 2 (two) times daily.   rOPINIRole 0.25 MG tablet Commonly known as: REQUIP Take 1 tablet (0.25 mg total) by mouth at bedtime.        Follow-up Information     [facility] Follow up in 5 week(s).   Specialty: Vascular Surgery Why: sent Contact information: [address] [place] [address] [phone]        [facility] Follow  up on 11/04/2023.   Specialty: Cardiology Why: @1 :30PM. with [place]. Upon entering the front of [facility], take elevator on the left to 6th floor, afib clinic is in [place]. Contact information: [address] [place] [address] [phone]        Quintella Reichert, MD Follow up in 1 week(s).   Specialty: Cardiology Contact information: [address] [place] [address] [phone]         [name], MD Follow up in 1 week(s).   Specialty: Family Medicine Contact information: [address] [facility] [place] [address] [phone]         [facility] Follow up.   Why: Iantha Fallen) HHPT/OT arranged- they will contact you to schedule Contact information: [address] [place] [address] [phone]                Allergies  Allergen Reactions   Naproxen Sodium Anaphylaxis    Consultations: Cardiology Vascular surgery   Procedures/Studies: CT ANGIO UP EXTREM RIGHT W &/OR WO CONTRAST  Result Date: 10/26/2023 CLINICAL DATA:  Upper extremity dissection. EXAM: CT ANGIOGRAPHY UPPER RIGHT EXTREMITY TECHNIQUE: Axial CT images of the right upper extremity during arterial phase imaging. Coronal and sagittal reformats were submitted as well as MIP reformats. CONTRAST:  OMNIPAQUE IOHEXOL 350 MG/ML SOLN COMPARISON:  Right upper extremity CT angiogram 10/23/2023. FINDINGS: There has been interval placement of a stent within the proximal right brachial artery. The right subclavian, axillary and brachial arteries appear patent. There is no evidence for significant stenosis, dissection or aneurysm. The proximal 1/3 of the radial artery is patent, but middle and distal radial artery is not opacified. The ulnar artery and interosseous arteries are well opacified the level of the wrist. There is subcutaneous edema of the anterior elbow and proximal arm. There some  heterogeneous intramuscular edema and hemorrhage anterior to the brachial artery similar to the prior study. Review of the MIP images confirms the above findings. IMPRESSION: 1. Interval placement of a stent within the proximal right brachial artery. Stent is patent. 2. The proximal 1/3 of the radial artery is patent, but the middle and distal radial artery is not opacified. The ulnar artery and interosseous arteries are well opacified the level of the wrist. 3. Heterogeneous intramuscular edema and hemorrhage anterior to the brachial artery similar to the prior study. Electronically Signed   By: Darliss Cheney M.D.   On: 10/26/2023 00:19   PERIPHERAL VASCULAR CATHETERIZATION  Result Date: 10/24/2023 DATE OF SERVICE: 10/24/2023  PATIENT:  Talbert Forest A Gornick  82 y.o. female  PRE-OPERATIVE DIAGNOSIS:  right axillary artery pseudoaneurysm  POST-OPERATIVE DIAGNOSIS:  Same  PROCEDURE:  1) Ultrasound guided right common femoral artery access 2) Arch aortogram 3) Innominate angiogram 4) Right upper  extremity angiogram 5) Ultrasound guided right radial artery access 6) Endovascular repair of right axillary artery pseudoaneurysm (7x71mm Viabahn) 7) Conscious sedation (99 minutes)  SURGEON:  Rande Brunt. Lenell Antu, MD  ASSISTANT: none  ANESTHESIA:   local and IV sedation  ESTIMATED BLOOD LOSS: minimal  LOCAL MEDICATIONS USED:  LIDOCAINE  COUNTS: confirmed correct.  PATIENT DISPOSITION:  PACU - hemodynamically stable.  Delay start of Pharmacological VTE agent (>24hrs) due to surgical blood loss or risk of bleeding: no  INDICATION FOR PROCEDURE: LAINEE WISHON is a 82 y.o. 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 After careful discussion of risks, benefits, and alternatives the patient was offered endovascular repair. The patient understood and wished to proceed.  OPERATIVE FINDINGS: Type III arch with highly angulated right subclavian artery Ultimately able to cross lesion by accessing right  radial artery and snaring wire Pseudoaneurysm successfully treated by covered angioplasty and stenting  DESCRIPTION OF PROCEDURE: After identification of the patient in the pre-operative holding area, the patient was transferred to the operating room. The patient was positioned supine on the operating room table. Anesthesia was induced. The groins was prepped and draped in standard fashion. A surgical pause was performed confirming correct patient, procedure, and operative location.  The right groin was anesthetized with subcutaneous injection of 1% lidocaine. Using ultrasound guidance, the right common femoral artery was accessed with micropuncture technique. Fluoroscopy was used to confirm cannulation over the femoral head. The 10F micropuncture sheath was upsized to 24F.  The patient was systemically heparinized.  A Glidewire advantage was navigated into the ascending aorta.  A pigtail catheter was advanced into the descending aorta.  Arch angiogram was performed.  Multiple catheter and wire exchanges were used to try to cannulate the innominate artery.  This was very challenging given the highly angulated nature of the aortic arch.  We changed our projection into an extreme left anterior oblique and caudal projection or able to eliminate some of the parallax in her arch.  I was able to select the innominate artery, but I was not able to deliver a wire retrograde into the axillary artery without it "kicking out".  The right radial artery was accessed with micropuncture technique.  A 4/5 French slender sheath was introduced into the radial artery after singlewall access.  A J-wire was navigated across the area of pseudoaneurysm atraumatically under fluoroscopic guidance.  The wire was advanced into the ascending aorta.  Over the wire a catheter was advanced into the ascending aorta.  A loop snare was advanced into the ascending aorta.  Using a combination of a barrier to catheter and Glidewire I was able to engage  the loop snare from the femoral access and externalized the wire from the radial access.  With this system built, I was able to deliver a 7 [place] by 90 cm sheath into the right upper extremity subclavian artery.  Selective angiography was performed of the right upper extremity.  The pseudoaneurysm identified and marked on the screen.  Her wire was exchanged for a V18 wire.  A 7 x 50 mm Viabahn stent was positioned across the pseudoaneurysm to avoid covering the profunda arteries.  The stent was deployed in standard fashion with good technical result.  The stent was placed angioplastied.  Per follow-up angiogram showed resolution of the pseudoaneurysm.  Satisfied we ended the case here.  All endovascular equipment was removed.  I attempted to use a Perclose device to close the groin.  We lost wire access and manual  pressure was held.  Conscious sedation was administered with the use of IV fentanyl and midazolam under continuous physician and nurse monitoring.  Heart rate, blood pressure, and oxygen saturation were continuously monitored.  Total sedation time was 99 minutes  Upon completion of the case instrument and sharps counts were confirmed correct. The patient was transferred to the PACU in good condition. I was present for all portions of the procedure.  PLAN: ASA 81mg  PO every day. Plavix 75mg  PO every day. High intensity statin therapy.  Rande Brunt. Lenell Antu, MD [facility] Phone Number: [phone] 10/24/2023 5:07 PM   CT ANGIO UP EXTREM RIGHT W &/OR WO CONTRAST  Result Date: 10/23/2023 CLINICAL DATA:  History of recent catheterization via the right radial artery with increasing arm pain and numbness initial encounter EXAM: CT ANGIOGRAPHY OF THE RIGHT UPPEREXTREMITY TECHNIQUE: Multidetector CT imaging of the right upper extremitywas performed using the standard protocol during bolus administration of intravenous contrast. Multiplanar CT image reconstructions  and MIPs were obtained to evaluate the vascular anatomy. RADIATION DOSE REDUCTION: This exam was performed according to the departmental dose-optimization program which includes automated exposure control, adjustment of the mA and/or kV according to patient size and/or use of iterative reconstruction technique. CONTRAST:  90mL OMNIPAQUE IOHEXOL 350 MG/ML SOLN COMPARISON:  None Available. FINDINGS: Vascular: The thoracic aorta demonstrates atherosclerotic calcifications without aneurysmal dilatation or dissection. Coronary calcifications are seen. No significant cardiac enlargement is noted. No pulmonary emboli are seen. Right innominate artery demonstrates mild atherosclerotic calcifications. Normal bifurcation into the right common carotid artery and right subclavian artery is noted. Axillary artery on the right appears within normal limits. The origins of the subscapular artery and posterior circumflex humeral artery are well visualized and within normal limits. Just beyond this level in the proximal aspect of the brachial artery there is a focal area of dissection identified best visualized from images number 155 to 177 of series 6. A small branch vessel arises from the false lumen and supplies a somewhat bilobed collection of contrast measuring up to 18 mm in greatest transverse dimension. The bilobed nature of the pseudoaneurysm measures up to 3.3 cm in craniocaudad projection. These changes are likely related to a pseudoaneurysm arising from the branch vessel. The more distal brachial artery appears within normal limits. Bifurcation into the radial and ulnar arteries appear within normal limits. The radial and ulnar arteries are visualized patent to the level of right wrist. Nonvascular: Visualized lung fields appear within normal limits with the exception of mild scarring in the right middle lobe. Visualized portions of the liver and right kidney show no acute abnormality. A hiatal hernia is noted of a  moderate degree. Visualized abdominal and pelvic structures are within normal limits. No acute bony abnormality is noted. 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 Considerable subcutaneous edema is noted in the upper arm and axillary region. This may represent some mild prior hemorrhage as well. Review of the MIP images confirms the above findings. IMPRESSION: Focal area of dissection in the proximal aspect of the brachial artery just beyond the origin of the subscapular artery. Arising from the false lumen there is a small branch vessel identified with supply of a bilobed pseudoaneurysm in the medial aspect of the right upper arm/axilla. Some local compression on the adjacent nerves may contribute to the patient's peripheral numbness. Vascular surgery consultation is recommended. Patent distal brachial artery as well as patent radial and ulnar  arteries. No acute nonvascular abnormality is noted. Critical Value/emergent results were called by telephone at the time of interpretation on 10/23/2023 at 11:39 pm to [facility] MD, who verbally acknowledged these results. Electronically Signed   By: Alcide Clever M.D.   On: 10/23/2023 23:39   CARDIAC CATHETERIZATION  Result Date: 10/23/2023   Prox LAD lesion is 30% stenosed.   Ost RCA to Prox RCA lesion is 20% stenosed. 1.  Mild nonobstructive coronary artery disease. 2.  Fick cardiac output of 9.0 L/min and Fick cardiac index of 4.9 L/min/m with the following hemodynamics  Right atrial pressure mean of 7 mmHg  Right ventricular pressure 48/27 with end-diastolic pressure of 14 mmHg  PA pressure 48/17 with a mean of 29 mmHg  Wedge pressure mean of 18 mmHg V waves to 27 mmHg  PVR 1.2 Woods units  PA pulsatility index of 4 Recommendation: Continue evaluation for aortic valve intervention.     Subjective: Patient was seen and examined at bedside.  Overnight events noted.   Patient reports doing much better,  wants to be  discharged.  Discharge Exam: Vitals:   10/28/23 0826 10/28/23 1157  BP:  125/71  Pulse: 70   Resp: 20 20  Temp:  97.7 F (36.5 C)  SpO2:  100%   Vitals:   10/28/23 0723 10/28/23 0728 10/28/23 0826 10/28/23 1157  BP: (!) 143/64 (!) 143/64  125/71  Pulse:  85 70   Resp: 18 18 20 20   Temp:  98 F (36.7 C)  97.7 F (36.5 C)  TempSrc:  Oral  Oral  SpO2: 99% 95%  100%    General: Pt is alert, awake, not in acute distress Cardiovascular: RRR, S1/S2 +, no rubs, no gallops Respiratory: CTA bilaterally, no wheezing, no rhonchi Abdominal: Soft, NT, ND, bowel sounds + Extremities: no edema, no cyanosis    The results of significant diagnostics from this hospitalization (including imaging, microbiology, ancillary and laboratory) are listed below for reference.     Microbiology: No results found for this or any previous visit (from the past 240 hour(s)).   Labs: BNP (last 3 results) Recent Labs    12/05/22 1049 09/18/23 0402 10/01/23 1533  BNP 89.5 161.8* 217.2*   Basic Metabolic Panel: Recent Labs  Lab 10/23/23 1131 10/23/23 2152 10/25/23 0259 10/27/23 0413 10/28/23 0326  NA 139 130* 131* 130* 133*  K 3.2* 3.6 3.4* 3.5 3.3*  CL  --  97* 96* 98 98  CO2  --  24 24 26 26   GLUCOSE  --  131* 122* 105* 100*  BUN  --  9 11 11 9   CREATININE  --  0.88 1.11* 1.04* 0.88  CALCIUM  --  9.2 8.8* 8.4* 8.9  MG  --   --  1.6* 1.9 1.7  PHOS  --   --   --  3.0 2.7   Liver Function Tests: No results for input(s): "AST", "ALT", "ALKPHOS", "BILITOT", "PROT", "ALBUMIN" in the last 168 hours. No results for input(s): "LIPASE", "AMYLASE" in the last 168 hours. No results for input(s): "AMMONIA" in the last 168 hours. CBC: Recent Labs  Lab 10/23/23 2152 10/24/23 0312 10/25/23 0259 10/26/23 0358 10/27/23 0413 10/28/23 0326  WBC 12.1*  --  12.2* 12.5* 8.2 7.6  HGB 10.4* 10.2* 8.8* 9.5* 7.6* 7.9*  HCT 31.8* 31.6* 27.4* 29.0* 23.9* 24.5*  MCV 92.7  --  92.6 91.8 93.0 91.8  PLT  382  --  311 231 304 341   Cardiac Enzymes:  No results for input(s): "CKTOTAL", "CKMB", "CKMBINDEX", "TROPONINI" in the last 168 hours. BNP: Invalid input(s): "POCBNP" CBG: Recent Labs  Lab 10/24/23 2116 10/25/23 0603  GLUCAP 117* 107*   D-Dimer No results for input(s): "DDIMER" in the last 72 hours. Hgb A1c No results for input(s): "HGBA1C" in the last 72 hours. Lipid Profile No results for input(s): "CHOL", "HDL", "LDLCALC", "TRIG", "CHOLHDL", "LDLDIRECT" in the last 72 hours. Thyroid function studies No results for input(s): "TSH", "T4TOTAL", "T3FREE", "THYROIDAB" in the last 72 hours.  Invalid input(s): "FREET3" Anemia work up No results for input(s): "VITAMINB12", "FOLATE", "FERRITIN", "TIBC", "IRON", "RETICCTPCT" in the last 72 hours. Urinalysis    Component Value Date/Time   COLORURINE COLORLESS (A) 09/18/2023 0634   APPEARANCEUR CLEAR (A) 09/18/2023 0634   APPEARANCEUR Hazy 11/12/2012 1355   LABSPEC 1.003 (L) 09/18/2023 0634   LABSPEC 1.005 11/12/2012 1355   PHURINE 8.0 09/18/2023 0634   GLUCOSEU NEGATIVE 09/18/2023 0634   GLUCOSEU Negative 11/12/2012 1355   HGBUR SMALL (A) 09/18/2023 0634   BILIRUBINUR NEGATIVE 09/18/2023 0634   BILIRUBINUR Negative 11/12/2012 1355   KETONESUR NEGATIVE 09/18/2023 0634   PROTEINUR NEGATIVE 09/18/2023 0634   NITRITE NEGATIVE 09/18/2023 0634   LEUKOCYTESUR NEGATIVE 09/18/2023 0634   LEUKOCYTESUR Trace 11/12/2012 1355   Sepsis Labs Recent Labs  Lab 10/25/23 0259 10/26/23 0358 10/27/23 0413 10/28/23 0326  WBC 12.2* 12.5* 8.2 7.6   Microbiology No results found for this or any previous visit (from the past 240 hour(s)).   Time coordinating discharge: Over 30 minutes  SIGNED:   Willeen Niece, MD  [facility] 10/28/2023, 4:23 PM Pager   If 7PM-7AM, please contact night-coverage

## 2023-10-28 NOTE — Evaluation (Signed)
Occupational Therapy Evaluation Patient Details Name: Linda Brady MRN: [id number] DOB: [birth year] Today's Date: 10/28/2023   History of Present Illness Pt is an 82 y.o. female presenting 11/22 with RUE pain and numbness after heart cath on 11/21. 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 Now s/p stenting and endovascular repair 11/22.  Recent admission 10/17 -  10/18 for COPD exacerbation. PMH significant for CAD, COPD, HTN, dyslipidemia, GERD, hyponatremia, HFpEF, aortic stenosis, pAfib with RVR, hypothyroidism, anxiety/depression.   Clinical Impression   PTA, pt living alone and reports she was mod I for ADL and IADL. Upon eval, pt with decreased strength, balance, safety, and knowledge of use of compensatory techniques. Requiring up to [facility] for safety during mobility and ADL. Reviewed and practiced compensatory techniques for ADL to minimize RUE discomfort. Will continue to follow acutely, but do not suspect need for follow up therapy.       If plan is discharge home, recommend the following: A little help with walking and/or transfers;A little help with bathing/dressing/bathroom;Assistance with cooking/housework;Assist for transportation;Help with stairs or ramp for entrance    Functional Status Assessment  Patient has had a recent decline in their functional status and demonstrates the ability to make significant improvements in function in a reasonable and predictable amount of time.  Equipment Recommendations  None recommended by OT    Recommendations for Other Services       Precautions / Restrictions Restrictions Other Position/Activity Restrictions: Per pt no pushing/pulling and no lifting more than 1 gallon of milk is what the doctor told her. Maintained predominantly nonweightbearing during session; did use RW for steadying assist only      Mobility Bed Mobility Overal bed  mobility: Needs Assistance Bed Mobility: Rolling, Sidelying to Sit, Sit to Supine Rolling: Supervision Sidelying to sit: Contact guard assist   Sit to supine: Contact guard assist   General bed mobility comments: no hands on assist needed. Reviewed compensatory techniques to decrese need to push or pull with her RUE    Transfers Overall transfer level: Needs assistance Equipment used: Rolling walker (2 wheels) Transfers: Sit to/from Stand Sit to Stand: Contact guard assist, Supervision           General transfer comment: approaching supervision.      Balance Overall balance assessment: Needs assistance         Standing balance support: Bilateral upper extremity supported, No upper extremity supported, During functional activity   Standing balance comment: benefits from UE support                           ADL either performed or assessed with clinical judgement   ADL Overall ADL's : Needs assistance/impaired Eating/Feeding: Modified independent;Sitting   Grooming: Wash/dry face;Oral care;Standing;Supervision/safety   Upper Body Bathing: Set up;Sitting Upper Body Bathing Details (indicate cue type and reason): reviewed compensatory techniuques for bathing Lower Body Bathing: Set up;Sit to/from stand   Upper Body Dressing : Set up;Sitting Upper Body Dressing Details (indicate cue type and reason): reviewed compensatory techniques Lower Body Dressing: Set up;Sit to/from stand   Toilet Transfer: Contact guard assist;Supervision/safety;Rolling walker (2 wheels) Toilet Transfer Details (indicate cue type and reason): CGA approaching supervision Toileting- Clothing Manipulation and Hygiene: Supervision/safety;Sit to/from stand       Functional mobility during ADLs: Supervision/safety;Rolling walker (2 wheels)       Vision Baseline Vision/History:  1 Wears glasses Ability to See in Adequate Light: 0 Adequate Patient Visual Report: No change from  baseline Vision Assessment?: No apparent visual deficits     Perception         Praxis         Pertinent Vitals/Pain Pain Assessment Pain Assessment: Faces Faces Pain Scale: Hurts little more Pain Location: RUE Pain Descriptors / Indicators: Discomfort, Sore Pain Intervention(s): Limited activity within patient's tolerance, Monitored during session     Extremity/Trunk Assessment Upper Extremity Assessment Upper Extremity Assessment: Generalized weakness;RUE deficits/detail RUE Deficits / Details: limited assessment seconary to procedure but moving hand functionally. Suspect some weakness.   Lower Extremity Assessment Lower Extremity Assessment: Defer to PT evaluation       Communication Communication Communication: No apparent difficulties Cueing Techniques: Verbal cues   Cognition Arousal: Alert Behavior During Therapy: [facility] for tasks assessed/performed Overall Cognitive Status: Within Functional Limits for tasks assessed                                 General Comments: some confusion as to which day Thanksgiving is on but otherwise [facility].     General Comments  SpO2 as low as 87 during mobility, but pt denied shortness of breath and poor pleth.    Exercises     Shoulder Instructions      Home Living Family/patient expects to be discharged to:: Private residence Living Arrangements: Alone Available Help at Discharge: Family;Available PRN/intermittently (pt reports daughter lives nearby and stops in to see her often) Type of Home: Other(Comment) (duplex) Home Access: Stairs to enter Entrance Stairs-Number of Steps: 4   Home Layout: One level     Bathroom Shower/Tub: Producer, television/film/video: Standard     Home Equipment: Shower seat - built Char[employer] fundraiser (2 wheels)          Prior Functioning/Environment Prior Level of Function : Independent/Modified Independent             Mobility Comments: ambulatory without AD ADLs  Comments: Mod I for ADL and IADL in the home. Daughter intermittently assists with house keeping and provides transportation.        OT Problem List: Decreased strength;Decreased activity tolerance;Impaired balance (sitting and/or standing);Impaired UE functional use      OT Treatment/Interventions: Self-care/ADL training;Therapeutic exercise;DME and/or AE instruction;Balance training;Patient/family education;Therapeutic activities    OT Goals(Current goals can be found in the care plan section) Acute Rehab OT Goals Patient Stated Goal: get home OT Goal Formulation: With patient Time For Goal Achievement: 11/11/23 Potential to Achieve Goals: Good  OT Frequency: Min 1X/week    Co-evaluation              AM-PAC OT "6 Clicks" Daily Activity     Outcome Measure Help from another person eating meals?: None Help from another person taking care of personal grooming?: A Little Help from another person toileting, which includes using toliet, bedpan, or urinal?: A Little Help from another person bathing (including washing, rinsing, drying)?: A Little Help from another person to put on and taking off regular upper body clothing?: A Little Help from another person to put on and taking off regular lower body clothing?: A Little 6 Click Score: 19   End of Session Equipment Utilized During Treatment: Gait belt;Rolling walker (2 wheels) Nurse Communication: Mobility status  Activity Tolerance: Patient tolerated treatment well Patient left: in bed;with call bell/phone within reach;with bed  alarm set  OT Visit Diagnosis: Unsteadiness on feet (R26.81);Muscle weakness (generalized) (M62.81)                Time: 1010-1037 OT Time Calculation (min): 27 min Charges:  OT General Charges $OT Visit: 1 Visit OT Evaluation $OT Eval Low Complexity: 1 Low OT Treatments $Self Care/Home Management : 8-22 mins  Tyler Deis, OTR/L [facility] Acute Rehabilitation Office:  [phone]    Linda Brady 10/28/2023, 12:08 PM

## 2023-10-28 NOTE — Evaluation (Signed)
Physical Therapy Evaluation Patient Details Name: Linda Brady MRN: [id number] DOB: 1941/08/20 Today's Date: 10/28/2023  History of Present Illness  Pt is an 82 y.o. female presenting 11/22 with RUE pain and numbness after heart cath on 11/21. 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 Now s/p stenting and endovascular repair 11/22.  Recent admission 10/17 -  10/18 for COPD exacerbation. PMH significant for CAD, COPD, HTN, dyslipidemia, GERD, hyponatremia, HFpEF, aortic stenosis, pAfib with RVR, hypothyroidism, anxiety/depression.   Clinical Impression  Pt admitted with above. Pt mildly unsteady requiring minA via HHA or use of RW for safe ambulation. Pt with increased stability and SPO2 > 95% when amb with 2LO2 via [place], vs no O2 where pt with DOE and SpO2 at 81%. Encouraged pt to have daughter spend the night with her the first few nights and to use her O2 at home until she feels stronger and HHPT can come out and re-assess her activity progression. Pt agrees with PT's recommendations. Acute PT to cont to follow.        If plan is discharge home, recommend the following: A little help with walking and/or transfers;A little help with bathing/dressing/bathroom;Assist for transportation;Help with stairs or ramp for entrance;Assistance with cooking/housework   Can travel by private vehicle        Equipment Recommendations None recommended by PT (has RW at home)  Recommendations for Other Services       Functional Status Assessment Patient has had a recent decline in their functional status and demonstrates the ability to make significant improvements in function in a reasonable and predictable amount of time.     Precautions / Restrictions Restrictions Weight Bearing Restrictions: No Other Position/Activity Restrictions: Per pt no pushing/pulling and no lifting more than 1 gallon of milk is what the  doctor told her. Maintained predominantly nonweightbearing during session;      Mobility  Bed Mobility Overal bed mobility: Needs Assistance Bed Mobility: Rolling, Sidelying to Sit, Sit to Supine Rolling: Supervision Sidelying to sit: Min assist   Sit to supine: Contact guard assist   General bed mobility comments: worked on rolling to the L with HOB flat to mimic home set up, minA for trunk elevation due to inability to use R UE functionally    Transfers Overall transfer level: Needs assistance Equipment used: None Transfers: Sit to/from Stand Sit to Stand: Contact guard assist, Supervision           General transfer comment: guarding for stability    Ambulation/Gait Ambulation/Gait assistance: Contact guard assist, Min assist Gait Distance (Feet): 80 Feet (x2) Assistive device: 1 person hand held assist Gait Pattern/deviations: Step-through pattern, Decreased stride length, Drifts right/left, Wide base of support Gait velocity: improved with O2 Gait velocity interpretation: <1.31 ft/sec, indicative of household ambulator   General Gait Details: +SOB/DOE with onset of fatigue requiring standing rest break when amb without O2, SpO2 at 81%, pt more unsteady requiring minA via L HHA, Pt then amb again with 2LO2 via [place], pt more steady, no rest break and fast cadance, pt reports "it's much better with the oxygen"  Stairs            Wheelchair Mobility     Tilt Bed    Modified Rankin (Stroke Patients Only)       Balance Overall balance assessment: Needs assistance Sitting-balance support: Feet supported, No upper extremity supported Sitting balance-Leahy Scale: Good  Standing balance support: No upper extremity supported, During functional activity Standing balance-Leahy Scale: Fair Standing balance comment: benefits from UE support                             Pertinent Vitals/Pain Pain Assessment Pain Assessment: Faces Faces Pain Scale:  Hurts little more Pain Location: RUE Pain Descriptors / Indicators: Discomfort, Sore Pain Intervention(s): Monitored during session    Home Living Family/patient expects to be discharged to:: Private residence Living Arrangements: Alone (reports dtr can stay with her) Available Help at Discharge: Family;Available PRN/intermittently (pt reports daughter lives nearby and stops in to see her often) Type of Home: Other(Comment) (duplex) Home Access: Stairs to enter Entrance Stairs-Rails: Right;Left;Can reach both Entrance Stairs-Number of Steps: 4   Home Layout: One level Home Equipment: Shower seat - built Char[employer] fundraiser (2 wheels)      Prior Function Prior Level of Function : Independent/Modified Independent             Mobility Comments: ambulatory without AD ADLs Comments: Mod I for ADL and IADL in the home. Daughter intermittently assists with house keeping and provides transportation.     Extremity/Trunk Assessment   Upper Extremity Assessment Upper Extremity Assessment: Defer to OT evaluation RUE Deficits / Details: limited assessment seconary to procedure but moving hand functionally. Suspect some weakness.    Lower Extremity Assessment Lower Extremity Assessment: Generalized weakness    Cervical / Trunk Assessment Cervical / Trunk Assessment: Normal  Communication   Communication Communication: Hearing impairment Cueing Techniques: Verbal cues  Cognition Arousal: Alert Behavior During Therapy: WFL for tasks assessed/performed Overall Cognitive Status: Within Functional Limits for tasks assessed                                 General Comments: pt with HOH and doesn't have hearing aides contributing to delayed response time        General Comments General comments (skin integrity, edema, etc.): SpO2 at 81% on RA during amb, SpO2 > 95% on 2Lo2 via [place] during amb    Exercises     Assessment/Plan    PT Assessment Patient needs continued  PT services  PT Problem List Decreased strength;Decreased activity tolerance;Decreased balance;Decreased mobility       PT Treatment Interventions DME instruction;Gait training;Stair training;Functional mobility training;Therapeutic activities;Therapeutic exercise    PT Goals (Current goals can be found in the Care Plan section)  Acute Rehab PT Goals Patient Stated Goal: home today PT Goal Formulation: With patient Time For Goal Achievement: 11/11/23 Potential to Achieve Goals: Good    Frequency Min 1X/week     Co-evaluation               AM-PAC PT "6 Clicks" Mobility  Outcome Measure Help needed turning from your back to your side while in a flat bed without using bedrails?: A Little Help needed moving from lying on your back to sitting on the side of a flat bed without using bedrails?: A Little Help needed moving to and from a bed to a chair (including a wheelchair)?: A Little Help needed standing up from a chair using your arms (e.g., wheelchair or bedside chair)?: A Little Help needed to walk in hospital room?: A Little Help needed climbing 3-5 steps with a railing? : A Little 6 Click Score: 18    End of Session Equipment Utilized During Treatment: Gait  belt;Oxygen Activity Tolerance: Patient tolerated treatment well Patient left: in bed;with call bell/phone within reach;with bed alarm set Nurse Communication: Mobility status PT Visit Diagnosis: Unsteadiness on feet (R26.81)    Time: 1115-1140 PT Time Calculation (min) (ACUTE ONLY): 25 min   Charges:   PT Evaluation $PT Eval Moderate Complexity: 1 Mod PT Treatments $Gait Training: 8-22 mins PT General Charges $$ ACUTE PT VISIT: 1 Visit         Lewis Shock, PT, DPT Acute Rehabilitation Services Secure chat preferred Office #: [phone]   Iona Hansen 10/28/2023, 12:54 PM

## 2023-10-28 NOTE — Care Management Important Message (Signed)
Important Message  Patient Details  Name: Linda Brady MRN: [id number] Date of Birth: [birth year]   Important Message Given:  Yes - Medicare IM     Sherilyn Banker 10/28/2023, 3:23 PM

## 2023-11-04 ENCOUNTER — Encounter: Payer: Self-pay | Admitting: Medical

## 2023-11-04 ENCOUNTER — Ambulatory Visit (HOSPITAL_COMMUNITY): Admitting: Internal Medicine

## 2023-11-04 ENCOUNTER — Ambulatory Visit: Payer: Medicare Other | Attending: Medical | Admitting: Medical

## 2023-11-04 VITALS — BP 150/70 | HR 77 | Ht 63.0 in | Wt 179.0 lb

## 2023-11-04 DIAGNOSIS — I7776 Dissection of artery of upper extremity: Secondary | ICD-10-CM | POA: Diagnosis not present

## 2023-11-04 DIAGNOSIS — Z79899 Other long term (current) drug therapy: Secondary | ICD-10-CM | POA: Diagnosis present

## 2023-11-04 DIAGNOSIS — E871 Hypo-osmolality and hyponatremia: Secondary | ICD-10-CM

## 2023-11-04 DIAGNOSIS — I480 Paroxysmal atrial fibrillation: Secondary | ICD-10-CM

## 2023-11-04 DIAGNOSIS — I10 Essential (primary) hypertension: Secondary | ICD-10-CM | POA: Diagnosis present

## 2023-11-04 DIAGNOSIS — I2510 Atherosclerotic heart disease of native coronary artery without angina pectoris: Secondary | ICD-10-CM

## 2023-11-04 DIAGNOSIS — I5032 Chronic diastolic (congestive) heart failure: Secondary | ICD-10-CM

## 2023-11-04 DIAGNOSIS — E876 Hypokalemia: Secondary | ICD-10-CM | POA: Diagnosis present

## 2023-11-04 DIAGNOSIS — I350 Nonrheumatic aortic (valve) stenosis: Secondary | ICD-10-CM | POA: Diagnosis not present

## 2023-11-04 MED ORDER — POTASSIUM CHLORIDE CRYS ER 20 MEQ PO TBCR: 40.0000 meq | EXTENDED_RELEASE_TABLET | Freq: Every day | ORAL | 0 refills | Status: DC

## 2023-11-04 NOTE — Progress Notes (Signed)
Cardiology Office Note:    Date:  11/04/2023   ID:  Linda Brady, DOB [birth year], MRN [id number]  PCP:  [name], MD  [facility] Cardiologist:  Debbe Odea, MD  [facility] Electrophysiologist:  None   Referring MD: [name], MD   Chief Complaint: Hospital follow-up  History of Present Illness:    Linda Brady is a 82 y.o. female with a hx of nonobstructive CAD, COPD, HTN, hypothyroidism, restless leg syndrome, anxiety, depression, aortic stenosis, atrial fibrillation who is being seen for hospital follow-up.   Cardiac cath in 2021 showed 50% pRCA lesion and 30% LAD lesion. Seen 12/2021 with chest pain found to have mildly elevated troponins, which was felt to be deman ischemia. Echo showed preserved EF and she was started on Imdur. She underwent outpatient Myoview with no ischemia, overall low risk.   She was admitted 10/17 found to have new onset Afib treated with IV Cardizem and converted to sinus rhythm. She was placed on Eliquis. Echo showed LVEF 65-70%, G2DD, mild MR and mild AI, severe AS with a mean gradient of 49.69mmHG. she was seen back in the office and was referred for TAVR. She underwent right and left heart cath 11/21 with Dr. Lynnette Caffey. Post cath she was noted to have a right arm hematoma. Site was evaluated prior to discharge. She was insructed to resume Eliquis 11/25. Presented back to the ER later that evening with complaints of worsening arm pain. She underwent CTA which demonstrated upper arm/axilla brachial artery pseudoaneurysm. Seen by Vascular surgery and underwent endovascular repair of right brachial artery. She was placed on ASA and Plavix. On 11/23 she developed afib and cardiology was asked to see. She was started on amiodarone and discharged home. Hydrochlorothiazide was stopped for hyponatremia. Plan for TAVR work-up as outpatient.   Today, the patient reports she is feeling terrible. She has soreness in her whole right side. She  is walking with a walker at home. She reports breathing is worse. She is using oxygen at home. She has some chest pain and has a little bit of chest pain. Cath site and groin appear stable.   Past Medical History:  Diagnosis Date   COPD (chronic obstructive pulmonary disease) (HCC)    GERD (gastroesophageal reflux disease)    Hypertension    Hypothyroidism    Thyroid disease     Past Surgical History:  Procedure Laterality Date   BREAST CYST ASPIRATION Left 10 plus yrs   benign-twice   CATARACT EXTRACTION W/PHACO Left 03/20/2023   Procedure: CATARACT EXTRACTION PHACO AND INTRAOCULAR LENS PLACEMENT (IOC) LEFT VISION BLUE;  Surgeon: Estanislado Pandy, MD;  Location: [facility];  Service: Ophthalmology;  Laterality: Left;  21.02   01:38.2   LEFT HEART CATH AND CORONARY ANGIOGRAPHY N/A 09/11/2020   Procedure: LEFT HEART CATH AND CORONARY ANGIOGRAPHY;  Surgeon: Marcina Millard, MD;  Location: [facility] INVASIVE CV LAB;  Service: Cardiovascular;  Laterality: N/A;   PERIPHERAL VASCULAR INTERVENTION Right 10/24/2023   Procedure: PERIPHERAL VASCULAR INTERVENTION;  Surgeon: Leonie Douglas, MD;  Location: [facility] INVASIVE CV LAB;  Service: Cardiovascular;  Laterality: Right;  right axillary stent   RIGHT/LEFT HEART CATH AND CORONARY ANGIOGRAPHY N/A 10/23/2023   Procedure: RIGHT/LEFT HEART CATH AND CORONARY ANGIOGRAPHY;  Surgeon: Orbie Pyo, MD;  Location: [facility] INVASIVE CV LAB;  Service: Cardiovascular;  Laterality: N/A;   UPPER EXTREMITY ANGIOGRAPHY Right 10/24/2023   Procedure: Upper Extremity Angiography;  Surgeon: Leonie Douglas, MD;  Location: [facility] INVASIVE  CV LAB;  Service: Cardiovascular;  Laterality: Right;    Current Medications: Current Meds  Medication Sig   amiodarone (PACERONE) 200 MG tablet Take 1 tablet (200 mg total) by mouth 2 (two) times daily.   aspirin EC 81 MG tablet Take 1 tablet (81 mg total) by mouth daily. Swallow whole.   atorvastatin (LIPITOR) 40 MG tablet  Take 1 tablet (40 mg total) by mouth daily.   budesonide-formoterol (SYMBICORT) 160-4.5 MCG/ACT inhaler Inhale 2 puffs into the lungs 2 (two) times daily.   cholecalciferol (VITAMIN D3) 25 MCG (1000 UNIT) tablet Take 1,000 Units by mouth daily.   clopidogrel (PLAVIX) 75 MG tablet Take 1 tablet (75 mg total) by mouth daily for 28 days.   FLUoxetine (PROZAC) 20 MG capsule Take 20 mg by mouth daily as needed (anxiety).   furosemide (LASIX) 20 MG tablet Take 1 tablet (20 mg total) by mouth 2 (two) times daily.   Ipratropium-Albuterol (COMBIVENT RESPIMAT) 20-100 MCG/ACT AERS respimat Inhale 1 puff into the lungs every 6 (six) hours as needed for wheezing.   isosorbide mononitrate (IMDUR) 30 MG 24 hr tablet Take 1 tablet (30 mg total) by mouth daily.   levothyroxine (SYNTHROID) 75 MCG tablet Take 75 mcg by mouth daily.   pantoprazole (PROTONIX) 40 MG tablet Take 1 tablet (40 mg total) by mouth 2 (two) times daily.   potassium chloride SA (KLOR-CON M20) 20 MEQ tablet Take 2 tablets (40 mEq total) by mouth daily for 2 days.   rOPINIRole (REQUIP) 0.25 MG tablet Take 1 tablet (0.25 mg total) by mouth at bedtime.   vitamin B-12 (CYANOCOBALAMIN) 1000 MCG tablet Take 1,000 mcg by mouth daily.     Allergies:   Naproxen sodium   Social History   Socioeconomic History   Marital status: Widowed    Spouse name: Not on file   Number of children: Not on file   Years of education: Not on file   Highest education level: Not on file  Occupational History   Not on file  Tobacco Use   Smoking status: Former    Current packs/day: 0.50    Average packs/day: 0.5 packs/day for 12.0 years (6.0 ttl pk-yrs)    Types: Cigarettes   Smokeless tobacco: Never   Tobacco comments:    7-8 cigarettes a day  Vaping Use   Vaping status: Never Used  Substance and Sexual Activity   Alcohol use: Not Currently   Drug use: Not Currently   Sexual activity: Not Currently  Other Topics Concern   Not on file  Social History  Narrative   Not on file   Social Determinants of Health   Financial Resource Strain: Low Risk  (09/30/2023)   Received from [facility]   Overall Financial Resource Strain (CARDIA)    Difficulty of Paying Living Expenses: Not hard at all  Food Insecurity: No Food Insecurity (10/24/2023)   Hunger Vital Sign    Worried About Running Out of Food in the Last Year: Never true    Ran Out of Food in the Last Year: Never true  Transportation Needs: No Transportation Needs (10/24/2023)   PRAPARE - Administrator, Civil Service (Medical): No    Lack of Transportation (Non-Medical): No  Physical Activity: Not on file  Stress: Not on file  Social Connections: Not on file     Family History: The patient's family history is negative for Breast cancer.  ROS:   Please see the history of present illness.  All other systems reviewed and are negative.  EKGs/Labs/Other Studies Reviewed:    The following studies were reviewed today:  R/L heart cath 10/23/23   Prox LAD lesion is 30% stenosed.   Ost RCA to Prox RCA lesion is 20% stenosed.   1.  Mild nonobstructive coronary artery disease. 2.  Fick cardiac output of 9.0 L/min and Fick cardiac index of 4.9 L/min/m with the following hemodynamics             Right atrial pressure mean of 7 mmHg             Right ventricular pressure 48/27 with end-diastolic pressure of 14 mmHg             PA pressure 48/17 with a mean of 29 mmHg             Wedge pressure mean of 18 mmHg V waves to 27 mmHg             PVR 1.2 Woods units             PA pulsatility index of 4   Recommendation: Continue evaluation for aortic valve intervention.  Echo 09/2023 1. Left ventricular ejection fraction, by estimation, is 65 to 70%. The  left ventricle has normal function. The left ventricle has no regional  wall motion abnormalities. Left ventricular diastolic parameters are  consistent with Grade II diastolic  dysfunction  (pseudonormalization).   2. Right ventricular systolic function is normal. The right ventricular  size is normal. There is mildly elevated pulmonary artery systolic  pressure.   3. The mitral valve is degenerative. Mild mitral valve regurgitation.   4. The aortic valve is calcified. Aortic valve regurgitation is mild.  Severe aortic valve stenosis. Aortic valve area, by VTI measures 0.99 cm.  Aortic valve mean gradient measures 49.2 mmHg. Aortic valve Vmax measures  4.56 m/s.   5. The inferior vena cava is normal in size with greater than 50%  respiratory variability, suggesting right atrial pressure of 3 mmHg.   Conclusion(s)/Recommendation(s): Findings consistent with severe valvular  heart disease.   EKG:  EKG is  ordered today.  The ekg ordered today demonstrates NSR 77bpm, LAD, nonspecific ST changes  Recent Labs: 09/18/2023: ALT 12; TSH 1.152 10/01/2023: BNP 217.2 10/15/2023: NT-Pro BNP 533 10/28/2023: BUN 9; Creatinine, Ser 0.88; Hemoglobin 7.9; Magnesium 1.7; Platelets 341; Potassium 3.3; Sodium 133  Recent Lipid Panel    Component Value Date/Time   CHOL 158 09/10/2020 1452   TRIG 100 09/10/2020 1452   HDL 53 09/10/2020 1452   CHOLHDL 3.0 09/10/2020 1452   VLDL 20 09/10/2020 1452   LDLCALC 85 09/10/2020 1452     Physical Exam:    VS:  BP (!) 150/70 (BP Location: Left Arm, Patient Position: Sitting, Cuff Size: Large)   Pulse 77   Ht 5\' 3"  (1.6 m)   Wt 179 lb (81.2 kg)   SpO2 95%   BMI 31.71 kg/m     Wt Readings from Last 3 Encounters:  11/04/23 179 lb (81.2 kg)  10/23/23 181 lb (82.1 kg)  10/15/23 181 lb (82.1 kg)     GEN:  Well nourished, well developed in no acute distress HEENT: Normal NECK: No JVD; No carotid bruits LYMPHATICS: No lymphadenopathy CARDIAC: RRR, + murmur, no rubs, gallops RESPIRATORY:  decreased breath sounds ABDOMEN: Soft, non-tender, non-distended MUSCULOSKELETAL:  No edema; No deformity  SKIN: Warm and dry NEUROLOGIC:  Alert and  oriented x 3 PSYCHIATRIC:  Normal affect  ASSESSMENT:    1. Aortic valve stenosis, etiology of cardiac valve disease unspecified   2. PAF (paroxysmal atrial fibrillation) (HCC)   3. Dissection of right brachial artery (HCC)   4. Chronic heart failure with preserved ejection fraction (HFpEF) (HCC)   5. Medication management   6. Coronary artery disease involving native coronary artery of native heart without angina pectoris   7. Essential hypertension   8. Hyponatremia   9. Hypokalemia    PLAN:    In order of problems listed above:  Severe Aortic stenosis Patient is currently undergoing workup for TAVR.  She had cardiac cath on 11/21 that showed nonobstructive CAD.  Post cath she was found to have brachial artery dissection with pseudoaneurysm. She was admitted and underwent endovascular repair on 11/22.  Patient has been back home a week now and reports short of breath that is worse.  She has bruising up and down her right arm.  Cath site and groin site overall look stable.  She is taking aspirin and Plavix for the stent. She reports she is able to walk with a walker.  Patient does not look volume up on exam.  She does have decreased breath sounds at bases.  I will increase Lasix for 40 mg twice daily x 2 days, then go back to 20 mg twice daily.  I will also add on potassium for 2 days.  I will check a CBC, BMET and BNP.  Paroxysmal Afib Patient had an episode of A-fib RVR during admission she was started on amiodarone.  Eliquis was held in the setting of procedures and use of aspirin and Plavix.  Plan to start Eliquis after finishing 28 days of Plavix.  She is in normal sinus rhythm today. Continue amiodarone 200mg  daily.   Brachial artery dissection and pseudoaneurysm Post cath patient had CTA right arm which showed dissection in the proximal brachial artery and pseudoaneurysm in the medial aspect of the right upper arm. She underwent endovascular repair of the right axillary  artery pseudoaneurysm.   HFpEF No lower leg edema on exam. Decreased breath sounds at lung bases. CT chest 11/26 showed small left pleural effusion. Increase lasix  as above for 2 days, and add potassium.   Nonobstructive CAD Cardiac cath on November 21 showed 30% pLAD, 20% o/pRCA.  Continue aspirin milligrams daily and Lipitor 40 mg daily.  HTN She was hypotensive in the hospital. Imdur, Vasotec and hydrochlorothiazide were held. Blood pressure mildly elevated today.  No changes for now. Will re-evaluate need for BP med at follow-up.   Hyponatremia Hydrochlorothiazide held during hospitalization. Re-checl BMET  Hypokalemia Give potassium as above. BMET today.   Disposition: Follow up in 2 month(s) with MD/APP    Signed, Karli Wickizer David Stall, PA-C  11/04/2023 10:15 AM    [facility]

## 2023-11-04 NOTE — Patient Instructions (Signed)
Medication Instructions:  Your physician recommends the following medication changes.  START TAKING: Potassium 40 meq daily for 2 days   INCREASE: Lasix to 40 mg twice a day for 2 days, then decrease back to 20 mg twice a day   *If you need a refill on your cardiac medications before your next appointment, please call your pharmacy*   Lab Work: Your provider would like for you to have following labs drawn today (CBC, BNP, BMP).     Testing/Procedures: No test ordered today    Follow-Up: At [facility], you and your health needs are our priority.  As part of our continuing mission to provide you with exceptional heart care, we have created designated Provider Care Teams.  These Care Teams include your primary Cardiologist (physician) and Advanced Practice Providers (APPs -  Physician Assistants and Nurse Practitioners) who all work together to provide you with the care you need, when you need it.  We recommend signing up for the patient portal called "MyChart".  Sign up information is provided on this After Visit Summary.  MyChart is used to connect with patients for Virtual Visits (Telemedicine).  Patients are able to view lab/test results, encounter notes, upcoming appointments, etc.  Non-urgent messages can be sent to your provider as well.   To learn more about what you can do with MyChart, go to [email or web address].    Your next appointment:   January 01, 2023 @ 11:40 am  Provider:   Debbe Odea, MD

## 2023-11-05 LAB — CBC
Hematocrit: 28 % — ABNORMAL LOW (ref 34.0–46.6)
Hemoglobin: 9.5 g/dL — ABNORMAL LOW (ref 11.1–15.9)
MCH: 32.8 pg (ref 26.6–33.0)
MCHC: 33.9 g/dL (ref 31.5–35.7)
MCV: 97 fL (ref 79–97)
Platelets: 585 10*3/uL — ABNORMAL HIGH (ref 150–450)
RBC: 2.9 x10E6/uL — ABNORMAL LOW (ref 3.77–5.28)
RDW: 13.7 % (ref 11.7–15.4)
WBC: 6.8 10*3/uL (ref 3.4–10.8)

## 2023-11-05 LAB — BASIC METABOLIC PANEL
BUN/Creatinine Ratio: 10 — ABNORMAL LOW (ref 12–28)
BUN: 9 mg/dL (ref 8–27)
CO2: 30 mmol/L — ABNORMAL HIGH (ref 20–29)
Calcium: 9.9 mg/dL (ref 8.7–10.3)
Chloride: 90 mmol/L — ABNORMAL LOW (ref 96–106)
Creatinine, Ser: 0.94 mg/dL (ref 0.57–1.00)
Glucose: 95 mg/dL (ref 70–99)
Potassium: 4.1 mmol/L (ref 3.5–5.2)
Sodium: 137 mmol/L (ref 134–144)
eGFR: 61 mL/min/{1.73_m2} (ref >59)

## 2023-11-05 LAB — BRAIN NATRIURETIC PEPTIDE: BNP: 132.4 pg/mL — ABNORMAL HIGH (ref 0.0–100.0)

## 2023-11-07 ENCOUNTER — Other Ambulatory Visit: Payer: Self-pay | Admitting: *Deleted

## 2023-11-07 DIAGNOSIS — I7776 Dissection of artery of upper extremity: Secondary | ICD-10-CM

## 2023-11-11 ENCOUNTER — Telehealth: Payer: Self-pay | Admitting: Internal Medicine

## 2023-11-11 NOTE — Telephone Encounter (Signed)
Spoke to dental office who stated Dr. Lynnette Caffey sends pt to their office for dental evaluations prior to any procedure. Office rep stated his office typically sends a clearance form for them to review.  Will forward message to Dr. Trula Ore office

## 2023-11-11 NOTE — Telephone Encounter (Signed)
I spoke with Dr Frederik Pear office and they now have referral on file for dental evaluation and treatment.

## 2023-11-11 NOTE — Telephone Encounter (Signed)
Office calling in regards to Clearance for pt. Office states that our office normally send clearance to them. Please advise

## 2023-11-17 ENCOUNTER — Emergency Department
Admission: EM | Admit: 2023-11-17 | Discharge: 2023-11-17 | Disposition: A | Discharge status: Home / Self Care | Attending: Emergency Medicine | Admitting: Emergency Medicine

## 2023-11-17 ENCOUNTER — Other Ambulatory Visit: Payer: Self-pay

## 2023-11-17 ENCOUNTER — Encounter: Payer: Self-pay | Admitting: Emergency Medicine

## 2023-11-17 ENCOUNTER — Emergency Department

## 2023-11-17 DIAGNOSIS — J449 Chronic obstructive pulmonary disease, unspecified: Secondary | ICD-10-CM | POA: Diagnosis not present

## 2023-11-17 DIAGNOSIS — I509 Heart failure, unspecified: Secondary | ICD-10-CM | POA: Insufficient documentation

## 2023-11-17 DIAGNOSIS — R079 Chest pain, unspecified: Secondary | ICD-10-CM | POA: Diagnosis present

## 2023-11-17 DIAGNOSIS — I110 Hypertensive heart disease with heart failure: Secondary | ICD-10-CM | POA: Insufficient documentation

## 2023-11-17 DIAGNOSIS — K219 Gastro-esophageal reflux disease without esophagitis: Secondary | ICD-10-CM | POA: Diagnosis not present

## 2023-11-17 LAB — HEPATIC FUNCTION PANEL
ALT: 16 U/L (ref 0–44)
AST: 20 U/L (ref 15–41)
Albumin: 3.6 g/dL (ref 3.5–5.0)
Alkaline Phosphatase: 63 U/L (ref 38–126)
Bilirubin, Direct: <0.1 mg/dL (ref 0.0–0.2)
Total Bilirubin: 0.5 mg/dL (ref <1.2)
Total Protein: 6.8 g/dL (ref 6.5–8.1)

## 2023-11-17 LAB — BASIC METABOLIC PANEL
Anion gap: 11 (ref 5–15)
BUN: 14 mg/dL (ref 8–23)
CO2: 28 mmol/L (ref 22–32)
Calcium: 9.4 mg/dL (ref 8.9–10.3)
Chloride: 91 mmol/L — ABNORMAL LOW (ref 98–111)
Creatinine, Ser: 1.1 mg/dL — ABNORMAL HIGH (ref 0.44–1.00)
GFR, Estimated: 51 mL/min — ABNORMAL LOW (ref >60)
Glucose, Bld: 124 mg/dL — ABNORMAL HIGH (ref 70–99)
Potassium: 2.4 mmol/L — CL (ref 3.5–5.1)
Sodium: 130 mmol/L — ABNORMAL LOW (ref 135–145)

## 2023-11-17 LAB — LIPASE, BLOOD: Lipase: 40 U/L (ref 11–51)

## 2023-11-17 LAB — CBC
HCT: 32.8 % — ABNORMAL LOW (ref 36.0–46.0)
Hemoglobin: 10.9 g/dL — ABNORMAL LOW (ref 12.0–15.0)
MCH: 31.2 pg (ref 26.0–34.0)
MCHC: 33.2 g/dL (ref 30.0–36.0)
MCV: 94 fL (ref 80.0–100.0)
Platelets: 419 10*3/uL — ABNORMAL HIGH (ref 150–400)
RBC: 3.49 MIL/uL — ABNORMAL LOW (ref 3.87–5.11)
RDW: 14.8 % (ref 11.5–15.5)
WBC: 5.6 10*3/uL (ref 4.0–10.5)
nRBC: 0 % (ref 0.0–0.2)

## 2023-11-17 LAB — MAGNESIUM: Magnesium: 1.8 mg/dL (ref 1.7–2.4)

## 2023-11-17 LAB — TROPONIN I (HIGH SENSITIVITY)
Troponin I (High Sensitivity): 21 ng/L — ABNORMAL HIGH (ref <18)
Troponin I (High Sensitivity): 19 ng/L — ABNORMAL HIGH (ref <18)

## 2023-11-17 MED ORDER — FUROSEMIDE 40 MG PO TABS
20.0000 mg | ORAL_TABLET | ORAL | Status: AC
  Administered 2023-11-17: 20 mg via ORAL
  Filled 2023-11-17: qty 1

## 2023-11-17 MED ORDER — SUCRALFATE 1 G PO TABS: 1.0000 g | ORAL_TABLET | Freq: Four times a day (QID) | ORAL | 1 refills | Status: DC

## 2023-11-17 MED ORDER — PANTOPRAZOLE SODIUM 40 MG PO TBEC: 40.0000 mg | DELAYED_RELEASE_TABLET | Freq: Two times a day (BID) | ORAL | 1 refills | Status: DC

## 2023-11-17 MED ORDER — POTASSIUM CHLORIDE CRYS ER 20 MEQ PO TBCR
40.0000 meq | EXTENDED_RELEASE_TABLET | Freq: Once | ORAL | Status: AC
  Administered 2023-11-17: 40 meq via ORAL
  Filled 2023-11-17: qty 2

## 2023-11-17 MED ORDER — AMIODARONE HCL 200 MG PO TABS
200.0000 mg | ORAL_TABLET | ORAL | Status: AC
  Administered 2023-11-17: 200 mg via ORAL
  Filled 2023-11-17: qty 1

## 2023-11-17 MED ORDER — SUCRALFATE 1 G PO TABS
1.0000 g | ORAL_TABLET | Freq: Once | ORAL | Status: AC
  Administered 2023-11-17: 1 g via ORAL
  Filled 2023-11-17: qty 1

## 2023-11-17 MED ORDER — PANTOPRAZOLE SODIUM 40 MG IV SOLR
40.0000 mg | Freq: Once | INTRAVENOUS | Status: AC
  Administered 2023-11-17: 40 mg via INTRAVENOUS
  Filled 2023-11-17: qty 10

## 2023-11-17 MED ORDER — FAMOTIDINE 20 MG PO TABS
40.0000 mg | ORAL_TABLET | Freq: Once | ORAL | Status: AC
  Administered 2023-11-17: 40 mg via ORAL
  Filled 2023-11-17: qty 2

## 2023-11-17 MED ORDER — LEVOTHYROXINE SODIUM 50 MCG PO TABS: 75.0000 ug | ORAL_TABLET | ORAL | Status: DC

## 2023-11-17 MED ORDER — FLUOXETINE HCL 10 MG PO CAPS: 20.0000 mg | ORAL_CAPSULE | Freq: Every day | ORAL | 0 refills | Status: DC | PRN

## 2023-11-17 MED ORDER — ASPIRIN 81 MG PO TBEC
81.0000 mg | DELAYED_RELEASE_TABLET | ORAL | Status: AC
  Administered 2023-11-17: 81 mg via ORAL
  Filled 2023-11-17: qty 1

## 2023-11-17 MED ORDER — ISOSORBIDE MONONITRATE ER 60 MG PO TB24
30.0000 mg | ORAL_TABLET | ORAL | Status: AC
  Administered 2023-11-17: 30 mg via ORAL
  Filled 2023-11-17: qty 1

## 2023-11-17 MED ORDER — CLOPIDOGREL BISULFATE 75 MG PO TABS
75.0000 mg | ORAL_TABLET | ORAL | Status: AC
  Administered 2023-11-17: 75 mg via ORAL
  Filled 2023-11-17: qty 1

## 2023-11-17 NOTE — ED Triage Notes (Signed)
Pt to ED via [facility] from home with c/o constant stabbing central chest pain that was a 10/10 then an 8/10 and is now a 4/10. Pt recently seen and discharged from [facility] for a brachial artery dissection.   Per EMS pt had depression to lateral leads on EKG. EMS reports pt has had of CHF, anxiety and a-fib.  Per EMS pt started to have a panic attack upon arrival to ED.    74 HR 100% on chronic 2L 159/69

## 2023-11-17 NOTE — ED Notes (Signed)
See triage note  Presents with chest pain which started about 3 am  States the pain woke her up  Describes pain as stabbing

## 2023-11-17 NOTE — Discharge Instructions (Signed)
Your test today were all reassuring.  Continue taking all of your usual medications at home.  Pick up new prescriptions for Carafate and lower dose Prozac, and keep taking Protonix twice daily as prescribed.

## 2023-11-17 NOTE — ED Provider Notes (Signed)
[facility] Provider Note    Event Date/Time   First MD Initiated Contact with Patient 11/17/23 0945     (approximate)   History   Chief Complaint: Chest Pain   HPI  Linda Brady is a 82 y.o. female with a history of COPD on 2 L nasal cannula chronically, GERD, hypertension, atrial fibrillation who comes ED complaining of central chest pain described as aching, constant since about 2:00 AM today.  Better sitting upright, worse laying down flat.  No shortness of breath diaphoresis vomiting or radiation.  Pain has improved substantially since onset and is currently mild.  No exertional worsening.  Denies fever dizziness or palpitations.  Compliant with her medications but has not had them yet today.  Outside records reviewed noting that patient underwent cardiac catheterization procedure recently, experienced a iatrogenic dissection of her right brachial artery which was repaired by vascular surgery.  Currently on aspirin and Plavix and recommended to withhold DOAC for now by vascular surgery.        Physical Exam   Triage Vital Signs: ED Triage Vitals  Encounter Vitals Group     BP 11/17/23 0349 (!) 155/68     Systolic BP Percentile --      Diastolic BP Percentile --      Pulse Rate 11/17/23 0349 71     Resp 11/17/23 0349 (!) 26     Temp 11/17/23 0349 98 F (36.7 C)     Temp Source 11/17/23 0349 Oral     SpO2 11/17/23 0349 100 %     Weight 11/17/23 0350 179 lb (81.2 kg)     Height 11/17/23 0350 5\' 3"  (1.6 m)     Head Circumference --      Peak Flow --      Pain Score 11/17/23 0350 4     Pain Loc --      Pain Education --      Exclude from Growth Chart --     Most recent vital signs: Vitals:   11/17/23 1100 11/17/23 1126  BP: (!) 135/57   Pulse: 65   Resp: 19   Temp:  98 F (36.7 C)  SpO2: 100%     General: Awake, no distress.  CV:  Good peripheral perfusion.  Regular rate about 70 Resp:  Normal effort.  Clear to auscultation  bilaterally Abd:  No distention.  Soft with left upper quadrant  tenderness Other:  Nontoxic, normal distal pulses   ED Results / Procedures / Treatments   Labs (all labs ordered are listed, but only abnormal results are displayed) Labs Reviewed  BASIC METABOLIC PANEL - Abnormal; Notable for the following components:      Result Value   Sodium 130 (*)    Potassium 2.4 (*)    Chloride 91 (*)    Glucose, Bld 124 (*)    Creatinine, Ser 1.10 (*)    GFR, Estimated 51 (*)    All other components within normal limits  CBC - Abnormal; Notable for the following components:   RBC 3.49 (*)    Hemoglobin 10.9 (*)    HCT 32.8 (*)    Platelets 419 (*)    All other components within normal limits  TROPONIN I (HIGH SENSITIVITY) - Abnormal; Notable for the following components:   Troponin I (High Sensitivity) 21 (*)    All other components within normal limits  TROPONIN I (HIGH SENSITIVITY) - Abnormal; Notable for the following components:   Troponin I (  High Sensitivity) 19 (*)    All other components within normal limits  MAGNESIUM  LIPASE, BLOOD  HEPATIC FUNCTION PANEL     EKG Interpreted by me Sinus rhythm rate of 73.  Left axis, first-degree AV block.  Normal QRS.  Slight lateral ST depression which is unchanged compared to previous EKG on 11/04/2023.   RADIOLOGY Chest xray interpreted by me, unremarkable. Radiology report reviewed.   PROCEDURES:  Procedures   MEDICATIONS ORDERED IN ED: Medications  sucralfate (CARAFATE) tablet 1 g (1 g Oral Given 11/17/23 1112)  pantoprazole (PROTONIX) injection 40 mg (40 mg Intravenous Given 11/17/23 1112)  famotidine (PEPCID) tablet 40 mg (40 mg Oral Given 11/17/23 1112)  potassium chloride SA (KLOR-CON M) CR tablet 40 mEq (40 mEq Oral Given 11/17/23 1111)  amiodarone (PACERONE) tablet 200 mg (200 mg Oral Given 11/17/23 1114)  aspirin EC tablet 81 mg (81 mg Oral Given 11/17/23 1113)  clopidogrel (PLAVIX) tablet 75 mg (75 mg Oral Given  11/17/23 1111)  furosemide (LASIX) tablet 20 mg (20 mg Oral Given 11/17/23 1111)  isosorbide mononitrate (IMDUR) 24 hr tablet 30 mg (30 mg Oral Given 11/17/23 1112)     IMPRESSION / MDM / ASSESSMENT AND PLAN / ED COURSE  I reviewed the triage vital signs and the nursing notes.  DDx: Pneumonia, pneumothorax, non-STEMI, GERD/gastritis, electrolyte abnormality, pancreatitis  Patient's presentation is most consistent with acute presentation with potential threat to life or bodily function.  Patient presents with atypical chest discomfort, nondiagnostic EKG, unremarkable chest x-ray.  Exam is reassuring.  Seems that some of her medication she takes only intermittently including Protonix.  Will give her morning dose of medications along with Pepcid Protonix and Carafate.  Doubt ACS PE dissection pericardial effusion or mediastinitis  Patient also reports that she does not take her Prozac because she feels like side effects are too much.  I will prescribe 10 mg capsules to decrease her dose by half to see if this allows her to take the Prozac consistently and help alleviate her anxiety symptoms.       FINAL CLINICAL IMPRESSION(S) / ED DIAGNOSES   Final diagnoses:  Nonspecific chest pain  Gastroesophageal reflux disease, unspecified whether esophagitis present     Rx / DC Orders   ED Discharge Orders          Ordered    pantoprazole (PROTONIX) 40 MG tablet  2 times daily        11/17/23 1137    sucralfate (CARAFATE) 1 g tablet  4 times daily        11/17/23 1137    FLUoxetine (PROZAC) 10 MG capsule  Daily PRN        11/17/23 1137             Note:  This document was prepared using Dragon voice recognition software and may include unintentional dictation errors.   Sharman Cheek, MD 11/17/23 1146

## 2023-11-19 ENCOUNTER — Ambulatory Visit (HOSPITAL_COMMUNITY)
Admission: RE | Admit: 2023-11-19 | Discharge: 2023-11-19 | Disposition: A | Discharge status: Home / Self Care | Payer: Medicare Other | Source: Ambulatory Visit | Attending: Vascular Surgery | Admitting: Vascular Surgery

## 2023-11-19 DIAGNOSIS — I7776 Dissection of artery of upper extremity: Secondary | ICD-10-CM | POA: Diagnosis present

## 2023-11-19 NOTE — Progress Notes (Unsigned)
.    [address]       [place] [address]             [phone]           Linda Brady [place] Medical Record #[id number] Date of Birth: Mar 29, 1941  Linda Brady, Linda Brady Linda Brady, Linda Brady  Chief Complaint:   aortic stenosis  History of Present Illness:     Pt is an 82 yo woman who was admitted this fall with CHF and afib. At that time was noted to have aortic stenosis with a mean gradient of and with normal LV function. 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 She was felt to require O2 therapy chronically after that and pt feels inhalers no longer giving her relief for her COPD. She had no CAD at time of cath. She had CTA that had favorable anatomy for TAVR. PT was also just in ER this week with CP felt secondary to GERD and was discharged. Pt says she feels better.      Past Medical History:  Diagnosis Date   COPD (chronic obstructive pulmonary disease) (HCC)    GERD (gastroesophageal reflux disease)    Hypertension    Hypothyroidism    Thyroid disease     Past Surgical History:  Procedure Laterality Date   BREAST CYST ASPIRATION Left 10 plus yrs   benign-twice   CATARACT EXTRACTION W/PHACO Left 03/20/2023   Procedure: CATARACT EXTRACTION PHACO AND INTRAOCULAR LENS PLACEMENT (IOC) LEFT VISION BLUE;  Surgeon: Estanislado Pandy, Linda Brady;  Location: [facility];  Service: Ophthalmology;  Laterality: Left;  21.02   01:38.2   LEFT HEART CATH AND CORONARY ANGIOGRAPHY N/A 09/11/2020   Procedure: LEFT HEART CATH AND CORONARY ANGIOGRAPHY;  Surgeon: Marcina Millard, Linda Brady;  Location: [facility] INVASIVE CV LAB;  Service: Cardiovascular;  Laterality: N/A;   PERIPHERAL VASCULAR INTERVENTION Right 10/24/2023   Procedure: PERIPHERAL VASCULAR INTERVENTION;  Surgeon: Leonie Douglas, Linda Brady;  Location: [facility] INVASIVE CV LAB;  Service: Cardiovascular;   Laterality: Right;  right axillary stent   RIGHT/LEFT HEART CATH AND CORONARY ANGIOGRAPHY N/A 10/23/2023   Procedure: RIGHT/LEFT HEART CATH AND CORONARY ANGIOGRAPHY;  Surgeon: Linda Brady, Linda Brady;  Location: [facility] INVASIVE CV LAB;  Service: Cardiovascular;  Laterality: N/A;   UPPER EXTREMITY ANGIOGRAPHY Right 10/24/2023   Procedure: Upper Extremity Angiography;  Surgeon: Leonie Douglas, Linda Brady;  Location: [facility] INVASIVE CV LAB;  Service: Cardiovascular;  Laterality: Right;    Social History   Tobacco Use  Smoking Status Former   Current packs/day: 0.50   Average packs/day: 0.5 packs/day for 12.0 years (6.0 ttl pk-yrs)   Types: Cigarettes  Smokeless Tobacco Never  Tobacco Comments   7-8 cigarettes a day    Social History   Substance and Sexual Activity  Alcohol Use Not Currently    Social History   Socioeconomic History   Marital status: Widowed    Spouse name: Not on file   Number of children: Not on file   Years of education: Not on file   Highest education level: Not on file  Occupational History   Not on file  Tobacco Use   Smoking status: Former    Current packs/day: 0.50    Average packs/day: 0.5 packs/day for 12.0 years (6.0 ttl pk-yrs)    Types: Cigarettes   Smokeless tobacco: Never   Tobacco comments:    7-8  cigarettes a day  Vaping Use   Vaping status: Never Used  Substance and Sexual Activity   Alcohol use: Not Currently   Drug use: Not Currently   Sexual activity: Not Currently  Other Topics Concern   Not on file  Social History Narrative   Not on file   Social Drivers of Health   Financial Resource Strain: Low Risk  (09/30/2023)   Received from [facility]   Overall Financial Resource Strain (CARDIA)    Difficulty of Paying Living Expenses: Not hard at all  Food Insecurity: No Food Insecurity (10/24/2023)   Hunger Vital Sign    Worried About Running Out of Food in the Last Year: Never true    Ran Out of Food in the Last Year: Never  true  Transportation Needs: No Transportation Needs (10/24/2023)   PRAPARE - Administrator, Civil Service (Medical): No    Lack of Transportation (Non-Medical): No  Physical Activity: Not on file  Stress: Not on file  Social Connections: Not on file  Intimate Partner Violence: Not At Risk (10/24/2023)   Humiliation, Afraid, Rape, and Kick questionnaire    Fear of Current or Ex-Partner: No    Emotionally Abused: No    Physically Abused: No    Sexually Abused: No    Allergies  Allergen Reactions   Naproxen Sodium Anaphylaxis    Current Outpatient Medications  Medication Sig Dispense Refill   ALPRAZolam (XANAX) 0.25 MG tablet Take 0.25 mg by mouth at bedtime as needed for sleep. (Patient not taking: Reported on 11/04/2023)     amiodarone (PACERONE) 200 MG tablet Take 1 tablet (200 mg total) by mouth 2 (two) times daily. 60 tablet 0   aspirin EC 81 MG tablet Take 1 tablet (81 mg total) by mouth daily. Swallow whole. 30 tablet 12   atorvastatin (LIPITOR) 40 MG tablet Take 1 tablet (40 mg total) by mouth daily. 30 tablet 0   budesonide-formoterol (SYMBICORT) 160-4.5 MCG/ACT inhaler Inhale 2 puffs into the lungs 2 (two) times daily. 1 each 12   cholecalciferol (VITAMIN D3) 25 MCG (1000 UNIT) tablet Take 1,000 Units by mouth daily.     clopidogrel (PLAVIX) 75 MG tablet Take 1 tablet (75 mg total) by mouth daily for 28 days. 28 tablet 0   FLUoxetine (PROZAC) 10 MG capsule Take 2 capsules (20 mg total) by mouth daily as needed (anxiety). 30 capsule 0   furosemide (LASIX) 20 MG tablet Take 1 tablet (20 mg total) by mouth 2 (two) times daily. 90 tablet 3   Ipratropium-Albuterol (COMBIVENT RESPIMAT) 20-100 MCG/ACT AERS respimat Inhale 1 puff into the lungs every 6 (six) hours as needed for wheezing.     isosorbide mononitrate (IMDUR) 30 MG 24 hr tablet Take 1 tablet (30 mg total) by mouth daily. 30 tablet 1   levothyroxine (SYNTHROID) 75 MCG tablet Take 75 mcg by mouth daily.      pantoprazole (PROTONIX) 40 MG tablet Take 1 tablet (40 mg total) by mouth 2 (two) times daily. 60 tablet 1   potassium chloride SA (KLOR-CON M20) 20 MEQ tablet Take 2 tablets (40 mEq total) by mouth daily for 2 days. 4 tablet 0   rOPINIRole (REQUIP) 0.25 MG tablet Take 1 tablet (0.25 mg total) by mouth at bedtime. 30 tablet 0   sucralfate (CARAFATE) 1 g tablet Take 1 tablet (1 g total) by mouth 4 (four) times daily. 120 tablet 1   vitamin B-12 (CYANOCOBALAMIN) 1000 MCG tablet Take  1,000 mcg by mouth daily.     No current facility-administered medications for this visit.     Family History  Problem Relation Age of Onset   Breast cancer Neg Hx        Physical Exam: In wheel chair Lungs: clear Card: RR with 3/6 sem Ext:no edema Neuro: intact     Diagnostic Studies & Laboratory data: I have personally reviewed the following studies and agree with the findings   TTE (09/2023) IMPRESSIONS     1. Left ventricular ejection fraction, by estimation, is 65 to 70%. The  left ventricle has normal function. The left ventricle has no regional  wall motion abnormalities. Left ventricular diastolic parameters are  consistent with Grade II diastolic  dysfunction (pseudonormalization).   2. Right ventricular systolic function is normal. The right ventricular  size is normal. There is mildly elevated pulmonary artery systolic  pressure.   3. The mitral valve is degenerative. Mild mitral valve regurgitation.   4. The aortic valve is calcified. Aortic valve regurgitation is mild.  Severe aortic valve stenosis. Aortic valve area, by VTI measures 0.99 cm.  Aortic valve mean gradient measures 49.2 mmHg. Aortic valve Vmax measures  4.56 m/s.   5. The inferior vena cava is normal in size with greater than 50%  respiratory variability, suggesting right atrial pressure of 3 mmHg.   Conclusion(s)/Recommendation(s): Findings consistent with severe valvular  heart disease.   FINDINGS   Left  Ventricle: Left ventricular ejection fraction, by estimation, is 65  to 70%. The left ventricle has normal function. The left ventricle has no  regional wall motion abnormalities. The left ventricular internal cavity  size was normal in size. There is   no left ventricular hypertrophy. Left ventricular diastolic parameters  are consistent with Grade II diastolic dysfunction (pseudonormalization).   Right Ventricle: The right ventricular size is normal. No increase in  right ventricular wall thickness. Right ventricular systolic function is  normal. There is mildly elevated pulmonary artery systolic pressure. The  tricuspid regurgitant velocity is 2.71   m/s, and with an assumed right atrial pressure of 8 mmHg, the estimated  right ventricular systolic pressure is 37.4 mmHg.   Left Atrium: Left atrial size was normal in size.   Right Atrium: Right atrial size was normal in size.   Pericardium: There is no evidence of pericardial effusion.   Mitral Valve: The mitral valve is degenerative in appearance. Mild to  moderate mitral annular calcification. Mild mitral valve regurgitation. MV  peak gradient, 7.5 mmHg. The mean mitral valve gradient is 3.0 mmHg.   Tricuspid Valve: The tricuspid valve is normal in structure. Tricuspid  valve regurgitation is mild.   Aortic Valve: The aortic valve is calcified. Aortic valve regurgitation is  mild. Aortic regurgitation PHT measures 436 msec. Severe aortic stenosis  is present. Aortic valve mean gradient measures 49.2 mmHg. Aortic valve  peak gradient measures 83.1 mmHg.  Aortic valve area, by VTI measures 0.99 cm.   Pulmonic Valve: The pulmonic valve was not well visualized. Pulmonic valve  regurgitation is trivial.   Aorta: The aortic root and ascending aorta are structurally normal, with  no evidence of dilitation.   Venous: The inferior vena cava is normal in size with greater than 50%  respiratory variability, suggesting right atrial  pressure of 3 mmHg.   IAS/Shunts: No atrial level shunt detected by color flow Doppler.     LEFT VENTRICLE  PLAX 2D  LVIDd:  3.60 cm   Diastology  LVIDs:         2.40 cm   LV e' medial:    7.45 cm/s  LV PW:         1.30 cm   LV E/e' medial:  15.7  LV IVS:        1.30 cm   LV e' lateral:   6.92 cm/s  LVOT diam:     2.00 cm   LV E/e' lateral: 16.9  LV SV:         106  LV SV Index:   57  LVOT Area:     3.14 cm     RIGHT VENTRICLE  RV Basal diam:  3.25 cm  RV Mid diam:    2.90 cm  RV S prime:     12.60 cm/s  TAPSE (M-mode): 2.6 cm   LEFT ATRIUM             Index        RIGHT ATRIUM           Index  LA diam:        4.50 cm 2.42 cm/m   RA Area:     15.10 cm  LA Vol (A2C):   63.0 ml 33.90 ml/m  RA Volume:   40.70 ml  21.90 ml/m  LA Vol (A4C):   45.5 ml 24.48 ml/m  LA Biplane Vol: 56.5 ml 30.40 ml/m   AORTIC VALVE  AV Area (Vmax):    1.03 cm  AV Area (Vmean):   0.97 cm  AV Area (VTI):     0.99 cm  AV Vmax:           455.80 cm/s  AV Vmean:          322.600 cm/s  AV VTI:            1.079 m  AV Peak Grad:      83.1 mmHg  AV Mean Grad:      49.2 mmHg  LVOT Vmax:         150.00 cm/s  LVOT Vmean:        99.600 cm/s  LVOT VTI:          0.338 m  LVOT/AV VTI ratio: 0.31  AI PHT:            436 msec    AORTA  Ao Root diam: 3.10 cm  Ao Asc diam:  2.80 cm   MITRAL VALVE                TRICUSPID VALVE  MV Area (PHT): 2.90 cm     TR Peak grad:   29.4 mmHg  MV Area VTI:   2.62 cm     TR Vmax:        271.00 cm/s  MV Peak grad:  7.5 mmHg  MV Mean grad:  3.0 mmHg     SHUNTS  MV Vmax:       1.37 m/s     Systemic VTI:  0.34 m  MV Vmean:      79.3 cm/s    Systemic Diam: 2.00 cm  MV Decel Time: 262 msec  MV E velocity: 117.00 cm/s  MV A velocity: 96.90 cm/s  MV E/A ratio:  1.21   CATH (10/2023) Conclusion      Prox LAD lesion is 30% stenosed.   Ost RCA to Prox RCA lesion is 20% stenosed.   1.  Mild nonobstructive coronary artery disease. 2.  Fick cardiac output  of 9.0 L/min and Fick cardiac index of 4.9 L/min/m with the following hemodynamics             Right atrial pressure mean of 7 mmHg             Right ventricular pressure 48/27 with end-diastolic pressure of 14 mmHg             PA pressure 48/17 with a mean of 29 mmHg             Wedge pressure mean of 18 mmHg V waves to 27 mmHg             PVR 1.2 Woods units             PA pulsatility index of 4    Recent Radiology Findings:  CTA (10/2023) FINDINGS: Aortic Root:   Aortic valve: Tricuspid   Aortic valve calcium score: 1940   Aortic annulus:   Diameter: 24mm x 23mm   Perimeter: 71mm   Area: 365mm^2   Calcifications: Mild calcification adjacent to left coronary cusp   Coronary height: Min Left - 13mm; Min Right - 13mm   Sinotubular height: Left cusp - 20mm; Right cusp - 19mm; Noncoronary cusp - 20mm   LVOT (as measured 3 mm below the annulus):   Diameter: 24mm x 22mm   Area: 319mm^2   Calcifications: No calcifications   Aortic sinus width: Left cusp - 29mm; Right cusp - 29mm; Noncoronary cusp - 30mm   Sinotubular junction width: 28mm x 26mm   Optimum Fluoroscopic Angle for Delivery: LAO 13 CRA 4   Cardiac:   Right atrium: Normal size   Right ventricle: Normal size   Pulmonary arteries: Normal size   Pulmonary veins: Normal configuration   Left atrium: Mild enlargement   Left ventricle: Normal size   Pericardium: Normal thickness   Coronary arteries: Calcium score 232 (65th percentile)   IMPRESSION: 1. Tricuspid aortic valve with severe calcifications (AV calcium score 1940)   2. Aortic annulus measures 24mm x 23mm in diameter with perimeter 71mm and area 399mm^2. Mild annular calcifications adjacent to left coronary cusp. Annular measurements suitable for delivery of 23mm Edwards Sapien 3 valve.   3. Sufficient coronary to annulus distance, measures 13mm to left main and 13mm to RCA   4.  Optimum Fluoroscopic Angle for Delivery:   LAO 13  CRA 4   5.  Coronary calcium score 232 (65th percentile)    VAS [place] UPPER EXTREMITY ARTERIAL DUPLEX Result Date: 11/19/2023  UPPER EXTREMITY DUPLEX STUDY Patient Name:  Linda Brady  Date of Exam:   11/19/2023 Medical Rec #: [id number]          Accession #:    [id number] Date of Birth: [birth year]         Patient Gender: F Patient Age:   64 years Exam Location:  [facility] Procedure:      VAS [place] UPPER EXTREMITY ARTERIAL DUPLEX Referring Phys: Lemar Livings --------------------------------------------------------------------------------  Indications: Follow up. History:     Patient has a history of 10/24/23: Right axillary stent for              pseudoaneurysm repair.  Performing Technologist: Thereasa Parkin RVT  Examination Guidelines: A complete evaluation includes B-mode imaging, spectral Doppler, color Doppler, and power Doppler as needed of all accessible portions of each vessel. Bilateral testing is considered an integral part of a complete examination. Limited examinations for reoccurring indications may be  performed as noted.  Right Doppler Findings: +---------------+----------+----------------+--------+-------------+ Site           PSV (cm/s)Waveform        StenosisComments      +---------------+----------+----------------+--------+-------------+ Subclavian Mid 76        biphasic                              +---------------+----------+----------------+--------+-------------+ Subclavian Dist75        biphasic                              +---------------+----------+----------------+--------+-------------+ Axillary                                         stent         +---------------+----------+----------------+--------+-------------+ Brachial Prox  166       biphasic                              +---------------+----------+----------------+--------+-------------+ Brachial Mid   166       biphasic                               +---------------+----------+----------------+--------+-------------+ Brachial Dist  89        biphasic                              +---------------+----------+----------------+--------+-------------+ Radial Prox    38        monophasic                            +---------------+----------+----------------+--------+-------------+ Radial Mid     16        monophasic              pre occlusive +---------------+----------+----------------+--------+-------------+ Radial Dist              appears occluded                      +---------------+----------+----------------+--------+-------------+ Ulnar Prox     56        biphasic                              +---------------+----------+----------------+--------+-------------+ Ulnar Mid      104       biphasic                              +---------------+----------+----------------+--------+-------------+ Ulnar Dist     92        biphasic                              +---------------+----------+----------------+--------+-------------+  Right Stent(s): +---------------+--------+--------+---------+--------+ axillary art   PSV cm/sStenosisWaveform Comments +---------------+--------+--------+---------+--------+ Prox to Stent  88              biphasic          +---------------+--------+--------+---------+--------+ Proximal Stent 80              biphasic          +---------------+--------+--------+---------+--------+  Mid Stent      60              triphasic         +---------------+--------+--------+---------+--------+ Distal Stent   75              triphasic         +---------------+--------+--------+---------+--------+ Distal to [id number]             triphasic         +---------------+--------+--------+---------+--------+  Summary:  Right: Patent axillary artery stent.        Pre occlusive waveforms in the radial artery prox and mid and        no flow detected distally.        Patent ulnar artery with  biphasic waveforms. *See table(s) above for measurements and observations. Electronically signed by Lemar Livings Linda Brady on 11/19/2023 at 4:29:30 PM.    Final       Recent Lab Findings: Lab Results  Component Value Date   WBC 5.6 11/17/2023   HGB 10.9 (L) 11/17/2023   HCT 32.8 (L) 11/17/2023   PLT 419 (H) 11/17/2023   GLUCOSE 124 (H) 11/17/2023   CHOL 158 09/10/2020   TRIG 100 09/10/2020   HDL 53 09/10/2020   LDLCALC 85 09/10/2020   ALT 16 11/17/2023   AST 20 11/17/2023   NA 130 (L) 11/17/2023   K 2.4 (LL) 11/17/2023   CL 91 (L) 11/17/2023   CREATININE 1.10 (H) 11/17/2023   BUN 14 11/17/2023   CO2 28 11/17/2023   TSH 1.152 09/18/2023   INR 0.9 09/10/2020      Assessment / Plan:     82 yo female with NYHA class 2-3 symptoms of severe AS with normal LV function and without CAD however frail with advanced age, COPD, CRI with recent afib and CHF. Pt would benefit from TAVR and we discussed all the risks and goals and recovery from the procedure and she and her daughter understand and wish to proceed. Her anatomy favors a 23 sapien valve via the femoral access point. She is not a bail out candidate   I have spent 60 min in review of the records, viewing studies and in face to face with patient and in coordination of future care    Eugenio Hoes 11/19/2023 4:46 PM

## 2023-11-20 ENCOUNTER — Encounter: Payer: Self-pay | Admitting: Thoracic Surgery (Cardiothoracic Vascular Surgery)

## 2023-11-20 ENCOUNTER — Institutional Professional Consult (permissible substitution): Payer: Medicare Other | Admitting: Thoracic Surgery (Cardiothoracic Vascular Surgery)

## 2023-11-20 VITALS — BP 130/72 | HR 78 | Resp 20 | Ht 63.0 in | Wt 179.0 lb

## 2023-11-20 DIAGNOSIS — I350 Nonrheumatic aortic (valve) stenosis: Secondary | ICD-10-CM | POA: Diagnosis not present

## 2023-11-20 NOTE — Patient Instructions (Signed)
TAVR

## 2023-11-21 ENCOUNTER — Telehealth: Payer: Self-pay

## 2023-11-21 NOTE — Telephone Encounter (Signed)
Elmer Bales, PT with [facility] called with an update on pt's weight. The pt weighed 180 lbs upon d/c and is now 170 lbs. No issues at this time.

## 2023-11-24 ENCOUNTER — Encounter (HOSPITAL_COMMUNITY): Payer: Self-pay | Admitting: Certified Registered Nurse Anesthetist

## 2023-11-24 ENCOUNTER — Encounter (HOSPITAL_COMMUNITY): Payer: Self-pay | Admitting: Vascular Surgery

## 2023-11-24 ENCOUNTER — Other Ambulatory Visit: Payer: Self-pay

## 2023-11-24 DIAGNOSIS — I350 Nonrheumatic aortic (valve) stenosis: Secondary | ICD-10-CM

## 2023-11-24 NOTE — Progress Notes (Unsigned)
[facility] [place]  ASSESSMENT / PLAN: 82 y.o. female status post right brachial pseudoaneurysm repair with covered stenting 10/24/23. Good technical result on duplex.   CHIEF COMPLAINT: ***  HISTORY OF PRESENT ILLNESS: Linda Brady is a 82 y.o. female with multiple significant medical comorbidities including COPD on home O2, diastolic heart failure, severe aortic stenosis with a valve area less than 1.0 and a a mean gradient of 49 with peaks of . She underwent diagnostic right and left heart cath yesterday from a right IJ and right radial approach.  Postoperatively she was reporting right upper extremity pain but there was no evidence of hematoma and manual pressure was held.  She was told to continue to hold her Eliquis until Monday and was discharged home.  She presented back to the emergency department overnight with right upper extremity pain and bruising.  CTA was obtained which demonstrated a upper arm/axilla brachial artery pseudoaneurysm.  She did initially have some numbness down the arm but reports this is improved and is motor sensor intact. She underwent covered stenting of the lesion from a right common femoral and right radial approach on 10/24/23, which she tolerated well. She had significant improvement in symptoms post procedure.   VASCULAR SURGICAL HISTORY: ***  VASCULAR RISK FACTORS: {FINDINGS; POSITIVE [phone]} history of stroke / transient ischemic attack. {FINDINGS; POSITIVE [phone]} history of coronary artery disease. *** history of PCI. *** history of CABG.  {FINDINGS; POSITIVE [phone]} history of diabetes mellitus. Last A1c ***. {FINDINGS; POSITIVE [phone]} history of smoking. *** actively smoking. {FINDINGS; POSITIVE [phone]} history of hypertension. *** drug regimen with *** control. {FINDINGS; POSITIVE [phone]} history of chronic kidney disease.  Last GFR ***. CKD  {stage:30421363}. {FINDINGS; POSITIVE [phone]} history of chronic obstructive pulmonary disease, treated with ***.  FUNCTIONAL STATUS: ECOG performance status: {findings; ecog performance status:31780} Ambulatory status: {TNHAmbulation:25868}  CAREY 1 AND 3 YEAR INDEX Female (2pts) 75-79 or 80-84 (2pts) >84 (3pts) Dependence in toileting (1pt) Partial or full dependence in dressing (1pt) History of malignant neoplasm (2pts) CHF (3pts) COPD (1pts) CKD (3pts)  0-3 pts 6% 1 year mortality ; 21% 3 year mortality 4-5 pts 12% 1 year mortality ; 36% 3 year mortality >5 pts 21% 1 year mortality; 54% 3 year mortality   Past Medical History:  Diagnosis Date   COPD (chronic obstructive pulmonary disease) (HCC)    GERD (gastroesophageal reflux disease)    Hypertension    Hypothyroidism    Thyroid disease     Past Surgical History:  Procedure Laterality Date   BREAST CYST ASPIRATION Left 10 plus yrs   benign-twice   CATARACT EXTRACTION W/PHACO Left 03/20/2023   Procedure: CATARACT EXTRACTION PHACO AND INTRAOCULAR LENS PLACEMENT (IOC) LEFT VISION BLUE;  Surgeon: Estanislado Pandy, MD;  Location: [facility];  Service: Ophthalmology;  Laterality: Left;  21.02   01:38.2   LEFT HEART CATH AND CORONARY ANGIOGRAPHY N/A 09/11/2020   Procedure: LEFT HEART CATH AND CORONARY ANGIOGRAPHY;  Surgeon: Marcina Millard, MD;  Location: [facility] INVASIVE CV LAB;  Service: Cardiovascular;  Laterality: N/A;   PERIPHERAL VASCULAR INTERVENTION Right 10/24/2023   Procedure: PERIPHERAL VASCULAR INTERVENTION;  Surgeon: Leonie Douglas, MD;  Location: [facility] INVASIVE CV LAB;  Service: Cardiovascular;  Laterality: Right;  right axillary stent   RIGHT/LEFT HEART CATH AND CORONARY ANGIOGRAPHY N/A 10/23/2023   Procedure: RIGHT/LEFT HEART CATH AND CORONARY ANGIOGRAPHY;  Surgeon: Orbie Pyo, MD;  Location: [facility] INVASIVE CV LAB;  Service: Cardiovascular;  Laterality: N/A;  UPPER EXTREMITY  ANGIOGRAPHY Right 10/24/2023   Procedure: Upper Extremity Angiography;  Surgeon: Leonie Douglas, MD;  Location[facility] INV[facility] CV LAB;  Service: Cardiovascular;  Laterality: Right;    Family History  Problem Relation Age of Onset   Breast cancer Neg Hx     Social History   Socioeconomic History   Marital status: Widowed    Spouse name: Not on file   Number of children: Not on file   Years of education: Not on file   Highest education level: Not on file  Occupational History   Not on file  Tobacco Use   Smoking status: Former    Current packs/day: 0.50    Average packs/day: 0.5 packs/day for 12.0 years (6.0 ttl pk-yrs)    Types: Cigarettes   Smokeless tobacco: Never   Tobacco comments:    7-8 cigarettes a day  Vaping Use   Vaping status: Never Used  Substance and Sexual Activity   Alcohol use: Not Currently   Drug use: Not Currently   Sexual activity: Not Currently  Other Topics Concern   Not on file  Social History Narrative   Not on file   Social Drivers of Health   Financial Resource Strain: Low Risk  (09/30/2023)   Received from [facility]   Overall Financial Resource Strain (CARDIA)    Difficulty of Paying Living Expenses: Not hard at all  Food Insecurity: No Food Insecurity (10/24/2023)   Hunger Vital Sign    Worried About Running Out of Food in the Last Year: Never true    Ran Out of Food in the Last Year: Never true  Transportation Needs: No Transportation Needs (10/24/2023)   PRAPARE - Administrator, Civil Service (Medical): No    Lack of Transportation (Non-Medical): No  Physical Activity: Not on file  Stress: Not on file  Social Connections: Not on file  Intimate Partner Violence: Not At Risk (10/24/2023)   Humiliation, Afraid, Rape, and Kick questionnaire    Fear of Current or Ex-Partner: No    Emotionally Abused: No    Physically Abused: No    Sexually Abused: No    Allergies  Allergen Reactions   Naproxen  Sodium Anaphylaxis    Current Outpatient Medications  Medication Sig Dispense Refill   ALPRAZolam (XANAX) 0.25 MG tablet Take 0.25 mg by mouth at bedtime as needed for sleep.     amiodarone (PACERONE) 200 MG tablet Take 1 tablet (200 mg total) by mouth 2 (two) times daily. 60 tablet 0   aspirin EC 81 MG tablet Take 1 tablet (81 mg total) by mouth daily. Swallow whole. 30 tablet 12   atorvastatin (LIPITOR) 40 MG tablet Take 1 tablet (40 mg total) by mouth daily. 30 tablet 0   budesonide-formoterol (SYMBICORT) 160-4.5 MCG/ACT inhaler Inhale 2 puffs into the lungs 2 (two) times daily. 1 each 12   cholecalciferol (VITAMIN D3) 25 MCG (1000 UNIT) tablet Take 1,000 Units by mouth daily.     clopidogrel (PLAVIX) 75 MG tablet Take 1 tablet (75 mg total) by mouth daily for 28 days. 28 tablet 0   FLUoxetine (PROZAC) 10 MG capsule Take 2 capsules (20 mg total) by mouth daily as needed (anxiety). 30 capsule 0   furosemide (LASIX) 20 MG tablet Take 1 tablet (20 mg total) by mouth 2 (two) times daily. 90 tablet 3   Ipratropium-Albuterol (COMBIVENT RESPIMAT) 20-100 MCG/ACT AERS respimat Inhale 1 puff into the lungs every 6 (six) hours as  needed for wheezing.     isosorbide mononitrate (IMDUR) 30 MG 24 hr tablet Take 1 tablet (30 mg total) by mouth daily. 30 tablet 1   levothyroxine (SYNTHROID) 75 MCG tablet Take 75 mcg by mouth daily.     pantoprazole (PROTONIX) 40 MG tablet Take 1 tablet (40 mg total) by mouth 2 (two) times daily. 60 tablet 1   potassium chloride SA (KLOR-CON M20) 20 MEQ tablet Take 2 tablets (40 mEq total) by mouth daily for 2 days. 4 tablet 0   rOPINIRole (REQUIP) 0.25 MG tablet Take 1 tablet (0.25 mg total) by mouth at bedtime. 30 tablet 0   sucralfate (CARAFATE) 1 g tablet Take 1 tablet (1 g total) by mouth 4 (four) times daily. 120 tablet 1   vitamin B-12 (CYANOCOBALAMIN) 1000 MCG tablet Take 1,000 mcg by mouth daily.     No current facility-administered medications for this visit.     PHYSICAL EXAM There were no vitals filed for this visit.  Constitutional: *** appearing. *** distress. Appears *** nourished.  Neurologic: CN ***. *** focal findings. *** sensory loss. Psychiatric: *** Mood and affect symmetric and appropriate. Eyes: *** No icterus. No conjunctival pallor. Ears, nose, throat: *** mucous membranes moist. Midline trachea.  Cardiac: *** rate and rhythm.  Respiratory: *** unlabored. Abdominal: *** soft, non-tender, non-distended.  Peripheral vascular: *** Extremity: *** edema. *** cyanosis. *** pallor.  Skin: *** gangrene. *** ulceration.  Lymphatic: *** Stemmer's sign. *** palpable lymphadenopathy.    PERTINENT LABORATORY AND RADIOLOGIC DATA  Most recent CBC    Latest Ref Rng & Units 11/17/2023    3:56 AM 11/04/2023    8:59 AM 10/28/2023    3:26 AM  CBC  WBC 4.0 - 10.5 K/uL 5.6  6.8  7.6   Hemoglobin 12.0 - 15.0 g/dL 09.8  9.5  7.9   Hematocrit 36.0 - 46.0 % 32.8  28.0  24.5   Platelets 150 - 400 K/uL 419  585  341      Most recent CMP    Latest Ref Rng & Units 11/17/2023    7:26 AM 11/17/2023    3:56 AM 11/04/2023    8:59 AM  CMP  Glucose 70 - 99 mg/dL  119  95   BUN 8 - 23 mg/dL  14  9   Creatinine 1.47 - 1.00 mg/dL  8.29  5.62   Sodium 130 - 145 mmol/L  130  137   Potassium 3.5 - 5.1 mmol/L  2.4  4.1   Chloride 98 - 111 mmol/L  91  90   CO2 22 - 32 mmol/L  28  30   Calcium 8.9 - 10.3 mg/dL  9.4  9.9   Total Protein 6.5 - 8.1 g/dL 6.8     Total Bilirubin <1.2 mg/dL 0.5     Alkaline Phos 38 - 126 U/L 63     AST 15 - 41 U/L 20     ALT 0 - 44 U/L 16       Renal function Estimated Creatinine Clearance: 41.1 mL/min (A) (by C-G formula based on SCr of 1.1 mg/dL (H)).  No results found for: "HGBA1C"  LDL Cholesterol  Date Value Ref Range Status  09/10/2020 85 0 - 99 mg/dL Final    Comment:           Total Cholesterol/HDL:CHD Risk Coronary Heart Disease Risk Table  Men   Women  1/2 Average Risk   3.4    3.3  Average Risk       5.0   4.4  2 X Average Risk   9.6   7.1  3 X Average Risk  23.4   11.0        Use the calculated Patient Ratio above and the CHD Risk Table to determine the patient's CHD Risk.        ATP III CLASSIFICATION (LDL):  <100     mg/dL   Optimal  409-811  mg/dL   Near or Above                    Optimal  130-159  mg/dL   Borderline  914-782  mg/dL   High  >956     mg/dL   Very High Performed at [facility], [address], [place], [place] [address]      Vascular Imaging: ***  Rande Brunt. Lenell Antu, MD FACS [facility] Phone Number: [phone] 11/24/2023 8:57 PM   Total time spent on preparing this encounter including chart review, data review, collecting history, examining the patient, coordinating care for this {tnhtimebilling:26202}  Portions of this report may have been transcribed using voice recognition software.  Every effort has been made to ensure accuracy; however, inadvertent computerized transcription errors may still be present.

## 2023-11-25 ENCOUNTER — Ambulatory Visit (INDEPENDENT_AMBULATORY_CARE_PROVIDER_SITE_OTHER): Payer: Medicare Other | Admitting: Vascular Surgery

## 2023-11-25 ENCOUNTER — Encounter: Payer: Self-pay | Admitting: Vascular Surgery

## 2023-11-25 VITALS — BP 137/79 | HR 71 | Temp 97.9°F | Resp 20 | Ht 63.0 in | Wt 171.0 lb

## 2023-11-25 DIAGNOSIS — Z9889 Other specified postprocedural states: Secondary | ICD-10-CM | POA: Diagnosis not present

## 2023-11-28 ENCOUNTER — Ambulatory Visit (HOSPITAL_COMMUNITY)
Admission: RE | Admit: 2023-11-28 | Discharge: 2023-11-28 | Disposition: A | Discharge status: Home / Self Care | Payer: Medicare Other | Source: Ambulatory Visit | Attending: Cardiovascular Disease | Admitting: Cardiovascular Disease

## 2023-11-28 ENCOUNTER — Telehealth: Payer: Self-pay | Admitting: Cardiology

## 2023-11-28 ENCOUNTER — Encounter (HOSPITAL_COMMUNITY)
Admission: RE | Admit: 2023-11-28 | Discharge: 2023-11-28 | Disposition: A | Discharge status: Home / Self Care | Payer: Medicare Other | Source: Ambulatory Visit | Attending: Cardiovascular Disease | Admitting: Cardiovascular Disease

## 2023-11-28 ENCOUNTER — Other Ambulatory Visit: Payer: Self-pay | Admitting: Physician Assistant

## 2023-11-28 ENCOUNTER — Other Ambulatory Visit: Payer: Self-pay

## 2023-11-28 ENCOUNTER — Other Ambulatory Visit: Payer: Self-pay | Admitting: Cardiology

## 2023-11-28 DIAGNOSIS — I350 Nonrheumatic aortic (valve) stenosis: Secondary | ICD-10-CM | POA: Diagnosis not present

## 2023-11-28 DIAGNOSIS — J984 Other disorders of lung: Secondary | ICD-10-CM | POA: Insufficient documentation

## 2023-11-28 DIAGNOSIS — Z01818 Encounter for other preprocedural examination: Secondary | ICD-10-CM | POA: Diagnosis not present

## 2023-11-28 DIAGNOSIS — E876 Hypokalemia: Secondary | ICD-10-CM

## 2023-11-28 DIAGNOSIS — Z952 Presence of prosthetic heart valve: Secondary | ICD-10-CM

## 2023-11-28 DIAGNOSIS — Z01812 Encounter for preprocedural laboratory examination: Secondary | ICD-10-CM | POA: Diagnosis present

## 2023-11-28 DIAGNOSIS — Z01810 Encounter for preprocedural cardiovascular examination: Secondary | ICD-10-CM | POA: Diagnosis present

## 2023-11-28 LAB — PROTIME-INR
INR: 1 (ref 0.8–1.2)
Prothrombin Time: 13.5 s (ref 11.4–15.2)

## 2023-11-28 LAB — CBC
HCT: 34.1 % — ABNORMAL LOW (ref 36.0–46.0)
Hemoglobin: 10.9 g/dL — ABNORMAL LOW (ref 12.0–15.0)
MCH: 30.4 pg (ref 26.0–34.0)
MCHC: 32 g/dL (ref 30.0–36.0)
MCV: 95 fL (ref 80.0–100.0)
Platelets: 343 10*3/uL (ref 150–400)
RBC: 3.59 MIL/uL — ABNORMAL LOW (ref 3.87–5.11)
RDW: 14.5 % (ref 11.5–15.5)
WBC: 5 10*3/uL (ref 4.0–10.5)
nRBC: 0 % (ref 0.0–0.2)

## 2023-11-28 LAB — COMPREHENSIVE METABOLIC PANEL
ALT: 20 U/L (ref 0–44)
AST: 27 U/L (ref 15–41)
Albumin: 3.4 g/dL — ABNORMAL LOW (ref 3.5–5.0)
Alkaline Phosphatase: 78 U/L (ref 38–126)
Anion gap: 12 (ref 5–15)
BUN: 8 mg/dL (ref 8–23)
CO2: 27 mmol/L (ref 22–32)
Calcium: 9.3 mg/dL (ref 8.9–10.3)
Chloride: 94 mmol/L — ABNORMAL LOW (ref 98–111)
Creatinine, Ser: 0.92 mg/dL (ref 0.44–1.00)
GFR, Estimated: >60 mL/min (ref >60)
Glucose, Bld: 104 mg/dL — ABNORMAL HIGH (ref 70–99)
Potassium: 2.6 mmol/L — CL (ref 3.5–5.1)
Sodium: 133 mmol/L — ABNORMAL LOW (ref 135–145)
Total Bilirubin: 0.6 mg/dL (ref <1.2)
Total Protein: 6.9 g/dL (ref 6.5–8.1)

## 2023-11-28 LAB — URINALYSIS, ROUTINE W REFLEX MICROSCOPIC
Bilirubin Urine: NEGATIVE
Glucose, UA: NEGATIVE mg/dL
Hgb urine dipstick: NEGATIVE
Ketones, ur: NEGATIVE mg/dL
Leukocytes,Ua: NEGATIVE
Nitrite: NEGATIVE
Protein, ur: NEGATIVE mg/dL
Specific Gravity, Urine: 1.005 (ref 1.005–1.030)
pH: 8 (ref 5.0–8.0)

## 2023-11-28 LAB — TYPE AND SCREEN
ABO/RH(D): A POS
Antibody Screen: NEGATIVE

## 2023-11-28 LAB — SARS CORONAVIRUS 2 (TAT 6-24 HRS): SARS Coronavirus 2: NEGATIVE

## 2023-11-28 LAB — SURGICAL PCR SCREEN
MRSA, PCR: NEGATIVE
Staphylococcus aureus: NEGATIVE

## 2023-11-28 MED ORDER — POTASSIUM CHLORIDE CRYS ER 20 MEQ PO TBCR: 40.0000 meq | EXTENDED_RELEASE_TABLET | Freq: Two times a day (BID) | ORAL | 2 refills | Status: DC

## 2023-11-28 MED ORDER — AMIODARONE HCL 200 MG PO TABS: 200.0000 mg | ORAL_TABLET | Freq: Two times a day (BID) | ORAL | 0 refills | Status: DC

## 2023-11-28 NOTE — Progress Notes (Signed)
Julieta Gutting, RN with TAVR team made aware of patients critical potassium result 2.6

## 2023-11-28 NOTE — Progress Notes (Signed)
Patient contacted after pre-admission labs showed a K+ of 2.6. Spoke to the patients daughter for explicit instructions to start today (2 tabs this evening), then she will take twice daily tomorrow (12/28), then return to (2 tabs) once daily until her procedure. Plan to obtain BMET on arrival 12/31 for re-evaluation.   Georgie Chard NP-C Structural Heart Team  Phone: [phone]

## 2023-11-28 NOTE — Telephone Encounter (Signed)
*  STAT* If patient is at the pharmacy, call can be transferred to refill team.   1. Which medications need to be refilled? (please list name of each medication and dose if known)   amiodarone (PACERONE) 200 MG tablet (Expired)   2. Would you like to learn more about the convenience, safety, & potential cost savings by using the [facility]?   3. Are you open to using the [facility] (Type [facility]. ).  4. Which pharmacy/location (including street and city if local pharmacy) is medication to be sent to?  [facility] [address], [place] - [address]   5. Do they need a 30 day or 90 day supply?   90 day  Caller Morrie Sheldon) stated she is completely out of this medication.

## 2023-11-28 NOTE — Telephone Encounter (Signed)
Requested Prescriptions   Signed Prescriptions Disp Refills   amiodarone (PACERONE) 200 MG tablet 180 tablet 0    Sig: Take 1 tablet (200 mg total) by mouth 2 (two) times daily.    Authorizing Provider: Debbe Odea    Ordering User: Guerry Minors    last office visit: 11/04/23 with plan to f/u on 01/02/24 --Continue amiodarone 200mg  daily.

## 2023-11-28 NOTE — Progress Notes (Signed)
Patient signed all consents at PAT lab appointment. CHG soap and instructions were given to patient. CHG surgical prep reviewed with patient and all questions answered.  Patients chart send to anesthesia for review. Pt denies any respiratory illness/infection in the last two months.   Instructions also reviewed with patients daughter, Glennis Brink, and copy of paperwork sent home with her as well. All questions answered.

## 2023-12-01 ENCOUNTER — Encounter: Admitting: Thoracic Surgery (Cardiothoracic Vascular Surgery)

## 2023-12-01 MED ORDER — HEPARIN 30,000 UNITS/1000 ML (OHS) CELLSAVER SOLUTION
Status: DC
  Filled 2023-12-01 (×2): qty 1000

## 2023-12-01 MED ORDER — CEFAZOLIN SODIUM-DEXTROSE 2-4 GM/100ML-% IV SOLN
2.0000 g | INTRAVENOUS | Status: DC
  Filled 2023-12-01 (×2): qty 100

## 2023-12-01 MED ORDER — NOREPINEPHRINE 4 MG/250ML-% IV SOLN
0.0000 ug/min | INTRAVENOUS | Status: DC
Start: 2023-12-02 — End: 2023-12-03
  Filled 2023-12-01: qty 250

## 2023-12-01 MED ORDER — POTASSIUM CHLORIDE 2 MEQ/ML IV SOLN
80.0000 meq | INTRAVENOUS | Status: DC
  Filled 2023-12-01 (×2): qty 40

## 2023-12-01 MED ORDER — MAGNESIUM SULFATE 50 % IJ SOLN
40.0000 meq | INTRAMUSCULAR | Status: DC
Start: 2023-12-02 — End: 2023-12-03
  Filled 2023-12-01 (×2): qty 9.85

## 2023-12-01 MED ORDER — DEXMEDETOMIDINE HCL IN NACL 400 MCG/100ML IV SOLN
0.1000 ug/kg/h | INTRAVENOUS | Status: DC
  Filled 2023-12-01: qty 100

## 2023-12-01 NOTE — H&P (Signed)
.    [address]       [place] [address]             [phone]                                   Linda Brady Marvell Medical Record #[id number] Date of Birth: [birth year]   Linda Pyo, MD [name], MD   Chief Complaint:   aortic stenosis   History of Present Illness:     Pt is an 82 yo woman who was admitted this fall with CHF and afib. At that time was noted to have aortic stenosis with a mean gradient of and with normal LV function. 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 She was felt to require O2 therapy chronically after that and pt feels inhalers no longer giving her relief for her COPD. She had no CAD at time of cath. She had CTA that had favorable anatomy for TAVR. PT was also just in ER this week with CP felt secondary to GERD and was discharged. Pt says she feels better.             Past Medical History:  Diagnosis Date   COPD (chronic obstructive pulmonary disease) (HCC)     GERD (gastroesophageal reflux disease)     Hypertension     Hypothyroidism     Thyroid disease                 Past Surgical History:  Procedure Laterality Date   BREAST CYST ASPIRATION Left 10 plus yrs    benign-twice   CATARACT EXTRACTION W/PHACO Left 03/20/2023    Procedure: CATARACT EXTRACTION PHACO AND INTRAOCULAR LENS PLACEMENT (IOC) LEFT VISION BLUE;  Surgeon: Estanislado Pandy, MD;  Location: [facility];  Service: Ophthalmology;  Laterality: Left;  21.02   01:38.2   LEFT HEART CATH AND CORONARY ANGIOGRAPHY N/A 09/11/2020    Procedure: LEFT HEART CATH AND CORONARY ANGIOGRAPHY;  Surgeon: Marcina Millard, MD;  Location: [facility] INVASIVE CV LAB;  Service: Cardiovascular;  Laterality: N/A;   PERIPHERAL VASCULAR INTERVENTION Right 10/24/2023    Procedure: PERIPHERAL VASCULAR INTERVENTION;  Surgeon: Leonie Douglas, MD;   Location: [facility]ASIVE CV [facility];  Service: Cardiovascular;  Laterality: Right;  right axillary stent   RIGHT/LEFT HEART CATH AND CORONARY ANGIOGRAPHY N/A 10/23/2023    Procedure: RIGHT/LEFT HEART CATH AND CORONARY ANGIOGRAPHY;  Surgeon: Linda Pyo, MD;  Location: [facility]VASIVE CV [facility];  Service: Cardiovascular;  Laterality: N/A;   UPPER EXTREMITY ANGIOGRAPHY Right 10/24/2023    Procedure: Upper Extremity Angiography;  Surgeon: Leonie Douglas, MD;  Location: [facility] INVASIVE CV LAB;  Service: Cardiovascular;  Laterality: Right;          Tobacco Use History  Social History        Tobacco Use  Smoking Status Former   Current packs/day: 0.50   Average packs/day: 0.5 packs/day for 12.0 years (6.0 ttl pk-yrs)   Types: Cigarettes  Smokeless Tobacco Never  Tobacco Comments    7-8 cigarettes a day      Social History       Substance and Sexual Activity  Alcohol Use Not Currently      Social History         Socioeconomic History  Marital status: Widowed      Spouse name: Not on file   Number of children: Not on file   Years of education: Not on file   Highest education level: Not on file  Occupational History   Not on file  Tobacco Use   Smoking status: Former      Current packs/day: 0.50      Average packs/day: 0.5 packs/day for 12.0 years (6.0 ttl pk-yrs)      Types: Cigarettes   Smokeless tobacco: Never   Tobacco comments:      7-8 cigarettes a day  Vaping Use   Vaping status: Never Used  Substance and Sexual Activity   Alcohol use: Not Currently   Drug use: Not Currently   Sexual activity: Not Currently  Other Topics Concern   Not on file  Social History Narrative   Not on file    Social Drivers of Health        Financial Resource Strain: Low Risk  (09/30/2023)    Received from [facility]    Overall Financial Resource Strain (CARDIA)     Difficulty of Paying Living Expenses: Not hard at all  Food Insecurity: No Food Insecurity (10/24/2023)     Hunger Vital Sign     Worried About Running Out of Food in the Last Year: Never true     Ran Out of Food in the Last Year: Never true  Transportation Needs: No Transportation Needs (10/24/2023)    PRAPARE - Therapist, art (Medical): No     Lack of Transportation (Non-Medical): No  Physical Activity: Not on file  Stress: Not on file  Social Connections: Not on file  Intimate Partner Violence: Not At Risk (10/24/2023)    Humiliation, Afraid, Rape, and Kick questionnaire     Fear of Current or Ex-Partner: No     Emotionally Abused: No     Physically Abused: No     Sexually Abused: No      Allergies      Allergies  Allergen Reactions   Naproxen Sodium Anaphylaxis              Current Outpatient Medications  Medication Sig Dispense Refill   ALPRAZolam (XANAX) 0.25 MG tablet Take 0.25 mg by mouth at bedtime as needed for sleep. (Patient not taking: Reported on 11/04/2023)       amiodarone (PACERONE) 200 MG tablet Take 1 tablet (200 mg total) by mouth 2 (two) times daily. 60 tablet 0   aspirin EC 81 MG tablet Take 1 tablet (81 mg total) by mouth daily. Swallow whole. 30 tablet 12   atorvastatin (LIPITOR) 40 MG tablet Take 1 tablet (40 mg total) by mouth daily. 30 tablet 0   budesonide-formoterol (SYMBICORT) 160-4.5 MCG/ACT inhaler Inhale 2 puffs into the lungs 2 (two) times daily. 1 each 12   cholecalciferol (VITAMIN D3) 25 MCG (1000 UNIT) tablet Take 1,000 Units by mouth daily.       clopidogrel (PLAVIX) 75 MG tablet Take 1 tablet (75 mg total) by mouth daily for 28 days. 28 tablet 0   FLUoxetine (PROZAC) 10 MG capsule Take 2 capsules (20 mg total) by mouth daily as needed (anxiety). 30 capsule 0   furosemide (LASIX) 20 MG tablet Take 1 tablet (20 mg total) by mouth 2 (two) times daily. 90 tablet 3   Ipratropium-Albuterol (COMBIVENT RESPIMAT) 20-100 MCG/ACT AERS respimat Inhale 1 puff into the lungs every 6 (six) hours as needed  for wheezing.        isosorbide mononitrate (IMDUR) 30 MG 24 hr tablet Take 1 tablet (30 mg total) by mouth daily. 30 tablet 1   levothyroxine (SYNTHROID) 75 MCG tablet Take 75 mcg by mouth daily.       pantoprazole (PROTONIX) 40 MG tablet Take 1 tablet (40 mg total) by mouth 2 (two) times daily. 60 tablet 1   potassium chloride SA (KLOR-CON M20) 20 MEQ tablet Take 2 tablets (40 mEq total) by mouth daily for 2 days. 4 tablet 0   rOPINIRole (REQUIP) 0.25 MG tablet Take 1 tablet (0.25 mg total) by mouth at bedtime. 30 tablet 0   sucralfate (CARAFATE) 1 g tablet Take 1 tablet (1 g total) by mouth 4 (four) times daily. 120 tablet 1   vitamin B-12 (CYANOCOBALAMIN) 1000 MCG tablet Take 1,000 mcg by mouth daily.          No current facility-administered medications for this visit.               Family History  Problem Relation Age of Onset   Breast cancer Neg Hx                  Physical Exam: In wheel chair Lungs: clear Card: RR with 3/6 sem Ext:no edema Neuro: intact         Diagnostic Studies & Laboratory data: I have personally reviewed the following studies and agree with the findings   TTE (09/2023) IMPRESSIONS     1. Left ventricular ejection fraction, by estimation, is 65 to 70%. The  left ventricle has normal function. The left ventricle has no regional  wall motion abnormalities. Left ventricular diastolic parameters are  consistent with Grade II diastolic  dysfunction (pseudonormalization).   2. Right ventricular systolic function is normal. The right ventricular  size is normal. There is mildly elevated pulmonary artery systolic  pressure.   3. The mitral valve is degenerative. Mild mitral valve regurgitation.   4. The aortic valve is calcified. Aortic valve regurgitation is mild.  Severe aortic valve stenosis. Aortic valve area, by VTI measures 0.99 cm.  Aortic valve mean gradient measures 49.2 mmHg. Aortic valve Vmax measures  4.56 m/s.   5. The inferior vena cava is normal in  size with greater than 50%  respiratory variability, suggesting right atrial pressure of 3 mmHg.   Conclusion(s)/Recommendation(s): Findings consistent with severe valvular  heart disease.   FINDINGS   Left Ventricle: Left ventricular ejection fraction, by estimation, is 65  to 70%. The left ventricle has normal function. The left ventricle has no  regional wall motion abnormalities. The left ventricular internal cavity  size was normal in size. There is   no left ventricular hypertrophy. Left ventricular diastolic parameters  are consistent with Grade II diastolic dysfunction (pseudonormalization).   Right Ventricle: The right ventricular size is normal. No increase in  right ventricular wall thickness. Right ventricular systolic function is  normal. There is mildly elevated pulmonary artery systolic pressure. The  tricuspid regurgitant velocity is 2.71   m/s, and with an assumed right atrial pressure of 8 mmHg, the estimated  right ventricular systolic pressure is 37.4 mmHg.   Left Atrium: Left atrial size was normal in size.   Right Atrium: Right atrial size was normal in size.   Pericardium: There is no evidence of pericardial effusion.   Mitral Valve: The mitral valve is degenerative in appearance. Mild to  moderate mitral annular calcification. Mild mitral valve regurgitation.  MV  peak gradient, 7.5 mmHg. The mean mitral valve gradient is 3.0 mmHg.   Tricuspid Valve: The tricuspid valve is normal in structure. Tricuspid  valve regurgitation is mild.   Aortic Valve: The aortic valve is calcified. Aortic valve regurgitation is  mild. Aortic regurgitation PHT measures 436 msec. Severe aortic stenosis  is present. Aortic valve mean gradient measures 49.2 mmHg. Aortic valve  peak gradient measures 83.1 mmHg.  Aortic valve area, by VTI measures 0.99 cm.   Pulmonic Valve: The pulmonic valve was not well visualized. Pulmonic valve  regurgitation is trivial.   Aorta: The  aortic root and ascending aorta are structurally normal, with  no evidence of dilitation.   Venous: The inferior vena cava is normal in size with greater than 50%  respiratory variability, suggesting right atrial pressure of 3 mmHg.   IAS/Shunts: No atrial level shunt detected by color flow Doppler.     LEFT VENTRICLE  PLAX 2D  LVIDd:         3.60 cm   Diastology  LVIDs:         2.40 cm   LV e' medial:    7.45 cm/s  LV PW:         1.30 cm   LV E/e' medial:  15.7  LV IVS:        1.30 cm   LV e' lateral:   6.92 cm/s  LVOT diam:     2.00 cm   LV E/e' lateral: 16.9  LV SV:         106  LV SV Index:   57  LVOT Area:     3.14 cm     RIGHT VENTRICLE  RV Basal diam:  3.25 cm  RV Mid diam:    2.90 cm  RV S prime:     12.60 cm/s  TAPSE (M-mode): 2.6 cm   LEFT ATRIUM             Index        RIGHT ATRIUM           Index  LA diam:        4.50 cm 2.42 cm/m   RA Area:     15.10 cm  LA Vol (A2C):   63.0 ml 33.90 ml/m  RA Volume:   40.70 ml  21.90 ml/m  LA Vol (A4C):   45.5 ml 24.48 ml/m  LA Biplane Vol: 56.5 ml 30.40 ml/m   AORTIC VALVE  AV Area (Vmax):    1.03 cm  AV Area (Vmean):   0.97 cm  AV Area (VTI):     0.99 cm  AV Vmax:           455.80 cm/s  AV Vmean:          322.600 cm/s  AV VTI:            1.079 m  AV Peak Grad:      83.1 mmHg  AV Mean Grad:      49.2 mmHg  LVOT Vmax:         150.00 cm/s  LVOT Vmean:        99.600 cm/s  LVOT VTI:          0.338 m  LVOT/AV VTI ratio: 0.31  AI PHT:            436 msec    AORTA  Ao Root diam: 3.10 cm  Ao Asc diam:  2.80 cm   MITRAL VALVE  TRICUSPID VALVE  MV Area (PHT): 2.90 cm     TR Peak grad:   29.4 mmHg  MV Area VTI:   2.62 cm     TR Vmax:        271.00 cm/s  MV Peak grad:  7.5 mmHg  MV Mean grad:  3.0 mmHg     SHUNTS  MV Vmax:       1.37 m/s     Systemic VTI:  0.34 m  MV Vmean:      79.3 cm/s    Systemic Diam: 2.00 cm  MV Decel Time: 262 msec  MV E velocity: 117.00 cm/s  MV A velocity: 96.90 cm/s   MV E/A ratio:  1.21    CATH (10/2023) Conclusion       Prox LAD lesion is 30% stenosed.   Ost RCA to Prox RCA lesion is 20% stenosed.   1.  Mild nonobstructive coronary artery disease. 2.  Fick cardiac output of 9.0 L/min and Fick cardiac index of 4.9 L/min/m with the following hemodynamics             Right atrial pressure mean of 7 mmHg             Right ventricular pressure 48/27 with end-diastolic pressure of 14 mmHg             PA pressure 48/17 with a mean of 29 mmHg             Wedge pressure mean of 18 mmHg V waves to 27 mmHg             PVR 1.2 Woods units             PA pulsatility index of 4    Recent Radiology Findings:  CTA (10/2023) FINDINGS: Aortic Root:   Aortic valve: Tricuspid   Aortic valve calcium score: 1940   Aortic annulus:   Diameter: 24mm x 23mm   Perimeter: 71mm   Area: 324mm^2   Calcifications: Mild calcification adjacent to left coronary cusp   Coronary height: Min Left - 13mm; Min Right - 13mm   Sinotubular height: Left cusp - 20mm; Right cusp - 19mm; Noncoronary cusp - 20mm   LVOT (as measured 3 mm below the annulus):   Diameter: 24mm x 22mm   Area: 319mm^2   Calcifications: No calcifications   Aortic sinus width: Left cusp - 29mm; Right cusp - 29mm; Noncoronary cusp - 30mm   Sinotubular junction width: 28mm x 26mm   Optimum Fluoroscopic Angle for Delivery: LAO 13 CRA 4   Cardiac:   Right atrium: Normal size   Right ventricle: Normal size   Pulmonary arteries: Normal size   Pulmonary veins: Normal configuration   Left atrium: Mild enlargement   Left ventricle: Normal size   Pericardium: Normal thickness   Coronary arteries: Calcium score 232 (65th percentile)   IMPRESSION: 1. Tricuspid aortic valve with severe calcifications (AV calcium score 1940)   2. Aortic annulus measures 24mm x 23mm in diameter with perimeter 71mm and area 340mm^2. Mild annular calcifications adjacent to left coronary cusp. Annular  measurements suitable for delivery of 23mm Edwards Sapien 3 valve.   3. Sufficient coronary to annulus distance, measures 13mm to left main and 13mm to RCA   4.  Optimum Fluoroscopic Angle for Delivery:   LAO 13 CRA 4   5.  Coronary calcium score 232 (65th percentile)     Imaging Results (Last 48 hours)  VAS [place] UPPER EXTREMITY ARTERIAL  DUPLEX Result Date: 11/19/2023  UPPER EXTREMITY DUPLEX STUDY Patient Name:  Linda Brady Edsall  Date of Exam:   11/19/2023 Medical Rec #: [id number]          Accession #:    [id number] Date of Birth: May 03, 1941         Patient Gender: F Patient Age:   65 years Exam Location:  [facility] Procedure:      VAS [place] UPPER EXTREMITY ARTERIAL DUPLEX Referring Phys: Lemar Livings --------------------------------------------------------------------------------  Indications: Follow up. History:     Patient has a history of 10/24/23: Right axillary stent for              pseudoaneurysm repair.  Performing Technologist: Thereasa Parkin RVT  Examination Guidelines: A complete evaluation includes B-mode imaging, spectral Doppler, color Doppler, and power Doppler as needed of all accessible portions of each vessel. Bilateral testing is considered an integral part of a complete examination. Limited examinations for reoccurring indications may be performed as noted.  Right Doppler Findings: +---------------+----------+----------------+--------+-------------+ Site           PSV (cm/s)Waveform        StenosisComments      +---------------+----------+----------------+--------+-------------+ Subclavian Mid 76        biphasic                              +---------------+----------+----------------+--------+-------------+ Subclavian Dist75        biphasic                              +---------------+----------+----------------+--------+-------------+ Axillary                                         stent          +---------------+----------+----------------+--------+-------------+ Brachial Prox  166       biphasic                              +---------------+----------+----------------+--------+-------------+ Brachial Mid   166       biphasic                              +---------------+----------+----------------+--------+-------------+ Brachial Dist  89        biphasic                              +---------------+----------+----------------+--------+-------------+ Radial Prox    38        monophasic                            +---------------+----------+----------------+--------+-------------+ Radial Mid     16        monophasic              pre occlusive +---------------+----------+----------------+--------+-------------+ Radial Dist              appears occluded                      +---------------+----------+----------------+--------+-------------+ Ulnar Prox     56        biphasic                              +---------------+----------+----------------+--------+-------------+  Ulnar Mid      104       biphasic                              +---------------+----------+----------------+--------+-------------+ Ulnar Dist     92        biphasic                              +---------------+----------+----------------+--------+-------------+  Right Stent(s): +---------------+--------+--------+---------+--------+ axillary art   PSV cm/sStenosisWaveform Comments +---------------+--------+--------+---------+--------+ Prox to Stent  88              biphasic          +---------------+--------+--------+---------+--------+ Proximal Stent 80              biphasic          +---------------+--------+--------+---------+--------+ Mid Stent      60              triphasic         +---------------+--------+--------+---------+--------+ Distal Stent   75              triphasic         +---------------+--------+--------+---------+--------+ Distal to  [id number]             triphasic         +---------------+--------+--------+---------+--------+  Summary:  Right: Patent axillary artery stent.        Pre occlusive waveforms in the radial artery prox and mid and        no flow detected distally.        Patent ulnar artery with biphasic waveforms. *See table(s) above for measurements and observations. Electronically signed by Lemar Livings MD on 11/19/2023 at 4:29:30 PM.    Final          Recent Lab Findings: Recent Labs       Lab Results  Component Value Date    WBC 5.6 11/17/2023    HGB 10.9 (L) 11/17/2023    HCT 32.8 (L) 11/17/2023    PLT 419 (H) 11/17/2023    GLUCOSE 124 (H) 11/17/2023    CHOL 158 09/10/2020    TRIG 100 09/10/2020    HDL 53 09/10/2020    LDLCALC 85 09/10/2020    ALT 16 11/17/2023    AST 20 11/17/2023    NA 130 (L) 11/17/2023    K 2.4 (LL) 11/17/2023    CL 91 (L) 11/17/2023    CREATININE 1.10 (H) 11/17/2023    BUN 14 11/17/2023    CO2 28 11/17/2023    TSH 1.152 09/18/2023    INR 0.9 09/10/2020            Assessment / Plan:     82 yo female with NYHA class 2-3 symptoms of severe AS with normal LV function and without CAD however frail with advanced age, COPD, CRI with recent afib and CHF. Pt would benefit from TAVR and we discussed all the risks and goals and recovery from the procedure and she and her daughter understand and wish to proceed. Her anatomy favors a 23 sapien valve via the femoral access point. She is not a bail out candidate

## 2023-12-02 ENCOUNTER — Encounter (HOSPITAL_COMMUNITY)
Admission: RE | Disposition: A | Discharge status: Home / Self Care | Payer: Self-pay | Source: Home / Self Care | Attending: Cardiovascular Disease

## 2023-12-02 ENCOUNTER — Other Ambulatory Visit: Payer: Self-pay

## 2023-12-02 ENCOUNTER — Inpatient Hospital Stay (HOSPITAL_COMMUNITY): Payer: Medicare Other

## 2023-12-02 ENCOUNTER — Encounter (HOSPITAL_COMMUNITY): Payer: Self-pay | Admitting: Cardiovascular Disease

## 2023-12-02 ENCOUNTER — Ambulatory Visit (HOSPITAL_COMMUNITY)
Admission: RE | Admit: 2023-12-02 | Discharge: 2023-12-02 | Disposition: A | Discharge status: Home / Self Care | Payer: Medicare Other | Attending: Cardiovascular Disease | Admitting: Cardiovascular Disease

## 2023-12-02 DIAGNOSIS — I509 Heart failure, unspecified: Secondary | ICD-10-CM | POA: Insufficient documentation

## 2023-12-02 DIAGNOSIS — I4891 Unspecified atrial fibrillation: Secondary | ICD-10-CM | POA: Diagnosis not present

## 2023-12-02 DIAGNOSIS — E876 Hypokalemia: Secondary | ICD-10-CM

## 2023-12-02 DIAGNOSIS — I110 Hypertensive heart disease with heart failure: Secondary | ICD-10-CM | POA: Diagnosis not present

## 2023-12-02 DIAGNOSIS — I350 Nonrheumatic aortic (valve) stenosis: Principal | ICD-10-CM | POA: Insufficient documentation

## 2023-12-02 DIAGNOSIS — J449 Chronic obstructive pulmonary disease, unspecified: Secondary | ICD-10-CM | POA: Diagnosis not present

## 2023-12-02 DIAGNOSIS — Z539 Procedure and treatment not carried out, unspecified reason: Secondary | ICD-10-CM | POA: Diagnosis not present

## 2023-12-02 DIAGNOSIS — K219 Gastro-esophageal reflux disease without esophagitis: Secondary | ICD-10-CM | POA: Insufficient documentation

## 2023-12-02 DIAGNOSIS — Z87891 Personal history of nicotine dependence: Secondary | ICD-10-CM | POA: Insufficient documentation

## 2023-12-02 LAB — BASIC METABOLIC PANEL
Anion gap: 11 (ref 5–15)
BUN: 9 mg/dL (ref 8–23)
CO2: 27 mmol/L (ref 22–32)
Calcium: 9.5 mg/dL (ref 8.9–10.3)
Chloride: 96 mmol/L — ABNORMAL LOW (ref 98–111)
Creatinine, Ser: 0.94 mg/dL (ref 0.44–1.00)
GFR, Estimated: >60 mL/min (ref >60)
Glucose, Bld: 102 mg/dL — ABNORMAL HIGH (ref 70–99)
Potassium: 2.4 mmol/L — CL (ref 3.5–5.1)
Sodium: 134 mmol/L — ABNORMAL LOW (ref 135–145)

## 2023-12-02 LAB — ABO/RH: ABO/RH(D): A POS

## 2023-12-02 SURGERY — TRANSCATHETER AORTIC VALVE REPLACEMENT, TRANSFEMORAL (CATHLAB)
Anesthesia: Monitor Anesthesia Care

## 2023-12-02 MED ORDER — SODIUM CHLORIDE 0.9 % IV SOLN: INTRAVENOUS | Status: DC

## 2023-12-02 MED ORDER — FUROSEMIDE 20 MG PO TABS: 20.0000 mg | ORAL_TABLET | Freq: Every day | ORAL | 3 refills | Status: DC

## 2023-12-02 MED ORDER — CHLORHEXIDINE GLUCONATE 0.12 % MT SOLN
15.0000 mL | Freq: Once | OROMUCOSAL | Status: AC
Start: 2023-12-03 — End: 2023-12-02
  Administered 2023-12-02: 15 mL via OROMUCOSAL
  Filled 2023-12-02 (×2): qty 15

## 2023-12-02 MED ORDER — POTASSIUM CHLORIDE CRYS ER 20 MEQ PO TBCR: 40.0000 meq | EXTENDED_RELEASE_TABLET | Freq: Two times a day (BID) | ORAL | 2 refills | Status: DC

## 2023-12-02 MED ORDER — CHLORHEXIDINE GLUCONATE 4 % EX SOLN
30.0000 mL | CUTANEOUS | Status: DC
  Filled 2023-12-02: qty 30

## 2023-12-02 MED ORDER — CHLORHEXIDINE GLUCONATE 4 % EX SOLN
60.0000 mL | Freq: Once | CUTANEOUS | Status: DC
Start: 2023-12-03 — End: 2023-12-02
  Filled 2023-12-02: qty 60

## 2023-12-02 MED ORDER — CHLORHEXIDINE GLUCONATE 4 % EX SOLN
60.0000 mL | Freq: Once | CUTANEOUS | Status: DC
Start: 2023-12-02 — End: 2023-12-02
  Filled 2023-12-02: qty 60

## 2023-12-02 NOTE — Progress Notes (Signed)
 Notified by anesthesia team that the patient's potassium is 2.4 today.  Her previous potassium on preop labs resulted at 2.6.  She was repleted with oral potassium and presented today for TAVR.  Unfortunately, even with oral K-Dur, her potassium remains low at 2.4.  Her potassium trend is 4.1 on 11/04/2023, then 2.4 on 11/17/2023, then 2.6 on 11/28/2023, and today's result 2.4 on 12/02/2023.  The patient took her medicines as instructed.  She understands that we cannot safely proceed with elective TAVR today in the setting of severe hypokalemia.  She otherwise is asymptomatic.  Plan as follows:  Start potassium chloride  40 mill equivalents twice daily and continue as a standing dose Reduce furosemide  to 20 mg daily Repeat metabolic panel next week with structural APP visit to reschedule TAVR as long as potassium is back into normal range Instructed patient and daughter on potassium rich foods Medication list reviewed and I do not see any other medications that would contribute to hypokalemia Today's procedure is cancelled and she will be rescheduled when K+ is in normal range  Ozell Fell 12/02/2023 10:34 AM

## 2023-12-02 NOTE — Progress Notes (Signed)
Pt discharged. AAOx4 with the daughter at the bedside. Follow-up instructions given by Dr. Earmon Phoenix office. Pt in no apparent distress or pain. Pt escorted to her vehicle via wheelchair.

## 2023-12-02 NOTE — Progress Notes (Signed)
 CRITICAL RESULT PROVIDER NOTIFICATION  Test performed and critical result:  K+ 2.4  Date and time result received:  12/02/23 0914  Provider name/title: Dr. Kelly Mace  Date and time provider notified: 12/02/23 0915  Date and time provider responded: 12/02/23 0915  Provider response:No new orders- Dr. Mace will relay results to Dr. Wonda.

## 2023-12-05 ENCOUNTER — Ambulatory Visit: Payer: Medicare Other

## 2023-12-09 ENCOUNTER — Other Ambulatory Visit: Payer: Self-pay

## 2023-12-09 DIAGNOSIS — I7776 Dissection of artery of upper extremity: Secondary | ICD-10-CM

## 2023-12-09 NOTE — Addendum Note (Signed)
 Addended by: Leilani Able, Cambelle Suchecki A on: 12/09/2023 09:51 AM   Modules accepted: Orders

## 2023-12-09 NOTE — Progress Notes (Signed)
 [facility]   MULTIDISCIPLINARY HEART VALVE TEAM  Structural Heart Office Note:  .    Date:  12/10/2023  ID:  Linda Brady, DOB [birth year], MRN [id number] PCP: Valora Agent, MD  [facility] Providers Cardiologist:  Redell Cave, MD  History of Present Illness: .   Linda Brady is a 83 y.o. female with a hx of CAD, COPD on 02, HTN, HLD, GERD, hyponatremia, HFpEF, right axillary PSA s/p right brachial artery stenting (11/22), PAF on Eliquis  (admission for vascular repair c/b afib with RVR, Eliquis  held and discharged on DAPT), obesity (BMI 33), anemia, anxiety and severe AS who was previously scheduled for TAVR 12/31 however this was cancelled due to hypokalemia despite aggressive supplementation.   [name] has been followed by Dr. Cave as an outpatient.  She underwent cardiac catheterization 2021 which showed  50% proximal RCA lesion and 30% LAD lesion. She was admitted 09/2023 with chest pain and palpitations found to have new onset atrial fibrillation treated with IV Cardizem  with conversion to NSR. She was started on Eliquis  at that time. Echocardiogram during that admission showed LVEF of 65 to 70%, grade 2 diastolic dysfunction, mild MR, mild AI, severe aortic stenosis with a mean gradient of 49.2 mmHg.  She was seen in follow up and was doing well from an AF standpoint and was referred to the structural heart team for consideration of TAVR. She was felt to be a good candidate and scheduled for [facility] which showed mild, non-obstructive CAD.   In the post cath setting she was noted to have right axillary edema with plan to hold Eliquis  for 3 days and was discharged home. 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 She was seen by [facility] and underwent endovascular repair of right brachial artery that evening with Dr. Magda. Post op she was started on triple  therapy with ASA, Plavix , and Eliquis  for 28 days then ASA/Plavix  were to stop. She then developed AF with RVR with self conversion to SR.   She did well post hospitalization but began having worsening SOB and was scheduled for TAVR 12/31. On PAT labs, K+ found to be 2.6. She was repleated over the weekend and presented for surgery with BMET on arrival. Unfortunately, K+ returned at 2.4 and her procedure was cancelled. Kdur was increased to 40meq BID.   Today she is here with her daughter and states she has been compliant with supplementation despite having trouble swallowing the tablets. She continues to have SOB and was recently seen by her pulmonologist this week and was started on PRN nebs. I suspect SOB is multifactorial and likely will improved with TAVR. Otherwise she denies chest pain, palpitations, LE edema, orthopnea, PND, dizziness, or syncope. Denies bleeding in stool or urine.    Physical Exam:   VS:  BP 120/62 (BP Location: Left Arm, Patient Position: Sitting, Cuff Size: Normal)   Pulse 78   Ht 5' 3 (1.6 m)   Wt 174 lb 12.8 oz (79.3 kg)   SpO2 95%   BMI 30.96 kg/m    Wt Readings from Last 3 Encounters:  12/10/23 174 lb 12.8 oz (79.3 kg)  12/02/23 171 lb (77.6 kg)  11/25/23 171 lb (77.6 kg)    General: Well developed, well nourished, NAD Lungs:Clear to ausculation bilaterally. No wheezes, rales, or rhonchi. Breathing is unlabored. Cardiovascular: RRR with S1 S2. + harsh systolic murmur Extremities: No  edema. No clubbing or cyanosis. DP/PT pulses 2+ bilaterally Neuro: Alert and oriented. No focal deficits. No facial asymmetry. MAE spontaneously. Psych: Responds to questions appropriately with normal affect.    Risk Assessment/Calculations:    CHA2DS2-VASc Score = 5   This indicates a 7.2% annual risk of stroke. The patient's score is based upon:  ASSESSMENT AND PLAN: .    Severe Aortic Stenosis: As above, previously scheduled for 12/31 TAVR however cancelled after DOS  BMET showed a K+ of 2.4 despite aggressive supplementation. She has been rescheduled for 2/14 pending repeat labs today. Instruction letter given and reviewed with patient and daughter. CHG soap given. Obtain STAT BMET, Mag+ today. Discussed continuing current Kdur regimen of 40meq BID until labs return and she is notified.   Hypokalemia: Appears this is a recurrent issues. Continue Kdur 40meq BID until TAVR unless notified by our team differently. Obtain STAT BMET, Mag+ today.   Paroxsymal atrial fibrillation: Denies palpitations. Continue Eliquis  5mg  BID, amiodarone  200mg  BID. Likely can reduce this prior to discharge after TAVR if no recurrence on telemetry.   HFpEF: Appears euvolemic on exam today. Minimizing Lasix  with hypokalemia. Plan to monitor closely while hospitalized for TAVR next week.   Non-obstructive CAD: LHC 10/2023 with mild, non-obstructive CAD. Continue Liptor 40mg . No ASA given DOAC. Denies anginal symptoms.   COPD: Follows with pulmonary. Continue PRN nebs.    Dispo: Home   I spent 25 minutes caring for this patient today including face-to-face discussions, ordering and reviewing labs, reviewing records from [facility] and other outside facilities, documenting in the record, and arranging for follow up.    Signed, Kate Minus, NP

## 2023-12-10 ENCOUNTER — Other Ambulatory Visit: Payer: Self-pay

## 2023-12-10 ENCOUNTER — Ambulatory Visit: Payer: Medicare Other | Attending: Cardiology | Admitting: Cardiology

## 2023-12-10 ENCOUNTER — Ambulatory Visit (HOSPITAL_COMMUNITY): Admitting: Internal Medicine

## 2023-12-10 VITALS — BP 120/62 | HR 78 | Ht 63.0 in | Wt 174.8 lb

## 2023-12-10 DIAGNOSIS — J439 Emphysema, unspecified: Secondary | ICD-10-CM | POA: Insufficient documentation

## 2023-12-10 DIAGNOSIS — I2510 Atherosclerotic heart disease of native coronary artery without angina pectoris: Secondary | ICD-10-CM | POA: Diagnosis present

## 2023-12-10 DIAGNOSIS — E876 Hypokalemia: Secondary | ICD-10-CM | POA: Insufficient documentation

## 2023-12-10 DIAGNOSIS — I350 Nonrheumatic aortic (valve) stenosis: Secondary | ICD-10-CM | POA: Diagnosis present

## 2023-12-10 DIAGNOSIS — I5032 Chronic diastolic (congestive) heart failure: Secondary | ICD-10-CM | POA: Insufficient documentation

## 2023-12-10 DIAGNOSIS — I480 Paroxysmal atrial fibrillation: Secondary | ICD-10-CM | POA: Insufficient documentation

## 2023-12-10 NOTE — Patient Instructions (Addendum)
 Medication Instructions:  Your physician recommends that you continue on your current medications as directed. Please refer to the Current Medication list given to you today.  *If you need a refill on your cardiac medications before your next appointment, please call your pharmacy*   Lab Work: BMET, MAG *STAT*  [facility] is located on the 1st floor. [address]. No appointment needed   If you have labs (blood work) drawn today and your tests are completely normal, you will receive your results only by: MyChart Message (if you have MyChart) OR A paper copy in the mail If you have any lab test that is abnormal or we need to change your treatment, we will call you to review the results.   Testing/Procedures: None ordered    Follow-Up: Follow up as scheduled

## 2023-12-12 LAB — BASIC METABOLIC PANEL
BUN/Creatinine Ratio: 9 — ABNORMAL LOW (ref 12–28)
BUN: 8 mg/dL (ref 8–27)
CO2: 29 mmol/L (ref 20–29)
Calcium: 9.8 mg/dL (ref 8.7–10.3)
Chloride: 95 mmol/L — ABNORMAL LOW (ref 96–106)
Creatinine, Ser: 0.93 mg/dL (ref 0.57–1.00)
Glucose: 101 mg/dL — ABNORMAL HIGH (ref 70–99)
Potassium: 4.3 mmol/L (ref 3.5–5.2)
Sodium: 134 mmol/L (ref 134–144)
eGFR: 62 mL/min/{1.73_m2} (ref >59)

## 2023-12-12 LAB — MAGNESIUM: Magnesium: 1.8 mg/dL (ref 1.6–2.3)

## 2023-12-15 MED ORDER — MAGNESIUM SULFATE 50 % IJ SOLN
40.0000 meq | INTRAMUSCULAR | Status: DC
  Filled 2023-12-15: qty 9.85

## 2023-12-15 MED ORDER — HEPARIN 30,000 UNITS/1000 ML (OHS) CELLSAVER SOLUTION
Status: DC
  Filled 2023-12-15: qty 1000

## 2023-12-15 MED ORDER — POTASSIUM CHLORIDE 2 MEQ/ML IV SOLN
80.0000 meq | INTRAVENOUS | Status: DC
  Filled 2023-12-15: qty 40

## 2023-12-15 MED ORDER — DEXMEDETOMIDINE HCL IN NACL 400 MCG/100ML IV SOLN
0.1000 ug/kg/h | INTRAVENOUS | Status: AC
  Administered 2023-12-16: 39.64 ug via INTRAVENOUS
  Administered 2023-12-16: 1 ug/kg/h via INTRAVENOUS
  Filled 2023-12-15: qty 100

## 2023-12-15 MED ORDER — CEFAZOLIN SODIUM-DEXTROSE 2-4 GM/100ML-% IV SOLN
2.0000 g | INTRAVENOUS | Status: AC
  Administered 2023-12-16: 2 g via INTRAVENOUS
  Filled 2023-12-15 (×2): qty 100

## 2023-12-15 MED ORDER — NOREPINEPHRINE 4 MG/250ML-% IV SOLN
0.0000 ug/min | INTRAVENOUS | Status: AC
  Administered 2023-12-16: 1 ug/min via INTRAVENOUS
  Filled 2023-12-15: qty 250

## 2023-12-15 NOTE — H&P (Signed)
 .    [address]       [place] [address]             [phone]                                   Linda Brady Marvell Medical Record #[id number] Date of Birth: [birth year]   Linda Pyo, MD Linda Mina, MD   Chief Complaint:   aortic stenosis   History of Present Illness:     Pt is an 83 yo woman who was admitted this fall with CHF and afib. At that time was noted to have aortic stenosis with a mean gradient of and with normal LV function. 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 She was felt to require O2 therapy chronically after that and pt feels inhalers no longer giving her relief for her COPD. She had no CAD at time of cath. She had CTA that had favorable anatomy for TAVR. PT was also just in ER this week with CP felt secondary to GERD and was discharged. Pt says she feels better.             Past Medical History:  Diagnosis Date   COPD (chronic obstructive pulmonary disease) (HCC)     GERD (gastroesophageal reflux disease)     Hypertension     Hypothyroidism     Thyroid disease                 Past Surgical History:  Procedure Laterality Date   BREAST CYST ASPIRATION Left 10 plus yrs    benign-twice   CATARACT EXTRACTION W/PHACO Left 03/20/2023    Procedure: CATARACT EXTRACTION PHACO AND INTRAOCULAR LENS PLACEMENT (IOC) LEFT VISION BLUE;  Surgeon: Linda Pandy, MD;  Location: [facility];  Service: Ophthalmology;  Laterality: Left;  21.02   01:38.2   LEFT HEART CATH AND CORONARY ANGIOGRAPHY N/A 09/11/2020    Procedure: LEFT HEART CATH AND CORONARY ANGIOGRAPHY;  Surgeon: Linda Millard, MD;  Location: [facility] INVASIVE CV LAB;  Service: Cardiovascular;  Laterality: N/A;   PERIPHERAL VASCULAR INTERVENTION Right 10/24/2023    Procedure: PERIPHERAL VASCULAR INTERVENTION;  Surgeon: Linda Douglas, MD;   Location: [facility]AS[facility] CV [facility];  Service: Cardiovascular;  Laterality: Right;  right axillary stent   RIGHT/LEFT HEART CATH AND CORONARY ANGIOGRAPHY N/A 10/23/2023    Procedure: RIGHT/LEFT HEART CATH AND CORONARY ANGIOGRAPHY;  Surgeon: Linda Pyo, MD;  Location: [facility] INVASIVE CV LAB;  Service: Cardiovascular;  Laterality: N/A;   UPPER EXTREMITY ANGIOGRAPHY Right 10/24/2023    Procedure: Upper Extremity Angiography;  Surgeon: Linda Douglas, MD;  Location: [facility] INVASIVE CV LAB;  Service: Cardiovascular;  Laterality: Right;          Tobacco Use History  Social History        Tobacco Use  Smoking Status Former   Current packs/day: 0.50   Average packs/day: 0.5 packs/day for 12.0 years (6.0 ttl pk-yrs)   Types: Cigarettes  Smokeless Tobacco Never  Tobacco Comments    7-8 cigarettes a day      Social History       Substance and Sexual Activity  Alcohol Use Not Currently      Social History         Socioeconomic History  Marital status: Widowed      Spouse name: Not on file   Number of children: Not on file   Years of education: Not on file   Highest education level: Not on file  Occupational History   Not on file  Tobacco Use   Smoking status: Former      Current packs/day: 0.50      Average packs/day: 0.5 packs/day for 12.0 years (6.0 ttl pk-yrs)      Types: Cigarettes   Smokeless tobacco: Never   Tobacco comments:      7-8 cigarettes a day  Vaping Use   Vaping status: Never Used  Substance and Sexual Activity   Alcohol use: Not Currently   Drug use: Not Currently   Sexual activity: Not Currently  Other Topics Concern   Not on file  Social History Narrative   Not on file    Social Drivers of Health        Financial Resource Strain: Low Risk  (09/30/2023)    Received from [facility]    Overall Financial Resource Strain (CARDIA)     Difficulty of Paying Living Expenses: Not hard at all  Food Insecurity: No Food Insecurity (10/24/2023)     Hunger Vital Sign     Worried About Running Out of Food in the Last Year: Never true     Ran Out of Food in the Last Year: Never true  Transportation Needs: No Transportation Needs (10/24/2023)    PRAPARE - Therapist, art (Medical): No     Lack of Transportation (Non-Medical): No  Physical Activity: Not on file  Stress: Not on file  Social Connections: Not on file  Intimate Partner Violence: Not At Risk (10/24/2023)    Humiliation, Afraid, Rape, and Kick questionnaire     Fear of Current or Ex-Partner: No     Emotionally Abused: No     Physically Abused: No     Sexually Abused: No      Allergies      Allergies  Allergen Reactions   Naproxen Sodium Anaphylaxis              Current Outpatient Medications  Medication Sig Dispense Refill   ALPRAZolam (XANAX) 0.25 MG tablet Take 0.25 mg by mouth at bedtime as needed for sleep. (Patient not taking: Reported on 11/04/2023)       amiodarone (PACERONE) 200 MG tablet Take 1 tablet (200 mg total) by mouth 2 (two) times daily. 60 tablet 0   aspirin EC 81 MG tablet Take 1 tablet (81 mg total) by mouth daily. Swallow whole. 30 tablet 12   atorvastatin (LIPITOR) 40 MG tablet Take 1 tablet (40 mg total) by mouth daily. 30 tablet 0   budesonide-formoterol (SYMBICORT) 160-4.5 MCG/ACT inhaler Inhale 2 puffs into the lungs 2 (two) times daily. 1 each 12   cholecalciferol (VITAMIN D3) 25 MCG (1000 UNIT) tablet Take 1,000 Units by mouth daily.       clopidogrel (PLAVIX) 75 MG tablet Take 1 tablet (75 mg total) by mouth daily for 28 days. 28 tablet 0   FLUoxetine (PROZAC) 10 MG capsule Take 2 capsules (20 mg total) by mouth daily as needed (anxiety). 30 capsule 0   furosemide (LASIX) 20 MG tablet Take 1 tablet (20 mg total) by mouth 2 (two) times daily. 90 tablet 3   Ipratropium-Albuterol (COMBIVENT RESPIMAT) 20-100 MCG/ACT AERS respimat Inhale 1 puff into the lungs every 6 (six) hours as needed  for wheezing.        isosorbide mononitrate (IMDUR) 30 MG 24 hr tablet Take 1 tablet (30 mg total) by mouth daily. 30 tablet 1   levothyroxine (SYNTHROID) 75 MCG tablet Take 75 mcg by mouth daily.       pantoprazole (PROTONIX) 40 MG tablet Take 1 tablet (40 mg total) by mouth 2 (two) times daily. 60 tablet 1   potassium chloride SA (KLOR-CON M20) 20 MEQ tablet Take 2 tablets (40 mEq total) by mouth daily for 2 days. 4 tablet 0   rOPINIRole (REQUIP) 0.25 MG tablet Take 1 tablet (0.25 mg total) by mouth at bedtime. 30 tablet 0   sucralfate (CARAFATE) 1 g tablet Take 1 tablet (1 g total) by mouth 4 (four) times daily. 120 tablet 1   vitamin B-12 (CYANOCOBALAMIN) 1000 MCG tablet Take 1,000 mcg by mouth daily.          No current facility-administered medications for this visit.               Family History  Problem Relation Age of Onset   Breast cancer Neg Hx                  Physical Exam: In wheel chair Lungs: clear Card: RR with 3/6 sem Ext:no edema Neuro: intact         Diagnostic Studies & Laboratory data: I have personally reviewed the following studies and agree with the findings   TTE (09/2023) IMPRESSIONS     1. Left ventricular ejection fraction, by estimation, is 65 to 70%. The  left ventricle has normal function. The left ventricle has no regional  wall motion abnormalities. Left ventricular diastolic parameters are  consistent with Grade II diastolic  dysfunction (pseudonormalization).   2. Right ventricular systolic function is normal. The right ventricular  size is normal. There is mildly elevated pulmonary artery systolic  pressure.   3. The mitral valve is degenerative. Mild mitral valve regurgitation.   4. The aortic valve is calcified. Aortic valve regurgitation is mild.  Severe aortic valve stenosis. Aortic valve area, by VTI measures 0.99 cm.  Aortic valve mean gradient measures 49.2 mmHg. Aortic valve Vmax measures  4.56 m/s.   5. The inferior vena cava is normal in  size with greater than 50%  respiratory variability, suggesting right atrial pressure of 3 mmHg.   Conclusion(s)/Recommendation(s): Findings consistent with severe valvular  heart disease.   FINDINGS   Left Ventricle: Left ventricular ejection fraction, by estimation, is 65  to 70%. The left ventricle has normal function. The left ventricle has no  regional wall motion abnormalities. The left ventricular internal cavity  size was normal in size. There is   no left ventricular hypertrophy. Left ventricular diastolic parameters  are consistent with Grade II diastolic dysfunction (pseudonormalization).   Right Ventricle: The right ventricular size is normal. No increase in  right ventricular wall thickness. Right ventricular systolic function is  normal. There is mildly elevated pulmonary artery systolic pressure. The  tricuspid regurgitant velocity is 2.71   m/s, and with an assumed right atrial pressure of 8 mmHg, the estimated  right ventricular systolic pressure is 37.4 mmHg.   Left Atrium: Left atrial size was normal in size.   Right Atrium: Right atrial size was normal in size.   Pericardium: There is no evidence of pericardial effusion.   Mitral Valve: The mitral valve is degenerative in appearance. Mild to  moderate mitral annular calcification. Mild mitral valve regurgitation.  MV  peak gradient, 7.5 mmHg. The mean mitral valve gradient is 3.0 mmHg.   Tricuspid Valve: The tricuspid valve is normal in structure. Tricuspid  valve regurgitation is mild.   Aortic Valve: The aortic valve is calcified. Aortic valve regurgitation is  mild. Aortic regurgitation PHT measures 436 msec. Severe aortic stenosis  is present. Aortic valve mean gradient measures 49.2 mmHg. Aortic valve  peak gradient measures 83.1 mmHg.  Aortic valve area, by VTI measures 0.99 cm.   Pulmonic Valve: The pulmonic valve was not well visualized. Pulmonic valve  regurgitation is trivial.   Aorta: The  aortic root and ascending aorta are structurally normal, with  no evidence of dilitation.   Venous: The inferior vena cava is normal in size with greater than 50%  respiratory variability, suggesting right atrial pressure of 3 mmHg.   IAS/Shunts: No atrial level shunt detected by color flow Doppler.     LEFT VENTRICLE  PLAX 2D  LVIDd:         3.60 cm   Diastology  LVIDs:         2.40 cm   LV e' medial:    7.45 cm/s  LV PW:         1.30 cm   LV E/e' medial:  15.7  LV IVS:        1.30 cm   LV e' lateral:   6.92 cm/s  LVOT diam:     2.00 cm   LV E/e' lateral: 16.9  LV SV:         106  LV SV Index:   57  LVOT Area:     3.14 cm     RIGHT VENTRICLE  RV Basal diam:  3.25 cm  RV Mid diam:    2.90 cm  RV S prime:     12.60 cm/s  TAPSE (M-mode): 2.6 cm   LEFT ATRIUM             Index        RIGHT ATRIUM           Index  LA diam:        4.50 cm 2.42 cm/m   RA Area:     15.10 cm  LA Vol (A2C):   63.0 ml 33.90 ml/m  RA Volume:   40.70 ml  21.90 ml/m  LA Vol (A4C):   45.5 ml 24.48 ml/m  LA Biplane Vol: 56.5 ml 30.40 ml/m   AORTIC VALVE  AV Area (Vmax):    1.03 cm  AV Area (Vmean):   0.97 cm  AV Area (VTI):     0.99 cm  AV Vmax:           455.80 cm/s  AV Vmean:          322.600 cm/s  AV VTI:            1.079 m  AV Peak Grad:      83.1 mmHg  AV Mean Grad:      49.2 mmHg  LVOT Vmax:         150.00 cm/s  LVOT Vmean:        99.600 cm/s  LVOT VTI:          0.338 m  LVOT/AV VTI ratio: 0.31  AI PHT:            436 msec    AORTA  Ao Root diam: 3.10 cm  Ao Asc diam:  2.80 cm   MITRAL VALVE  TRICUSPID VALVE  MV Area (PHT): 2.90 cm     TR Peak grad:   29.4 mmHg  MV Area VTI:   2.62 cm     TR Vmax:        271.00 cm/s  MV Peak grad:  7.5 mmHg  MV Mean grad:  3.0 mmHg     SHUNTS  MV Vmax:       1.37 m/s     Systemic VTI:  0.34 m  MV Vmean:      79.3 cm/s    Systemic Diam: 2.00 cm  MV Decel Time: 262 msec  MV E velocity: 117.00 cm/s  MV A velocity: 96.90 cm/s   MV E/A ratio:  1.21    CATH (10/2023) Conclusion       Prox LAD lesion is 30% stenosed.   Ost RCA to Prox RCA lesion is 20% stenosed.   1.  Mild nonobstructive coronary artery disease. 2.  Fick cardiac output of 9.0 L/min and Fick cardiac index of 4.9 L/min/m with the following hemodynamics             Right atrial pressure mean of 7 mmHg             Right ventricular pressure 48/27 with end-diastolic pressure of 14 mmHg             PA pressure 48/17 with a mean of 29 mmHg             Wedge pressure mean of 18 mmHg V waves to 27 mmHg             PVR 1.2 Woods units             PA pulsatility index of 4    Recent Radiology Findings:  CTA (10/2023) FINDINGS: Aortic Root:   Aortic valve: Tricuspid   Aortic valve calcium score: 1940   Aortic annulus:   Diameter: 24mm x 23mm   Perimeter: 71mm   Area: 324mm^2   Calcifications: Mild calcification adjacent to left coronary cusp   Coronary height: Min Left - 13mm; Min Right - 13mm   Sinotubular height: Left cusp - 20mm; Right cusp - 19mm; Noncoronary cusp - 20mm   LVOT (as measured 3 mm below the annulus):   Diameter: 24mm x 22mm   Area: 319mm^2   Calcifications: No calcifications   Aortic sinus width: Left cusp - 29mm; Right cusp - 29mm; Noncoronary cusp - 30mm   Sinotubular junction width: 28mm x 26mm   Optimum Fluoroscopic Angle for Delivery: LAO 13 CRA 4   Cardiac:   Right atrium: Normal size   Right ventricle: Normal size   Pulmonary arteries: Normal size   Pulmonary veins: Normal configuration   Left atrium: Mild enlargement   Left ventricle: Normal size   Pericardium: Normal thickness   Coronary arteries: Calcium score 232 (65th percentile)   IMPRESSION: 1. Tricuspid aortic valve with severe calcifications (AV calcium score 1940)   2. Aortic annulus measures 24mm x 23mm in diameter with perimeter 71mm and area 340mm^2. Mild annular calcifications adjacent to left coronary cusp. Annular  measurements suitable for delivery of 23mm Edwards Sapien 3 valve.   3. Sufficient coronary to annulus distance, measures 13mm to left main and 13mm to RCA   4.  Optimum Fluoroscopic Angle for Delivery:   LAO 13 CRA 4   5.  Coronary calcium score 232 (65th percentile)     Imaging Results (Last 48 hours)  VAS [place] UPPER EXTREMITY ARTERIAL  DUPLEX Result Date: 11/19/2023  UPPER EXTREMITY DUPLEX STUDY Patient Name:  Linda Brady Edsall  Date of Exam:   11/19/2023 Medical Rec #: [id number]          Accession #:    [id number] Date of Birth: May 03, 1941         Patient Gender: F Patient Age:   65 years Exam Location:  [facility] Procedure:      VAS [place] UPPER EXTREMITY ARTERIAL DUPLEX Referring Phys: Lemar Livings --------------------------------------------------------------------------------  Indications: Follow up. History:     Patient has a history of 10/24/23: Right axillary stent for              pseudoaneurysm repair.  Performing Technologist: Thereasa Parkin RVT  Examination Guidelines: A complete evaluation includes B-mode imaging, spectral Doppler, color Doppler, and power Doppler as needed of all accessible portions of each vessel. Bilateral testing is considered an integral part of a complete examination. Limited examinations for reoccurring indications may be performed as noted.  Right Doppler Findings: +---------------+----------+----------------+--------+-------------+ Site           PSV (cm/s)Waveform        StenosisComments      +---------------+----------+----------------+--------+-------------+ Subclavian Mid 76        biphasic                              +---------------+----------+----------------+--------+-------------+ Subclavian Dist75        biphasic                              +---------------+----------+----------------+--------+-------------+ Axillary                                         stent          +---------------+----------+----------------+--------+-------------+ Brachial Prox  166       biphasic                              +---------------+----------+----------------+--------+-------------+ Brachial Mid   166       biphasic                              +---------------+----------+----------------+--------+-------------+ Brachial Dist  89        biphasic                              +---------------+----------+----------------+--------+-------------+ Radial Prox    38        monophasic                            +---------------+----------+----------------+--------+-------------+ Radial Mid     16        monophasic              pre occlusive +---------------+----------+----------------+--------+-------------+ Radial Dist              appears occluded                      +---------------+----------+----------------+--------+-------------+ Ulnar Prox     56        biphasic                              +---------------+----------+----------------+--------+-------------+  Ulnar Mid      104       biphasic                              +---------------+----------+----------------+--------+-------------+ Ulnar Dist     92        biphasic                              +---------------+----------+----------------+--------+-------------+  Right Stent(s): +---------------+--------+--------+---------+--------+ axillary art   PSV cm/sStenosisWaveform Comments +---------------+--------+--------+---------+--------+ Prox to Stent  88              biphasic          +---------------+--------+--------+---------+--------+ Proximal Stent 80              biphasic          +---------------+--------+--------+---------+--------+ Mid Stent      60              triphasic         +---------------+--------+--------+---------+--------+ Distal Stent   75              triphasic         +---------------+--------+--------+---------+--------+ Distal to  [id number]             triphasic         +---------------+--------+--------+---------+--------+  Summary:  Right: Patent axillary artery stent.        Pre occlusive waveforms in the radial artery prox and mid and        no flow detected distally.        Patent ulnar artery with biphasic waveforms. *See table(s) above for measurements and observations. Electronically signed by Lemar Livings MD on 11/19/2023 at 4:29:30 PM.    Final          Recent Lab Findings: Recent Labs       Lab Results  Component Value Date    WBC 5.6 11/17/2023    HGB 10.9 (L) 11/17/2023    HCT 32.8 (L) 11/17/2023    PLT 419 (H) 11/17/2023    GLUCOSE 124 (H) 11/17/2023    CHOL 158 09/10/2020    TRIG 100 09/10/2020    HDL 53 09/10/2020    LDLCALC 85 09/10/2020    ALT 16 11/17/2023    AST 20 11/17/2023    NA 130 (L) 11/17/2023    K 2.4 (LL) 11/17/2023    CL 91 (L) 11/17/2023    CREATININE 1.10 (H) 11/17/2023    BUN 14 11/17/2023    CO2 28 11/17/2023    TSH 1.152 09/18/2023    INR 0.9 09/10/2020            Assessment / Plan:     83 yo female with NYHA class 2-3 symptoms of severe AS with normal LV function and without CAD however frail with advanced age, COPD, CRI with recent afib and CHF. Pt would benefit from TAVR and we discussed all the risks and goals and recovery from the procedure and she and her daughter understand and wish to proceed. Her anatomy favors a 23 sapien valve via the femoral access point. She is not a bail out candidate

## 2023-12-16 ENCOUNTER — Encounter (HOSPITAL_COMMUNITY)
Admission: RE | Disposition: A | Discharge status: Home / Self Care | Payer: Self-pay | Source: Home / Self Care | Attending: Internal Medicine

## 2023-12-16 ENCOUNTER — Inpatient Hospital Stay (HOSPITAL_COMMUNITY)
Admission: RE | Admit: 2023-12-16 | Discharge: 2023-12-17 | DRG: 267 | Disposition: A | Discharge status: Home / Self Care | Payer: Medicare Other | Attending: Internal Medicine | Admitting: Internal Medicine

## 2023-12-16 ENCOUNTER — Other Ambulatory Visit: Payer: Self-pay

## 2023-12-16 ENCOUNTER — Encounter (HOSPITAL_COMMUNITY): Payer: Self-pay | Admitting: Internal Medicine

## 2023-12-16 ENCOUNTER — Inpatient Hospital Stay (HOSPITAL_COMMUNITY): Payer: Medicare Other | Admitting: Anesthesiology

## 2023-12-16 ENCOUNTER — Inpatient Hospital Stay (HOSPITAL_COMMUNITY): Payer: Medicare Other

## 2023-12-16 ENCOUNTER — Inpatient Hospital Stay (HOSPITAL_COMMUNITY): Payer: Self-pay | Admitting: Anesthesiology

## 2023-12-16 DIAGNOSIS — I2510 Atherosclerotic heart disease of native coronary artery without angina pectoris: Secondary | ICD-10-CM | POA: Diagnosis present

## 2023-12-16 DIAGNOSIS — E876 Hypokalemia: Secondary | ICD-10-CM | POA: Diagnosis present

## 2023-12-16 DIAGNOSIS — I5032 Chronic diastolic (congestive) heart failure: Secondary | ICD-10-CM

## 2023-12-16 DIAGNOSIS — Z6833 Body mass index (BMI) 33.0-33.9, adult: Secondary | ICD-10-CM

## 2023-12-16 DIAGNOSIS — R54 Age-related physical debility: Secondary | ICD-10-CM | POA: Diagnosis present

## 2023-12-16 DIAGNOSIS — Z87891 Personal history of nicotine dependence: Secondary | ICD-10-CM

## 2023-12-16 DIAGNOSIS — E871 Hypo-osmolality and hyponatremia: Secondary | ICD-10-CM | POA: Diagnosis present

## 2023-12-16 DIAGNOSIS — Z95820 Peripheral vascular angioplasty status with implants and grafts: Secondary | ICD-10-CM

## 2023-12-16 DIAGNOSIS — I110 Hypertensive heart disease with heart failure: Secondary | ICD-10-CM

## 2023-12-16 DIAGNOSIS — I10 Essential (primary) hypertension: Secondary | ICD-10-CM | POA: Diagnosis present

## 2023-12-16 DIAGNOSIS — Z7982 Long term (current) use of aspirin: Secondary | ICD-10-CM | POA: Diagnosis not present

## 2023-12-16 DIAGNOSIS — Z7951 Long term (current) use of inhaled steroids: Secondary | ICD-10-CM

## 2023-12-16 DIAGNOSIS — Z006 Encounter for examination for normal comparison and control in clinical research program: Secondary | ICD-10-CM

## 2023-12-16 DIAGNOSIS — J449 Chronic obstructive pulmonary disease, unspecified: Secondary | ICD-10-CM | POA: Diagnosis present

## 2023-12-16 DIAGNOSIS — Z7902 Long term (current) use of antithrombotics/antiplatelets: Secondary | ICD-10-CM

## 2023-12-16 DIAGNOSIS — Z886 Allergy status to analgesic agent status: Secondary | ICD-10-CM

## 2023-12-16 DIAGNOSIS — I480 Paroxysmal atrial fibrillation: Secondary | ICD-10-CM | POA: Diagnosis present

## 2023-12-16 DIAGNOSIS — Z79890 Hormone replacement therapy (postmenopausal): Secondary | ICD-10-CM | POA: Diagnosis not present

## 2023-12-16 DIAGNOSIS — E039 Hypothyroidism, unspecified: Secondary | ICD-10-CM | POA: Diagnosis present

## 2023-12-16 DIAGNOSIS — E785 Hyperlipidemia, unspecified: Secondary | ICD-10-CM | POA: Diagnosis present

## 2023-12-16 DIAGNOSIS — Z8679 Personal history of other diseases of the circulatory system: Secondary | ICD-10-CM

## 2023-12-16 DIAGNOSIS — I440 Atrioventricular block, first degree: Secondary | ICD-10-CM | POA: Diagnosis present

## 2023-12-16 DIAGNOSIS — I350 Nonrheumatic aortic (valve) stenosis: Principal | ICD-10-CM

## 2023-12-16 DIAGNOSIS — Z952 Presence of prosthetic heart valve: Secondary | ICD-10-CM | POA: Diagnosis not present

## 2023-12-16 DIAGNOSIS — Z79899 Other long term (current) drug therapy: Secondary | ICD-10-CM

## 2023-12-16 DIAGNOSIS — E669 Obesity, unspecified: Secondary | ICD-10-CM | POA: Diagnosis present

## 2023-12-16 DIAGNOSIS — I7776 Dissection of artery of upper extremity: Secondary | ICD-10-CM | POA: Diagnosis present

## 2023-12-16 DIAGNOSIS — K219 Gastro-esophageal reflux disease without esophagitis: Secondary | ICD-10-CM | POA: Diagnosis present

## 2023-12-16 DIAGNOSIS — Z7901 Long term (current) use of anticoagulants: Secondary | ICD-10-CM

## 2023-12-16 HISTORY — PX: INTRAOPERATIVE TRANSTHORACIC ECHOCARDIOGRAM: SHX6523

## 2023-12-16 HISTORY — DX: Presence of prosthetic heart valve: Z95.2

## 2023-12-16 HISTORY — DX: Heart failure, unspecified: I50.9

## 2023-12-16 HISTORY — DX: Unspecified atrial fibrillation: I48.91

## 2023-12-16 LAB — CBC
HCT: 35.9 % — ABNORMAL LOW (ref 36.0–46.0)
Hemoglobin: 11.6 g/dL — ABNORMAL LOW (ref 12.0–15.0)
MCH: 30.1 pg (ref 26.0–34.0)
MCHC: 32.3 g/dL (ref 30.0–36.0)
MCV: 93.2 fL (ref 80.0–100.0)
Platelets: 480 10*3/uL — ABNORMAL HIGH (ref 150–400)
RBC: 3.85 MIL/uL — ABNORMAL LOW (ref 3.87–5.11)
RDW: 14.8 % (ref 11.5–15.5)
WBC: 5.3 10*3/uL (ref 4.0–10.5)
nRBC: 0 % (ref 0.0–0.2)

## 2023-12-16 LAB — COMPREHENSIVE METABOLIC PANEL
ALT: 36 U/L (ref 0–44)
AST: 36 U/L (ref 15–41)
Albumin: 3.6 g/dL (ref 3.5–5.0)
Alkaline Phosphatase: 145 U/L — ABNORMAL HIGH (ref 38–126)
Anion gap: 13 (ref 5–15)
BUN: 7 mg/dL — ABNORMAL LOW (ref 8–23)
CO2: 24 mmol/L (ref 22–32)
Calcium: 9.6 mg/dL (ref 8.9–10.3)
Chloride: 97 mmol/L — ABNORMAL LOW (ref 98–111)
Creatinine, Ser: 0.99 mg/dL (ref 0.44–1.00)
GFR, Estimated: 58 mL/min — ABNORMAL LOW (ref >60)
Glucose, Bld: 100 mg/dL — ABNORMAL HIGH (ref 70–99)
Potassium: 3.8 mmol/L (ref 3.5–5.1)
Sodium: 134 mmol/L — ABNORMAL LOW (ref 135–145)
Total Bilirubin: 0.8 mg/dL (ref 0.0–1.2)
Total Protein: 7.3 g/dL (ref 6.5–8.1)

## 2023-12-16 LAB — POCT I-STAT, CHEM 8
BUN: 7 mg/dL — ABNORMAL LOW (ref 8–23)
Calcium, Ion: 1.14 mmol/L — ABNORMAL LOW (ref 1.15–1.40)
Chloride: 97 mmol/L — ABNORMAL LOW (ref 98–111)
Creatinine, Ser: 1 mg/dL (ref 0.44–1.00)
Glucose, Bld: 115 mg/dL — ABNORMAL HIGH (ref 70–99)
HCT: 27 % — ABNORMAL LOW (ref 36.0–46.0)
Hemoglobin: 9.2 g/dL — ABNORMAL LOW (ref 12.0–15.0)
Potassium: 3.9 mmol/L (ref 3.5–5.1)
Sodium: 136 mmol/L (ref 135–145)
TCO2: 26 mmol/L (ref 22–32)

## 2023-12-16 LAB — TYPE AND SCREEN
ABO/RH(D): A POS
Antibody Screen: NEGATIVE

## 2023-12-16 LAB — ECHOCARDIOGRAM LIMITED
AR max vel: 3.86 cm2
AV Area VTI: 4.05 cm2
AV Area mean vel: 3.72 cm2
AV Mean grad: 7 mm[Hg]
AV Peak grad: 13 mm[Hg]
Ao pk vel: 1.8 m/s
Calc EF: 67.2 %
S' Lateral: 2.3 cm
Single Plane A2C EF: 69.9 %
Single Plane A4C EF: 64.6 %

## 2023-12-16 LAB — PROTIME-INR
INR: 1 (ref 0.8–1.2)
Prothrombin Time: 13.6 s (ref 11.4–15.2)

## 2023-12-16 LAB — POCT ACTIVATED CLOTTING TIME: Activated Clotting Time: 268 s

## 2023-12-16 SURGERY — TRANSCATHETER AORTIC VALVE REPLACEMENT, TRANSFEMORAL (CATHLAB)
Anesthesia: Monitor Anesthesia Care

## 2023-12-16 MED ORDER — MORPHINE SULFATE (PF) 2 MG/ML IV SOLN: 1.0000 mg | INTRAVENOUS | Status: DC | PRN

## 2023-12-16 MED ORDER — PANTOPRAZOLE SODIUM 40 MG PO TBEC
40.0000 mg | DELAYED_RELEASE_TABLET | Freq: Two times a day (BID) | ORAL | Status: DC
  Administered 2023-12-16 – 2023-12-17 (×2): 40 mg via ORAL
  Filled 2023-12-16 (×2): qty 1

## 2023-12-16 MED ORDER — POTASSIUM CHLORIDE CRYS ER 20 MEQ PO TBCR
40.0000 meq | EXTENDED_RELEASE_TABLET | Freq: Two times a day (BID) | ORAL | Status: DC
Start: 2023-12-16 — End: 2023-12-17
  Administered 2023-12-16 – 2023-12-17 (×2): 40 meq via ORAL
  Filled 2023-12-16 (×2): qty 2

## 2023-12-16 MED ORDER — SODIUM CHLORIDE 0.9% FLUSH
3.0000 mL | Freq: Two times a day (BID) | INTRAVENOUS | Status: DC
  Administered 2023-12-16 – 2023-12-17 (×2): 3 mL via INTRAVENOUS

## 2023-12-16 MED ORDER — CHLORHEXIDINE GLUCONATE 0.12 % MT SOLN
15.0000 mL | Freq: Once | OROMUCOSAL | Status: AC
Start: 2023-12-17 — End: 2023-12-16
  Administered 2023-12-16: 15 mL via OROMUCOSAL
  Filled 2023-12-16 (×2): qty 15

## 2023-12-16 MED ORDER — FENTANYL CITRATE (PF) 100 MCG/2ML IJ SOLN
INTRAMUSCULAR | Status: AC
  Filled 2023-12-16: qty 2

## 2023-12-16 MED ORDER — SODIUM CHLORIDE 0.9% FLUSH: 3.0000 mL | INTRAVENOUS | Status: DC | PRN

## 2023-12-16 MED ORDER — SODIUM CHLORIDE 0.9 % IV SOLN: INTRAVENOUS | Status: DC

## 2023-12-16 MED ORDER — ALPRAZOLAM 0.25 MG PO TABS
0.2500 mg | ORAL_TABLET | Freq: Every evening | ORAL | Status: DC | PRN
  Administered 2023-12-16: 0.25 mg via ORAL
  Filled 2023-12-16: qty 1

## 2023-12-16 MED ORDER — LEVOTHYROXINE SODIUM 75 MCG PO TABS
75.0000 ug | ORAL_TABLET | Freq: Every day | ORAL | Status: DC
  Administered 2023-12-17: 75 ug via ORAL
  Filled 2023-12-16: qty 1

## 2023-12-16 MED ORDER — CHLORHEXIDINE GLUCONATE 4 % EX SOLN
30.0000 mL | CUTANEOUS | Status: DC
  Filled 2023-12-16: qty 30

## 2023-12-16 MED ORDER — FENTANYL CITRATE (PF) 100 MCG/2ML IJ SOLN
INTRAMUSCULAR | Status: DC | PRN
  Administered 2023-12-16 (×2): 25 ug via INTRAVENOUS

## 2023-12-16 MED ORDER — LIDOCAINE HCL (PF) 1 % IJ SOLN
INTRAMUSCULAR | Status: DC | PRN
  Administered 2023-12-16 (×2): 5 mL

## 2023-12-16 MED ORDER — SUCRALFATE 1 G PO TABS
1.0000 g | ORAL_TABLET | Freq: Three times a day (TID) | ORAL | Status: DC
Start: 2023-12-16 — End: 2023-12-17
  Administered 2023-12-16: 1 g via ORAL
  Filled 2023-12-16: qty 1

## 2023-12-16 MED ORDER — OXYCODONE HCL 5 MG PO TABS: 5.0000 mg | ORAL_TABLET | ORAL | Status: DC | PRN

## 2023-12-16 MED ORDER — LACTATED RINGERS IV SOLN: INTRAVENOUS | Status: DC | PRN

## 2023-12-16 MED ORDER — CEFAZOLIN SODIUM-DEXTROSE 2-4 GM/100ML-% IV SOLN
2.0000 g | Freq: Three times a day (TID) | INTRAVENOUS | Status: AC
  Administered 2023-12-16 – 2023-12-17 (×2): 2 g via INTRAVENOUS
  Filled 2023-12-16 (×2): qty 100

## 2023-12-16 MED ORDER — IODIXANOL 320 MG/ML IV SOLN
INTRAVENOUS | Status: DC | PRN
  Administered 2023-12-16: 50 mL via INTRA_ARTERIAL

## 2023-12-16 MED ORDER — CHLORHEXIDINE GLUCONATE 4 % EX SOLN
60.0000 mL | Freq: Once | CUTANEOUS | Status: DC
  Filled 2023-12-16: qty 60

## 2023-12-16 MED ORDER — TRAMADOL HCL 50 MG PO TABS: 50.0000 mg | ORAL_TABLET | ORAL | Status: DC | PRN

## 2023-12-16 MED ORDER — SODIUM CHLORIDE 0.9 % IV SOLN: INTRAVENOUS | Status: AC

## 2023-12-16 MED ORDER — HEPARIN (PORCINE) IN NACL 1000-0.9 UT/500ML-% IV SOLN
INTRAVENOUS | Status: DC | PRN
  Administered 2023-12-16: 1000 mL

## 2023-12-16 MED ORDER — ONDANSETRON HCL 4 MG/2ML IJ SOLN
INTRAMUSCULAR | Status: DC | PRN
  Administered 2023-12-16: 4 mg via INTRAVENOUS

## 2023-12-16 MED ORDER — MOMETASONE FURO-FORMOTEROL FUM 200-5 MCG/ACT IN AERO
2.0000 | INHALATION_SPRAY | Freq: Two times a day (BID) | RESPIRATORY_TRACT | Status: DC
  Administered 2023-12-16 – 2023-12-17 (×2): 2 via RESPIRATORY_TRACT
  Filled 2023-12-16: qty 8.8

## 2023-12-16 MED ORDER — ACETAMINOPHEN 650 MG RE SUPP: 650.0000 mg | Freq: Four times a day (QID) | RECTAL | Status: DC | PRN

## 2023-12-16 MED ORDER — ROPINIROLE HCL 0.25 MG PO TABS
0.2500 mg | ORAL_TABLET | Freq: Every day | ORAL | Status: DC
  Administered 2023-12-16 (×2): 0.25 mg via ORAL
  Filled 2023-12-16 (×3): qty 1

## 2023-12-16 MED ORDER — HEPARIN SODIUM (PORCINE) 1000 UNIT/ML IJ SOLN
INTRAMUSCULAR | Status: DC | PRN
  Administered 2023-12-16: 12000 [IU] via INTRAVENOUS

## 2023-12-16 MED ORDER — PROTAMINE SULFATE 10 MG/ML IV SOLN
INTRAVENOUS | Status: DC | PRN
  Administered 2023-12-16: 120 mg via INTRAVENOUS

## 2023-12-16 MED ORDER — SODIUM CHLORIDE 0.9 % IV SOLN: 250.0000 mL | INTRAVENOUS | Status: DC | PRN

## 2023-12-16 MED ORDER — ATORVASTATIN CALCIUM 40 MG PO TABS
40.0000 mg | ORAL_TABLET | Freq: Every day | ORAL | Status: DC
  Administered 2023-12-16: 40 mg via ORAL
  Filled 2023-12-16: qty 1

## 2023-12-16 MED ORDER — PROPOFOL 500 MG/50ML IV EMUL
INTRAVENOUS | Status: DC | PRN
  Administered 2023-12-16: 10 ug/kg/min via INTRAVENOUS

## 2023-12-16 MED ORDER — AMIODARONE HCL 200 MG PO TABS
200.0000 mg | ORAL_TABLET | Freq: Two times a day (BID) | ORAL | Status: DC
  Administered 2023-12-16 – 2023-12-17 (×2): 200 mg via ORAL
  Filled 2023-12-16 (×2): qty 1

## 2023-12-16 MED ORDER — ACETAMINOPHEN 325 MG PO TABS
650.0000 mg | ORAL_TABLET | Freq: Four times a day (QID) | ORAL | Status: DC | PRN
  Administered 2023-12-17: 650 mg via ORAL
  Filled 2023-12-16: qty 2

## 2023-12-16 MED ORDER — FLUOXETINE HCL 20 MG PO CAPS: 20.0000 mg | ORAL_CAPSULE | Freq: Every day | ORAL | Status: DC | PRN

## 2023-12-16 MED ORDER — LIDOCAINE HCL (PF) 1 % IJ SOLN
INTRAMUSCULAR | Status: AC
  Filled 2023-12-16: qty 60

## 2023-12-16 MED ORDER — ONDANSETRON HCL 4 MG/2ML IJ SOLN
4.0000 mg | Freq: Four times a day (QID) | INTRAMUSCULAR | Status: DC | PRN
  Administered 2023-12-17: 4 mg via INTRAVENOUS
  Filled 2023-12-16: qty 2

## 2023-12-16 MED ORDER — ACETAMINOPHEN 500 MG PO TABS
1000.0000 mg | ORAL_TABLET | Freq: Once | ORAL | Status: AC
  Administered 2023-12-16: 1000 mg via ORAL
  Filled 2023-12-16: qty 2

## 2023-12-16 MED ORDER — NITROGLYCERIN IN D5W 200-5 MCG/ML-% IV SOLN: 0.0000 ug/min | INTRAVENOUS | Status: DC

## 2023-12-16 SURGICAL SUPPLY — 30 items
BAG SNAP BAND KOVER 36X36 (MISCELLANEOUS) ×2 IMPLANT
BALLN MUSTANG 12X60X135 (BALLOONS) ×1
BALLOON MUSTANG 12X60X135 (BALLOONS) IMPLANT
CABLE SURGICAL S-101-97-12 (CABLE) IMPLANT
CATH 23 ULTRA DELIVERY (CATHETERS) IMPLANT
CATH DIAG 6FR PIGTAIL ANGLED (CATHETERS) IMPLANT
CATH INFINITI 5FR ANG PIGTAIL (CATHETERS) IMPLANT
CATH INFINITI 6F AL1 (CATHETERS) IMPLANT
CLOSURE MYNX CONTROL 6F/7F (Vascular Products) IMPLANT
CLOSURE PERCLOSE PROSTYLE (VASCULAR PRODUCTS) IMPLANT
CRIMPER (MISCELLANEOUS) IMPLANT
DEVICE INFLATION ATRION QL2530 (MISCELLANEOUS) IMPLANT
ELECT DEFIB PAD ADLT CADENCE (PAD) IMPLANT
KIT ENCORE 40 (KITS) IMPLANT
KIT SAPIAN 3 ULTRA RESILIA 23 (Valve) IMPLANT
PACK CARDIAC CATHETERIZATION (CUSTOM PROCEDURE TRAY) ×1 IMPLANT
SET ATX-X65L (MISCELLANEOUS) IMPLANT
SHEATH INTRODUCER SET 20-26 (SHEATH) IMPLANT
SHEATH PINNACLE 6F 10CM (SHEATH) IMPLANT
SHEATH PINNACLE 8F 10CM (SHEATH) IMPLANT
SHEATH PROBE COVER 6X72 (BAG) IMPLANT
STOPCOCK MORSE 400PSI 3WAY (MISCELLANEOUS) ×2 IMPLANT
TRANSDUCER W/STOPCOCK (MISCELLANEOUS) IMPLANT
WIRE AMPLATZ SS-J .035X260CM (WIRE) IMPLANT
WIRE EMERALD 3MM-J .025X260CM (WIRE) IMPLANT
WIRE EMERALD 3MM-J .035X150CM (WIRE) IMPLANT
WIRE EMERALD 3MM-J .035X260CM (WIRE) IMPLANT
WIRE EMERALD ST .035X260CM (WIRE) IMPLANT
WIRE MICRO SET SILHO 5FR 7 (SHEATH) IMPLANT
WIRE SAFARI SM CURVE 275 (WIRE) IMPLANT

## 2023-12-16 NOTE — Interval H&P Note (Signed)
 History and Physical Interval Note:  12/16/2023 12:55 PM  Linda Brady  has presented today for surgery, with the diagnosis of Severe Aortic Stenosis.  The various methods of treatment have been discussed with the patient and family. After consideration of risks, benefits and other options for treatment, the patient has consented to  Procedure(s): Transcatheter Aortic Valve Replacement, Transfemoral (N/A) INTRAOPERATIVE TRANSTHORACIC ECHOCARDIOGRAM (N/A) as a surgical intervention.  The patient's history has been reviewed, patient examined, no change in status, stable for surgery.  I have reviewed the patient's chart and labs.  Questions were answered to the patient's satisfaction.     Deward Kallman

## 2023-12-16 NOTE — Progress Notes (Signed)
  [facility]   MULTIDISCIPLINARY HEART VALVE TEAM  Patient doing well s/p TAVR. She is hemodynamically stable. Groin sites stable. ECG with sinus and new IVCD and 1st deg AV block but no no high grade block. Plan to transfer to from cath lab holding to 4E when bed available.  Early ambulation after bedrest completed and hopeful discharge over the next 24-48 hours.   Lamarr Hummer PA-C  [facility]  Pager [phone]

## 2023-12-16 NOTE — Discharge Instructions (Signed)
 ACTIVITY AND EXERCISE  Daily activity and exercise are an important part of your recovery. People recover at different rates depending on their general health and type of valve procedure.  Most people recovering from TAVR feel better relatively quickly   No lifting, pushing, pulling more than 10 pounds (examples to avoid: groceries, vacuuming, gardening, golfing):             - For one week with a procedure through the groin.             - For six weeks for procedures through the chest wall or neck. NOTE: You will typically see one of our providers 7-14 days after your procedure to discuss WHEN TO RESUME the above activities.      DRIVING  Do not drive until you are seen for follow up and cleared by a provider. Generally, we ask patient to not drive for 1 week after their procedure.  If you have been told by your doctor in the past that you may not drive, you must talk with him/her before you begin driving again.   DRESSING  Groin site: you may leave the clear dressing over the site for up to one week or until it falls off.   HYGIENE  If you had a femoral (leg) procedure, you may take a shower when you return home. After the shower, pat the site dry. Do NOT use powder, oils or lotions in your groin area until the site has completely healed.  If you had a chest procedure, you may shower when you return home unless specifically instructed not to by your discharging practitioner.             - DO NOT scrub incision; pat dry with a towel.             - DO NOT apply any lotions, oils, powders to the incision.             - No tub baths / swimming for at least 2 weeks.  If you notice any fevers, chills, increased pain, swelling, bleeding or pus, please contact your doctor.   ADDITIONAL INFORMATION  If you are going to have an upcoming dental procedure, please contact our office as you will require antibiotics ahead of time to prevent infection on your heart valve.    If you have any questions  or concerns you can call the structural heart phone during normal business hours 8am-4pm. If you have an urgent need after hours or weekends please call [phone] to talk to the on call provider for general cardiology. If you have an emergency that requires immediate attention, please call 911.    After TAVR Checklist  Check  Test Description   Follow up appointment in 1-2 weeks  You will see our structural heart advanced practice provider. Your incision sites will be checked and you will be cleared to drive and resume all normal activities if you are doing well.     1 month echo and follow up  You will have an echo to check on your new heart valve and be seen back in the office by a structural heart advanced practice provider.   Follow up with your primary cardiologist You will need to be seen by your primary cardiologist in the following 3-6 months after your 1 month appointment in the valve clinic.    1 year echo and follow up You will have another echo to check on your heart valve after 1 year  and be seen back in the office by a structural heart advanced practice provider. This your last structural heart visit.   Bacterial endocarditis prophylaxis  You will have to take antibiotics for the rest of your life before all dental procedures (even teeth cleanings) to protect your heart valve. Antibiotics are also required before some surgeries. Please check with your cardiologist before scheduling any surgeries. Also, please make sure to tell us  if you have a penicillin allergy as you will require an alternative antibiotic.

## 2023-12-16 NOTE — Transfer of Care (Signed)
 Immediate Anesthesia Transfer of Care Note  Patient: Linda Brady  Procedure(s) Performed: Transcatheter Aortic Valve Replacement, Transfemoral INTRAOPERATIVE TRANSTHORACIC ECHOCARDIOGRAM  Patient Location: Cath Lab  Anesthesia Type:MAC  Level of Consciousness: awake, alert , and oriented  Airway & Oxygen Therapy: Patient Spontanous Breathing and Patient connected to face mask oxygen  Post-op Assessment: Report given to RN and Post -op Vital signs reviewed and stable  Post vital signs: Reviewed and stable  Last Vitals:  Vitals Value Taken Time  BP 116/61 12/16/23 1501  Temp    Pulse 63 12/16/23 1505  Resp 18 12/16/23 1505  SpO2 100 % 12/16/23 1505  Vitals shown include unfiled device data.  Last Pain:  Vitals:   12/16/23 1413  PainSc: Asleep      Patients Stated Pain Goal: 0 (12/16/23 1131)  Complications: There were no known notable events for this encounter.

## 2023-12-16 NOTE — Progress Notes (Signed)
  Echocardiogram 2D Echocardiogram has been performed.  Janalyn Harder 12/16/2023, 2:54 PM

## 2023-12-16 NOTE — Anesthesia Procedure Notes (Signed)
 Procedure Name: MAC Date/Time: 12/16/2023 1:25 PM  Performed by: Claudene Florina Boga, CRNAPre-anesthesia Checklist: Patient identified, Emergency Drugs available, Suction available and Patient being monitored Patient Re-evaluated:Patient Re-evaluated prior to induction Oxygen Delivery Method: Simple face mask Dental Injury: Teeth and Oropharynx as per pre-operative assessment

## 2023-12-16 NOTE — Discharge Summary (Addendum)
 [facility]ART [facility] VASCULAR CENTER   MULTIDISCIPLINARY HEART VALVE TEAM  Discharge Summary    Patient ID: ALILAH CORONADO MRN: [id number]; DOB: 1941/09/13  Admit date: 12/16/2023 Discharge date: 12/17/2023  Primary Care Provider: Lyle San, MD  Primary Cardiologist: Constancia Delton, MD / Dr. Lorie Rook & Dr. Honey Lusty (TAVR)  Discharge Diagnoses    Principal Problem:   S/P TAVR (transcatheter aortic valve replacement) Active Problems:   Hypokalemia   Hypothyroidism   COPD (chronic obstructive pulmonary disease) (HCC)   Essential hypertension   Dyslipidemia   GERD (gastroesophageal reflux disease)   Coronary artery disease involving native coronary artery of native heart   Aortic stenosis, severe   Chronic heart failure with preserved ejection fraction (HFpEF) (HCC)   Paroxysmal atrial fibrillation with RVR (HCC)   Dissection of right brachial artery (HCC)   Allergies Allergies  Allergen Reactions   Naproxen Sodium Anaphylaxis    Diagnostic Studies/Procedures    [facility]ART [facility] VASCULAR CENTER  TAVR OPERATIVE NOTE     Date of Procedure:                12/16/2023   Preoperative Diagnosis:      Severe Aortic Stenosis    Postoperative Diagnosis:    Same    Procedure:        Transcatheter Aortic Valve Replacement - Transfemoral Approach             Edwards Sapien 3 Resilia THV (size 23 mm, model # 9755RLS, serial # [id number] )              Co-Surgeons:                         Melene Sportsman, MD and Alyssa Backbone, MD Anesthesiologist:                  Gwenevere Lent, MD   Echocardiographer:              Gloriann Larger, MD   Pre-operative Echo Findings: Severe aortic stenosis Normal  left ventricular systolic function   Post-operative Echo Findings: Trace paravalvular leak Normal left ventricular systolic function   Left Heart Catheterization Findings: Left ventricular end-diastolic pressure of _____________    Echo 12/17/23: completed but pending formal  read at the time of discharge   History of Present Illness     Linda Brady is a 83 y.o. female with a history of CAD, COPD on 02, HTN, HLD, GERD, hyponatremia, hypokalemia, HFpEF, right axillary PSA s/p right brachial artery stenting (10/24/23), PAF on Eliquis , obesity (BMI 33), anemia, anxiety and severe AS who presented to [facility] on 12/16/23 for planned TAVR.   Ms. Pieczynski has been followed by Dr. Junnie Olives as an outpatient. She was admitted 09/2023 with chest pain and palpitations found to have new onset atrial fibrillation treated with IV Cardizem  with conversion to NSR. She was started on Eliquis  at that time. Echocardiogram during that admission showed LVEF of 65 to 70%, grade 2 diastolic dysfunction, mild MR, mild AI, severe aortic stenosis with a mean gradient of 49.2 mmHg.  She was seen in follow up and was doing well from an AF standpoint and was referred to the structural heart team for consideration of TAVR. [facility] 10/23/23 showed mild nonobstructive coronary artery disease. 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 She was seen by [facility] and underwent endovascular repair of right brachial  artery with Dr. Edgardo Goodwill. Post op she was started on DAPT and Eliquis  held. After 28 days she was transitioned back to Eliquis . She was set up for TAVR on 12/02/23 but this was cancelled due to hypokalemia. This was corrected and TAVR rebooked for 12/16/23.  Hospital Course     Consultants: none   Severe AS: s/p successful TAVR with a 23 mm Edwards Sapien 3 Ultra Resilia THV via the TF approach on 12/16/23. Post operative echo completed but pending formal read. Groin sites are stable. ECG with sinus and no high grade heart block. Resumed on home Eliquis  5mg  BID. Seen by cardiac rehab. Plan for discharge home today with close follow up in the outpatient setting.   PAF: in and out of afib during recent admissions. Briefly  in afib on arrival but converted back to sinus after TAVR. Continue amiodarone  200mg  BID. Will plan to decrease this to 200mg  daily in the outpatient setting. Resume home Eliquis  5mg  BID.   HFpEF: appears euvolemic. LVEDP 14 mmhg at the time of TAVR. Resume home lasix  20mg  daily.   Hypokalemia: continue KClor 40meq BID. Will recheck BMET next week   HTN: BP mildly elevated. Resume home antihypertensive regimen.   COPD: had some SOB and treated with a Dulera  inhaler. Would like this instead of home Symbicort . This has been called into her pharmacy   _____________  Discharge Vitals Blood pressure (!) 145/51, pulse 79, temperature 97.9 F (36.6 C), temperature source Oral, resp. rate (!) 22, height 5\' 3"  (1.6 m), weight 77.9 kg, SpO2 99%.  Filed Weights   12/16/23 1118 12/17/23 0554  Weight: 78.9 kg 77.9 kg     GEN: Well nourished, well developed, in no acute distress HEENT: normal Neck: no JVD or masses Cardiac: RRR; soft flow murmu No rubs, or gallops,no edema  Respiratory:  clear to auscultation bilaterally, normal work of breathing GI: soft, nontender, nondistended, + BS MS: no deformity or atrophy Skin: warm and dry, no rash.  Groin sites clear without hematoma or ecchymosis  Neuro:  Alert and Oriented x 3, Strength and sensation are intact Psych: euthymic mood, full affect  Disposition   Pt is being discharged home today in good condition.  Follow-up Plans & Appointments     Follow-up Information     Ardia Kraft, PA-C. Go on 12/26/2023.   Specialties: Cardiology, Radiology Why: @ 9:50am, please arrive at least 15 minutes early. Contact information: [address] [place] [address] [phone]                Discharge Instructions     Amb Referral to Cardiac Rehabilitation   Complete by: As directed    Diagnosis: Valve Replacement   Valve: Aortic Comment - TAVR   After initial evaluation and assessments completed: Virtual  Based Care may be provided alone or in conjunction with Phase 2 Cardiac Rehab based on patient barriers.: Yes   Intensive Cardiac Rehabilitation (ICR) [facility] location only OR Traditional Cardiac Rehabilitation (TCR) *If criteria for ICR are not met will enroll in TCR [facility] only): Yes       Discharge Medications   Allergies as of 12/17/2023       Reactions   Naproxen Sodium Anaphylaxis        Medication List     STOP taking these medications    budesonide -formoterol  160-4.5 MCG/ACT inhaler Commonly known as: Symbicort  Replaced by: mometasone -formoterol  200-5 MCG/ACT Aero       TAKE these medications  ALPRAZolam  0.25 MG tablet Commonly known as: XANAX  Take 0.25 mg by mouth at bedtime as needed for sleep.   amiodarone  200 MG tablet Commonly known as: PACERONE  Take 1 tablet (200 mg total) by mouth 2 (two) times daily.   atorvastatin  40 MG tablet Commonly known as: LIPITOR Take 1 tablet (40 mg total) by mouth daily.   cholecalciferol  25 MCG (1000 UNIT) tablet Commonly known as: VITAMIN D3 Take 1,000 Units by mouth daily.   Combivent  Respimat 20-100 MCG/ACT Aers respimat Generic drug: Ipratropium-Albuterol  Inhale 1 puff into the lungs every 6 (six) hours as needed for wheezing.   cyanocobalamin  1000 MCG tablet Commonly known as: VITAMIN B12 Take 1,000 mcg by mouth daily.   Eliquis  5 MG Tabs tablet Generic drug: apixaban  Take 5 mg by mouth 2 (two) times daily.   FLUoxetine  10 MG capsule Commonly known as: PROZAC  Take 2 capsules (20 mg total) by mouth daily as needed (anxiety).   furosemide  20 MG tablet Commonly known as: LASIX  Take 1 tablet (20 mg total) by mouth daily.   isosorbide  mononitrate 30 MG 24 hr tablet Commonly known as: IMDUR  Take 1 tablet (30 mg total) by mouth daily.   levalbuterol  1.25 MG/3ML nebulizer solution Commonly known as: XOPENEX  Take 1.25 mg by nebulization every 6 (six) hours as needed.   levothyroxine  75 MCG tablet Commonly  known as: SYNTHROID  Take 75 mcg by mouth daily before breakfast.   mometasone -formoterol  200-5 MCG/ACT Aero Commonly known as: DULERA  Inhale 2 puffs into the lungs 2 (two) times daily. Replaces: budesonide -formoterol  160-4.5 MCG/ACT inhaler   pantoprazole  40 MG tablet Commonly known as: PROTONIX  Take 1 tablet (40 mg total) by mouth 2 (two) times daily.   potassium chloride  SA 20 MEQ tablet Commonly known as: KLOR-CON  M Take 2 tablets (40 mEq total) by mouth 2 (two) times daily.   rOPINIRole  0.25 MG tablet Commonly known as: REQUIP  Take 1 tablet (0.25 mg total) by mouth at bedtime.   sucralfate  1 g tablet Commonly known as: Carafate  Take 1 tablet (1 g total) by mouth 4 (four) times daily.          Outstanding Labs/Studies   BMET  Duration of Discharge Encounter   Greater than 30 minutes including physician time.  Signed, Abagail Hoar, PA-C 12/17/2023, 11:02 AM [phone]   ATTENDING ATTESTATION:  After conducting a review of all available clinical information with the care team, interviewing the patient, and performing a physical exam, I agree with the findings and plan described in this note.   GEN: No acute distress.   HEENT:  MMM, no JVD, no scleral icterus Cardiac: RRR, no murmurs, rubs, or gallops.  Respiratory: Clear to auscultation bilaterally. GI: Soft, nontender, non-distended  MS: No edema; No deformity. Neuro:  Nonfocal  Vasc:  +2 radial pulses; access sites intact  Patient doing well after TAVR with stable access sites, no evidence of stroke, and no conduction abnormalities.  Still with dyspnea which was improved with inhaler.  Prelim TTE review shows gradient across TAVR ~ which is much increased vs yesterday ( ).  Too short a duration for HALT/HAM formation.  Will discharge today and monitor with serial TTEs.    Alyssa Backbone, MD Pager [phone]

## 2023-12-16 NOTE — Op Note (Addendum)
 HEART AND VASCULAR CENTER  TAVR OPERATIVE NOTE   Date of Procedure:  12/16/2023  Preoperative Diagnosis: Severe Aortic Stenosis   Postoperative Diagnosis: Same   Procedure:   Transcatheter Aortic Valve Replacement - Transfemoral Approach  Edwards Sapien 3 Resilia THV (size 23 mm, model # 9755RLS, serial # [id number] )   Co-Surgeons:   Deward Kallman, MD and Lurena Red, MD Anesthesiologist:  Garnette Skillern, MD  Echocardiographer:  Stanly Leavens, MD  Pre-operative Echo Findings: Severe aortic stenosis Normal  left ventricular systolic function  Post-operative Echo Findings: Trace paravalvular leak Normal left ventricular systolic function  Left Heart Catheterization Findings: Left ventricular end-diastolic pressure of   BRIEF CLINICAL NOTE AND INDICATIONS FOR SURGERY  The patient is an 83 year old female with a history of CAD, COPD,, hypertension, hyperlipidemia, GERD, right axillary pseudoaneurysm status post right brachial artery stenting, paroxysmal atrial fibrillation on Eliquis , obesity, and severe symptomatic aortic stenosis who is referred for elective transcatheter aortic valve replacement.  During the course of the patient's preoperative work up they have been evaluated comprehensively by a multidisciplinary team of specialists coordinated through the Multidisciplinary Heart Valve Clinic in the [facility].  They have been demonstrated to suffer from symptomatic severe aortic stenosis as noted above. The patient has been counseled extensively as to the relative risks and benefits of all options for the treatment of severe aortic stenosis including long term medical therapy, conventional surgery for aortic valve replacement, and transcatheter aortic valve replacement.  The patient has been independently evaluated by Dr. Kallman with CT surgery and they are felt to be at high risk for conventional surgical aortic valve replacement. The  surgeon indicated the patient would be a poor candidate for conventional surgery. Based upon review of all of the patient's preoperative diagnostic tests they are felt to be candidate for transcatheter aortic valve replacement using the transfemoral approach as an alternative to high risk conventional surgery.    Following the decision to proceed with transcatheter aortic valve replacement, a discussion has been held regarding what types of management strategies would be attempted intraoperatively in the event of life-threatening complications, including whether or not the patient would be considered a candidate for the use of cardiopulmonary bypass and/or conversion to open sternotomy for attempted surgical intervention.  The patient has been advised of a variety of complications that might develop peculiar to this approach including but not limited to risks of death, stroke, paravalvular leak, aortic dissection or other major vascular complications, aortic annulus rupture, device embolization, cardiac rupture or perforation, acute myocardial infarction, arrhythmia, heart block or bradycardia requiring permanent pacemaker placement, congestive heart failure, respiratory failure, renal failure, pneumonia, infection, other late complications related to structural valve deterioration or migration, or other complications that might ultimately cause a temporary or permanent loss of functional independence or other long term morbidity.  The patient provides full informed consent for the procedure as described and all questions were answered preoperatively.    DETAILS OF THE OPERATIVE PROCEDURE  PREPARATION:   The patient is brought to the operating room on the above mentioned date and central monitoring was established by the anesthesia team including placement of a radial arterial line. The patient is placed in the supine position on the operating table.  Intravenous antibiotics are administered. Conscious  sedation is used.   Baseline transthoracic echocardiogram was performed. The patient's chest, abdomen, both groins, and both lower extremities are prepared and draped in a sterile manner. A time out procedure is  performed.   PERIPHERAL ACCESS:   Using the modified Seldinger technique, femoral arterial and venous access were obtained with placement of a 6 Fr sheath in the right artery using u/s guidance.  A pigtail diagnostic catheter was passed through the femoral arterial sheath under fluoroscopic guidance into the aortic root.  The pacemaker was tested to ensure stable lead placement and pacemaker capture. Aortic root angiography was performed in order to determine the optimal angiographic angle for valve deployment.  TRANSFEMORAL ACCESS:  A micropuncture kit was used to gain access to the right femoral artery using u/s guidance. Position confirmed with angiography. Pre-closure with double ProGlide closure devices. The patient was heparinized systemically and ACT verified > 250 seconds.    A 14 Fr transfemoral E-sheath was introduced into the right femoral artery after progressively dilating over an Amplatz superstiff wire. An AL-1 catheter was used to direct a straight-tip exchange length wire across the native aortic valve into the left ventricle. This was exchanged out for a pigtail catheter and position was confirmed in the LV apex. Simultaneous left ventricular, aortic, and left ventricular end-diastolic pressures were recorded.  The pigtail catheter was then exchanged for an Safari wire in the LV apex.  Direct LV pacing thresholds were assessed and found to be adequate.   TRANSCATHETER HEART VALVE DEPLOYMENT:  An Edwards Sapien 3 THV (size 23 mm) was prepared and crimped per manufacturer's guidelines, and the proper orientation of the valve is confirmed on the [employer] delivery system. The valve was advanced through the introducer sheath using normal technique until in an appropriate  position in the abdominal aorta beyond the sheath tip. The balloon was then retracted and using the fine-tuning wheel was centered on the valve. The valve was then advanced across the aortic arch using appropriate flexion of the catheter. The valve was carefully positioned across the aortic valve annulus. The Commander catheter was retracted using normal technique. Once final position of the valve has been confirmed by angiographic assessment, the valve is deployed while temporarily holding ventilation and during rapid ventricular pacing to maintain systolic blood pressure < 50 mmHg and pulse pressure < 10 mmHg. The balloon inflation is held for >3 seconds after reaching full deployment volume. Once the balloon has fully deflated the balloon is retracted into the ascending aorta and valve function is assessed using TTE. There is felt to be no paravalvular leak and no central aortic insufficiency.  The patient's hemodynamic recovery following valve deployment is good.  The deployment balloon and guidewire are both removed. Echo demostrated acceptable post-procedural gradients, stable mitral valve function, and no AI.   PROCEDURE COMPLETION:  The sheath was then removed and closure devices were completed. Protamine  was administered once femoral arterial repair was complete. The temporary pacemaker, pigtail catheters and femoral sheaths were removed with  a Mynx closure device placed in the artery and manual pressure used for venous hemostasis.    The patient tolerated the procedure well and is transported to the surgical intensive care in stable condition. There were no immediate intraoperative complications. All sponge instrument and needle counts are verified correct at completion of the operation.   No blood products were administered during the operation.  The patient received a total of 80 mL of intravenous contrast during the procedure.  Kenlei Safi K Luisdavid Hamblin MD 12/16/2023 2:51 PM

## 2023-12-16 NOTE — Anesthesia Preprocedure Evaluation (Addendum)
 Anesthesia Evaluation  Patient identified by MRN, date of birth, ID band Patient awake    Reviewed: Allergy & Precautions, NPO status , Patient's Chart, lab work & pertinent test results  Airway Mallampati: II  TM Distance: >3 FB Neck ROM: Full    Dental  (+) Teeth Intact, Dental Advisory Given   Pulmonary COPD,  COPD inhaler and oxygen dependent, former smoker   Pulmonary exam normal breath sounds clear to auscultation       Cardiovascular hypertension, Pt. on medications + CAD  + dysrhythmias Atrial Fibrillation + Valvular Problems/Murmurs AS  Rhythm:Irregular Rate:Abnormal + Systolic murmurs    Neuro/Psych  PSYCHIATRIC DISORDERS Anxiety     negative neurological ROS     GI/Hepatic Neg liver ROS,GERD  Medicated,,  Endo/Other  Hypothyroidism  Obesity   Renal/GU negative Renal ROS     Musculoskeletal negative musculoskeletal ROS (+)    Abdominal   Peds  Hematology  (+) Blood dyscrasia (Eliquis )   Anesthesia Other Findings Day of surgery medications reviewed with the patient.  Reproductive/Obstetrics                             Anesthesia Physical Anesthesia Plan  ASA: 4  Anesthesia Plan: MAC   Post-op Pain Management: Tylenol  PO (pre-op)*   Induction: Intravenous  PONV Risk Score and Plan: 2 and TIVA, Dexamethasone and Ondansetron   Airway Management Planned: Natural Airway and Simple Face Mask  Additional Equipment:   Intra-op Plan:   Post-operative Plan:   Informed Consent: I have reviewed the patients History and Physical, chart, labs and discussed the procedure including the risks, benefits and alternatives for the proposed anesthesia with the patient or authorized representative who has indicated his/her understanding and acceptance.     Dental advisory given  Plan Discussed with: CRNA  Anesthesia Plan Comments: (Arterial line off groin stick )         Anesthesia Quick Evaluation

## 2023-12-16 NOTE — Anesthesia Postprocedure Evaluation (Signed)
 Anesthesia Post Note  Patient: Marijke Guadiana Indelicato  Procedure(s) Performed: Transcatheter Aortic Valve Replacement, Transfemoral INTRAOPERATIVE TRANSTHORACIC ECHOCARDIOGRAM     Patient location during evaluation: Cath Lab Anesthesia Type: MAC Level of consciousness: awake and alert Pain management: pain level controlled Vital Signs Assessment: post-procedure vital signs reviewed and stable Respiratory status: spontaneous breathing, nonlabored ventilation, respiratory function stable and patient connected to nasal cannula oxygen Cardiovascular status: stable and blood pressure returned to baseline Postop Assessment: no apparent nausea or vomiting Anesthetic complications: no   There were no known notable events for this encounter.  Last Vitals:  Vitals:   12/16/23 1537 12/16/23 1545  BP:  (!) 118/59  Pulse:  63  Resp:  (!) 21  Temp: (!) 35.6 C   SpO2:  98%    Last Pain:  Vitals:   12/16/23 1537  TempSrc: Temporal  PainSc:                  Garnette FORBES Skillern

## 2023-12-17 ENCOUNTER — Other Ambulatory Visit: Payer: Self-pay

## 2023-12-17 ENCOUNTER — Encounter (HOSPITAL_COMMUNITY): Payer: Self-pay | Admitting: Internal Medicine

## 2023-12-17 ENCOUNTER — Inpatient Hospital Stay (HOSPITAL_COMMUNITY): Payer: Medicare Other

## 2023-12-17 DIAGNOSIS — Z952 Presence of prosthetic heart valve: Secondary | ICD-10-CM

## 2023-12-17 LAB — CBC
HCT: 31.1 % — ABNORMAL LOW (ref 36.0–46.0)
Hemoglobin: 10.2 g/dL — ABNORMAL LOW (ref 12.0–15.0)
MCH: 31.4 pg (ref 26.0–34.0)
MCHC: 32.8 g/dL (ref 30.0–36.0)
MCV: 95.7 fL (ref 80.0–100.0)
Platelets: 259 10*3/uL (ref 150–400)
RBC: 3.25 MIL/uL — ABNORMAL LOW (ref 3.87–5.11)
RDW: 14.9 % (ref 11.5–15.5)
WBC: 8.9 10*3/uL (ref 4.0–10.5)
nRBC: 0 % (ref 0.0–0.2)

## 2023-12-17 LAB — ECHOCARDIOGRAM COMPLETE
AR max vel: 2.26 cm2
AV Area VTI: 2.12 cm2
AV Area mean vel: 2.22 cm2
AV Mean grad: 23 mm[Hg]
AV Peak grad: 35.5 mm[Hg]
Ao pk vel: 2.98 m/s
Area-P 1/2: 2.55 cm2
Est EF: >75
Height: 63 in
MV M vel: 5.33 m/s
MV Peak grad: 113.6 mm[Hg]
MV VTI: 2.61 cm2
S' Lateral: 2.7 cm
Weight: 2747.81 [oz_av]

## 2023-12-17 LAB — BASIC METABOLIC PANEL
Anion gap: 13 (ref 5–15)
BUN: 8 mg/dL (ref 8–23)
CO2: 23 mmol/L (ref 22–32)
Calcium: 9.1 mg/dL (ref 8.9–10.3)
Chloride: 96 mmol/L — ABNORMAL LOW (ref 98–111)
Creatinine, Ser: 0.93 mg/dL (ref 0.44–1.00)
GFR, Estimated: >60 mL/min (ref >60)
Glucose, Bld: 126 mg/dL — ABNORMAL HIGH (ref 70–99)
Potassium: 4.5 mmol/L (ref 3.5–5.1)
Sodium: 132 mmol/L — ABNORMAL LOW (ref 135–145)

## 2023-12-17 LAB — MAGNESIUM: Magnesium: 1.8 mg/dL (ref 1.7–2.4)

## 2023-12-17 MED ORDER — MOMETASONE FURO-FORMOTEROL FUM 200-5 MCG/ACT IN AERO: 2.0000 | INHALATION_SPRAY | Freq: Two times a day (BID) | RESPIRATORY_TRACT | 6 refills | Status: AC

## 2023-12-17 MED ORDER — IPRATROPIUM-ALBUTEROL 0.5-2.5 (3) MG/3ML IN SOLN
RESPIRATORY_TRACT | Status: AC
  Filled 2023-12-17: qty 3

## 2023-12-17 NOTE — Progress Notes (Signed)
 Discharge instructions reviewed with pt and her daughter.  Copy of instructions given to pt. Pt has 1 new script, MDI, pt informed it was sent to her pharmacy to be picked up. Pt and daughter did not have any questions. Pt getting dressed at this time.  Pt will be d/c'd via wheelchair with belongings, with daughter and will be escorted by staff.   Quinnten Calvin,RN SWOT

## 2023-12-17 NOTE — Progress Notes (Signed)
 Mobility Specialist Progress Note:   12/17/23 1027  Mobility  Activity Ambulated with assistance in hallway  Level of Assistance Contact guard assist, steadying assist  Assistive Device Front wheel walker  Distance Ambulated (ft) 120 ft  Activity Response Tolerated well  Mobility Referral Yes  Mobility visit 1 Mobility  Mobility Specialist Start Time (ACUTE ONLY) 1020  Mobility Specialist Stop Time (ACUTE ONLY) 1027  Mobility Specialist Time Calculation (min) (ACUTE ONLY) 7 min   Pt received in chair, agreeable to mobility session. Ambulated in hallway with RW and CGA for safety. Tolerated well, asx throughout, SpO2 90-94% on 2L. Returned pt to room, sitting comfortably in chair, left with all needs met.   Pearline Yerby Mobility Specialist Please contact via Special educational needs teacher or  Rehab office at [phone]

## 2023-12-17 NOTE — Progress Notes (Signed)
 Echocardiogram 2D Echocardiogram has been performed.  Emmaline Haring Lael Pilch RDCS 12/17/2023, 9:59 AM

## 2023-12-17 NOTE — Progress Notes (Signed)
 Pt moved to [facility] then walked around EOB to recliner. No major c/o. MT to see later. She walks in the house at baseline, has a RW, on 2L, has had HHPT and would like again.   Discussed walking at home, restrictions, CRPII and then eventually Pulmonary Rehab. Receptive. Will refer to [facility] CRPII.  [phone] German Koller BS, ACSM-CEP 12/17/2023 10:07 AM

## 2023-12-17 NOTE — Op Note (Signed)
 HEART AND VASCULAR CENTER   MULTIDISCIPLINARY HEART VALVE TEAM   TAVR OPERATIVE NOTE   Date of Procedure:  12/17/2023  Preoperative Diagnosis: Severe Aortic Stenosis   Postoperative Diagnosis: Same   Procedure:   Transcatheter Aortic Valve Replacement - Percutaneous right Transfemoral Approach  Edwards Sapien 3 Ultra THV (size 23 mm, model # [id number],)   Co-Surgeons:  Melene Sportsman MD and  Alyssa Backbone, MD   Anesthesiologist:  Gwenevere Lent MD  Echocardiographer:  Dr Paulita Boss  Pre-operative Echo Findings: Severe aortic stenosis normal left ventricular systolic function  Post-operative Echo Findings: no paravalvular leak normal left ventricular systolic function   BRIEF CLINICAL NOTE AND INDICATIONS FOR SURGERY  83 yo female with NYHA class 2-3 symptoms of severe AS with normal LV function and without CAD however frail with advanced age, COPD, CRI with recent afib and CHF     DETAILS OF THE OPERATIVE PROCEDURE  PREPARATION:    The patient was brought to the operating room on the above mentioned date and appropriate monitoring was established by the anesthesia team. The patient was placed in the supine position on the operating table.  Intravenous antibiotics were administered. The patient was monitored closely throughout the procedure under conscious sedation.   Baseline transthoracic echocardiogram was performed. The patient's abdomen and both groins were prepped and draped in a sterile manner. A time out procedure was performed.   PERIPHERAL ACCESS:    Using the modified Seldinger technique, femoral arterial  access was obtained with placement of 6 Fr sheaths on the left side.  A pigtail diagnostic catheter was passed through the left arterial sheath under fluoroscopic guidance into the aortic root.   Aortic root angiography was performed in order to determine the optimal angiographic angle for valve deployment.   TRANSFEMORAL ACCESS:   Percutaneous  transfemoral access and sheath placement was performed using ultrasound guidance.  The right common femoral artery was cannulated using a micropuncture needle and appropriate location was verified using hand injection angiogram.  A pair of Abbott Perclose percutaneous closure devices were placed and a 6 French sheath replaced into the femoral artery.  The patient was heparinized systemically and ACT verified > 250 seconds.    A 14 Fr transfemoral E-sheath was introduced into the right common femoral artery after progressively dilating over an Amplatz superstiff wire. An AL2 catheter was used to direct a straight-tip exchange length wire across the native aortic valve into the left ventricle. This was exchanged out for a pigtail catheter and position was confirmed in the LV apex. Simultaneous LV and Ao pressures were recorded.  The pigtail catheter was exchanged for a Safari wire in the LV apex. This was used for direct LV pacing and the pacemaker was tested to ensure stable lead placement and pacemaker capture.   BALLOON AORTIC VALVULOPLASTY:   Balloon aortic valvuloplasty was performed using a 12 mm valvuloplasty balloon.  Once optimal position was achieved, BAV was done under rapid ventricular pacing. The patient recovered well hemodynamically.    TRANSCATHETER HEART VALVE DEPLOYMENT:   An Edwards Sapien 3 Ultra transcatheter heart valve (size 23 mm) was prepared and crimped per manufacturer's guidelines, and the proper orientation of the valve is confirmed on the [employer] delivery system. The valve was advanced through the introducer sheath using normal technique until in an appropriate position in the abdominal aorta beyond the sheath tip. The balloon was then retracted and using the fine-tuning wheel was centered on the valve. The valve was then advanced across  the aortic arch using appropriate flexion of the catheter. The valve was carefully positioned across the aortic valve annulus. The  Commander catheter was retracted using normal technique. Once final position of the valve has been confirmed by angiographic assessment, the valve is deployed during rapid ventricular pacing to maintain systolic blood pressure < 50 mmHg and pulse pressure < 10 mmHg. The balloon inflation is held for >3 seconds after reaching full deployment volume. Once the balloon has fully deflated the balloon is retracted into the ascending aorta and valve function is assessed using echocardiography. There is felt to be no paravalvular leak and no central aortic insufficiency.  The patient's hemodynamic recovery following valve deployment is good.  The deployment balloon and guidewire are both removed.    PROCEDURE COMPLETION:   The sheath was removed and femoral artery closure performed.  Protamine  was administered once femoral arterial repair was complete. The  pigtail catheter and femoral sheaths were removed with manual pressure used for venous hemostasis.  A Mynx femoral closure device was utilized following removal of the diagnostic sheath in the left femoral artery.  The patient tolerated the procedure well and is transported to the cath lab recovery area in stable condition. There were no immediate intraoperative complications. All sponge instrument and needle counts are verified correct at completion of the operation.   No blood products were administered during the operation.  The patient received a total of 80 mL of intravenous contrast during the procedure.   Melene Sportsman, MD 12/17/2023 7:58 AM

## 2023-12-18 ENCOUNTER — Telehealth: Payer: Self-pay | Admitting: Physician Assistant

## 2023-12-18 NOTE — Telephone Encounter (Signed)
  HEART AND VASCULAR CENTER   MULTIDISCIPLINARY HEART VALVE TEAM   Patient contacted regarding discharge from [facility] on 12/17/23  Patient understands to follow up with a structural heart APP on 12/26/23 at [address] [facility]  Patient understands discharge instructions? yes Patient understands medications and regimen? yes Patient understands to bring all medications to this visit? yes  Cline Crock PA-C  [facility]

## 2023-12-22 ENCOUNTER — Other Ambulatory Visit: Payer: Self-pay | Admitting: *Deleted

## 2023-12-25 NOTE — Progress Notes (Unsigned)
HEART AND VASCULAR CENTER   MULTIDISCIPLINARY HEART VALVE CLINIC                                     Cardiology Office Note:    Date:  12/27/2023   ID:  [name], DOB Jun 07, 1941, MRN [id number]  PCP:  [name], MD  [facility] Cardiologist:  Debbe Odea, MD / Dr. Lynnette Caffey & Dr. Leafy Ro (TAVR)  [facility] Electrophysiologist:  None   Referring MD: [name], MD   [facility] s/p TAVR  History of Present Illness:    Linda Brady is a 83 y.o. 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  Linda Brady has been followed by Dr. Azucena Cecil as an outpatient. She was admitted 09/2023 with chest pain and palpitations found to have new onset atrial fibrillation treated with IV Cardizem with conversion to NSR. She was started on Eliquis at that time. Echocardiogram during that admission showed LVEF of 65 to 70%, G2DD, mild MR, mild AI, and severe aortic stenosis with a mean gradient of 49.2 mmHg.  She was seen in follow up and was doing well from an AF standpoint and was referred to the structural heart team for consideration of TAVR. [facility] 10/23/23 showed mild nonobstructive coronary artery disease. 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 She was seen by [facility] and underwent endovascular repair of right brachial artery with Dr. Lenell Antu. Post op she was started on DAPT and Eliquis held. After 28 days she was transitioned back to Eliquis. She was set up for TAVR on 12/02/23 but this was cancelled due to hypokalemia. This was corrected and she underwent successful TAVR with a 23 mm Edwards Sapien 3 Ultra Resilia THV via the TF approach on 12/16/23. Post operative echo  showed EF >75%, mod LVH, normally functioning TAVR with a mean gradient of 23 mmHg and trivial PVL, gradients felt to be elevated 2/2 high flow state. She was briefly in afib on arrival to the hospital but converted back to sinus after TAVR. She was resumed on Eliquis 5mg  BID.   Today the patient presents to clinic for follow up. Here with her daughter, [name]. She is not doing well at all. Still very short of breath with only marginal improvement since TAVR. No CP. No LE edema, orthopnea or PND. No dizziness or syncope. No blood in stool or urine. No palpitations.      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 Lynnette Caffey and Dr. Leafy Ro   Thyroid disease      Current Medications: Current Meds  Medication Sig   ALPRAZolam (XANAX) 0.25 MG tablet Take 0.25 mg by mouth at bedtime as needed for sleep.   amiodarone (PACERONE) 200 MG tablet Take 1 tablet (200 mg total) by mouth 2 (two)  times daily.   amoxicillin (AMOXIL) 500 MG tablet Take 4 tablets (2,000 mg total) by mouth as directed. 1 hour prior to dental work including cleanings   apixaban (ELIQUIS) 5 MG TABS tablet Take 5 mg by mouth 2 (two) times daily.   atorvastatin (LIPITOR) 40 MG tablet Take 1 tablet (40 mg total) by mouth daily.   cholecalciferol (VITAMIN D3) 25 MCG (1000 UNIT) tablet Take 1,000 Units by mouth daily.   FLUoxetine (PROZAC) 10 MG capsule Take 2 capsules (20 mg total) by mouth daily as needed (anxiety).   furosemide (LASIX) 20 MG tablet Take 1 tablet (20 mg total) by mouth daily.   Ipratropium-Albuterol (COMBIVENT RESPIMAT) 20-100 MCG/ACT AERS respimat Inhale 1 puff into the lungs every 6 (six) hours as needed for  wheezing.   isosorbide mononitrate (IMDUR) 30 MG 24 hr tablet Take 1 tablet (30 mg total) by mouth daily.   levalbuterol (XOPENEX) 1.25 MG/3ML nebulizer solution Take 1.25 mg by nebulization every 6 (six) hours as needed.   levothyroxine (SYNTHROID) 75 MCG tablet Take 75 mcg by mouth daily before breakfast.   mometasone-formoterol (DULERA) 200-5 MCG/ACT AERO Inhale 2 puffs into the lungs 2 (two) times daily.   pantoprazole (PROTONIX) 40 MG tablet Take 1 tablet (40 mg total) by mouth 2 (two) times daily.   potassium chloride SA (KLOR-CON M) 20 MEQ tablet Take 2 tablets (40 mEq total) by mouth daily.   spironolactone (ALDACTONE) 25 MG tablet Take 1 tablet (25 mg total) by mouth daily.   sucralfate (CARAFATE) 1 g tablet Take 1 tablet (1 g total) by mouth 4 (four) times daily.   vitamin B-12 (CYANOCOBALAMIN) 1000 MCG tablet Take 1,000 mcg by mouth daily.   [DISCONTINUED] potassium chloride SA (KLOR-CON M) 20 MEQ tablet Take 2 tablets (40 mEq total) by mouth 2 (two) times daily.      ROS:   Please see the history of present illness.    All other systems reviewed and are negative.  EKGs   EKG Interpretation Date/Time:  Friday December 26 2023 10:02:18 EST Ventricular Rate:  69 PR Interval:  226 QRS Duration:  110 QT Interval:  440 QTC Calculation: 471 R Axis:   60  Text Interpretation: Sinus rhythm with 1st degree A-V block Anteroseptal infarct Confirmed by Cline Crock [phone]) on 12/26/2023 10:10:31 AM   Risk Assessment/Calculations:    CHA2DS2-VASc Score = 5   This indicates a 7.2% annual risk of stroke. The patient's score is based upon: CHF History: 0 HTN History: 1 Diabetes History: 0 Stroke History: 0 Vascular Disease History: 1 Age Score: 2 Gender Score: 1          Physical Exam:    VS:  BP 130/88   Pulse 69   Ht 5\' 3"  (1.6 m)   Wt 174 lb (78.9 kg)   SpO2 94%   BMI 30.82 kg/m     Wt Readings from Last 3 Encounters:  12/26/23 174 lb (78.9 kg)  12/17/23  171 lb 11.8 oz (77.9 kg)  12/10/23 174 lb 12.8 oz (79.3 kg)     GEN: Well nourished, well developed in no acute distress NECK: No JVD CARDIAC: RRR, 2/6 flow murmur @ RUSB. No rubs, gallops RESPIRATORY:  Clear to auscultation without rales, wheezing or rhonchi  ABDOMEN: Soft, non-tender, non-distended EXTREMITIES:  No edema; No deformity.  Groin sites clear without hematoma or ecchymosis.  ASSESSMENT:    1. S/P TAVR (transcatheter aortic valve replacement)   2. PAF (paroxysmal atrial fibrillation) (  HCC)   3. Chronic heart failure with preserved ejection fraction (HCC)   4. Hypokalemia   5. Essential hypertension   6. Chronic obstructive pulmonary disease, unspecified COPD type (HCC)   7. Anemia, unspecified type   8. Thrombocytosis     PLAN:    In order of problems listed above:  Severe AS s/p TAVR: pt doing okay s/p TAVR. Continues to have SOB. Gradients across the valve noted to be elevated due to a high output state on POD1 echo. Hg is only mildly low. Will check TSH. ECG with no HAVB. Groin sites healing well. SBE prophylaxis discussed; I have RX'd amoxicillin. Continue Eliquis 5mg  BID. I will see back for 1 month echo and OV.   PAF: in sinus rhythm today. Continue home Eliquis 5mg  BID and amiodarone to 200mg  BID. Will decrease this to 200mg  daily at next visit.    HFpEF: appears euvolemic. Continue lasix 20mg  daily. Still having SOB. She does not look volume overloaded but will check a BNP to rule out occult CHF.    Hypokalemia: she has been on KClor BID. BMET was check by PCP on 1/22 and 3.8. These pills have been difficult for her to swallow. Given mildly elevated BP, shortness of breath and issues with hypokalemia, will add spiro 25mg  daily and cut back to of Kclor a day. Will get a repeat BMEt at her next visit.    HTN: BP 130/88 today. Continue Imdur 30mg  daily and Lasix 20mg  daily. Add spiro 25mg  daily to help with BP and hypokalemia.     COPD:  stable  Anemia/thrombocytosis: CBC 1/22 showed Hg 10.5 and PLT 489. Defer to PCP      Cardiac Rehabilitation Eligibility Assessment  The patient is ready to start cardiac rehabilitation from a cardiac standpoint.     Medication Adjustments/Labs and Tests Ordered: Current medicines are reviewed at length with the patient today.  Concerns regarding medicines are outlined above.  Orders Placed This Encounter  Procedures   Pro b natriuretic peptide (BNP)   TSH   EKG 12-Lead   Meds ordered this encounter  Medications   amoxicillin (AMOXIL) 500 MG tablet    Sig: Take 4 tablets (2,000 mg total) by mouth as directed. 1 hour prior to dental work including cleanings    Dispense:  12 tablet    Refill:  12    Supervising Provider:   COOPER, MICHAEL [[id number]]   potassium chloride SA (KLOR-CON M) 20 MEQ tablet    Sig: Take 2 tablets (40 mEq total) by mouth daily.    Dispense:  90 tablet    Refill:  3   spironolactone (ALDACTONE) 25 MG tablet    Sig: Take 1 tablet (25 mg total) by mouth daily.    Dispense:  90 tablet    Refill:  3    Patient Instructions  Medication Instructions:  Please START taking spironolactone 25 mg daily.  Please DECREASE your dose of potassium/KlorCon. Take 40 MEq one time a day instead of twice daily.   Please remember to take your amoxicillin before any dental procedures.  *If you need a refill on your cardiac medications before your next appointment, please call your pharmacy*   Lab Work: Please complete a BNP and a TSH at the [facility] on the first floor of our building before you leave.  If you have labs (blood work) drawn today and your tests are completely normal, you will receive your results only by: MyChart Message (if you have  MyChart) OR A paper copy in the mail If you have any lab test that is abnormal or we need to change your treatment, we will call you to review the results.   Testing/Procedures: Your physician has requested that you have  an echocardiogram on 01/16/24 at 9:15 AM. Echocardiography is a painless test that uses sound waves to create images of your heart. It provides your doctor with information about the size and shape of your heart and how well your heart's chambers and valves are working. This procedure takes approximately one hour. There are no restrictions for this procedure. Please do NOT wear cologne, perfume, aftershave, or lotions (deodorant is allowed). Please arrive 15 minutes prior to your appointment time.  Please note: We ask at that you not bring children with you during ultrasound (echo/ vascular) testing. Due to room size and safety concerns, children are not allowed in the ultrasound rooms during exams. Our front office staff cannot provide observation of children in our lobby area while testing is being conducted. An adult accompanying a patient to their appointment will only be allowed in the ultrasound room at the discretion of the ultrasound technician under special circumstances. We apologize for any inconvenience.    Follow-Up: At [facility], you and your health needs are our priority.  As part of our continuing mission to provide you with exceptional heart care, we have created designated Provider Care Teams.  These Care Teams include your primary Cardiologist (physician) and Advanced Practice Providers (APPs -  Physician Assistants and Nurse Practitioners) who all work together to provide you with the care you need, when you need it.  We recommend signing up for the patient portal called "MyChart".  Sign up information is provided on this After Visit Summary.  MyChart is used to connect with patients for Virtual Visits (Telemedicine).  Patients are able to view lab/test results, encounter notes, upcoming appointments, etc.  Non-urgent messages can be sent to your provider as well.   To learn more about what you can do with MyChart, go to [email or web address].    Your next appointment  will be 01/16/24 at 10:15 AM with:     Provider:   Carlean Jews, PA-C            Signed, Cline Crock, PA-C  12/27/2023 4:53 AM    [facility]

## 2023-12-26 ENCOUNTER — Ambulatory Visit: Payer: Medicare Other | Attending: Physician Assistant | Admitting: Physician Assistant

## 2023-12-26 VITALS — BP 130/88 | HR 69 | Ht 63.0 in | Wt 174.0 lb

## 2023-12-26 DIAGNOSIS — J449 Chronic obstructive pulmonary disease, unspecified: Secondary | ICD-10-CM | POA: Diagnosis present

## 2023-12-26 DIAGNOSIS — Z952 Presence of prosthetic heart valve: Secondary | ICD-10-CM | POA: Insufficient documentation

## 2023-12-26 DIAGNOSIS — D649 Anemia, unspecified: Secondary | ICD-10-CM | POA: Insufficient documentation

## 2023-12-26 DIAGNOSIS — D75839 Thrombocytosis, unspecified: Secondary | ICD-10-CM | POA: Diagnosis present

## 2023-12-26 DIAGNOSIS — I10 Essential (primary) hypertension: Secondary | ICD-10-CM | POA: Insufficient documentation

## 2023-12-26 DIAGNOSIS — Z79899 Other long term (current) drug therapy: Secondary | ICD-10-CM

## 2023-12-26 DIAGNOSIS — I480 Paroxysmal atrial fibrillation: Secondary | ICD-10-CM | POA: Insufficient documentation

## 2023-12-26 DIAGNOSIS — E876 Hypokalemia: Secondary | ICD-10-CM | POA: Diagnosis not present

## 2023-12-26 DIAGNOSIS — I5032 Chronic diastolic (congestive) heart failure: Secondary | ICD-10-CM | POA: Diagnosis not present

## 2023-12-26 MED ORDER — SPIRONOLACTONE 25 MG PO TABS: 25.0000 mg | ORAL_TABLET | Freq: Every day | ORAL | 3 refills | Status: DC

## 2023-12-26 MED ORDER — POTASSIUM CHLORIDE CRYS ER 20 MEQ PO TBCR: 40.0000 meq | EXTENDED_RELEASE_TABLET | Freq: Every day | ORAL | 3 refills | Status: DC

## 2023-12-26 MED ORDER — AMOXICILLIN 500 MG PO TABS: 2000.0000 mg | ORAL_TABLET | ORAL | 12 refills | Status: DC

## 2023-12-26 NOTE — Patient Instructions (Signed)
Medication Instructions:  Please START taking spironolactone 25 mg daily.  Please DECREASE your dose of potassium/KlorCon. Take 40 MEq one time a day instead of twice daily.   Please remember to take your amoxicillin before any dental procedures.  *If you need a refill on your cardiac medications before your next appointment, please call your pharmacy*   Lab Work: Please complete a BNP and a TSH at the [facility] on the first floor of our building before you leave.  If you have labs (blood work) drawn today and your tests are completely normal, you will receive your results only by: MyChart Message (if you have MyChart) OR A paper copy in the mail If you have any lab test that is abnormal or we need to change your treatment, we will call you to review the results.   Testing/Procedures: Your physician has requested that you have an echocardiogram on 01/16/24 at 9:15 AM. Echocardiography is a painless test that uses sound waves to create images of your heart. It provides your doctor with information about the size and shape of your heart and how well your heart's chambers and valves are working. This procedure takes approximately one hour. There are no restrictions for this procedure. Please do NOT wear cologne, perfume, aftershave, or lotions (deodorant is allowed). Please arrive 15 minutes prior to your appointment time.  Please note: We ask at that you not bring children with you during ultrasound (echo/ vascular) testing. Due to room size and safety concerns, children are not allowed in the ultrasound rooms during exams. Our front office staff cannot provide observation of children in our lobby area while testing is being conducted. An adult accompanying a patient to their appointment will only be allowed in the ultrasound room at the discretion of the ultrasound technician under special circumstances. We apologize for any inconvenience.    Follow-Up: At [facility], you and your  health needs are our priority.  As part of our continuing mission to provide you with exceptional heart care, we have created designated Provider Care Teams.  These Care Teams include your primary Cardiologist (physician) and Advanced Practice Providers (APPs -  Physician Assistants and Nurse Practitioners) who all work together to provide you with the care you need, when you need it.  We recommend signing up for the patient portal called "MyChart".  Sign up information is provided on this After Visit Summary.  MyChart is used to connect with patients for Virtual Visits (Telemedicine).  Patients are able to view lab/test results, encounter notes, upcoming appointments, etc.  Non-urgent messages can be sent to your provider as well.   To learn more about what you can do with MyChart, go to [email or web address].    Your next appointment will be 01/16/24 at 10:15 AM with:     Provider:   Carlean Jews, PA-C

## 2023-12-27 LAB — PRO B NATRIURETIC PEPTIDE: NT-Pro BNP: 323 pg/mL (ref 0–738)

## 2023-12-27 LAB — TSH: TSH: 4.03 u[IU]/mL (ref 0.450–4.500)

## 2023-12-31 ENCOUNTER — Encounter (HOSPITAL_COMMUNITY): Payer: Self-pay | Admitting: Internal Medicine

## 2024-01-02 ENCOUNTER — Ambulatory Visit: Admitting: Cardiology

## 2024-01-09 ENCOUNTER — Other Ambulatory Visit (HOSPITAL_COMMUNITY): Payer: Medicare Other

## 2024-01-09 ENCOUNTER — Ambulatory Visit: Payer: Medicare Other

## 2024-01-14 NOTE — Addendum Note (Signed)
Addended by: Gunnar Fusi A on: 01/14/2024 09:50 AM   Modules accepted: Orders

## 2024-01-15 NOTE — Progress Notes (Unsigned)
[facility]   MULTIDISCIPLINARY HEART VALVE CLINIC                                     Cardiology Office Note:    Date:  01/16/2024   ID:  ASPEN LAWRANCE, DOB 1941-02-23, MRN [id number]  PCP:  [name], MD  [facility] Cardiologist:  Debbe Odea, MD / Dr. Lynnette Caffey & Dr. Leafy Ro (TAVR)  [facility] Electrophysiologist:  None   Referring MD: [name], MD   1 month s/p TAVR  History of Present Illness:    Linda Brady is a 83 y.o. 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  Ms. Athens has been followed by Dr. Azucena Cecil as an outpatient. She was admitted 09/2023 with chest pain and palpitations found to have new onset atrial fibrillation treated with IV Cardizem with conversion to NSR. She was started on Eliquis at that time. Echocardiogram during that admission showed LVEF of 65 to 70%, G2DD, mild MR, mild AI, and severe aortic stenosis with a mean gradient of 49.2 mmHg.  She was seen in follow up and was doing well from an AF standpoint and was referred to the structural heart team for consideration of TAVR. [facility] 10/23/23 showed mild nonobstructive coronary artery disease. 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 She was seen by [facility] and underwent endovascular repair of right brachial artery with Dr. Lenell Antu. Post op she was started on DAPT and Eliquis held. After 28 days she was transitioned back to Eliquis. She was set up for TAVR on 12/02/23 but this was cancelled due to hypokalemia. This was corrected and she underwent successful TAVR with a 23 mm Edwards Sapien 3 Ultra Resilia THV via the TF approach on 12/16/23. Post operative  echo showed EF >75%, mod LVH, normally functioning TAVR with a mean gradient of 23 mmHg and trivial PVL, gradients felt to be elevated 2/2 high flow state. She was briefly in afib on arrival to the hospital but converted back to sinus after TAVR. She was resumed on Eliquis 5mg  BID.   At follow up she was not doing well and complained of continued SOB and feeling unwell. Follow up labs showed normal TSH and BNP (personally reviewed). BP was elevated and spiro 25 mg daily was added. Kdur reduced from 40mg  BID to daily.   Today the patient presents to clinic for follow up. Here with her daughter, Randa Evens. Doing a little better but still remains very sedentary. Interested in CRP2 but cannot get there three times a week because daughter is ride and she works. She does admit to a mild improvement in SOB but not where she thought she might be. No CP. No LE edema, orthopnea or PND. No dizziness or syncope. No blood in stool or urine. No palpitations.   Past Medical History:  Diagnosis Date   A-fib [facility])    CHF (congestive heart failure) (HCC)    COPD (chronic obstructive pulmonary disease) (HCC)    GERD (gastroesophageal reflux disease)    Hypertension    Hypothyroidism    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  s/p TAVR with a 23 mm Edwards S3UR via TF approach by Dr. Lynnette Caffey and Dr. Leafy Ro   Thyroid disease      Current Medications: Current Meds  Medication Sig   ALPRAZolam (XANAX) 0.25 MG tablet Take 0.25 mg by mouth at bedtime as needed for sleep.   amoxicillin (AMOXIL) 500 MG tablet Take 4 tablets (2,000 mg total) by mouth as directed. 1 hour prior to dental work including cleanings   apixaban (ELIQUIS) 5 MG TABS tablet Take 5 mg by mouth 2 (two) times daily.   atorvastatin (LIPITOR) 40 MG tablet Take 1 tablet (40 mg total) by mouth daily.   cholecalciferol (VITAMIN D3) 25 MCG (1000 UNIT) tablet Take 1,000 Units by mouth daily.   FLUoxetine (PROZAC) 10 MG capsule Take  2 capsules (20 mg total) by mouth daily as needed (anxiety).   furosemide (LASIX) 20 MG tablet Take 1 tablet (20 mg total) by mouth daily.   Ipratropium-Albuterol (COMBIVENT RESPIMAT) 20-100 MCG/ACT AERS respimat Inhale 1 puff into the lungs every 6 (six) hours as needed for wheezing.   isosorbide mononitrate (IMDUR) 30 MG 24 hr tablet Take 1 tablet (30 mg total) by mouth daily.   levalbuterol (XOPENEX) 1.25 MG/3ML nebulizer solution Take 1.25 mg by nebulization every 6 (six) hours as needed.   levothyroxine (SYNTHROID) 75 MCG tablet Take 75 mcg by mouth daily before breakfast.   mometasone-formoterol (DULERA) 200-5 MCG/ACT AERO Inhale 2 puffs into the lungs 2 (two) times daily.   pantoprazole (PROTONIX) 40 MG tablet Take 1 tablet (40 mg total) by mouth 2 (two) times daily.   potassium chloride SA (KLOR-CON M) 20 MEQ tablet Take 2 tablets (40 mEq total) by mouth daily.   spironolactone (ALDACTONE) 25 MG tablet Take 1 tablet (25 mg total) by mouth daily.   sucralfate (CARAFATE) 1 g tablet Take 1 tablet (1 g total) by mouth 4 (four) times daily.   vitamin B-12 (CYANOCOBALAMIN) 1000 MCG tablet Take 1,000 mcg by mouth daily.   [DISCONTINUED] amiodarone (PACERONE) 200 MG tablet Take 1 tablet (200 mg total) by mouth 2 (two) times daily.      ROS:   Please see the history of present illness.    All other systems reviewed and are negative.  EKGs       Risk Assessment/Calculations:    CHA2DS2-VASc Score = 5   This indicates a 7.2% annual risk of stroke. The patient's score is based upon: CHF History: 0 HTN History: 1 Diabetes History: 0 Stroke History: 0 Vascular Disease History: 1 Age Score: 2 Gender Score: 1          Physical Exam:    VS:  BP 120/70 (BP Location: Left Arm)   Pulse 66   Ht 5\' 3"  (1.6 m)   Wt 171 lb 6.4 oz (77.7 kg)   SpO2 96%   BMI 30.36 kg/m     Wt Readings from Last 3 Encounters:  01/16/24 171 lb 6.4 oz (77.7 kg)  12/26/23 174 lb (78.9 kg)  12/17/23  171 lb 11.8 oz (77.9 kg)     GEN: Well nourished, well developed, in no acute distress HEENT: normal Neck: no JVD or masses Cardiac: RRR; 2/6 flow murmur @ RUSB. No rubs, or gallops,no edema  Respiratory:  clear to auscultation bilaterally, normal work of breathing GI: soft, nontender, nondistended, + BS MS: no deformity or atrophy Skin: warm and dry, no rash Neuro:  Alert and Oriented x 3, Strength and sensation are intact Psych: euthymic mood,  full affect   ASSESSMENT:    1. S/P TAVR (transcatheter aortic valve replacement)   2. PAF (paroxysmal atrial fibrillation) (HCC)   3. Chronic heart failure with preserved ejection fraction (HCC)   4. Hypokalemia   5. Essential hypertension   6. Chronic obstructive pulmonary disease, unspecified COPD type (HCC)   7. Anemia, unspecified type   8. Thrombocytosis   9. Dissection of right brachial artery (HCC)     PLAN:    In order of problems listed above:  Severe AS s/p TAVR: echo today shows EF 60%, normally functioning TAVR with a mean gradient of 14 mm hg and mild to mod PVL (seen in 5 chamber views).  -- She has NYHA class II symptoms with mild improvement since TAVR. -- Extremely sedentary. Encouraged cardiac rehab, even if she cannot make all visits. -- SBE prophylaxis discussed; she has amoxicillin.  -- Continue Eliquis 5mg  BID.  -- I will see back for 1 year echo and OV.   PAF: sounds regular on exam today.  -- Continue home Eliquis 5mg  BID  -- Decrease amiodarone to 200mg  BID to amiodarone 200mg  daily.    HFpEF: appears euvolemic.  -- Continue lasix 20mg  daily.    Hypokalemia: continue Kdur 40mg  daily and spiro 25mg  daily.  -- BMET today   HTN: BP 120/70 today.  -- Continue Imdur 30mg  daily, Lasix 20mg  daily and spiro 25mg  daily.   COPD: stable  Anemia/thrombocytosis: CBC 1/22 showed Hg 10.5 and PLT 489. Defer to PCP   Right axillary PSA: developed after cath s/p right brachial artery stenting (10/24/23). -- Has  follow up with [facility] 05/25/24    Cardiac Rehabilitation Eligibility Assessment  The patient is ready to start cardiac rehabilitation from a cardiac standpoint.     Medication Adjustments/Labs and Tests Ordered: Current medicines are reviewed at length with the patient today.  Concerns regarding medicines are outlined above.  Orders Placed This Encounter  Procedures   Basic Metabolic Panel (BMET)   Meds ordered this encounter  Medications   amiodarone (PACERONE) 200 MG tablet    Sig: Take 1 tablet (200 mg total) by mouth daily.    Supervising Provider:   Tonny Bollman [[id number]]    Patient Instructions  Medication Instructions:  Your physician recommends that you continue on your current medications as directed. Please refer to the Current Medication list given to you today.  *If you need a refill on your cardiac medications before your next appointment, please call your pharmacy*  Lab Work: BMET-TODAY You will need to go to any [facility] for these labs. There is a [facility] in our building on the 1st floor or you can call [phone] or visit [email or web address] to find a lab near you. - You do NOT need an appointment for this. You do NOT need to be fasting. We NO LONGER have a lab located in our office.  If you have labs (blood work) drawn today and your tests are completely normal, you will receive your results only by: MyChart Message (if you have MyChart) OR A paper copy in the mail If you have any lab test that is abnormal or we need to change your treatment, we will call you to review the results.  Follow-Up: At [facility], you and your health needs are our priority.  As part of our continuing mission to provide you with exceptional heart care, we have created designated Provider Care Teams.  These Care Teams include your primary Cardiologist (physician) and Advanced Practice  Providers (APPs -  Physician Assistants and Nurse Practitioners) who all work together to provide you  with the care you need, when you need it.  We recommend signing up for the patient portal called "MyChart".  Sign up information is provided on this After Visit Summary.  MyChart is used to connect with patients for Virtual Visits (Telemedicine).  Patients are able to view lab/test results, encounter notes, upcoming appointments, etc.  Non-urgent messages can be sent to your provider as well.   To learn more about what you can do with MyChart, go to [email or web address].    Your next appointment:   As scheduled   Signed, Cline Crock, PA-C  01/16/2024 4:44 PM    [facility]

## 2024-01-16 ENCOUNTER — Ambulatory Visit (INDEPENDENT_AMBULATORY_CARE_PROVIDER_SITE_OTHER): Payer: Medicare Other | Admitting: Physician Assistant

## 2024-01-16 ENCOUNTER — Ambulatory Visit (HOSPITAL_COMMUNITY): Payer: Medicare Other | Attending: Cardiovascular Disease

## 2024-01-16 VITALS — BP 120/70 | HR 66 | Ht 63.0 in | Wt 171.4 lb

## 2024-01-16 DIAGNOSIS — I480 Paroxysmal atrial fibrillation: Secondary | ICD-10-CM | POA: Insufficient documentation

## 2024-01-16 DIAGNOSIS — J449 Chronic obstructive pulmonary disease, unspecified: Secondary | ICD-10-CM | POA: Insufficient documentation

## 2024-01-16 DIAGNOSIS — Z952 Presence of prosthetic heart valve: Secondary | ICD-10-CM | POA: Insufficient documentation

## 2024-01-16 DIAGNOSIS — D649 Anemia, unspecified: Secondary | ICD-10-CM

## 2024-01-16 DIAGNOSIS — I7776 Dissection of artery of upper extremity: Secondary | ICD-10-CM | POA: Diagnosis present

## 2024-01-16 DIAGNOSIS — D75839 Thrombocytosis, unspecified: Secondary | ICD-10-CM | POA: Insufficient documentation

## 2024-01-16 DIAGNOSIS — E876 Hypokalemia: Secondary | ICD-10-CM

## 2024-01-16 DIAGNOSIS — I5032 Chronic diastolic (congestive) heart failure: Secondary | ICD-10-CM | POA: Diagnosis not present

## 2024-01-16 DIAGNOSIS — I10 Essential (primary) hypertension: Secondary | ICD-10-CM | POA: Insufficient documentation

## 2024-01-16 LAB — ECHOCARDIOGRAM COMPLETE
AR max vel: 1.69 cm2
AV Area VTI: 1.74 cm2
AV Area mean vel: 1.75 cm2
AV Mean grad: 14 mm[Hg]
AV Peak grad: 28.9 mm[Hg]
Ao pk vel: 2.69 m/s
Area-P 1/2: 2.82 cm2
P 1/2 time: 463 ms
S' Lateral: 2.2 cm

## 2024-01-16 MED ORDER — AMIODARONE HCL 200 MG PO TABS: 200.0000 mg | ORAL_TABLET | Freq: Every day | ORAL | Status: AC

## 2024-01-16 NOTE — Patient Instructions (Signed)
Medication Instructions:  Your physician recommends that you continue on your current medications as directed. Please refer to the Current Medication list given to you today.  *If you need a refill on your cardiac medications before your next appointment, please call your pharmacy*  Lab Work: BMET-TODAY You will need to go to any [facility] for these labs. There is a [facility] in our building on the 1st floor or you can call [phone] or visit [email or web address] to find a lab near you. - You do NOT need an appointment for this. You do NOT need to be fasting. We NO LONGER have a lab located in our office.  If you have labs (blood work) drawn today and your tests are completely normal, you will receive your results only by: MyChart Message (if you have MyChart) OR A paper copy in the mail If you have any lab test that is abnormal or we need to change your treatment, we will call you to review the results.  Follow-Up: At [facility], you and your health needs are our priority.  As part of our continuing mission to provide you with exceptional heart care, we have created designated Provider Care Teams.  These Care Teams include your primary Cardiologist (physician) and Advanced Practice Providers (APPs -  Physician Assistants and Nurse Practitioners) who all work together to provide you with the care you need, when you need it.  We recommend signing up for the patient portal called "MyChart".  Sign up information is provided on this After Visit Summary.  MyChart is used to connect with patients for Virtual Visits (Telemedicine).  Patients are able to view lab/test results, encounter notes, upcoming appointments, etc.  Non-urgent messages can be sent to your provider as well.   To learn more about what you can do with MyChart, go to [email or web address].    Your next appointment:   As scheduled

## 2024-01-17 LAB — BASIC METABOLIC PANEL
BUN/Creatinine Ratio: 8 — ABNORMAL LOW (ref 12–28)
BUN: 9 mg/dL (ref 8–27)
CO2: 21 mmol/L (ref 20–29)
Calcium: 10 mg/dL (ref 8.7–10.3)
Chloride: 94 mmol/L — ABNORMAL LOW (ref 96–106)
Creatinine, Ser: 1.13 mg/dL — ABNORMAL HIGH (ref 0.57–1.00)
Glucose: 98 mg/dL (ref 70–99)
Potassium: 4.1 mmol/L (ref 3.5–5.2)
Sodium: 134 mmol/L (ref 134–144)
eGFR: 49 mL/min/{1.73_m2} — ABNORMAL LOW (ref >59)

## 2024-03-27 ENCOUNTER — Other Ambulatory Visit: Payer: Self-pay

## 2024-03-27 ENCOUNTER — Emergency Department
Admission: EM | Admit: 2024-03-27 | Discharge: 2024-03-27 | Disposition: A | Discharge status: Home / Self Care | Attending: Emergency Medicine | Admitting: Emergency Medicine

## 2024-03-27 ENCOUNTER — Emergency Department

## 2024-03-27 DIAGNOSIS — I5032 Chronic diastolic (congestive) heart failure: Secondary | ICD-10-CM | POA: Diagnosis present

## 2024-03-27 DIAGNOSIS — E039 Hypothyroidism, unspecified: Secondary | ICD-10-CM | POA: Diagnosis present

## 2024-03-27 DIAGNOSIS — E871 Hypo-osmolality and hyponatremia: Secondary | ICD-10-CM | POA: Diagnosis not present

## 2024-03-27 DIAGNOSIS — J441 Chronic obstructive pulmonary disease with (acute) exacerbation: Secondary | ICD-10-CM | POA: Diagnosis not present

## 2024-03-27 DIAGNOSIS — I350 Nonrheumatic aortic (valve) stenosis: Secondary | ICD-10-CM

## 2024-03-27 DIAGNOSIS — F419 Anxiety disorder, unspecified: Secondary | ICD-10-CM | POA: Diagnosis present

## 2024-03-27 DIAGNOSIS — Z7901 Long term (current) use of anticoagulants: Secondary | ICD-10-CM | POA: Insufficient documentation

## 2024-03-27 DIAGNOSIS — J449 Chronic obstructive pulmonary disease, unspecified: Secondary | ICD-10-CM | POA: Diagnosis present

## 2024-03-27 DIAGNOSIS — Z9981 Dependence on supplemental oxygen: Secondary | ICD-10-CM | POA: Diagnosis not present

## 2024-03-27 DIAGNOSIS — R0602 Shortness of breath: Secondary | ICD-10-CM

## 2024-03-27 DIAGNOSIS — R531 Weakness: Secondary | ICD-10-CM

## 2024-03-27 DIAGNOSIS — I4891 Unspecified atrial fibrillation: Secondary | ICD-10-CM | POA: Diagnosis present

## 2024-03-27 LAB — COMPREHENSIVE METABOLIC PANEL WITH GFR
ALT: 25 U/L (ref 0–44)
AST: 35 U/L (ref 15–41)
Albumin: 4 g/dL (ref 3.5–5.0)
Alkaline Phosphatase: 89 U/L (ref 38–126)
Anion gap: 12 (ref 5–15)
BUN: 13 mg/dL (ref 8–23)
CO2: 23 mmol/L (ref 22–32)
Calcium: 9.3 mg/dL (ref 8.9–10.3)
Chloride: 90 mmol/L — ABNORMAL LOW (ref 98–111)
Creatinine, Ser: 0.93 mg/dL (ref 0.44–1.00)
GFR, Estimated: >60 mL/min (ref >60)
Glucose, Bld: 108 mg/dL — ABNORMAL HIGH (ref 70–99)
Potassium: 3.9 mmol/L (ref 3.5–5.1)
Sodium: 125 mmol/L — ABNORMAL LOW (ref 135–145)
Total Bilirubin: 0.9 mg/dL (ref 0.0–1.2)
Total Protein: 7.5 g/dL (ref 6.5–8.1)

## 2024-03-27 LAB — CBC WITH DIFFERENTIAL/PLATELET
Abs Immature Granulocytes: 0.02 10*3/uL (ref 0.00–0.07)
Basophils Absolute: 0 10*3/uL (ref 0.0–0.1)
Basophils Relative: 1 %
Eosinophils Absolute: 0 10*3/uL (ref 0.0–0.5)
Eosinophils Relative: 0 %
HCT: 29.1 % — ABNORMAL LOW (ref 36.0–46.0)
Hemoglobin: 9.5 g/dL — ABNORMAL LOW (ref 12.0–15.0)
Immature Granulocytes: 0 %
Lymphocytes Relative: 13 %
Lymphs Abs: 0.7 10*3/uL (ref 0.7–4.0)
MCH: 26.2 pg (ref 26.0–34.0)
MCHC: 32.6 g/dL (ref 30.0–36.0)
MCV: 80.4 fL (ref 80.0–100.0)
Monocytes Absolute: 0.6 10*3/uL (ref 0.1–1.0)
Monocytes Relative: 11 %
Neutro Abs: 4 10*3/uL (ref 1.7–7.7)
Neutrophils Relative %: 75 %
Platelets: 445 10*3/uL — ABNORMAL HIGH (ref 150–400)
RBC: 3.62 MIL/uL — ABNORMAL LOW (ref 3.87–5.11)
RDW: 17.4 % — ABNORMAL HIGH (ref 11.5–15.5)
WBC: 5.4 10*3/uL (ref 4.0–10.5)
nRBC: 0.4 % — ABNORMAL HIGH (ref 0.0–0.2)

## 2024-03-27 LAB — URINALYSIS, ROUTINE W REFLEX MICROSCOPIC
Bacteria, UA: NONE SEEN
Bilirubin Urine: NEGATIVE
Glucose, UA: NEGATIVE mg/dL
Ketones, ur: NEGATIVE mg/dL
Leukocytes,Ua: NEGATIVE
Nitrite: NEGATIVE
Protein, ur: NEGATIVE mg/dL
Specific Gravity, Urine: 1.004 — ABNORMAL LOW (ref 1.005–1.030)
Squamous Epithelial / HPF: 0 /HPF (ref 0–5)
pH: 7 (ref 5.0–8.0)

## 2024-03-27 LAB — OSMOLALITY, URINE: Osmolality, Ur: 241 mosm/kg — ABNORMAL LOW (ref 300–900)

## 2024-03-27 LAB — CREATININE, URINE, RANDOM: Creatinine, Urine: 24 mg/dL

## 2024-03-27 LAB — MAGNESIUM: Magnesium: 2 mg/dL (ref 1.7–2.4)

## 2024-03-27 LAB — TROPONIN I (HIGH SENSITIVITY)
Troponin I (High Sensitivity): 12 ng/L (ref <18)
Troponin I (High Sensitivity): 13 ng/L (ref <18)

## 2024-03-27 LAB — SODIUM, URINE, RANDOM: Sodium, Ur: 62 mmol/L

## 2024-03-27 LAB — BRAIN NATRIURETIC PEPTIDE: B Natriuretic Peptide: 99.7 pg/mL (ref 0.0–100.0)

## 2024-03-27 LAB — LIPASE, BLOOD: Lipase: 37 U/L (ref 11–51)

## 2024-03-27 MED ORDER — IPRATROPIUM-ALBUTEROL 0.5-2.5 (3) MG/3ML IN SOLN
6.0000 mL | Freq: Once | RESPIRATORY_TRACT | Status: AC
  Administered 2024-03-27: 6 mL via RESPIRATORY_TRACT
  Filled 2024-03-27: qty 6

## 2024-03-27 MED ORDER — ACETAMINOPHEN 500 MG PO TABS
1000.0000 mg | ORAL_TABLET | Freq: Once | ORAL | Status: AC
  Administered 2024-03-27: 1000 mg via ORAL
  Filled 2024-03-27: qty 2

## 2024-03-27 MED ORDER — ROPINIROLE HCL 0.25 MG PO TABS
0.2500 mg | ORAL_TABLET | Freq: Once | ORAL | Status: AC
  Administered 2024-03-27: 0.25 mg via ORAL
  Filled 2024-03-27: qty 1

## 2024-03-27 MED ORDER — METHYLPREDNISOLONE SODIUM SUCC 125 MG IJ SOLR
125.0000 mg | Freq: Once | INTRAMUSCULAR | Status: AC
  Administered 2024-03-27: 125 mg via INTRAVENOUS
  Filled 2024-03-27: qty 2

## 2024-03-27 MED ORDER — PREDNISONE 20 MG PO TABS: 40.0000 mg | ORAL_TABLET | Freq: Every day | ORAL | 0 refills | Status: AC

## 2024-03-27 MED ORDER — IPRATROPIUM-ALBUTEROL 0.5-2.5 (3) MG/3ML IN SOLN
3.0000 mL | Freq: Once | RESPIRATORY_TRACT | Status: AC
Start: 2024-03-27 — End: 2024-03-27
  Administered 2024-03-27: 3 mL via RESPIRATORY_TRACT
  Filled 2024-03-27: qty 3

## 2024-03-27 MED ORDER — LORAZEPAM 1 MG PO TABS
1.0000 mg | ORAL_TABLET | Freq: Once | ORAL | Status: AC
  Administered 2024-03-27: 1 mg via ORAL
  Filled 2024-03-27: qty 1

## 2024-03-27 NOTE — Assessment & Plan Note (Signed)
 TSH checked 3 months prior within normal limits at the time.  No indication for rechecking at this time.

## 2024-03-27 NOTE — Assessment & Plan Note (Signed)
 Patient has a history of HFpEF with last EF of 60-65% with grade 2 diastolic dysfunction.  BNP is within normal limits and she appears euvolemic on examination despite polydipsia.  - Continue home regimen

## 2024-03-27 NOTE — Discharge Instructions (Addendum)
 We offered admission to the hospitalist and the hospitalist came and saw you but you declined admission you should return to the ER if you develop worsening symptoms or any other concerns  - Restrict free water intake to less than 2 L/day - Recheck BMP in 1 week at PCP office

## 2024-03-27 NOTE — ED Provider Notes (Addendum)
 4:06 PM Assumed care for off going team.   Blood pressure (!) 180/78, pulse 74, temperature 98.1 F (36.7 C), temperature source Oral, resp. rate 20, height 5\' 3"  (1.6 m), SpO2 100%.  See their HPI for full report but in brief penidng re-eval   Patient's breathing is much more comfortable.  Patient with new hyponatremia.  Does report feeling more weak.  Discussed admission versus going home but given weakness will admit patient to the hospital. Pt reports drinking tons of free water.    6:45 PM patient was seen by the hospitalist and was stating that she preferred to go home.  See her note for more information.  She did recommend to restrict free water intake given copious amounts of water that patient is taking I am to prescribe some steroids for her COPD.  Did discuss with patient and she was agreeable to this plan and stating to me she is ready to go home.            Lubertha Rush, MD 03/27/24 Alphonso Aschoff

## 2024-03-27 NOTE — Assessment & Plan Note (Signed)
 Per chart review, patient has a history of hyponatremia with baseline sodium ranging between 132 and 134 since 2023.  Initially attribute it to hydrochlorothiazide  which has now since been discontinued.  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  Patient endorses polydipsia but we discussed the importance and restricting her intake to less than 2 L daily.  - Will add on urine osmolality, creatinine and sodium to previous urine collected - Restrict free water intake to less than 2 L/day - Recheck BMP in 1 week at PCP office

## 2024-03-27 NOTE — ED Triage Notes (Signed)
 Pt to ED via [facility] from home for c/o weakness and shortness of breath x1 week, nausea x2 weeks. Hx CHF, COPD, Afib. Pt had valve replacement about 2 months ago.

## 2024-03-27 NOTE — Assessment & Plan Note (Signed)
 History of severe aortic stenosis s/p TAVR in January 2025 with postoperative echocardiogram demonstrating well-seated valve.  - Continue outpatient follow-up with cardiology

## 2024-03-27 NOTE — Assessment & Plan Note (Signed)
 EKG with sinus rhythm at this time.  Patient is on Eliquis  for anticoagulation.

## 2024-03-27 NOTE — ED Provider Notes (Signed)
 [facility] Provider Note    Event Date/Time   First MD Initiated Contact with Patient 03/27/24 1403     (approximate)   History   Weakness and Shortness of Breath   HPI  Linda Brady is a 83 y.o. female who presents to the ED for evaluation of Weakness and Shortness of Breath    I review a pulmonary clinic visit from last month.  History of COPD on chronic 2 L oxygen.  Recent TAVR in January.  Anticoagulated on Eliquis  due to A-fib.  Patient presents to the ED for evaluation of grossly worsening 2 weeks of shortness of breath, dizziness and presyncope and orthopnea.  Denies any syncopal episodes but reports that she is frequently going to pass out and blames her anxiety for this.  No fevers.  She reports "a few years" of abdominal pain  She is quite anxious on arrival and requesting medication to help with her breathing and her anxiety.  We talked about BiPAP during my initial evaluation but she would like to try breathing treatments first.  Physical Exam   Triage Vital Signs: ED Triage Vitals  Encounter Vitals Group     BP 03/27/24 1407 (!) 180/78     Systolic BP Percentile --      Diastolic BP Percentile --      Pulse Rate 03/27/24 1407 74     Resp 03/27/24 1407 20     Temp 03/27/24 1407 98.1 F (36.7 C)     Temp Source 03/27/24 1407 Oral     SpO2 03/27/24 1407 100 %     Weight --      Height --      Head Circumference --      Peak Flow --      Pain Score 03/27/24 1411 4     Pain Loc --      Pain Education --      Exclude from Growth Chart --     Most recent vital signs: Vitals:   03/27/24 1407  BP: (!) 180/78  Pulse: 74  Resp: 20  Temp: 98.1 F (36.7 C)  SpO2: 100%    General: Awake, no distress.  CV:  Good peripheral perfusion.  Resp:  Tachypneic to the mid 20s.  Poor airflow and wheezing throughout. Abd:  No distention.  MSK:  No deformity noted.  Trace pitting edema bilaterally Neuro:  No focal deficits  appreciated. Other:     ED Results / Procedures / Treatments   Labs (all labs ordered are listed, but only abnormal results are displayed) Labs Reviewed  COMPREHENSIVE METABOLIC PANEL WITH GFR - Abnormal; Notable for the following components:      Result Value   Sodium 125 (*)    Chloride 90 (*)    Glucose, Bld 108 (*)    All other components within normal limits  CBC WITH DIFFERENTIAL/PLATELET - Abnormal; Notable for the following components:   RBC 3.62 (*)    Hemoglobin 9.5 (*)    HCT 29.1 (*)    RDW 17.4 (*)    Platelets 445 (*)    nRBC 0.4 (*)    All other components within normal limits  LIPASE, BLOOD  MAGNESIUM   BRAIN NATRIURETIC PEPTIDE  URINALYSIS, ROUTINE W REFLEX MICROSCOPIC  TROPONIN I (HIGH SENSITIVITY)    EKG Sinus rhythm with a rate of 74 bpm.  Normal axis and intervals.  No clear signs of acute ischemia.  RADIOLOGY 1 view CXR interpreted by me  was mild pulmonary vascular congestion without discrete lobar filtration  Official radiology report(s): No results found.  PROCEDURES and INTERVENTIONS:  .1-3 Lead EKG Interpretation  Performed by: Arline Bennett, MD Authorized by: Arline Bennett, MD     Interpretation: normal     ECG rate:  76   ECG rate assessment: normal     Rhythm: sinus rhythm     Ectopy: none     Conduction: normal     Medications  ipratropium-albuterol  (DUONEB) 0.5-2.5 (3) MG/3ML nebulizer solution 3 mL (has no administration in time range)  methylPREDNISolone  sodium succinate (SOLU-MEDROL ) 125 mg/2 mL injection 125 mg (125 mg Intravenous Given 03/27/24 1422)  ipratropium-albuterol  (DUONEB) 0.5-2.5 (3) MG/3ML nebulizer solution 6 mL (6 mLs Nebulization Given 03/27/24 1422)  LORazepam  (ATIVAN ) tablet 1 mg (1 mg Oral Given 03/27/24 1421)     IMPRESSION / MDM / ASSESSMENT AND PLAN / ED COURSE  I reviewed the triage vital signs and the nursing notes.  Differential diagnosis includes, but is not limited to, ACS, PTX, PNA, muscle  strain/spasm, PE, dissection, anxiety, pleural effusion  {Patient presents with symptoms of an acute illness or injury that is potentially life-threatening.  Patient presents with shortness of breath and dizziness possibly due to COPD and anxiety.  Mild wheezing on exam improving with breathing treatments and starting her on steroids.  Hyponatremia is noted.  Chronic normocytic anemia.  Low troponin and nonischemic EKG.  She was signed out to oncoming physician to follow-up on repeat troponin and reassess the patient.  Clinical Course as of 03/27/24 1514  Sat Mar 27, 2024  1427 reassessed [DS]  1456 Reassessed.  Reports feeling better and is appreciative. [DS]    Clinical Course User Index [DS] Arline Bennett, MD     FINAL CLINICAL IMPRESSION(S) / ED DIAGNOSES   Final diagnoses:  COPD exacerbation (HCC)  Shortness of breath     Rx / DC Orders   ED Discharge Orders     None        Note:  This document was prepared using Dragon voice recognition software and may include unintentional dictation errors.   Arline Bennett, MD 03/27/24 [phone]

## 2024-03-27 NOTE — Assessment & Plan Note (Signed)
 Patient endorsing generalized weakness that has been developing over the last several weeks with no acute worsening.  She is still able to perform all of her ADLs independently.  No indication for SNF but may ultimately benefit from outpatient PT.

## 2024-03-27 NOTE — Consult Note (Signed)
 Initial Consultation Note   Patient: Linda Brady DOB: Aug 29, 1941 PCP: Lyle San, MD DOA: 03/27/2024 DOS: the patient was seen and examined on 03/27/2024 Primary service: Lubertha Rush, MD  Referring physician: Dr. Peggi Bowels Reason for consult: SOB, weakness  Assessment/Plan:  Hyponatremia Per chart review, patient has a history of hyponatremia with baseline sodium ranging between 132 and 134 since 2023.  Initially attribute it to hydrochlorothiazide  which has now since been discontinued.  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  Patient endorses polydipsia but we discussed the importance and restricting her intake to less than 2 L daily.  - Will add on urine osmolality, creatinine and sodium to previous urine collected - Restrict free water intake to less than 2 L/day - Recheck BMP in 1 week at PCP office  Anxiety Patient endorses severe generalized anxiety that has been ongoing for a long time.  She was previously seeing psychiatry for this but has not lately.  In addition, it looks like her Prozac  has not been filled since December.  Strongly encouraged her to reestablish with psychiatry ASAP.  No indication at this time for inpatient psychiatric evaluation.  COPD (chronic obstructive pulmonary disease) (HCC) Patient endorses increasing shortness of breath over the last few days but denies any increased cough.  Mild wheezing noted on examination.  History of severe COPD on chronic oxygen.  - S/p Solu-Medrol  125 mg once - Would recommend prednisone  40 mg to start tomorrow tomorrow to complete a 5-day course - Recommend she utilize her Xopenex  and Combivent  every 6 hours over the next couple days  Chronic heart failure with preserved ejection fraction (HFpEF) (HCC) Patient has a history of HFpEF with last EF of 60-65% with grade 2 diastolic dysfunction.  BNP is within normal limits  and she appears euvolemic on examination despite polydipsia.  - Continue home regimen  Afib (HCC) EKG with sinus rhythm at this time.  Patient is on Eliquis  for anticoagulation.  Hypothyroidism TSH checked 3 months prior within normal limits at the time.  No indication for rechecking at this time.  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  - Continue outpatient follow-up with cardiology  Generalized weakness Patient endorsing generalized weakness that has been developing over the last several weeks with no acute worsening.  She is still able to perform all of her ADLs independently.  No indication for SNF but may ultimately benefit from outpatient PT.   TRH will sign off at present, please call us  again when needed.  -------------------------------------------------------------------------------------  HPI: Linda Brady is a 83 y.o. female with past medical history of severe COPD with chronic hypoxic respiratory failure on 3 L, aortic stenosis s/p TAVR (January 2025), hypertension, HFpEF, atrial fibrillation on Eliquis , generalized anxiety disorder, insomnia, restless leg syndrome, who presents to the ED due to shortness of breath.  Linda Brady states that for the last 1 week, she has been experiencing increasing shortness of breath but denies any cough.  During the time, she has also had increasing nausea without vomiting, generalized abdominal cramps, and increasing anxiety.  She feels that her anxiety is worsening her symptoms, she is not sure if this feels like her typical COPD flareup.  She denies any chest pain, palpitations.  Linda Brady states that she recently ran out of her anxiety, insomnia, and restless leg medications over the last  several days.  She endorses polydipsia with chronic dry mouth, noting that lately, she has been drinking a lot more water and eating  ice all day.  She endorses generalized weakness that has been gradually developing over the last several weeks with no acute worsening.  She is still able to perform her ADLs including bathing, cooking, dressing.  Her daughter helps her with her medications.  The only activity she is no longer able to do is put bed sheets on and mop.  ED course: On arrival to the ED, patient was hypertensive at 180/78 with heart rate of 73.  She was saturating at 100% on home 3 L.  She was afebrile at 98.1.  Initial workup notable for hemoglobin of 9.5, platelets 445, sodium 125, creatinine 0.93 and GFR above 60.  Urinalysis small hematuria, and decreased specific gravity.  Chest x-ray with chronic hyperinflation and scarring, no acute changes.  Patient given Solu-Medrol , Ativan , DuoNeb, Tylenol .  TRH contacted for admission.  Review of Systems: As mentioned in the history of present illness. All other systems reviewed and are negative.  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 Lorie Rook and Dr. Honey Lusty   Thyroid  disease    Past Surgical History:  Procedure Laterality Date   BREAST CYST ASPIRATION Left 10 plus yrs   benign-twice   CATARACT EXTRACTION W/PHACO Left 03/20/2023   Procedure: CATARACT EXTRACTION PHACO AND INTRAOCULAR LENS PLACEMENT (IOC) LEFT VISION BLUE;  Surgeon: Trudi Fus, MD;  Location: [facility];  Service: Ophthalmology;  Laterality: Left;  21.02   01:38.2   INTRAOPERATIVE TRANSTHORACIC ECHOCARDIOGRAM N/A 12/16/2023   Procedure: INTRAOPERATIVE TRANSTHORACIC ECHOCARDIOGRAM;  Surgeon: Kyra Phy, MD;  Location: [facility] INVASIVE CV [facility];  Service: Cardiovascular;   Laterality: N/A;   LEFT HEART CATH AND CORONARY ANGIOGRAPHY N/A 09/11/2020   Procedure: LEFT HEART CATH AND CORONARY ANGIOGRAPHY;  Surgeon: Percival Brace, MD;  Location: [facility] INVASIVE CV LAB;  Service: Cardiovascular;  Laterality: N/A;   PERIPHERAL VASCULAR INTERVENTION Right 10/24/2023   Procedure: PERIPHERAL VASCULAR INTERVENTION;  Surgeon: Carlene Che, MD;  Location: [facility] INVASIVE CV LAB;  Service: Cardiovascular;  Laterality: Right;  right axillary stent   RIGHT/LEFT HEART CATH AND CORONARY ANGIOGRAPHY N/A 10/23/2023   Procedure: RIGHT/LEFT HEART CATH AND CORONARY ANGIOGRAPHY;  Surgeon: Kyra Phy, MD;  Location: [facility] INVASIVE CV LAB;  Service: Cardiovascular;  Laterality: N/A;   UPPER EXTREMITY ANGIOGRAPHY Right 10/24/2023   Procedure: Upper Extremity Angiography;  Surgeon: Carlene Che, MD;  Location: [facility] INVASIVE CV LAB;  Service: Cardiovascular;  Laterality: Right;   Social History:  reports that she has quit smoking. Her smoking use included cigarettes. She has a 6 pack-year smoking history. She has never used smokeless tobacco. She reports that she does not currently use alcohol. She reports that she does not currently use drugs.  Allergies  Allergen Reactions   Naproxen Sodium Anaphylaxis    Family History  Problem Relation Age of Onset   Breast cancer Neg Hx     Prior to Admission medications   Medication Sig Start Date End Date Taking? Authorizing Provider  montelukast (SINGULAIR) 10 MG tablet Take 1  tablet by mouth at bedtime. 02/03/24 02/02/25 Yes [provider]  ALPRAZolam  (XANAX ) 0.25 MG tablet Take 0.25 mg by mouth at bedtime as needed for sleep.    [provider]  amiodarone  (PACERONE ) 200 MG tablet Take 1 tablet (200 mg total) by mouth daily. 01/16/24   Ardia Kraft, PA-C  amoxicillin  (AMOXIL ) 500 MG tablet Take 4 tablets (2,000 mg total) by mouth as directed. 1 hour prior to dental work including cleanings 12/26/23   Ardia Kraft, PA-C  apixaban  (ELIQUIS ) 5 MG TABS tablet Take 5 mg by mouth 2 (two) times daily.    [provider]  atorvastatin  (LIPITOR) 40 MG tablet Take 1 tablet (40 mg total) by mouth daily. 09/20/23   Alexander, Natalie, DO  cholecalciferol  (VITAMIN D3) 25 MCG (1000 UNIT) tablet Take 1,000 Units by mouth daily.    [provider]  FLUoxetine  (PROZAC ) 10 MG capsule Take 2 capsules (20 mg total) by mouth daily as needed (anxiety). 11/17/23 01/16/24  Jacquie Maudlin, MD  furosemide  (LASIX ) 20 MG tablet Take 1 tablet (20 mg total) by mouth daily. 12/02/23 03/01/24  Ardia Kraft, PA-C  Ipratropium-Albuterol  (COMBIVENT  RESPIMAT) 20-100 MCG/ACT AERS respimat Inhale 1 puff into the lungs every 6 (six) hours as needed for wheezing.    [provider]  isosorbide  mononitrate (IMDUR ) 30 MG 24 hr tablet Take 1 tablet (30 mg total) by mouth daily. 10/14/22   Furth, Cadence H, PA-C  levalbuterol  (XOPENEX ) 1.25 MG/3ML nebulizer solution Take 1.25 mg by nebulization every 6 (six) hours as needed. 12/08/23 12/07/24  [provider]  levothyroxine  (SYNTHROID ) 75 MCG tablet Take 75 mcg by mouth daily before breakfast. 05/02/20   [provider]  mometasone -formoterol  (DULERA ) 200-5 MCG/ACT AERO Inhale 2 puffs into the lungs 2 (two) times daily. 12/17/23   Ardia Kraft, PA-C  pantoprazole  (PROTONIX ) 40 MG tablet Take 1 tablet (40 mg total) by mouth 2 (two) times daily. 11/17/23 01/16/24  Jacquie Maudlin, MD  potassium chloride  SA (KLOR-CON  M) 20 MEQ tablet Take 2 tablets (40 mEq total) by mouth daily. 12/26/23   Ardia Kraft, PA-C  rOPINIRole  (REQUIP ) 0.25 MG tablet Take 1 tablet (0.25 mg total) by mouth at bedtime. 10/28/23 12/16/23  Magdalene School, MD  spironolactone  (ALDACTONE ) 25 MG tablet Take 1 tablet (25 mg total) by mouth daily. 12/26/23 03/25/24  Ardia Kraft, PA-C  sucralfate  (CARAFATE ) 1 g tablet Take 1 tablet (1 g total) by mouth 4 (four)  times daily. 11/17/23   Jacquie Maudlin, MD  vitamin B-12 (CYANOCOBALAMIN ) 1000 MCG tablet Take 1,000 mcg by mouth daily.    [provider]    Physical Exam: Vitals:   03/27/24 1407 03/27/24 1413  BP: (!) 180/78   Pulse: 74   Resp: 20   Temp: 98.1 F (36.7 C)   TempSrc: Oral   SpO2: 100%   Height:  5\' 3"  (1.6 m)   Physical Exam Vitals and nursing note reviewed.  Constitutional:      General: She is not in acute distress.    Appearance: She is normal weight. She is not toxic-appearing.  HENT:     Head: Normocephalic and atraumatic.  Eyes:     Extraocular Movements: Extraocular movements intact.     Pupils: Pupils are equal, round, and reactive to light.  Cardiovascular:     Rate and Rhythm: Normal rate and regular rhythm.     Heart sounds: Murmur (4/6 systolic murmur) heard.  Pulmonary:  Effort: No tachypnea or respiratory distress.     Breath sounds: Wheezing (Mild intermittent expiratory wheezing) present. No decreased breath sounds, rhonchi or rales.  Abdominal:     Palpations: Abdomen is soft.     Tenderness: There is no abdominal tenderness.  Musculoskeletal:     Right lower leg: No edema.     Left lower leg: No edema.  Skin:    General: Skin is warm and dry.  Neurological:     General: No focal deficit present.     Mental Status: She is alert and oriented to person, place, and time.  Psychiatric:        Mood and Affect: Mood is anxious.        Speech: Speech normal.        Behavior: Behavior is cooperative.    Data Reviewed:  CBC with WBC of 5.4, hemoglobin of 9.5, platelets of 445 CMP with sodium of 125, potassium 3.9, bicarb 23, glucose 108, BUN 13, creatinine 0.93, AST 35, ALT 25, GFR above 60 BNP 99 Troponin 12  Urinalysis with small hematuria, and decreased specific gravity  EKG personally reviewed.  Sinus rhythm with first-degree AV block.  Notched P waves consistent with pulmonary pattern.  No acute ischemic changes.  There are no  new results to review at this time.   Family Communication: No family at bedside Primary team communication: Discussed with primary team via telephone  Thank you very much for involving us  in the care of your patient.  Author: Avi Body, MD 03/27/2024 5:20 PM  For on call review [email or web address].

## 2024-03-27 NOTE — Assessment & Plan Note (Signed)
 Patient endorses increasing shortness of breath over the last few days but denies any increased cough.  Mild wheezing noted on examination.  History of severe COPD on chronic oxygen.  - S/p Solu-Medrol  125 mg once - Would recommend prednisone  40 mg to start tomorrow tomorrow to complete a 5-day course - Recommend she utilize her Xopenex  and Combivent  every 6 hours over the next couple days

## 2024-03-27 NOTE — Assessment & Plan Note (Signed)
 Patient endorses severe generalized anxiety that has been ongoing for a long time.  She was previously seeing psychiatry for this but has not lately.  In addition, it looks like her Prozac  has not been filled since December.  Strongly encouraged her to reestablish with psychiatry ASAP.  No indication at this time for inpatient psychiatric evaluation.

## 2024-03-31 ENCOUNTER — Encounter: Payer: Self-pay | Admitting: Cardiology

## 2024-03-31 ENCOUNTER — Ambulatory Visit: Attending: Cardiology | Admitting: Cardiology

## 2024-03-31 VITALS — BP 155/72 | HR 75 | Resp 16 | Ht 63.0 in | Wt 177.0 lb

## 2024-03-31 DIAGNOSIS — I739 Peripheral vascular disease, unspecified: Secondary | ICD-10-CM | POA: Diagnosis present

## 2024-03-31 DIAGNOSIS — R600 Localized edema: Secondary | ICD-10-CM | POA: Insufficient documentation

## 2024-03-31 DIAGNOSIS — I2510 Atherosclerotic heart disease of native coronary artery without angina pectoris: Secondary | ICD-10-CM | POA: Insufficient documentation

## 2024-03-31 DIAGNOSIS — I480 Paroxysmal atrial fibrillation: Secondary | ICD-10-CM | POA: Diagnosis present

## 2024-03-31 DIAGNOSIS — Z952 Presence of prosthetic heart valve: Secondary | ICD-10-CM | POA: Insufficient documentation

## 2024-03-31 MED ORDER — FUROSEMIDE 40 MG PO TABS: 40.0000 mg | ORAL_TABLET | Freq: Every day | ORAL | 3 refills | Status: DC

## 2024-03-31 NOTE — Progress Notes (Signed)
 Cardiology Office Note:    Date:  03/31/2024   ID:  Linda Brady, DOB 26-Feb-1941, MRN [id number]  PCP:  Lyle San, MD   [facility] Providers Cardiologist:  Constancia Delton, MD     Referring MD: Lyle San, MD   Chief Complaint  Patient presents with   Coronary artery disease involving native coronary artery of   Follow-up    History of Present Illness:    Linda Brady is a 83 y.o. 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  States having right arm pain.  Had a right brachial pseudoaneurysm with stent placement 10/2023.  Has noticed some numbness and pain over the past week or so.  Has leg edema, endorse having varicose veins, uses compression stockings.  Also states feeling very anxious.  Prior notes Echo 01/2024 EF 60 to 65%, s/p TAVR 23 mm Edwards, mild AI, Echo 09/2020 normal systolic function, EF 55 to 60% Left heart cath 09/2020 mild proximal LAD disease 30%, moderate RCA disease 50%.  Past Medical History:  Diagnosis Date   A-fib [facility])    CHF (congestive heart failure) (HCC)    COPD (chronic obstructive pulmonary disease) (HCC)    GERD (gastroesophageal reflux disease)    Hypertension    Hypothyroidism    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   s/p TAVR with a 23 mm Edwards S3UR via TF approach by Dr. Lorie Rook and Dr. Honey Lusty   Thyroid  disease     Past Surgical History:  Procedure Laterality Date   BREAST CYST ASPIRATION Left 10 plus yrs   benign-twice   CATARACT EXTRACTION W/PHACO Left 03/20/2023   Procedure: CATARACT EXTRACTION PHACO AND INTRAOCULAR LENS PLACEMENT (IOC) LEFT VISION BLUE;  Surgeon: Trudi Fus, MD;  Location: [facility];   Service: Ophthalmology;  Laterality: Left;  21.02   01:38.2   INTRAOPERATIVE TRANSTHORACIC ECHOCARDIOGRAM N/A 12/16/2023   Procedure: INTRAOPERATIVE TRANSTHORACIC ECHOCARDIOGRAM;  Surgeon: Kyra Phy, MD;  Location: [facility] INV[facility] CV [facility];  Service: Cardiovascular;  Laterality: N/A;   LEFT HEART CATH AND CORONARY ANGIOGRAPHY N/A 09/11/2020   Procedure: LEFT HEART CATH AND CORONARY ANGIOGRAPHY;  Surgeon: Percival Brace, MD;  Location: [facility] INVASIVE CV LAB;  Service: Cardiovascular;  Laterality: N/A;   PERIPHERAL VASCULAR INTERVENTION Right 10/24/2023   Procedure: PERIPHERAL VASCULAR INTERVENTION;  Surgeon: Carlene Che, MD;  Location: [facility] INVASIVE CV LAB;  Service: Cardiovascular;  Laterality: Right;  right axillary stent   RIGHT/LEFT HEART CATH AND CORONARY ANGIOGRAPHY N/A 10/23/2023   Procedure: RIGHT/LEFT HEART CATH AND CORONARY ANGIOGRAPHY;  Surgeon: Kyra Phy, MD;  Location: [facility] INVASIVE CV LAB;  Service: Cardiovascular;  Laterality: N/A;   UPPER EXTREMITY ANGIOGRAPHY Right 10/24/2023   Procedure: Upper Extremity Angiography;  Surgeon: Carlene Che, MD;  Location: [facility] INVASIVE CV LAB;  Service: Cardiovascular;  Laterality: Right;    Current Medications: Current Meds  Medication Sig   ALPRAZolam  (XANAX ) 0.25 MG tablet Take 0.25 mg by mouth at bedtime as needed for sleep.   amiodarone  (PACERONE ) 200 MG tablet Take 1 tablet (200 mg total) by mouth daily.   apixaban  (ELIQUIS ) 5 MG TABS tablet Take 5 mg by mouth 2 (two) times daily.   atorvastatin  (LIPITOR) 40 MG tablet Take 1 tablet (40 mg total)  by mouth daily.   cholecalciferol  (VITAMIN D3) 25 MCG (1000 UNIT) tablet Take 1,000 Units by mouth daily.   FLUoxetine  (PROZAC ) 10 MG capsule Take 2 capsules (20 mg total) by mouth daily as needed (anxiety).   Ipratropium-Albuterol  (COMBIVENT  RESPIMAT) 20-100 MCG/ACT AERS respimat Inhale 1 puff into the lungs every 6 (six) hours as needed for wheezing.   isosorbide  mononitrate  (IMDUR ) 30 MG 24 hr tablet Take 1 tablet (30 mg total) by mouth daily.   levalbuterol  (XOPENEX ) 1.25 MG/3ML nebulizer solution Take 1.25 mg by nebulization every 6 (six) hours as needed.   levothyroxine  (SYNTHROID ) 75 MCG tablet Take 75 mcg by mouth daily before breakfast.   mometasone -formoterol  (DULERA ) 200-5 MCG/ACT AERO Inhale 2 puffs into the lungs 2 (two) times daily.   montelukast (SINGULAIR) 10 MG tablet Take 1 tablet by mouth at bedtime.   pantoprazole  (PROTONIX ) 40 MG tablet Take 1 tablet (40 mg total) by mouth 2 (two) times daily.   potassium chloride  SA (KLOR-CON  M) 20 MEQ tablet Take 2 tablets (40 mEq total) by mouth daily.   predniSONE  (DELTASONE ) 20 MG tablet Take 2 tablets (40 mg total) by mouth daily with breakfast for 5 days.   rOPINIRole  (REQUIP ) 0.25 MG tablet Take 1 tablet (0.25 mg total) by mouth at bedtime.   spironolactone  (ALDACTONE ) 25 MG tablet Take 1 tablet (25 mg total) by mouth daily.   sucralfate  (CARAFATE ) 1 g tablet Take 1 tablet (1 g total) by mouth 4 (four) times daily.   vitamin B-12 (CYANOCOBALAMIN ) 1000 MCG tablet Take 1,000 mcg by mouth daily.   [DISCONTINUED] furosemide  (LASIX ) 20 MG tablet Take 1 tablet (20 mg total) by mouth daily.     Allergies:   Naproxen sodium   Social History   Socioeconomic History   Marital status: Widowed    Spouse name: Not on file   Number of children: Not on file   Years of education: Not on file   Highest education level: Not on file  Occupational History   Not on file  Tobacco Use   Smoking status: Former    Current packs/day: 0.50    Average packs/day: 0.5 packs/day for 12.0 years (6.0 ttl pk-yrs)    Types: Cigarettes   Smokeless tobacco: Never   Tobacco comments:    7-8 cigarettes a day  Vaping Use   Vaping status: Never Used  Substance and Sexual Activity   Alcohol use: Not Currently   Drug use: Not Currently   Sexual activity: Not Currently  Other Topics Concern   Not on file  Social History  Narrative   Not on file   Social Drivers of Health   Financial Resource Strain: Low Risk  (02/03/2024)   Received from [facility]   Overall Financial Resource Strain (CARDIA)    Difficulty of Paying Living Expenses: Not hard at all  Food Insecurity: No Food Insecurity (02/03/2024)   Received from [facility]   Hunger Vital Sign    Worried About Running Out of Food in the Last Year: Never true    Ran Out of Food in the Last Year: Never true  Transportation Needs: No Transportation Needs (02/03/2024)   Received from [facility] - Transportation    In the past 12 months, has lack of transportation kept you from medical appointments or from getting medications?: No    Lack of Transportation (Non-Medical): No  Physical Activity: Not on file  Stress: Not on file  Social Connections: Not on file     Family History: The patient's family history is negative for Breast cancer.  ROS:   Please see the history of present illness.     All other systems reviewed and are negative.  EKGs/Labs/Other Studies Reviewed:    The following studies were reviewed today:  .  Recent Labs: 12/26/2023: NT-Pro BNP 323; TSH 4.030 03/27/2024: ALT 25; B Natriuretic Peptide 99.7; BUN 13; Creatinine, Ser 0.93; Hemoglobin 9.5; Magnesium  2.0; Platelets 445; Potassium 3.9; Sodium 125  Recent Lipid Panel    Component Value Date/Time   CHOL 158 09/10/2020 1452   TRIG 100 09/10/2020 1452   HDL 53 09/10/2020 1452   CHOLHDL 3.0 09/10/2020 1452   VLDL 20 09/10/2020 1452   LDLCALC 85 09/10/2020 1452   Lipid panel [employer] 7/24 total cholesterol 175, triglycerides 115, LDL 92, HDL 59.  Risk Assessment/Calculations:          Physical Exam:    VS:  BP (!) 155/72 (BP Location: Left Arm, Patient Position: Sitting, Cuff Size: Large)   Pulse 75   Resp 16   Ht 5\' 3"  (1.6 m)   Wt 177 lb (80.3 kg)   SpO2 98% Comment: 2 L  BMI 31.35 kg/m      Wt Readings from Last 3 Encounters:  03/31/24 177 lb (80.3 kg)  01/16/24 171 lb 6.4 oz (77.7 kg)  12/26/23 174 lb (78.9 kg)     GEN:  Well nourished, well developed in no acute distress HEENT: Normal NECK: No JVD; No carotid bruits CARDIAC: RRR, 2/6 systolic murmur RESPIRATORY: Diminished breath sounds, no wheezing ABDOMEN: Soft, non-tender, non-distended MUSCULOSKELETAL:  1+ edema; No deformity  SKIN: Warm and dry NEUROLOGIC:  Alert and oriented x 3 PSYCHIATRIC:  Normal affect   ASSESSMENT:    1. Leg edema   2. S/P TAVR (transcatheter aortic valve replacement)   3. PAF (paroxysmal atrial fibrillation) (HCC)   4. Coronary artery disease involving native coronary artery of native heart without angina pectoris   5. PAD (peripheral artery disease) (HCC)     PLAN:    In order of problems listed above:  1+ leg edema, increase Lasix  to 40 mg daily.  Check BMP in 10 days.  Continue Aldactone  25 mg daily. S/p TAVR 1/25.  Echo 01/2024 EF 60%, normal functioning TAVR valve. Paroxysmal atrial fibrillation, rhythm appears regular on exam.  Continue Amio 200 mg daily, Eliquis  5 mg twice daily. CAD (50% RCA, 30% LAD).  Denies chest pain.  Continue Eliquis , Lipitor 40 mg daily, Imdur  30 mg daily. EF 60%. PAD, brachial pseudoaneurysm s/p right brachial artery stent 11/24.  Keep follow-up with vascular surgery.  Follow-up in 2 months     Medication Adjustments/Labs and Tests Ordered: Current medicines are reviewed at length with the patient today.  Concerns regarding medicines are outlined above.  Orders Placed This Encounter  Procedures   Basic Metabolic Panel (BMET)   Meds ordered this encounter  Medications   furosemide  (LASIX ) 40 MG tablet    Sig: Take 1 tablet (40 mg total) by mouth daily.    Dispense:  90 tablet    Refill:  3    Patient Instructions  Medication Instructions:  Your physician recommends the following medication changes.  INCREASE: Lasix  to 40 mg daily     *If you need a refill on your cardiac medications before your next appointment, please call your pharmacy*  Lab Work: Your provider would like for you to return in  10 days to have the following labs drawn: BMP.   Please go to [facility] [address] ([facility] [address], [place] [address] You do not need an appointment.  They are open from 8 am- 4:30 pm.  Lunch from 1:00 pm- 2:00 pm You do not need to be fasting.  If you have labs (blood work) drawn today and your tests are completely normal, you will receive your results only by: MyChart Message (if you have MyChart) OR A paper copy in the mail If you have any lab test that is abnormal or we need to change your treatment, we will call you to review the results.  Testing/Procedures: No test ordered today   Follow-Up: At [facility], you and your health needs are our priority.  As part of our continuing mission to provide you with exceptional heart care, our providers are all part of one team.  This team includes your primary Cardiologist (physician) and Advanced Practice Providers or APPs (Physician Assistants and Nurse Practitioners) who all work together to provide you with the care you need, when you need it.  Your next appointment:   8 week(s)  Provider:   You may see Constancia Delton, MD or one of the following Advanced Practice Providers on your designated Care Team:   Laneta Pintos, NP Gildardo Labrador, PA-C Varney Gentleman, PA-C Cadence Hokah, PA-C Ronald Cockayne, NP Morey Ar, NP    We recommend signing up for the patient portal called "MyChart".  Sign up information is provided on this After Visit Summary.  MyChart is used to connect with patients for Virtual Visits (Telemedicine).  Patients are able to view lab/test results, encounter notes, upcoming appointments, etc.  Non-urgent messages can be sent to your provider as well.   To learn more about what you can do with MyChart,  go to [email or web address].         Signed, Constancia Delton, MD  03/31/2024 11:49 AM     [facility]

## 2024-03-31 NOTE — Patient Instructions (Signed)
 Medication Instructions:  Your physician recommends the following medication changes.  INCREASE: Lasix  to 40 mg daily    *If you need a refill on your cardiac medications before your next appointment, please call your pharmacy*  Lab Work: Your provider would like for you to return in 10 days to have the following labs drawn: BMP.   Please go to [facility] [address] ([facility] [address], [place] [address] You do not need an appointment.  They are open from 8 am- 4:30 pm.  Lunch from 1:00 pm- 2:00 pm You do not need to be fasting.  If you have labs (blood work) drawn today and your tests are completely normal, you will receive your results only by: MyChart Message (if you have MyChart) OR A paper copy in the mail If you have any lab test that is abnormal or we need to change your treatment, we will call you to review the results.  Testing/Procedures: No test ordered today   Follow-Up: At [facility], you and your health needs are our priority.  As part of our continuing mission to provide you with exceptional heart care, our providers are all part of one team.  This team includes your primary Cardiologist (physician) and Advanced Practice Providers or APPs (Physician Assistants and Nurse Practitioners) who all work together to provide you with the care you need, when you need it.  Your next appointment:   8 week(s)  Provider:   You may see Constancia Delton, MD or one of the following Advanced Practice Providers on your designated Care Team:   Laneta Pintos, NP Gildardo Labrador, PA-C Varney Gentleman, PA-C Cadence Banner, PA-C Ronald Cockayne, NP Morey Ar, NP    We recommend signing up for the patient portal called "MyChart".  Sign up information is provided on this After Visit Summary.  MyChart is used to connect with patients for Virtual Visits (Telemedicine).  Patients are able to view lab/test results, encounter notes, upcoming  appointments, etc.  Non-urgent messages can be sent to your provider as well.   To learn more about what you can do with MyChart, go to [email or web address].

## 2024-04-13 ENCOUNTER — Other Ambulatory Visit: Payer: Self-pay

## 2024-04-13 DIAGNOSIS — R600 Localized edema: Secondary | ICD-10-CM

## 2024-04-13 MED ORDER — POTASSIUM CHLORIDE CRYS ER 20 MEQ PO TBCR: 40.0000 meq | EXTENDED_RELEASE_TABLET | Freq: Every day | ORAL | 3 refills | Status: DC

## 2024-04-14 LAB — BASIC METABOLIC PANEL WITH GFR
BUN/Creatinine Ratio: 11 — ABNORMAL LOW (ref 12–28)
BUN: 11 mg/dL (ref 8–27)
CO2: 23 mmol/L (ref 20–29)
Calcium: 9.6 mg/dL (ref 8.7–10.3)
Chloride: 91 mmol/L — ABNORMAL LOW (ref 96–106)
Creatinine, Ser: 0.96 mg/dL (ref 0.57–1.00)
Glucose: 118 mg/dL — ABNORMAL HIGH (ref 70–99)
Potassium: 4.5 mmol/L (ref 3.5–5.2)
Sodium: 130 mmol/L — ABNORMAL LOW (ref 134–144)
eGFR: 60 mL/min/{1.73_m2} (ref >59)

## 2024-04-15 ENCOUNTER — Ambulatory Visit: Payer: Self-pay | Admitting: Cardiology

## 2024-04-22 ENCOUNTER — Emergency Department

## 2024-04-22 ENCOUNTER — Encounter: Payer: Self-pay | Admitting: *Deleted

## 2024-04-22 ENCOUNTER — Other Ambulatory Visit: Payer: Self-pay

## 2024-04-22 DIAGNOSIS — K219 Gastro-esophageal reflux disease without esophagitis: Secondary | ICD-10-CM | POA: Diagnosis present

## 2024-04-22 DIAGNOSIS — Z952 Presence of prosthetic heart valve: Secondary | ICD-10-CM

## 2024-04-22 DIAGNOSIS — Z79899 Other long term (current) drug therapy: Secondary | ICD-10-CM

## 2024-04-22 DIAGNOSIS — E039 Hypothyroidism, unspecified: Secondary | ICD-10-CM | POA: Diagnosis present

## 2024-04-22 DIAGNOSIS — F410 Panic disorder [episodic paroxysmal anxiety] without agoraphobia: Secondary | ICD-10-CM | POA: Diagnosis present

## 2024-04-22 DIAGNOSIS — E861 Hypovolemia: Secondary | ICD-10-CM | POA: Diagnosis present

## 2024-04-22 DIAGNOSIS — J9811 Atelectasis: Secondary | ICD-10-CM | POA: Diagnosis present

## 2024-04-22 DIAGNOSIS — Z7951 Long term (current) use of inhaled steroids: Secondary | ICD-10-CM

## 2024-04-22 DIAGNOSIS — I110 Hypertensive heart disease with heart failure: Secondary | ICD-10-CM | POA: Diagnosis present

## 2024-04-22 DIAGNOSIS — Z961 Presence of intraocular lens: Secondary | ICD-10-CM | POA: Diagnosis present

## 2024-04-22 DIAGNOSIS — Z87891 Personal history of nicotine dependence: Secondary | ICD-10-CM

## 2024-04-22 DIAGNOSIS — E871 Hypo-osmolality and hyponatremia: Secondary | ICD-10-CM | POA: Diagnosis not present

## 2024-04-22 DIAGNOSIS — Z9842 Cataract extraction status, left eye: Secondary | ICD-10-CM

## 2024-04-22 DIAGNOSIS — H9190 Unspecified hearing loss, unspecified ear: Secondary | ICD-10-CM | POA: Diagnosis present

## 2024-04-22 DIAGNOSIS — Z9049 Acquired absence of other specified parts of digestive tract: Secondary | ICD-10-CM

## 2024-04-22 DIAGNOSIS — I4891 Unspecified atrial fibrillation: Secondary | ICD-10-CM | POA: Diagnosis present

## 2024-04-22 DIAGNOSIS — G2581 Restless legs syndrome: Secondary | ICD-10-CM | POA: Diagnosis present

## 2024-04-22 DIAGNOSIS — J449 Chronic obstructive pulmonary disease, unspecified: Secondary | ICD-10-CM | POA: Diagnosis present

## 2024-04-22 DIAGNOSIS — E785 Hyperlipidemia, unspecified: Secondary | ICD-10-CM | POA: Diagnosis present

## 2024-04-22 DIAGNOSIS — I5032 Chronic diastolic (congestive) heart failure: Secondary | ICD-10-CM | POA: Diagnosis present

## 2024-04-22 DIAGNOSIS — Z79890 Hormone replacement therapy (postmenopausal): Secondary | ICD-10-CM

## 2024-04-22 DIAGNOSIS — T17590A Other foreign object in bronchus causing asphyxiation, initial encounter: Secondary | ICD-10-CM | POA: Diagnosis present

## 2024-04-22 DIAGNOSIS — Z888 Allergy status to other drugs, medicaments and biological substances status: Secondary | ICD-10-CM

## 2024-04-22 DIAGNOSIS — R10811 Right upper quadrant abdominal tenderness: Secondary | ICD-10-CM | POA: Diagnosis present

## 2024-04-22 DIAGNOSIS — I440 Atrioventricular block, first degree: Secondary | ICD-10-CM | POA: Diagnosis present

## 2024-04-22 DIAGNOSIS — J9809 Other diseases of bronchus, not elsewhere classified: Secondary | ICD-10-CM | POA: Diagnosis present

## 2024-04-22 DIAGNOSIS — W44F9XA Other object of natural or organic material, entering into or through a natural orifice, initial encounter: Secondary | ICD-10-CM | POA: Diagnosis present

## 2024-04-22 DIAGNOSIS — Z7901 Long term (current) use of anticoagulants: Secondary | ICD-10-CM

## 2024-04-22 LAB — BASIC METABOLIC PANEL WITH GFR
Anion gap: 10 (ref 5–15)
BUN: 13 mg/dL (ref 8–23)
CO2: 25 mmol/L (ref 22–32)
Calcium: 8.9 mg/dL (ref 8.9–10.3)
Chloride: 85 mmol/L — ABNORMAL LOW (ref 98–111)
Creatinine, Ser: 0.9 mg/dL (ref 0.44–1.00)
GFR, Estimated: >60 mL/min (ref >60)
Glucose, Bld: 121 mg/dL — ABNORMAL HIGH (ref 70–99)
Potassium: 3.4 mmol/L — ABNORMAL LOW (ref 3.5–5.1)
Sodium: 120 mmol/L — ABNORMAL LOW (ref 135–145)

## 2024-04-22 LAB — CBC
HCT: 29.7 % — ABNORMAL LOW (ref 36.0–46.0)
Hemoglobin: 9.8 g/dL — ABNORMAL LOW (ref 12.0–15.0)
MCH: 26.2 pg (ref 26.0–34.0)
MCHC: 33 g/dL (ref 30.0–36.0)
MCV: 79.4 fL — ABNORMAL LOW (ref 80.0–100.0)
Platelets: 438 10*3/uL — ABNORMAL HIGH (ref 150–400)
RBC: 3.74 MIL/uL — ABNORMAL LOW (ref 3.87–5.11)
RDW: 17.4 % — ABNORMAL HIGH (ref 11.5–15.5)
WBC: 6.8 10*3/uL (ref 4.0–10.5)
nRBC: 0 % (ref 0.0–0.2)

## 2024-04-22 LAB — TROPONIN I (HIGH SENSITIVITY): Troponin I (High Sensitivity): 12 ng/L (ref <18)

## 2024-04-22 LAB — PROTIME-INR
INR: 1.2 (ref 0.8–1.2)
Prothrombin Time: 15.4 s — ABNORMAL HIGH (ref 11.4–15.2)

## 2024-04-22 NOTE — ED Triage Notes (Signed)
 Pt brought in by ems from home with sob.  Pt has nausea and chest pain that started yesterday.  Pt on 2 liters oxygen [place].  Pt alert  iv in place.

## 2024-04-22 NOTE — ED Triage Notes (Signed)
 EMS brings pt in from home for [facility] since yesterday with  nausea; hx COPD, O2 at 2l/min via [place] at baseline

## 2024-04-23 ENCOUNTER — Inpatient Hospital Stay
Admission: EM | Admit: 2024-04-23 | Discharge: 2024-04-24 | DRG: 641 | Disposition: A | Discharge status: Home / Self Care | Attending: Obstetrics and Gynecology | Admitting: Obstetrics and Gynecology

## 2024-04-23 ENCOUNTER — Emergency Department

## 2024-04-23 ENCOUNTER — Other Ambulatory Visit: Payer: Self-pay

## 2024-04-23 DIAGNOSIS — G2581 Restless legs syndrome: Secondary | ICD-10-CM | POA: Diagnosis present

## 2024-04-23 DIAGNOSIS — Z7951 Long term (current) use of inhaled steroids: Secondary | ICD-10-CM | POA: Diagnosis not present

## 2024-04-23 DIAGNOSIS — I5032 Chronic diastolic (congestive) heart failure: Secondary | ICD-10-CM | POA: Diagnosis present

## 2024-04-23 DIAGNOSIS — Z87891 Personal history of nicotine dependence: Secondary | ICD-10-CM | POA: Diagnosis not present

## 2024-04-23 DIAGNOSIS — R10811 Right upper quadrant abdominal tenderness: Secondary | ICD-10-CM

## 2024-04-23 DIAGNOSIS — J449 Chronic obstructive pulmonary disease, unspecified: Secondary | ICD-10-CM | POA: Diagnosis present

## 2024-04-23 DIAGNOSIS — E785 Hyperlipidemia, unspecified: Secondary | ICD-10-CM | POA: Diagnosis present

## 2024-04-23 DIAGNOSIS — F419 Anxiety disorder, unspecified: Secondary | ICD-10-CM | POA: Diagnosis present

## 2024-04-23 DIAGNOSIS — E861 Hypovolemia: Secondary | ICD-10-CM | POA: Diagnosis present

## 2024-04-23 DIAGNOSIS — J9811 Atelectasis: Secondary | ICD-10-CM | POA: Diagnosis present

## 2024-04-23 DIAGNOSIS — Z9842 Cataract extraction status, left eye: Secondary | ICD-10-CM | POA: Diagnosis not present

## 2024-04-23 DIAGNOSIS — Z79899 Other long term (current) drug therapy: Secondary | ICD-10-CM | POA: Diagnosis not present

## 2024-04-23 DIAGNOSIS — Z952 Presence of prosthetic heart valve: Secondary | ICD-10-CM

## 2024-04-23 DIAGNOSIS — I10 Essential (primary) hypertension: Secondary | ICD-10-CM | POA: Diagnosis present

## 2024-04-23 DIAGNOSIS — H9190 Unspecified hearing loss, unspecified ear: Secondary | ICD-10-CM | POA: Diagnosis present

## 2024-04-23 DIAGNOSIS — Z79890 Hormone replacement therapy (postmenopausal): Secondary | ICD-10-CM | POA: Diagnosis not present

## 2024-04-23 DIAGNOSIS — T17590A Other foreign object in bronchus causing asphyxiation, initial encounter: Secondary | ICD-10-CM | POA: Diagnosis present

## 2024-04-23 DIAGNOSIS — E871 Hypo-osmolality and hyponatremia: Principal | ICD-10-CM

## 2024-04-23 DIAGNOSIS — Z888 Allergy status to other drugs, medicaments and biological substances status: Secondary | ICD-10-CM | POA: Diagnosis not present

## 2024-04-23 DIAGNOSIS — Z7901 Long term (current) use of anticoagulants: Secondary | ICD-10-CM | POA: Diagnosis not present

## 2024-04-23 DIAGNOSIS — R079 Chest pain, unspecified: Secondary | ICD-10-CM | POA: Diagnosis present

## 2024-04-23 DIAGNOSIS — I4891 Unspecified atrial fibrillation: Secondary | ICD-10-CM | POA: Diagnosis present

## 2024-04-23 DIAGNOSIS — I110 Hypertensive heart disease with heart failure: Secondary | ICD-10-CM | POA: Diagnosis present

## 2024-04-23 DIAGNOSIS — K219 Gastro-esophageal reflux disease without esophagitis: Secondary | ICD-10-CM | POA: Diagnosis present

## 2024-04-23 DIAGNOSIS — W44F9XA Other object of natural or organic material, entering into or through a natural orifice, initial encounter: Secondary | ICD-10-CM | POA: Diagnosis present

## 2024-04-23 DIAGNOSIS — Z961 Presence of intraocular lens: Secondary | ICD-10-CM | POA: Diagnosis present

## 2024-04-23 DIAGNOSIS — E039 Hypothyroidism, unspecified: Secondary | ICD-10-CM | POA: Diagnosis present

## 2024-04-23 DIAGNOSIS — J9809 Other diseases of bronchus, not elsewhere classified: Secondary | ICD-10-CM | POA: Diagnosis present

## 2024-04-23 DIAGNOSIS — R109 Unspecified abdominal pain: Secondary | ICD-10-CM

## 2024-04-23 LAB — RESPIRATORY PANEL BY PCR

## 2024-04-23 LAB — URINALYSIS, ROUTINE W REFLEX MICROSCOPIC
Bacteria, UA: NONE SEEN
Bilirubin Urine: NEGATIVE
Glucose, UA: NEGATIVE mg/dL
Ketones, ur: NEGATIVE mg/dL
Leukocytes,Ua: NEGATIVE
Nitrite: NEGATIVE
Protein, ur: NEGATIVE mg/dL
Specific Gravity, Urine: 1.018 (ref 1.005–1.030)
pH: 7 (ref 5.0–8.0)

## 2024-04-23 LAB — HEPATIC FUNCTION PANEL
ALT: 26 U/L (ref 0–44)
AST: 35 U/L (ref 15–41)
Albumin: 4.1 g/dL (ref 3.5–5.0)
Alkaline Phosphatase: 95 U/L (ref 38–126)
Bilirubin, Direct: <0.1 mg/dL (ref 0.0–0.2)
Total Bilirubin: 0.5 mg/dL (ref 0.0–1.2)
Total Protein: 7.5 g/dL (ref 6.5–8.1)

## 2024-04-23 LAB — SODIUM, URINE, RANDOM: Sodium, Ur: 17 mmol/L

## 2024-04-23 LAB — BASIC METABOLIC PANEL WITH GFR
Anion gap: 14 (ref 5–15)
BUN: 11 mg/dL (ref 8–23)
CO2: 24 mmol/L (ref 22–32)
Calcium: 9.8 mg/dL (ref 8.9–10.3)
Chloride: 90 mmol/L — ABNORMAL LOW (ref 98–111)
Creatinine, Ser: 0.93 mg/dL (ref 0.44–1.00)
GFR, Estimated: >60 mL/min (ref >60)
Glucose, Bld: 112 mg/dL — ABNORMAL HIGH (ref 70–99)
Potassium: 3.6 mmol/L (ref 3.5–5.1)
Sodium: 128 mmol/L — ABNORMAL LOW (ref 135–145)

## 2024-04-23 LAB — RESP PANEL BY RT-PCR (RSV, FLU A&B, COVID)  RVPGX2
Influenza A by PCR: NEGATIVE
Influenza B by PCR: NEGATIVE
Resp Syncytial Virus by PCR: NEGATIVE
SARS Coronavirus 2 by RT PCR: NEGATIVE

## 2024-04-23 LAB — LIPASE, BLOOD: Lipase: 34 U/L (ref 11–51)

## 2024-04-23 LAB — TSH: TSH: 4.185 u[IU]/mL (ref 0.350–4.500)

## 2024-04-23 LAB — OSMOLALITY: Osmolality: 266 mosm/kg — ABNORMAL LOW (ref 275–295)

## 2024-04-23 LAB — TROPONIN I (HIGH SENSITIVITY): Troponin I (High Sensitivity): 12 ng/L (ref <18)

## 2024-04-23 LAB — MAGNESIUM: Magnesium: 2.1 mg/dL (ref 1.7–2.4)

## 2024-04-23 LAB — C-REACTIVE PROTEIN: CRP: <0.5 mg/dL (ref <1.0)

## 2024-04-23 MED ORDER — ACETYLCYSTEINE 20 % IN SOLN
4.0000 mL | Freq: Two times a day (BID) | RESPIRATORY_TRACT | Status: DC
  Administered 2024-04-23 – 2024-04-24 (×2): 4 mL via RESPIRATORY_TRACT
  Filled 2024-04-23 (×2): qty 4

## 2024-04-23 MED ORDER — ACETYLCYSTEINE 20 % IN SOLN
4.0000 mL | Freq: Two times a day (BID) | RESPIRATORY_TRACT | Status: DC
  Filled 2024-04-23: qty 4

## 2024-04-23 MED ORDER — ALBUTEROL SULFATE (2.5 MG/3ML) 0.083% IN NEBU
2.5000 mg | INHALATION_SOLUTION | Freq: Three times a day (TID) | RESPIRATORY_TRACT | Status: DC
  Administered 2024-04-23 – 2024-04-24 (×2): 2.5 mg via RESPIRATORY_TRACT
  Filled 2024-04-23 (×2): qty 3

## 2024-04-23 MED ORDER — PANTOPRAZOLE SODIUM 40 MG PO TBEC
40.0000 mg | DELAYED_RELEASE_TABLET | Freq: Two times a day (BID) | ORAL | Status: DC
  Administered 2024-04-23 – 2024-04-24 (×2): 40 mg via ORAL
  Filled 2024-04-23 (×2): qty 1

## 2024-04-23 MED ORDER — ACETYLCYSTEINE 20 % IN SOLN
4.0000 mL | Freq: Three times a day (TID) | RESPIRATORY_TRACT | Status: DC
  Filled 2024-04-23 (×2): qty 4

## 2024-04-23 MED ORDER — HYDROXYZINE HCL 10 MG PO TABS: 10.0000 mg | ORAL_TABLET | Freq: Three times a day (TID) | ORAL | Status: DC | PRN

## 2024-04-23 MED ORDER — SODIUM CHLORIDE 0.9 % IV BOLUS (SEPSIS)
500.0000 mL | Freq: Once | INTRAVENOUS | Status: AC
  Administered 2024-04-23: 500 mL via INTRAVENOUS

## 2024-04-23 MED ORDER — ACETYLCYSTEINE 20 % IN SOLN
4.0000 mL | Freq: Three times a day (TID) | RESPIRATORY_TRACT | Status: DC
  Filled 2024-04-23: qty 4

## 2024-04-23 MED ORDER — IOHEXOL 350 MG/ML SOLN
100.0000 mL | Freq: Once | INTRAVENOUS | Status: AC | PRN
  Administered 2024-04-23: 100 mL via INTRAVENOUS

## 2024-04-23 MED ORDER — AMIODARONE HCL 200 MG PO TABS
200.0000 mg | ORAL_TABLET | Freq: Every day | ORAL | Status: DC
  Administered 2024-04-23 – 2024-04-24 (×2): 200 mg via ORAL
  Filled 2024-04-23 (×2): qty 1

## 2024-04-23 MED ORDER — ACETAMINOPHEN 650 MG RE SUPP: 650.0000 mg | Freq: Four times a day (QID) | RECTAL | Status: DC | PRN

## 2024-04-23 MED ORDER — GABAPENTIN 100 MG PO CAPS
200.0000 mg | ORAL_CAPSULE | ORAL | Status: AC
  Administered 2024-04-23: 200 mg via ORAL
  Filled 2024-04-23: qty 2

## 2024-04-23 MED ORDER — ROPINIROLE HCL 0.25 MG PO TABS: 0.2500 mg | ORAL_TABLET | Freq: Every day | ORAL | Status: DC

## 2024-04-23 MED ORDER — GABAPENTIN 100 MG PO CAPS
200.0000 mg | ORAL_CAPSULE | Freq: Three times a day (TID) | ORAL | Status: DC | PRN
  Administered 2024-04-23: 200 mg via ORAL
  Filled 2024-04-23: qty 2

## 2024-04-23 MED ORDER — MONTELUKAST SODIUM 10 MG PO TABS
10.0000 mg | ORAL_TABLET | Freq: Every day | ORAL | Status: DC
  Administered 2024-04-23: 10 mg via ORAL
  Filled 2024-04-23: qty 1

## 2024-04-23 MED ORDER — ASPIRIN 81 MG PO CHEW
324.0000 mg | CHEWABLE_TABLET | Freq: Once | ORAL | Status: AC
  Administered 2024-04-23: 324 mg via ORAL
  Filled 2024-04-23: qty 4

## 2024-04-23 MED ORDER — LEVOTHYROXINE SODIUM 75 MCG PO TABS
75.0000 ug | ORAL_TABLET | Freq: Every day | ORAL | Status: DC
  Administered 2024-04-24: 75 ug via ORAL
  Filled 2024-04-23: qty 1

## 2024-04-23 MED ORDER — ACETAMINOPHEN 325 MG PO TABS: 650.0000 mg | ORAL_TABLET | Freq: Four times a day (QID) | ORAL | Status: DC | PRN

## 2024-04-23 MED ORDER — APIXABAN 5 MG PO TABS
5.0000 mg | ORAL_TABLET | Freq: Two times a day (BID) | ORAL | Status: DC
  Administered 2024-04-23 – 2024-04-24 (×3): 5 mg via ORAL
  Filled 2024-04-23 (×3): qty 1

## 2024-04-23 MED ORDER — ROPINIROLE HCL 0.25 MG PO TABS
0.2500 mg | ORAL_TABLET | Freq: Once | ORAL | Status: AC
  Administered 2024-04-23: 0.25 mg via ORAL
  Filled 2024-04-23: qty 1

## 2024-04-23 MED ORDER — SODIUM CHLORIDE 0.9 % IV SOLN: INTRAVENOUS | Status: DC

## 2024-04-23 MED ORDER — ATORVASTATIN CALCIUM 20 MG PO TABS
40.0000 mg | ORAL_TABLET | Freq: Every day | ORAL | Status: DC
  Administered 2024-04-23 – 2024-04-24 (×2): 40 mg via ORAL
  Filled 2024-04-23 (×2): qty 2

## 2024-04-23 MED ORDER — ALPRAZOLAM 0.25 MG PO TABS: 0.2500 mg | ORAL_TABLET | Freq: Every evening | ORAL | Status: DC | PRN

## 2024-04-23 MED ORDER — FLUTICASONE FUROATE-VILANTEROL 200-25 MCG/ACT IN AEPB
1.0000 | INHALATION_SPRAY | Freq: Every day | RESPIRATORY_TRACT | Status: DC
  Administered 2024-04-23 – 2024-04-24 (×2): 1 via RESPIRATORY_TRACT
  Filled 2024-04-23 (×2): qty 28

## 2024-04-23 MED ORDER — POLYETHYLENE GLYCOL 3350 17 G PO PACK: 17.0000 g | PACK | Freq: Every day | ORAL | Status: DC | PRN

## 2024-04-23 MED ORDER — LORAZEPAM 1 MG PO TABS
1.0000 mg | ORAL_TABLET | Freq: Once | ORAL | Status: AC
  Administered 2024-04-23: 1 mg via ORAL
  Filled 2024-04-23: qty 1

## 2024-04-23 MED ORDER — ALBUTEROL SULFATE (2.5 MG/3ML) 0.083% IN NEBU
2.5000 mg | INHALATION_SOLUTION | Freq: Three times a day (TID) | RESPIRATORY_TRACT | Status: DC
  Administered 2024-04-23: 2.5 mg via RESPIRATORY_TRACT
  Filled 2024-04-23: qty 3

## 2024-04-23 MED ORDER — POTASSIUM CHLORIDE 20 MEQ PO PACK
20.0000 meq | PACK | Freq: Once | ORAL | Status: AC
  Administered 2024-04-23: 20 meq via ORAL
  Filled 2024-04-23: qty 1

## 2024-04-23 MED ORDER — IPRATROPIUM-ALBUTEROL 0.5-2.5 (3) MG/3ML IN SOLN
3.0000 mL | Freq: Four times a day (QID) | RESPIRATORY_TRACT | Status: DC | PRN
  Administered 2024-04-23: 3 mL via RESPIRATORY_TRACT
  Filled 2024-04-23: qty 3

## 2024-04-23 NOTE — ED Notes (Signed)
 Meds just verified by pharmacy at this time. NO times changed on meds.

## 2024-04-23 NOTE — Assessment & Plan Note (Signed)
 This is chronic, continue with pantoprazole 

## 2024-04-23 NOTE — ED Notes (Signed)
Patient states that she is unable to urinate at this time.

## 2024-04-23 NOTE — Assessment & Plan Note (Signed)
 With chronic 2lpm suplmentary oxygen now presenting with acute worsening sob, though no new hypoxia at rest. Likely due to mucoid impaction of lower lobes as seen on CT. Will treat with continued montelukast, inhaled corticosteroid and LABA and add mucomyst therapy.  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 D/w Dr. Celester Colace. Will wait for his advice.

## 2024-04-23 NOTE — Assessment & Plan Note (Signed)
 Continue with core

## 2024-04-23 NOTE — Assessment & Plan Note (Signed)
 See HPI and exam. This is incidental. Will check LFTs and proceed from there CT abd non actionable.

## 2024-04-23 NOTE — ED Notes (Signed)
 20 path swab sent

## 2024-04-23 NOTE — ED Notes (Signed)
 Pt ambulated to the bathroom at this time. Standby assist provided. Pt gait steady, no assistance needed.

## 2024-04-23 NOTE — H&P (Addendum)
 History and Physical    Patient: Linda Brady:[id number] DOB: [birth year] DOA: 04/23/2024 DOS: the patient was seen and examined on 04/23/2024 PCP: Lyle San, MD  Patient coming from: Home  Chief Complaint:  Chief Complaint  Patient presents with   Shortness of Breath   HPI: Linda Brady is a 83 y.o. 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  Patient has diagnosis of COPD and chronically uses 2 L/min of supplementary oxygen.  Patient does not have any shortness of breath at rest.  Patient seems to be in her usual state of health until approximately May 22, yesterday earlier in the afternoon when she reports sharp anterior chest pain on and off present at rest without radiation exacerbating or relieving factor.  This was associated with nausea and some sweating/diaphoresis per patient.  This was not associated with any fever.  Patient felt flushed.  Patient also reports associated sensation of panic/anxiety.  With associated sensation of shortness of breath present at rest.  Patient denies any new cough or expectoration although she has had a mild cough last several days.  She denies any worsening of her chronic leg swelling.  Patient presented to the ER with above complaints.  During evaluation in the ER patient was noted to have also right upper quadrant tenderness.  However patient denies having had any pain there.  Patient is s/p CT chest abdomen pelvis, troponin testing which is not remarkable.  Patient symptoms have actually improved.  Patient has had prior attacks of panic per patient.  And her anxiety is related to living alone as per patient.  During part of the workup patient was ordered noted to have a sodium of 120 mEq/L.  Medical evaluation sought.  Again patient denies any  vomiting or diarrhea or any marked reduced in her p.o. intake.  Patient does take Lasix  at home. Currently asymptomatic excpet for sensation of mild sob while at rest.  Review of Systems: As mentioned in the history of present illness. All other systems reviewed and are negative. 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 Lorie Rook and Dr. Honey Lusty   Thyroid  disease    Past Surgical History:  Procedure Laterality Date   BREAST CYST ASPIRATION Left 10 plus yrs   benign-twice   CATARACT EXTRACTION W/PHACO Left 03/20/2023   Procedure: CATARACT EXTRACTION PHACO AND INTRAOCULAR LENS PLACEMENT (IOC) LEFT VISION BLUE;  Surgeon: Trudi Fus, MD;  Location: [facility];  Service: Ophthalmology;  Laterality: Left;  21.02   01:38.2   INTRAOPERATIVE TRANSTHORACIC ECHOCARDIOGRAM N/A 12/16/2023   Procedure: INTRAOPERATIVE TRANSTHORACIC ECHOCARDIOGRAM;  Surgeon: Kyra Phy, MD;  Location: [facility] INVASIVE CV LAB;  Service: Cardiovascular;  Laterality: N/A;   LEFT HEART CATH AND CORONARY ANGIOGRAPHY N/A 09/11/2020   Procedure: LEFT HEART CATH AND CORONARY ANGIOGRAPHY;  Surgeon: Percival Brace, MD;  Location: [facility] INVASIVE CV LAB;  Service: Cardiovascular;  Laterality: N/A;   PERIPHERAL VASCULAR INTERVENTION Right 10/24/2023   Procedure: PERIPHERAL VASCULAR INTERVENTION;  Surgeon: Carlene Che, MD;  Location: [facility] INVASIVE CV LAB;  Service: Cardiovascular;  Laterality: Right;  right axillary stent   RIGHT/LEFT HEART CATH AND CORONARY ANGIOGRAPHY N/A 10/23/2023   Procedure: RIGHT/LEFT HEART CATH AND CORONARY ANGIOGRAPHY;  Surgeon: Kyra Phy, MD;  Location: [facility] INVASIVE CV LAB;   Service: Cardiovascular;  Laterality: N/A;   UPPER EXTREMITY ANGIOGRAPHY Right 10/24/2023   Procedure: Upper Extremity Angiography;  Surgeon: Carlene Che, MD;  Location: [facility] INVASIVE CV LAB;  Service: Cardiovascular;  Laterality: Right;   Social History:  reports that she has quit smoking. Her smoking use included cigarettes. She has a 6 pack-year smoking history. She has never used smokeless tobacco. She reports that she does not currently use alcohol. She reports that she does not currently use drugs.  Allergies  Allergen Reactions   Naproxen Sodium Anaphylaxis    Family History  Problem Relation Age of Onset   Breast cancer Neg Hx     Prior to Admission medications   Medication Sig Start Date End Date Taking? Authorizing Provider  ALPRAZolam  (XANAX ) 0.25 MG tablet Take 0.25 mg by mouth at bedtime as needed for sleep.    [provider]  amiodarone  (PACERONE ) 200 MG tablet Take 1 tablet (200 mg total) by mouth daily. 01/16/24   Ardia Kraft, PA-C  apixaban  (ELIQUIS ) 5 MG TABS tablet Take 5 mg by mouth 2 (two) times daily.    [provider]  atorvastatin  (LIPITOR) 40 MG tablet Take 1 tablet (40 mg total) by mouth daily. 09/20/23   Alexander, Natalie, DO  cholecalciferol  (VITAMIN D3) 25 MCG (1000 UNIT) tablet Take 1,000 Units by mouth daily.    [provider]  FLUoxetine  (PROZAC ) 10 MG capsule Take 2 capsules (20 mg total) by mouth daily as needed (anxiety). 11/17/23 03/31/24  Jacquie Maudlin, MD  furosemide  (LASIX ) 40 MG tablet Take 1 tablet (40 mg total) by mouth daily. 03/31/24 06/29/24  Constancia Delton, MD  Ipratropium-Albuterol  (COMBIVENT  RESPIMAT) 20-100 MCG/ACT AERS respimat Inhale 1 puff into the lungs every 6 (six) hours as needed for wheezing.    [provider]  isosorbide  mononitrate (IMDUR ) 30 MG 24 hr tablet Take 1 tablet (30 mg total) by mouth daily. 10/14/22   Furth, Cadence H, PA-C  levalbuterol  (XOPENEX ) 1.25 MG/3ML  nebulizer solution Take 1.25 mg by nebulization every 6 (six) hours as needed. 12/08/23 12/07/24  [provider]  levothyroxine  (SYNTHROID ) 75 MCG tablet Take 75 mcg by mouth daily before breakfast. 05/02/20   [provider]  mometasone -formoterol  (DULERA ) 200-5 MCG/ACT AERO Inhale 2 puffs into the lungs 2 (two) times daily. 12/17/23   Ardia Kraft, PA-C  montelukast (SINGULAIR) 10 MG tablet Take 1 tablet by mouth at bedtime. 02/03/24 02/02/25  [provider]  pantoprazole  (PROTONIX ) 40 MG tablet Take 1 tablet (40 mg total) by mouth 2 (two) times daily. 11/17/23 03/31/24  Jacquie Maudlin, MD  potassium chloride  SA (KLOR-CON  M) 20 MEQ tablet Take 2 tablets (40 mEq total) by mouth daily. 04/13/24   Constancia Delton, MD  rOPINIRole  (REQUIP ) 0.25 MG tablet Take 1 tablet (0.25 mg total) by mouth at bedtime. 10/28/23 03/31/24  Magdalene School, MD  spironolactone  (ALDACTONE ) 25 MG tablet Take 1 tablet (25 mg total) by mouth daily. 12/26/23 03/31/24  Ardia Kraft, PA-C  sucralfate  (CARAFATE ) 1 g tablet Take 1 tablet (1 g total) by mouth 4 (four) times daily. 11/17/23   Jacquie Maudlin, MD  vitamin B-12 (CYANOCOBALAMIN ) 1000 MCG tablet Take 1,000 mcg by mouth daily.  [provider]    Physical Exam: Vitals:   04/23/24 0549 04/23/24 0600 04/23/24 0645 04/23/24 0700  BP:  116/63  (!) 99/51  Pulse:  79 74 65  Resp:  20 19 19   Temp: 98.6 F (37 C)     TempSrc: Oral     SpO2:  100% 100% 100%  Weight:      Height:       General: Patient reports being chronically hard of hearing, her hearing aid battery is running low at this time.  Patient is on 2 L/min of supplementary oxygen saturating 100%.  Patient appears to be in no distress.  Gives a coherent account of her symptoms Respiratory exam: Left posterior basilar breath sounds have some crackles with scant expiratory wheezes, otherwise diffusely diminished breath sounds without adventitious breath  sounds Cardiovascular exam S1-S2 normal, systolic murmur Abdomen all quadrants are soft, patient reports some right upper quadrant tenderness.  No guarding no rebound again there is no pain unless I press Extremities warm currently no evidence of edema. Data Reviewed:  Labs on Admission:  Results for orders placed or performed during the hospital encounter of 04/23/24 (from the past 24 hours)  Basic metabolic panel     Status: Abnormal   Collection Time: 04/22/24 10:42 PM  Result Value Ref Range   Sodium 120 (L) 135 - 145 mmol/L   Potassium 3.4 (L) 3.5 - 5.1 mmol/L   Chloride 85 (L) 98 - 111 mmol/L   CO2 25 22 - 32 mmol/L   Glucose, Bld 121 (H) 70 - 99 mg/dL   BUN 13 8 - 23 mg/dL   Creatinine, Ser 1.61 0.44 - 1.00 mg/dL   Calcium  8.9 8.9 - 10.3 mg/dL   GFR, Estimated >09 >60 mL/min   Anion gap 10 5 - 15  CBC     Status: Abnormal   Collection Time: 04/22/24 10:42 PM  Result Value Ref Range   WBC 6.8 4.0 - 10.5 K/uL   RBC 3.74 (L) 3.87 - 5.11 MIL/uL   Hemoglobin 9.8 (L) 12.0 - 15.0 g/dL   HCT 45.4 (L) 09.8 - 11.9 %   MCV 79.4 (L) 80.0 - 100.0 fL   MCH 26.2 26.0 - 34.0 pg   MCHC 33.0 30.0 - 36.0 g/dL   RDW 14.7 (H) 82.9 - 56.2 %   Platelets 438 (H) 150 - 400 K/uL   nRBC 0.0 0.0 - 0.2 %  Troponin I (High Sensitivity)     Status: None   Collection Time: 04/22/24 10:42 PM  Result Value Ref Range   Troponin I (High Sensitivity) 12 <18 ng/L  Protime-INR (order if Patient is taking Coumadin / Warfarin)     Status: Abnormal   Collection Time: 04/22/24 10:42 PM  Result Value Ref Range   Prothrombin Time 15.4 (H) 11.4 - 15.2 seconds   INR 1.2 0.8 - 1.2  Troponin I (High Sensitivity)     Status: None   Collection Time: 04/23/24  2:08 AM  Result Value Ref Range   Troponin I (High Sensitivity) 12 <18 ng/L  Magnesium      Status: None   Collection Time: 04/23/24  2:08 AM  Result Value Ref Range   Magnesium  2.1 1.7 - 2.4 mg/dL  Urinalysis, Routine w reflex microscopic -Urine, Clean  Catch     Status: Abnormal   Collection Time: 04/23/24  5:06 AM  Result Value Ref Range   Color, Urine STRAW (A) YELLOW   APPearance CLEAR (A) CLEAR  Specific Gravity, Urine 1.018 1.005 - 1.030   pH 7.0 5.0 - 8.0   Glucose, UA NEGATIVE NEGATIVE mg/dL   Hgb urine dipstick SMALL (A) NEGATIVE   Bilirubin Urine NEGATIVE NEGATIVE   Ketones, ur NEGATIVE NEGATIVE mg/dL   Protein, ur NEGATIVE NEGATIVE mg/dL   Nitrite NEGATIVE NEGATIVE   Leukocytes,Ua NEGATIVE NEGATIVE   RBC / HPF 0-5 0 - 5 RBC/hpf   WBC, UA 0-5 0 - 5 WBC/hpf   Bacteria, UA NONE SEEN NONE SEEN   Squamous Epithelial / HPF 0-5 0 - 5 /HPF  Sodium, urine, random     Status: None   Collection Time: 04/23/24  5:06 AM  Result Value Ref Range   Sodium, Ur 17 mmol/L   Basic Metabolic Panel: Recent Labs  Lab 04/22/24 2242 04/23/24 0208  NA 120*  --   K 3.4*  --   CL 85*  --   CO2 25  --   GLUCOSE 121*  --   BUN 13  --   CREATININE 0.90  --   CALCIUM  8.9  --   MG  --  2.1   Liver Function Tests: No results for input(s): "AST", "ALT", "ALKPHOS", "BILITOT", "PROT", "ALBUMIN" in the last 168 hours. No results for input(s): "LIPASE", "AMYLASE" in the last 168 hours. No results for input(s): "AMMONIA" in the last 168 hours. CBC: Recent Labs  Lab 04/22/24 2242  WBC 6.8  HGB 9.8*  HCT 29.7*  MCV 79.4*  PLT 438*   Cardiac Enzymes: Recent Labs  Lab 04/22/24 2242 04/23/24 0208  TROPONINIHS 12 12    BNP (last 3 results) Recent Labs    10/15/23 1521 12/26/23 1104  PROBNP 533 323   CBG: No results for input(s): "GLUCAP" in the last 168 hours.  Radiological Exams on Admission:  CT Angio Chest PE W and/or Wo Contrast Result Date: 04/23/2024 CLINICAL DATA:  Shortness of breath and right lower quadrant pain EXAM: CT ANGIOGRAPHY CHEST CT ABDOMEN AND PELVIS WITH CONTRAST TECHNIQUE: Multidetector CT imaging of the chest was performed using the standard protocol during bolus administration of intravenous contrast.  Multiplanar CT image reconstructions and MIPs were obtained to evaluate the vascular anatomy. Multidetector CT imaging of the abdomen and pelvis was performed using the standard protocol during bolus administration of intravenous contrast. RADIATION DOSE REDUCTION: This exam was performed according to the departmental dose-optimization program which includes automated exposure control, adjustment of the mA and/or kV according to patient size and/or use of iterative reconstruction technique. CONTRAST:  OMNIPAQUE  IOHEXOL  350 MG/ML SOLN COMPARISON:  Chest CTA 10/28/2023 FINDINGS: CTA CHEST FINDINGS Cardiovascular: Satisfactory opacification of the pulmonary arteries to the segmental level. No evidence of pulmonary embolism. Enlarged heart with thickened left ventricle. Unchanged Transcatheter aortic valve replacement. Extensive atheromatous calcification of the aorta and great vessels. Mediastinum/Nodes: Negative for mass or adenopathy. Small hiatal hernia with no esophageal thickening. Lungs/Pleura: Generalized airway thickening with intermittent tracheal and bronchial narrowing compatible with tracheal bronchomalacia. Bilateral lower lobe bronchi are collapsed and intermittently opacified with mild hazy density and volume loss at the lung bases where there is streaky scarring. There is no edema, acute airspace disease, effusion, or pneumothorax. Musculoskeletal: No acute or aggressive finding Review of the MIP images confirms the above findings. CT ABDOMEN and PELVIS FINDINGS Hepatobiliary: No focal liver abnormality.Cholecystectomy. No biliary dilatation Pancreas: Unremarkable. Spleen: Unremarkable. Adrenals/Urinary Tract: Negative adrenals. No hydronephrosis or stone. Bilateral renal cystic densities. No worrisome renal lesion when accounting for areas of motion artifact  and cyst distortion. Unremarkable bladder. Stomach/Bowel:  No obstruction. No visible bowel inflammation. Vascular/Lymphatic: No acute  vascular abnormality. Extensive atheromatous calcification. No mass or adenopathy. Reproductive:Hysterectomy. Other: No ascites or pneumoperitoneum. Musculoskeletal: No acute abnormalities. 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 Review of the MIP images confirms the above findings. IMPRESSION: No acute finding in the chest or abdomen. 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 Chronic scarring and atelectasis in the lower lungs. Atherosclerosis. Electronically Signed   By: Ronnette Coke M.D.   On: 04/23/2024 05:12   CT ABDOMEN PELVIS W CONTRAST Result Date: 04/23/2024 CLINICAL DATA:  Shortness of breath and right lower quadrant pain EXAM: CT ANGIOGRAPHY CHEST CT ABDOMEN AND PELVIS WITH CONTRAST TECHNIQUE: Multidetector CT imaging of the chest was performed using the standard protocol during bolus administration of intravenous contrast. Multiplanar CT image reconstructions and MIPs were obtained to evaluate the vascular anatomy. Multidetector CT imaging of the abdomen and pelvis was performed using the standard protocol during bolus administration of intravenous contrast. RADIATION DOSE REDUCTION: This exam was performed according to the departmental dose-optimization program which includes automated exposure control, adjustment of the mA and/or kV according to patient size and/or use of iterative reconstruction technique. CONTRAST:  OMNIPAQUE  IOHEXOL  350 MG/ML SOLN COMPARISON:  Chest CTA 10/28/2023 FINDINGS: CTA CHEST FINDINGS Cardiovascular: Satisfactory opacification of the pulmonary arteries to the segmental level. No evidence of pulmonary embolism. Enlarged heart with thickened left ventricle. Unchanged Transcatheter aortic valve replacement. Extensive atheromatous calcification of the aorta and great vessels. Mediastinum/Nodes: Negative for mass or adenopathy. Small hiatal hernia with no esophageal thickening. Lungs/Pleura: Generalized airway thickening with  intermittent tracheal and bronchial narrowing compatible with tracheal bronchomalacia. Bilateral lower lobe bronchi are collapsed and intermittently opacified with mild hazy density and volume loss at the lung bases where there is streaky scarring. There is no edema, acute airspace disease, effusion, or pneumothorax. Musculoskeletal: No acute or aggressive finding Review of the MIP images confirms the above findings. CT ABDOMEN and PELVIS FINDINGS Hepatobiliary: No focal liver abnormality.Cholecystectomy. No biliary dilatation Pancreas: Unremarkable. Spleen: Unremarkable. Adrenals/Urinary Tract: Negative adrenals. No hydronephrosis or stone. Bilateral renal cystic densities. No worrisome renal lesion when accounting for areas of motion artifact and cyst distortion. Unremarkable bladder. Stomach/Bowel:  No obstruction. No visible bowel inflammation. Vascular/Lymphatic: No acute vascular abnormality. Extensive atheromatous calcification. No mass or adenopathy. Reproductive:Hysterectomy. Other: No ascites or pneumoperitoneum. Musculoskeletal: No acute abnormalities. 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 Review of the MIP images confirms the above findings. IMPRESSION: No acute finding in the chest or abdomen. 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 Chronic scarring and atelectasis in the lower lungs. Atherosclerosis. Electronically Signed   By: Ronnette Coke M.D.   On: 04/23/2024 05:12   DG Chest Port 1 View Result Date: 04/22/2024 CLINICAL DATA:  Chest pain and shortness of breath EXAM: PORTABLE CHEST 1 VIEW COMPARISON:  03/27/2024 FINDINGS: Cardiac shadow is stable. Prior TAVR is seen. Aortic calcifications noted. Mild vascular congestion is noted. No focal infiltrate or effusion is seen. No bony abnormality is noted. Mild scarring is noted in the bases. IMPRESSION: Chronic changes. Mild vascular congestion. Electronically Signed   By: Violeta Grey M.D.   On:  04/22/2024 23:04    chest X-ray  EKG: Independently reviewed. NSR  No intake/output data recorded. No intake/output data recorded.     Assessment and Plan: * Hyponatremia Patient uses Lasix  chronically for leg edema.  Patient clinically currently appears euvolemic.  However  noted to have sodium of 120.  With reduced urinary sodium suggestive of prerenal hyponatremia.  Patient has already been started on sodium chloride  infusion.  I anticipate that her sodium will slowly improve with above treatment.  Patient hyponatremia is moderate chronic and asymptomatic.  I will maintain the patient on regular diet however with fluid restriction to prevent patient from developing hyponatremia again while she gets her Lasix  therapy.  COPD (chronic obstructive pulmonary disease) (HCC) With chronic 2lpm suplmentary oxygen now presenting with acute worsening sob, though no new hypoxia at rest. Likely due to mucoid impaction of lower lobes as seen on CT. Will treat with continued montelukast, inhaled corticosteroid and LABA and add mucomyst therapy.  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 D/w Dr. Celester Colace. Will wait for his advice.  Anxiety Patient reports chronic anxiety as well as prior panic attacks.  Patient not suicidal or homicidal at this time.  Patient declines my offer to speak with psychiatry.  Patient does have several physiologic reasons to have anxiety including her COPD, mucoid impaction of lungs as well as tracheobronchomalacia as mentioned above.  I will order Atarax  as needed for her anxiety.  Patient is not taking her Prozac  at home.  Patient does use Xanax  at bedtime for sleep.  Which will be continued.  Prescription drug monitoring program report reviewed  Essential hypertension This is chronically documented, patient has had while variation in her blood pressure during her ER stay last night.  However current blood pressure is 99/51, rather soft.  Patient seems  asymptomatic.  However I will hold patient's antihypertensive regimen including her Imdur  and spironolactone .  I will also hold off on Lasix  for now.  Chronic heart failure with preserved ejection fraction (HFpEF) (HCC) Chronic. Lasix  held till BP improved and sodium improved. Curently felt to be slightly hypovolemic. However, anticipate restarting lasix  in next 24-48 hours with continud fluid restriction PO  GERD (gastroesophageal reflux disease) This is chronic, continue with pantoprazole   Afib (HCC) Continue with amiodarone , continue with apixaban .  Rate controlled.  Currently seems to be in sinus rhythm.  Hypothyroidism Check TSH, continue with Synthroid   RUQ abdominal tenderness See HPI and exam. This is incidental. Will check LFTs and proceed from there CT abd non actionable.  Restless leg syndrome Continue with Requip .  Dyslipidemia Continue with Lipitor  Chest pain Atypical. Resolved. S.p CT PE study and troponins which are negative. Clinical monitoring.   Please review med rec after pharmcy input.   Advance Care Planning:   Code Status: Full Code   Consults: pulmonary  Family Communication: per pateint. She declined my offer to call family.  Severity of Illness: The appropriate patient status for this patient is INPATIENT. Inpatient status is judged to be reasonable and necessary in order to provide the required intensity of service to ensure the patient's safety. The patient's presenting symptoms, physical exam findings, and initial radiographic and laboratory data in the context of their chronic comorbidities is felt to place them at high risk for further clinical deterioration. Furthermore, it is not anticipated that the patient will be medically stable for discharge from the hospital within 2 midnights of admission.   * I certify that at the point of admission it is my clinical judgment that the patient will require inpatient hospital care spanning beyond 2  midnights from the point of admission due to high intensity of service, high risk for further deterioration and high frequency of surveillance required.*  Author: Bennie Brave, MD 04/23/2024 8:49 AM  For on call review [email or web address].

## 2024-04-23 NOTE — Assessment & Plan Note (Signed)
 Continue with amiodarone , continue with apixaban .  Rate controlled.  Currently seems to be in sinus rhythm.

## 2024-04-23 NOTE — Assessment & Plan Note (Signed)
 Check TSH, continue with Synthroid 

## 2024-04-23 NOTE — Consult Note (Signed)
 PULMONOLOGY         Date: 04/23/2024,   MRN# [id number] Linda Brady [birth year]     AdmissionWeight: 80 kg                 CurrentWeight: 80 kg  Referring provider: Dr Bertell Broach   CHIEF COMPLAINT:   tracheobronchomalacia   HISTORY OF PRESENT ILLNESS   Linda Brady is a 83 y.o. 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 She came in with complains of chest discomfort.  Denies flu like illness,NVD.  This was associated with nausea and some sweating/diaphoresis per patient.  Patient also reports associated sensation of panic/anxiety.  With associated sensation of shortness of breath present at rest.  Patient denies any new cough or expectoration although she has had a mild cough last several days.  She denies any worsening of her chronic leg swelling.  Patient presented to the ER with above complaints. Endorses mild abd tenderness to palpation. CT chest was done which I reviewed personally with findings of tracheobronchomalacia but no infiltrate, mass or effusion. Patient is s/p CT chest abdomen pelvis, troponin testing which is not remarkable.  Patient symptoms have actually improved.  Patient has had prior attacks of panic per patient.  And her anxiety is related to living alone as per patient.  During part of the workup patient was ordered noted to have a sodium of 120 mEq/L.  Medical evaluation sought.PCCM consultation placed for abnormal CT chest with tracheobronchomalacia.    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 Lorie Rook and Dr. Honey Lusty   Thyroid   disease      SURGICAL HISTORY   Past Surgical History:  Procedure Laterality Date   BREAST CYST ASPIRATION Left 10 plus yrs   benign-twice   CATARACT EXTRACTION W/PHACO Left 03/20/2023   Procedure: CATARACT EXTRACTION PHACO AND INTRAOCULAR LENS PLACEMENT (IOC) LEFT VISION BLUE;  Surgeon: Trudi Fus, MD;  Location: [facility];  Service: Ophthalmology;  Laterality: Left;  21.02   01:38.2   INTRAOPERATIVE TRANSTHORACIC ECHOCARDIOGRAM N/A 12/16/2023   Procedure: INTRAOPERATIVE TRANSTHORACIC ECHOCARDIOGRAM;  Surgeon: Kyra Phy, MD;  Location: [facility];  Service: Cardiovascular;  Laterality: N/A;   LEFT HEART CATH AND CORONARY ANGIOGRAPHY N/A 09/11/2020   Procedure: LEFT HEART CATH AND CORONARY ANGIOGRAPHY;  Surgeon: Percival Brace, MD;  Location: [facility] INVASIVE CV [facility];  Service: Cardiovascular;  Laterality: N/A;   PERIPHERAL VASCULAR INTERVENTION Right 10/24/2023   Procedure: PERIPHERAL VASCULAR INTERVENTION;  Surgeon: Carlene Che, MD;  Location: [facility];  Service: Cardiovascular;  Laterality: Right;  right axillary stent   RIGHT/LEFT HEART CATH AND CORONARY ANGIOGRAPHY N/A 10/23/2023   Procedure: RIGHT/LEFT HEART CATH AND CORONARY ANGIOGRAPHY;  Surgeon: Kyra Phy, MD;  Location: [facility];  Service: Cardiovascular;  Laterality: N/A;   UPPER EXTREMITY ANGIOGRAPHY Right 10/24/2023   Procedure: Upper Extremity Angiography;  Surgeon: Carlene Che, MD;  Location[facility];  Service: Cardiovascular;  Laterality: Right;     FAMILY  HISTORY   Family History  Problem Relation Age of Onset   Breast cancer Neg Hx      SOCIAL HISTORY   Social History   Tobacco Use   Smoking status: Former    Current packs/day: 0.50    Average packs/day: 0.5 packs/day for 12.0 years (6.0 ttl pk-yrs)    Types: Cigarettes   Smokeless tobacco: Never   Tobacco comments:    7-8 cigarettes a day  Vaping Use   Vaping status:  Never Used  Substance Use Topics   Alcohol use: Not Currently   Drug use: Not Currently     MEDICATIONS    Home Medication:  Current Outpatient Rx   Order #: [id number] Class: Historical Med   Order #: [id number] Class: No Print   Order #: [id number] Class: Historical Med   Order #: [id number] Class: Normal   Order #: [id number] Class: Historical Med   Order #: [id number] Class: Normal   Order #: [id number] Class: Normal   Order #: [id number] Class: Historical Med   Order #: [id number] Class: Normal   Order #: [id number] Class: Historical Med   Order #: [id number] Class: Historical Med   Order #: [id number] Class: Normal   Order #: [id number] Class: Historical Med   Order #: [id number] Class: Normal   Order #: [id number] Class: Normal   Order #: [id number] Class: Normal   Order #: [id number] Class: Normal   Order #: [id number] Class: Normal   Order #: [id number] Class: Historical Med    Current Medication:  Current Facility-Administered Medications:    0.9 %  sodium chloride  infusion, , Intravenous, Continuous, Bennie Brave, MD, Last Rate: 75 mL/hr at 04/23/24 0548, New Bag at 04/23/24 0548   acetaminophen  (TYLENOL ) tablet 650 mg, 650 mg, Oral, Q6H PRN **OR** acetaminophen  (TYLENOL ) suppository 650 mg, 650 mg, Rectal, Q6H PRN, Bennie Brave, MD   acetylcysteine (MUCOMYST) 20 % nebulizer / oral solution 4 mL, 4 mL, Nebulization, TID, Goel, Hersh, MD   albuterol  (PROVENTIL ) (2.5 MG/3ML) 0.083% nebulizer solution 2.5 mg, 2.5 mg, Nebulization, TID, Goel, Hersh, MD   ALPRAZolam  (XANAX ) tablet 0.25 mg, 0.25 mg, Oral, QHS PRN, Goel, Hersh, MD   amiodarone  (PACERONE ) tablet 200 mg, 200 mg, Oral, Daily, Bennie Brave, MD   apixaban  (ELIQUIS ) tablet 5 mg, 5 mg, Oral, BID, Bennie Brave, MD   atorvastatin  (LIPITOR) tablet 40 mg, 40 mg, Oral, Daily, Bennie Brave, MD   fluticasone  furoate-vilanterol (BREO ELLIPTA ) 200-25 MCG/ACT 1 puff, 1 puff, Inhalation, Daily, Goel, Hersh, MD   hydrOXYzine  (ATARAX ) tablet 10 mg, 10 mg, Oral,  TID PRN, Bennie Brave, MD   ipratropium-albuterol  (DUONEB) 0.5-2.5 (3) MG/3ML nebulizer solution 3 mL, 3 mL, Inhalation, Q6H PRN, Goel, Hersh, MD   levothyroxine  (SYNTHROID ) tablet 75 mcg, 75 mcg, Oral, QAC breakfast, Goel, Hersh, MD   montelukast (SINGULAIR) tablet 10 mg, 10 mg, Oral, QHS, Goel, Hersh, MD   pantoprazole  (PROTONIX ) EC tablet 40 mg, 40 mg, Oral, BID, Goel, Hersh, MD   polyethylene glycol (MIRALAX / GLYCOLAX) packet 17 g, 17 g, Oral, Daily PRN, Goel, Hersh, MD   potassium chloride  (KLOR-CON ) packet 20 mEq, 20 mEq, Oral, Once, Bennie Brave, MD   Cecily Cohen ON 04/24/2024] rOPINIRole  (REQUIP ) tablet 0.25 mg, 0.25 mg, Oral, QHS, Bennie Brave, MD  Current Outpatient Medications:    ALPRAZolam  (XANAX ) 0.25 MG tablet, Take 0.25 mg by mouth at bedtime as needed for sleep., Disp: , Rfl:    amiodarone  (PACERONE ) 200 MG tablet, Take 1 tablet (200 mg total) by mouth daily., Disp: , Rfl:    apixaban  (ELIQUIS ) 5 MG TABS tablet, Take 5 mg  by mouth 2 (two) times daily., Disp: , Rfl:    atorvastatin  (LIPITOR) 40 MG tablet, Take 1 tablet (40 mg total) by mouth daily., Disp: 30 tablet, Rfl: 0   cholecalciferol  (VITAMIN D3) 25 MCG (1000 UNIT) tablet, Take 1,000 Units by mouth daily., Disp: , Rfl:    FLUoxetine  (PROZAC ) 10 MG capsule, Take 2 capsules (20 mg total) by mouth daily as needed (anxiety)., Disp: 30 capsule, Rfl: 0   furosemide  (LASIX ) 40 MG tablet, Take 1 tablet (40 mg total) by mouth daily., Disp: 90 tablet, Rfl: 3   Ipratropium-Albuterol  (COMBIVENT  RESPIMAT) 20-100 MCG/ACT AERS respimat, Inhale 1 puff into the lungs every 6 (six) hours as needed for wheezing., Disp: , Rfl:    isosorbide  mononitrate (IMDUR ) 30 MG 24 hr tablet, Take 1 tablet (30 mg total) by mouth daily., Disp: 30 tablet, Rfl: 1   levalbuterol  (XOPENEX ) 1.25 MG/3ML nebulizer solution, Take 1.25 mg by nebulization every 6 (six) hours as needed., Disp: , Rfl:    levothyroxine  (SYNTHROID ) 75 MCG tablet, Take 75 mcg by mouth daily  before breakfast., Disp: , Rfl:    mometasone -formoterol  (DULERA ) 200-5 MCG/ACT AERO, Inhale 2 puffs into the lungs 2 (two) times daily., Disp: 1 each, Rfl: 6   montelukast (SINGULAIR) 10 MG tablet, Take 1 tablet by mouth at bedtime., Disp: , Rfl:    pantoprazole  (PROTONIX ) 40 MG tablet, Take 1 tablet (40 mg total) by mouth 2 (two) times daily., Disp: 60 tablet, Rfl: 1   potassium chloride  SA (KLOR-CON  M) 20 MEQ tablet, Take 2 tablets (40 mEq total) by mouth daily., Disp: 180 tablet, Rfl: 3   rOPINIRole  (REQUIP ) 0.25 MG tablet, Take 1 tablet (0.25 mg total) by mouth at bedtime., Disp: 30 tablet, Rfl: 0   spironolactone  (ALDACTONE ) 25 MG tablet, Take 1 tablet (25 mg total) by mouth daily., Disp: 90 tablet, Rfl: 3   sucralfate  (CARAFATE ) 1 g tablet, Take 1 tablet (1 g total) by mouth 4 (four) times daily., Disp: 120 tablet, Rfl: 1   vitamin B-12 (CYANOCOBALAMIN ) 1000 MCG tablet, Take 1,000 mcg by mouth daily., Disp: , Rfl:     ALLERGIES   Naproxen sodium     REVIEW OF SYSTEMS    Review of Systems:  Gen:  Denies  fever, sweats, chills weigh loss  HEENT: Denies blurred vision, double vision, ear pain, eye pain, hearing loss, nose bleeds, sore throat Cardiac:  No dizziness, chest pain or heaviness, chest tightness,edema Resp:   reports dyspnea chronically  Gi: Denies swallowing difficulty, stomach pain, nausea or vomiting, diarrhea, constipation, bowel incontinence Gu:  Denies bladder incontinence, burning urine Ext:   Denies Joint pain, stiffness or swelling Skin: Denies  skin rash, easy bruising or bleeding or hives Endoc:  Denies polyuria, polydipsia , polyphagia or weight change Psych:   Denies depression, insomnia or hallucinations   Other:  All other systems negative   VS: BP (!) 99/51   Pulse 65   Temp 98.6 F (37 C) (Oral)   Resp 19   Ht 5\' 3"  (1.6 m)   Wt 80 kg   SpO2 100%   BMI 31.24 kg/m      PHYSICAL EXAM    GENERAL:NAD, no fevers, chills, no weakness no  fatigue HEAD: Normocephalic, atraumatic.  EYES: Pupils equal, round, reactive to light. Extraocular muscles intact. No scleral icterus.  MOUTH: Moist mucosal membrane. Dentition intact. No abscess noted.  EAR, NOSE, THROAT: Clear without exudates. No external lesions.  NECK: Supple. No thyromegaly. No nodules.  No JVD.  PULMONARY: decreased breath sounds with mild rhonchi worse at bases bilaterally.  CARDIOVASCULAR: S1 and S2. Regular rate and rhythm. No murmurs, rubs, or gallops. No edema. Pedal pulses 2+ bilaterally.  GASTROINTESTINAL: Soft, nontender, nondistended. No masses. Positive bowel sounds. No hepatosplenomegaly.  MUSCULOSKELETAL: No swelling, clubbing, or edema. Range of motion full in all extremities.  NEUROLOGIC: Cranial nerves II through XII are intact. No gross focal neurological deficits. Sensation intact. Reflexes intact.  SKIN: No ulceration, lesions, rashes, or cyanosis. Skin warm and dry. Turgor intact.  PSYCHIATRIC: Mood, affect within normal limits. The patient is awake, alert and oriented x 3. Insight, judgment intact.       IMAGING   Narrative & Impression  CLINICAL DATA:  Shortness of breath and right lower quadrant pain   EXAM: CT ANGIOGRAPHY CHEST   CT ABDOMEN AND PELVIS WITH CONTRAST   TECHNIQUE: Multidetector CT imaging of the chest was performed using the standard protocol during bolus administration of intravenous contrast. Multiplanar CT image reconstructions and MIPs were obtained to evaluate the vascular anatomy. Multidetector CT imaging of the abdomen and pelvis was performed using the standard protocol during bolus administration of intravenous contrast.   RADIATION DOSE REDUCTION: This exam was performed according to the departmental dose-optimization program which includes automated exposure control, adjustment of the mA and/or kV according to patient size and/or use of iterative reconstruction technique.   CONTRAST:  OMNIPAQUE   IOHEXOL  350 MG/ML SOLN   COMPARISON:  Chest CTA 10/28/2023   FINDINGS: CTA CHEST FINDINGS   Cardiovascular: Satisfactory opacification of the pulmonary arteries to the segmental level. No evidence of pulmonary embolism. Enlarged heart with thickened left ventricle. Unchanged Transcatheter aortic valve replacement. Extensive atheromatous calcification of the aorta and great vessels.   Mediastinum/Nodes: Negative for mass or adenopathy. Small hiatal hernia with no esophageal thickening.   Lungs/Pleura: Generalized airway thickening with intermittent tracheal and bronchial narrowing compatible with tracheal bronchomalacia. Bilateral lower lobe bronchi are collapsed and intermittently opacified with mild hazy density and volume loss at the lung bases where there is streaky scarring. There is no edema, acute airspace disease, effusion, or pneumothorax.   Musculoskeletal: No acute or aggressive finding   Review of the MIP images confirms the above findings.   CT ABDOMEN and PELVIS FINDINGS   Hepatobiliary: No focal liver abnormality.Cholecystectomy. No biliary dilatation   Pancreas: Unremarkable.   Spleen: Unremarkable.   Adrenals/Urinary Tract: Negative adrenals. No hydronephrosis or stone. Bilateral renal cystic densities. No worrisome renal lesion when accounting for areas of motion artifact and cyst distortion. Unremarkable bladder.   Stomach/Bowel:  No obstruction. No visible bowel inflammation.   Vascular/Lymphatic: No acute vascular abnormality. Extensive atheromatous calcification. No mass or adenopathy.   Reproductive:Hysterectomy.   Other: No ascites or pneumoperitoneum.   Musculoskeletal: No acute abnormalities. 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   Review of the MIP images confirms the above findings.   IMPRESSION: No acute finding in the chest or abdomen.   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 Chronic  scarring and atelectasis in the lower lungs.   Atherosclerosis.     Electronically Signed   By: Ronnette Coke M.D.   On: 04/23/2024 05:12      ASSESSMENT/PLAN   Advanced tracheobronchomalacia    - supportive care for now with nebulizer therapy     - Duoneb q6h PRN    - PT/OT   COPD with chronic hypoxemia  -continue Breo ellipta  daily    -  continue supplemental O2 with spO2 goal 88-92%     Mucus plugging of bronchi  Mucomyst  with nebulizer therapy    Atelectasis at bases bilaterally    Incentive spirometry multiple times each hour  Chest physiotherapy - flutter valve and PT/OT     Thank you for allowing me to participate in the care of this patient.   Patient/Family are satisfied with care plan and all questions have been answered.    Provider disclosure: Patient with at least one acute or chronic illness or injury that poses a threat to life or bodily function and is being managed actively during this encounter.  All of the below services have been performed independently by signing provider:  review of prior documentation from internal and or external health records.  Review of previous and current lab results.  Interview and comprehensive assessment during patient visit today. Review of current and previous chest radiographs/CT scans. Discussion of management and test interpretation with health care team and patient/family.   This document was prepared using Dragon voice recognition software and may include unintentional dictation errors.     Aidee Latimore, M.D.  Division of Pulmonary & Critical Care Medicine

## 2024-04-23 NOTE — Assessment & Plan Note (Signed)
 Patient uses Lasix  chronically for leg edema.  Patient clinically currently appears euvolemic.  However noted to have sodium of 120.  With reduced urinary sodium suggestive of prerenal hyponatremia.  Patient has already been started on sodium chloride  infusion.  I anticipate that her sodium will slowly improve with above treatment.  Patient hyponatremia is moderate chronic and asymptomatic.  I will maintain the patient on regular diet however with fluid restriction to prevent patient from developing hyponatremia again while she gets her Lasix  therapy.

## 2024-04-23 NOTE — Care Management Obs Status (Signed)
 MEDICARE OBSERVATION STATUS NOTIFICATION   Patient Details  Name: AYLEEN MCKINSTRY MRN: [id number] Date of Birth: 08/03/1941   Medicare Observation Status Notification Given:  Yes    Nellene Banana Galileah Piggee, RN 04/23/2024, 11:20 AM

## 2024-04-23 NOTE — ED Notes (Signed)
 Pt ambulated to the bed without assistance. Pt is A&O at this time. Remains as a high fall risk due to monitor cords, O2 connection, and Iv fluids running. Fall bundle not in place upon this RN assessment. Bed alarm activated. Wrist band already applied from night shift. And socks placed on pt.

## 2024-04-23 NOTE — Assessment & Plan Note (Signed)
 Atypical. Resolved. S.p CT PE study and troponins which are negative. Clinical monitoring.

## 2024-04-23 NOTE — ED Notes (Signed)
Pt ambulatory to the bathroom at this time. Gait steady.

## 2024-04-23 NOTE — Assessment & Plan Note (Signed)
 Patient reports chronic anxiety as well as prior panic attacks.  Patient not suicidal or homicidal at this time.  Patient declines my offer to speak with psychiatry.  Patient does have several physiologic reasons to have anxiety including her COPD, mucoid impaction of lungs as well as tracheobronchomalacia as mentioned above.  I will order Atarax  as needed for her anxiety.  Patient is not taking her Prozac  at home.  Patient does use Xanax  at bedtime for sleep.  Which will be continued.  Prescription drug monitoring program report reviewed

## 2024-04-23 NOTE — ED Provider Notes (Signed)
 [facility] Provider Note    Event Date/Time   First MD Initiated Contact with Patient 04/23/24 0225     (approximate)   History   Shortness of Breath   HPI  Linda Brady is a 83 y.o. 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  Patient reports having intermittent chest pain that she is unable to describe further and that is in the center of her chest without radiation, shortness of breath, nausea, diaphoresis for the past day.  No current chest pain but is having some shortness of breath.  No fevers or cough.  No calf tenderness or calf swelling.  Denies history of PE or DVT.  Reports she is on diuretics and has been fluid restricting per her cardiologist recommendations.  Cardiology just increased her Lasix  dose a month ago.  Patient also reports having intermittent right-sided abdominal pain that involves the right upper and lower quadrants.  Reports she has had a previous cholecystectomy but thinks she still has her appendix.  Patient has a hard time telling me how long this has been present.  She denies any nausea, vomiting, diarrhea, dysuria or hematuria, vaginal bleeding or discharge.  Patient also complaining of feeling anxious and having restless legs.  She states these are chronic issues for her.  She is on Xanax  and Requip .   History provided by patient.    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 Lorie Rook  and Dr. Honey Lusty   Thyroid  disease     Past Surgical History:  Procedure Laterality Date   BREAST CYST ASPIRATION Left 10 plus yrs   benign-twice   CATARACT EXTRACTION W/PHACO Left 03/20/2023   Procedure: CATARACT EXTRACTION PHACO AND INTRAOCULAR LENS PLACEMENT (IOC) LEFT VISION BLUE;  Surgeon: Trudi Fus, MD;  Location: [facility];  Service: Ophthalmology;  Laterality: Left;  21.02   01:38.2   INTRAOPERATIVE TRANSTHORACIC ECHOCARDIOGRAM N/A 12/16/2023   Procedure: INTRAOPERATIVE TRANSTHORACIC ECHOCARDIOGRAM;  Surgeon: Kyra Phy, MD;  Location: [facility] INVASIVE CV LAB;  Service: Cardiovascular;  Laterality: N/A;   LEFT HEART CATH AND CORONARY ANGIOGRAPHY N/A 09/11/2020   Procedure: LEFT HEART CATH AND CORONARY ANGIOGRAPHY;  Surgeon: Percival Brace, MD;  Location: [facility] INVASIVE CV LAB;  Service: Cardiovascular;  Laterality: N/A;   PERIPHERAL VASCULAR INTERVENTION Right 10/24/2023   Procedure: PERIPHERAL VASCULAR INTERVENTION;  Surgeon: Carlene Che, MD;  Location: [facility] INVASIVE CV LAB;  Service: Cardiovascular;  Laterality: Right;  right axillary stent   RIGHT/LEFT HEART CATH AND CORONARY ANGIOGRAPHY N/A 10/23/2023   Procedure: RIGHT/LEFT HEART CATH AND CORONARY ANGIOGRAPHY;  Surgeon: Kyra Phy, MD;  Location: [facility] INVASIVE CV LAB;  Service: Cardiovascular;  Laterality: N/A;   UPPER EXTREMITY ANGIOGRAPHY Right 10/24/2023   Procedure: Upper Extremity Angiography;  Surgeon: Carlene Che, MD;  Location: [facility] INVASIVE CV LAB;  Service: Cardiovascular;  Laterality: Right;    MEDICATIONS:  Prior to Admission medications   Medication Sig Start Date End Date Taking? Authorizing Provider  ALPRAZolam  (XANAX ) 0.25 MG tablet Take 0.25 mg by mouth at bedtime as needed for sleep.    [provider]  amiodarone  (PACERONE ) 200 MG tablet Take 1 tablet (200 mg total) by mouth daily. 01/16/24   Ardia Kraft, PA-C  apixaban  (ELIQUIS ) 5 MG TABS tablet Take 5  mg by mouth 2 (two) times daily.    [provider]  atorvastatin  (LIPITOR) 40 MG tablet Take 1 tablet (40 mg total) by mouth daily. 09/20/23   Alexander, Natalie, DO  cholecalciferol  (VITAMIN D3) 25 MCG (1000 UNIT) tablet Take 1,000 Units by mouth daily.    [provider]  FLUoxetine  (PROZAC ) 10 MG capsule Take 2 capsules (20 mg total) by mouth daily as needed (anxiety). 11/17/23 03/31/24  Jacquie Maudlin, MD  furosemide  (LASIX ) 40 MG tablet Take 1 tablet (40 mg total) by mouth daily. 03/31/24 06/29/24  Constancia Delton, MD  Ipratropium-Albuterol  (COMBIVENT  RESPIMAT) 20-100 MCG/ACT AERS respimat Inhale 1 puff into the lungs every 6 (six) hours as needed for wheezing.    [provider]  isosorbide  mononitrate (IMDUR ) 30 MG 24 hr tablet Take 1 tablet (30 mg total) by mouth daily. 10/14/22   Furth, Cadence H, PA-C  levalbuterol  (XOPENEX ) 1.25 MG/3ML nebulizer solution Take 1.25 mg by nebulization every 6 (six) hours as needed. 12/08/23 12/07/24  [provider]  levothyroxine  (SYNTHROID ) 75 MCG tablet Take 75 mcg by mouth daily before breakfast. 05/02/20   [provider]  mometasone -formoterol  (DULERA ) 200-5 MCG/ACT AERO Inhale 2 puffs into the lungs 2 (two) times daily. 12/17/23   Ardia Kraft, PA-C  montelukast (SINGULAIR) 10 MG tablet Take 1 tablet by mouth at bedtime. 02/03/24 02/02/25  [provider]  pantoprazole  (PROTONIX ) 40 MG tablet Take 1 tablet (40 mg total) by mouth 2 (two) times daily. 11/17/23 03/31/24  Jacquie Maudlin, MD  potassium chloride  SA (KLOR-CON  M) 20 MEQ tablet Take 2 tablets (40 mEq total) by mouth daily. 04/13/24   Constancia Delton, MD  rOPINIRole  (REQUIP ) 0.25 MG tablet Take 1 tablet (0.25 mg total) by mouth at bedtime. 10/28/23 03/31/24  Magdalene School, MD  spironolactone  (ALDACTONE ) 25 MG tablet Take 1 tablet (25 mg total) by mouth daily. 12/26/23 03/31/24  Ardia Kraft, PA-C  sucralfate  (CARAFATE ) 1 g tablet  Take 1 tablet (1 g total) by mouth 4 (four) times daily. 11/17/23   Jacquie Maudlin, MD  vitamin B-12 (CYANOCOBALAMIN ) 1000 MCG tablet Take 1,000 mcg by mouth daily.    [provider]    Physical Exam   Triage Vital Signs: ED Triage Vitals  Encounter Vitals Group     BP 04/22/24 2214 (!) 156/79     Systolic BP Percentile --      Diastolic BP Percentile --      Pulse Rate 04/22/24 2214 72     Resp 04/22/24 2214 20     Temp 04/22/24 2214 98 F (36.7 C)     Temp Source 04/22/24 2214 Oral     SpO2 04/22/24 2207 99 %     Weight 04/22/24 2215 176 lb 5.9 oz (80 kg)     Height 04/22/24 2215 5\' 3"  (1.6 m)     Head Circumference --      Peak Flow --      Pain Score 04/22/24 2214 3     Pain Loc --      Pain Education --      Exclude from Growth Chart --     Most recent vital  signs: Vitals:   04/23/24 0549 04/23/24 0600  BP:  116/63  Pulse:  79  Resp:  20  Temp: 98.6 F (37 C)   SpO2:  100%    CONSTITUTIONAL: Alert, responds appropriately to questions.  Elderly, anxious HEAD: Normocephalic, atraumatic EYES: Conjunctivae clear, pupils appear equal, sclera nonicteric ENT: normal nose; moist mucous membranes NECK: Supple, normal ROM CARD: RRR; S1 and S2 appreciated RESP: Normal chest excursion without splinting or tachypnea; breath sounds clear and equal bilaterally; no wheezes, no rhonchi, no rales, no hypoxia or respiratory distress, speaking full sentences ABD/GI: Non-distended; soft, non-tender, no rebound, no guarding, no peritoneal signs BACK: The back appears normal EXT: Normal ROM in all joints; no deformity noted, no edema, no calf tenderness or calf swelling, extremities warm well-perfused SKIN: Normal color for age and race; warm; no rash on exposed skin NEURO: Moves all extremities equally, normal speech PSYCH: The patient's mood and manner are appropriate.   ED Results / Procedures / Treatments   LABS: (all labs ordered are listed, but only abnormal  results are displayed) Labs Reviewed  BASIC METABOLIC PANEL WITH GFR - Abnormal; Notable for the following components:      Result Value   Sodium 120 (*)    Potassium 3.4 (*)    Chloride 85 (*)    Glucose, Bld 121 (*)    All other components within normal limits  CBC - Abnormal; Notable for the following components:   RBC 3.74 (*)    Hemoglobin 9.8 (*)    HCT 29.7 (*)    MCV 79.4 (*)    RDW 17.4 (*)    Platelets 438 (*)    All other components within normal limits  PROTIME-INR - Abnormal; Notable for the following components:   Prothrombin Time 15.4 (*)    All other components within normal limits  URINALYSIS, ROUTINE W REFLEX MICROSCOPIC - Abnormal; Notable for the following components:   Color, Urine STRAW (*)    APPearance CLEAR (*)    Hgb urine dipstick SMALL (*)    All other components within normal limits  MAGNESIUM   HEPATIC FUNCTION PANEL  LIPASE, BLOOD  TROPONIN I (HIGH SENSITIVITY)  TROPONIN I (HIGH SENSITIVITY)     EKG:   Date: 04/22/2024 22:17  Rate: 71  Rhythm: normal sinus rhythm  QRS Axis: normal  Intervals: First-degree AV block  ST/T Wave abnormalities: normal  Conduction Disutrbances: none  Narrative Interpretation: First-degree AV block, no significant change compared to prior.  Artifact present     RADIOLOGY: My personal review and interpretation of imaging: Chest x-ray clear.  CTA of the chest shows no PE.  CT of the abdomen pelvis unremarkable.  I have personally reviewed all radiology reports.   CT Angio Chest PE W and/or Wo Contrast Result Date: 04/23/2024 CLINICAL DATA:  Shortness of breath and right lower quadrant pain EXAM: CT ANGIOGRAPHY CHEST CT ABDOMEN AND PELVIS WITH CONTRAST TECHNIQUE: Multidetector CT imaging of the chest was performed using the standard protocol during bolus administration of intravenous contrast. Multiplanar CT image reconstructions and MIPs were obtained to evaluate the vascular anatomy. Multidetector CT imaging  of the abdomen and pelvis was performed using the standard protocol during bolus administration of intravenous contrast. RADIATION DOSE REDUCTION: This exam was performed according to the departmental dose-optimization program which includes automated exposure control, adjustment of the mA and/or kV according to patient size and/or use of iterative reconstruction technique. CONTRAST:  OMNIPAQUE  IOHEXOL  350 MG/ML SOLN COMPARISON:  Chest CTA  10/28/2023 FINDINGS: CTA CHEST FINDINGS Cardiovascular: Satisfactory opacification of the pulmonary arteries to the segmental level. No evidence of pulmonary embolism. Enlarged heart with thickened left ventricle. Unchanged Transcatheter aortic valve replacement. Extensive atheromatous calcification of the aorta and great vessels. Mediastinum/Nodes: Negative for mass or adenopathy. Small hiatal hernia with no esophageal thickening. Lungs/Pleura: Generalized airway thickening with intermittent tracheal and bronchial narrowing compatible with tracheal bronchomalacia. Bilateral lower lobe bronchi are collapsed and intermittently opacified with mild hazy density and volume loss at the lung bases where there is streaky scarring. There is no edema, acute airspace disease, effusion, or pneumothorax. Musculoskeletal: No acute or aggressive finding Review of the MIP images confirms the above findings. CT ABDOMEN and PELVIS FINDINGS Hepatobiliary: No focal liver abnormality.Cholecystectomy. No biliary dilatation Pancreas: Unremarkable. Spleen: Unremarkable. Adrenals/Urinary Tract: Negative adrenals. No hydronephrosis or stone. Bilateral renal cystic densities. No worrisome renal lesion when accounting for areas of motion artifact and cyst distortion. Unremarkable bladder. Stomach/Bowel:  No obstruction. No visible bowel inflammation. Vascular/Lymphatic: No acute vascular abnormality. Extensive atheromatous calcification. No mass or adenopathy. Reproductive:Hysterectomy. Other: No  ascites or pneumoperitoneum. Musculoskeletal: No acute abnormalities. 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 Review of the MIP images confirms the above findings. IMPRESSION: No acute finding in the chest or abdomen. 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 Chronic scarring and atelectasis in the lower lungs. Atherosclerosis. Electronically Signed   By: Ronnette Coke M.D.   On: 04/23/2024 05:12   CT ABDOMEN PELVIS W CONTRAST Result Date: 04/23/2024 CLINICAL DATA:  Shortness of breath and right lower quadrant pain EXAM: CT ANGIOGRAPHY CHEST CT ABDOMEN AND PELVIS WITH CONTRAST TECHNIQUE: Multidetector CT imaging of the chest was performed using the standard protocol during bolus administration of intravenous contrast. Multiplanar CT image reconstructions and MIPs were obtained to evaluate the vascular anatomy. Multidetector CT imaging of the abdomen and pelvis was performed using the standard protocol during bolus administration of intravenous contrast. RADIATION DOSE REDUCTION: This exam was performed according to the departmental dose-optimization program which includes automated exposure control, adjustment of the mA and/or kV according to patient size and/or use of iterative reconstruction technique. CONTRAST:  OMNIPAQUE  IOHEXOL  350 MG/ML SOLN COMPARISON:  Chest CTA 10/28/2023 FINDINGS: CTA CHEST FINDINGS Cardiovascular: Satisfactory opacification of the pulmonary arteries to the segmental level. No evidence of pulmonary embolism. Enlarged heart with thickened left ventricle. Unchanged Transcatheter aortic valve replacement. Extensive atheromatous calcification of the aorta and great vessels. Mediastinum/Nodes: Negative for mass or adenopathy. Small hiatal hernia with no esophageal thickening. Lungs/Pleura: Generalized airway thickening with intermittent tracheal and bronchial narrowing compatible with tracheal bronchomalacia. Bilateral lower lobe bronchi are  collapsed and intermittently opacified with mild hazy density and volume loss at the lung bases where there is streaky scarring. There is no edema, acute airspace disease, effusion, or pneumothorax. Musculoskeletal: No acute or aggressive finding Review of the MIP images confirms the above findings. CT ABDOMEN and PELVIS FINDINGS Hepatobiliary: No focal liver abnormality.Cholecystectomy. No biliary dilatation Pancreas: Unremarkable. Spleen: Unremarkable. Adrenals/Urinary Tract: Negative adrenals. No hydronephrosis or stone. Bilateral renal cystic densities. No worrisome renal lesion when accounting for areas of motion artifact and cyst distortion. Unremarkable bladder. Stomach/Bowel:  No obstruction. No visible bowel inflammation. Vascular/Lymphatic: No acute vascular abnormality. Extensive atheromatous calcification. No mass or adenopathy. Reproductive:Hysterectomy. Other: No ascites or pneumoperitoneum. Musculoskeletal: No acute abnormalities. 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 Review of the MIP images confirms the above findings. IMPRESSION: No acute finding in the chest or abdomen. 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 Chronic scarring and atelectasis in the lower lungs. Atherosclerosis. Electronically Signed   By: Ronnette Coke M.D.   On: 04/23/2024 05:12   DG Chest Port 1 View Result Date: 04/22/2024 CLINICAL DATA:  Chest pain and shortness of breath EXAM: PORTABLE CHEST 1 VIEW COMPARISON:  03/27/2024 FINDINGS: Cardiac shadow is stable. Prior TAVR is seen. Aortic calcifications noted. Mild vascular congestion is noted. No focal infiltrate or effusion is seen. No bony abnormality is noted. Mild scarring is noted in the bases. IMPRESSION: Chronic changes. Mild vascular congestion. Electronically Signed   By: Violeta Grey M.D.   On: 04/22/2024 23:04     PROCEDURES:  Critical Care performed: Yes, see critical care procedure note(s)   CRITICAL  CARE Performed by: Starling Eck Griselle Rufer   Total critical care time: 30 minutes  Critical care time was exclusive of separately billable procedures and treating other patients.  Critical care was necessary to treat or prevent imminent or life-threatening deterioration.  Critical care was time spent personally by me on the following activities: development of treatment plan with patient and/or surrogate as well as nursing, discussions with consultants, evaluation of patient's response to treatment, examination of patient, obtaining history from patient or surrogate, ordering and performing treatments and interventions, ordering and review of laboratory studies, ordering and review of radiographic studies, pulse oximetry and re-evaluation of patient's condition.   Aaron Aas1-3 Lead EKG Interpretation  Performed by: Leisha Trinkle, Clover Dao, DO Authorized by: Lasheika Ortloff, Clover Dao, DO     Interpretation: normal     ECG rate:  79   ECG rate assessment: normal     Rhythm: sinus rhythm     Ectopy: none     Conduction: normal       IMPRESSION / MDM / ASSESSMENT AND PLAN / ED COURSE  I reviewed the triage vital signs and the nursing notes.    Patient here with complaints of chest pain, shortness of breath.  Also complaining of right side abdominal pain.  She is also complaining of anxiety and restless leg.  The patient is on the cardiac monitor to evaluate for evidence of arrhythmia and/or significant heart rate changes.   DIFFERENTIAL DIAGNOSIS (includes but not limited to):   ACS, PE, CHF exacerbation, COPD exacerbation, anxiety, doubt dissection, pneumonia, pneumothorax.  Differential for her abdominal pain includes appendicitis, colitis, diverticulitis, bowel obstruction, UTI, kidney stone, pyelonephritis.   Patient's presentation is most consistent with acute presentation with potential threat to life or bodily function.   PLAN: Workup initiated from triage.  Patient has a sodium of 120.  Likely from  diuresis, fluid restriction.  Will give gentle IV hydration here.  Potassium of 3.4.  Will check magnesium  level.  Troponin x 1 is negative.  Repeat pending.  Chest x-ray reviewed and interpreted by myself and the radiologist and is unremarkable.  Will obtain CT imaging of the chest, abdomen pelvis to rule out PE and other pathology within the abdomen for causes of her chest pain and abdominal pain.  Will give Ativan  for anxiety here.   MEDICATIONS GIVEN IN ED: Medications  0.9 %  sodium chloride  infusion ( Intravenous New Bag/Given 04/23/24 0548)  sodium chloride  0.9 % bolus 500 mL (0 mLs Intravenous Stopped 04/23/24 0315)  aspirin  chewable tablet 324 mg (324 mg Oral Given 04/23/24 0329)  LORazepam  (ATIVAN ) tablet 1 mg (1 mg Oral Given 04/23/24 0347)  iohexol  (OMNIPAQUE ) 350 MG/ML injection 100 mL (100 mLs Intravenous Contrast Given 04/23/24  0411)  iohexol  (OMNIPAQUE ) 350 MG/ML injection 100 mL (100 mLs Intravenous Contrast Given 04/23/24 0411)  rOPINIRole  (REQUIP ) tablet 0.25 mg (0.25 mg Oral Given 04/23/24 0538)     ED COURSE: Patient's second troponin is negative.  CT is reviewed and interpreted by myself and the radiologist and show no acute abnormality.  The patient does have advanced tracheobronchial malacia and lower lobe mucoid impaction with chronic scarring and atelectasis in the lower lungs.  She has diminished aeration on exam but no wheezing, increased oxygen requirement.  She does wear 2 L chronically.  Initial EKG showed significant artifact but repeat shows no ischemia.  CT of her abdomen pelvis shows no acute abnormality.  Urine shows no sign of infection.  Will add on LFTs, lipase.  Will discuss with hospitalist for admission for hyponatremia.   CONSULTS:  Consulted and discussed patient's case with hospitalist, Dr. Vallarie Gauze.  I have recommended admission and consulting physician agrees and will place admission orders.  Patient (and family if present) agree with this plan.   I  reviewed all nursing notes, vitals, pertinent previous records.  All labs, EKGs, imaging ordered have been independently reviewed and interpreted by myself.    OUTSIDE RECORDS REVIEWED: Reviewed last cardiology note on 03/31/2024.  Patient had her Lasix  increased to 40 mg daily at that time.  She is also on Aldactone  25 mg daily.       FINAL CLINICAL IMPRESSION(S) / ED DIAGNOSES   Final diagnoses:  Hyponatremia  Chest pain, unspecified type  Right sided abdominal pain     Rx / DC Orders   ED Discharge Orders     None        Note:  This document was prepared using Dragon voice recognition software and may include unintentional dictation errors.   Janayah Zavada, Clover Dao, DO 04/23/24 [id number]

## 2024-04-23 NOTE — Assessment & Plan Note (Signed)
 This is chronically documented, patient has had while variation in her blood pressure during her ER stay last night.  However current blood pressure is 99/51, rather soft.  Patient seems asymptomatic.  However I will hold patient's antihypertensive regimen including her Imdur  and spironolactone .  I will also hold off on Lasix  for now.

## 2024-04-23 NOTE — Assessment & Plan Note (Signed)
 Chronic. Lasix  held till BP improved and sodium improved. Curently felt to be slightly hypovolemic. However, anticipate restarting lasix  in next 24-48 hours with continud fluid restriction PO

## 2024-04-23 NOTE — Assessment & Plan Note (Signed)
 Continue with Requip.

## 2024-04-23 NOTE — ED Notes (Signed)
 RN in to do meds that have been verified by pharm. Pt eating at this time. Requesting for RN to come back after she is done.

## 2024-04-24 DIAGNOSIS — E871 Hypo-osmolality and hyponatremia: Secondary | ICD-10-CM | POA: Diagnosis not present

## 2024-04-24 LAB — BASIC METABOLIC PANEL WITH GFR
Anion gap: 8 (ref 5–15)
BUN: 10 mg/dL (ref 8–23)
CO2: 26 mmol/L (ref 22–32)
Calcium: 9 mg/dL (ref 8.9–10.3)
Chloride: 98 mmol/L (ref 98–111)
Creatinine, Ser: 0.89 mg/dL (ref 0.44–1.00)
GFR, Estimated: >60 mL/min (ref >60)
Glucose, Bld: 84 mg/dL (ref 70–99)
Potassium: 4 mmol/L (ref 3.5–5.1)
Sodium: 132 mmol/L — ABNORMAL LOW (ref 135–145)

## 2024-04-24 LAB — CBC
HCT: 29.9 % — ABNORMAL LOW (ref 36.0–46.0)
Hemoglobin: 9.4 g/dL — ABNORMAL LOW (ref 12.0–15.0)
MCH: 26.2 pg (ref 26.0–34.0)
MCHC: 31.4 g/dL (ref 30.0–36.0)
MCV: 83.3 fL (ref 80.0–100.0)
Platelets: 451 10*3/uL — ABNORMAL HIGH (ref 150–400)
RBC: 3.59 MIL/uL — ABNORMAL LOW (ref 3.87–5.11)
RDW: 17.5 % — ABNORMAL HIGH (ref 11.5–15.5)
WBC: 5.5 10*3/uL (ref 4.0–10.5)
nRBC: 0 % (ref 0.0–0.2)

## 2024-04-24 LAB — PROTIME-INR
INR: 1.3 — ABNORMAL HIGH (ref 0.8–1.2)
Prothrombin Time: 16 s — ABNORMAL HIGH (ref 11.4–15.2)

## 2024-04-24 LAB — APTT: aPTT: 30 s (ref 24–36)

## 2024-04-24 MED ORDER — FUROSEMIDE 20 MG PO TABS: 20.0000 mg | ORAL_TABLET | Freq: Every day | ORAL | Status: DC

## 2024-04-24 NOTE — Discharge Summary (Signed)
 [name]:[id number] DOB: [birth year] DOA: 04/23/2024  PCP: Lyle San, MD  Admit date: 04/23/2024 Discharge date: 04/24/2024  Time spent: 35 minutes  Recommendations for Outpatient Follow-up:  Pcp or cardiology f/u 1 week, check sodium then, assess volume status     Discharge Diagnoses:  Principal Problem:   Hyponatremia Active Problems:   COPD (chronic obstructive pulmonary disease) (HCC)   Anxiety   Essential hypertension   Chronic heart failure with preserved ejection fraction (HFpEF) (HCC)   Afib (HCC)   Hypothyroidism   Chest pain   Restless leg syndrome   S/P TAVR (transcatheter aortic valve replacement)   RUQ abdominal tenderness   Discharge Condition: stable  Diet recommendation: heart healthy  Filed Weights   04/22/24 2215  Weight: 80 kg    History of present illness:  From admission h and p Linda Brady is a 83 y.o. 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  Patient has diagnosis of COPD and chronically uses 2 L/min of supplementary oxygen.  Patient does not have any shortness of breath at rest.   Patient seems to be in her usual state of health until approximately May 22, yesterday earlier in the afternoon when she reports sharp anterior chest pain on and off present at rest without radiation exacerbating or relieving factor.  This was associated with nausea and some sweating/diaphoresis per patient.  This was not associated with any fever.  Patient felt flushed.  Patient also reports associated sensation of panic/anxiety.  With associated sensation of shortness of breath present at rest.  Patient denies any new cough or expectoration although she has had a mild cough last several days.  She denies any worsening of her chronic leg swelling.  Patient  presented to the ER with above complaints.   During evaluation in the ER patient was noted to have also right upper quadrant tenderness.  However patient denies having had any pain there.   Patient is s/p CT chest abdomen pelvis, troponin testing which is not remarkable.  Patient symptoms have actually improved.  Patient has had prior attacks of panic per patient.  And her anxiety is related to living alone as per patient.  During part of the workup patient was ordered noted to have a sodium of 120 mEq/L.  Medical evaluation sought.   Again patient denies any vomiting or diarrhea or any marked reduced in her p.o. intake.  Patient does take Lasix  at home. Currently asymptomatic excpet for sensation of mild sob while at rest.  Hospital Course:  Patient presents with chest pain that has resolved. Ischemic w/u negative, CTA of chest negative for PE or other acute process. Labs revealed hyponatremia, sodium of 120, and patient was admitted for for further evaluation and treatment of hyponatremia. About 3 weeks prior patient's lasix  was increased from 20 to 40 and spironolactone  was added. Here those drugs were held and patient was given IV fluids, and sodium improved to 132 which is near her baseline. Suspect then that this hyponatremia is 2/2 over-diuresis. Advise reducing lasix  to prior dose of 20 mg daily and holding spironolactone , with close pcp or cardiology f/u to re-check sodium level and assess volume status. Daughter reports patient had also recently been instructed to limit her fluid intake, so this may have contributed to volume depletion.   Procedures: none    Discharge Exam: Vitals:  04/24/24 0413 04/24/24 0751  BP: (!) 145/70 (!) 154/62  Pulse: 72 65  Resp: 20 20  Temp: 98 F (36.7 C) (!) 97.5 F (36.4 C)  SpO2: 90% 100%    General: NAD Cardiovascular: RRR Respiratory: scattered rhonchi, normal wob  Discharge Instructions   Discharge Instructions     Diet - low sodium  heart healthy   Complete by: As directed    Increase activity slowly   Complete by: As directed       Allergies as of 04/24/2024       Reactions   Naproxen Sodium Anaphylaxis        Medication List     STOP taking these medications    spironolactone  25 MG tablet Commonly known as: ALDACTONE        TAKE these medications    ALPRAZolam  0.25 MG tablet Commonly known as: XANAX  Take 0.25 mg by mouth at bedtime as needed for sleep.   amiodarone  200 MG tablet Commonly known as: PACERONE  Take 1 tablet (200 mg total) by mouth daily.   atorvastatin  40 MG tablet Commonly known as: LIPITOR Take 1 tablet (40 mg total) by mouth daily.   cholecalciferol  25 MCG (1000 UNIT) tablet Commonly known as: VITAMIN D3 Take 1,000 Units by mouth daily.   Combivent  Respimat 20-100 MCG/ACT Aers respimat Generic drug: Ipratropium-Albuterol  Inhale 1 puff into the lungs every 6 (six) hours as needed for wheezing.   cyanocobalamin  1000 MCG tablet Commonly known as: VITAMIN B12 Take 1,000 mcg by mouth daily.   Eliquis  5 MG Tabs tablet Generic drug: apixaban  Take 5 mg by mouth 2 (two) times daily.   FLUoxetine  10 MG capsule Commonly known as: PROZAC  Take 2 capsules (20 mg total) by mouth daily as needed (anxiety).   furosemide  20 MG tablet Commonly known as: LASIX  Take 1 tablet (20 mg total) by mouth daily. What changed:  medication strength how much to take   isosorbide  mononitrate 30 MG 24 hr tablet Commonly known as: IMDUR  Take 1 tablet (30 mg total) by mouth daily.   levalbuterol  1.25 MG/3ML nebulizer solution Commonly known as: XOPENEX  Take 1.25 mg by nebulization every 6 (six) hours as needed.   levothyroxine  75 MCG tablet Commonly known as: SYNTHROID  Take 75 mcg by mouth daily before breakfast.   mometasone -formoterol  200-5 MCG/ACT Aero Commonly known as: DULERA  Inhale 2 puffs into the lungs 2 (two) times daily.   montelukast 10 MG tablet Commonly known as:  SINGULAIR Take 1 tablet by mouth at bedtime.   pantoprazole  40 MG tablet Commonly known as: PROTONIX  Take 1 tablet (40 mg total) by mouth 2 (two) times daily.   potassium chloride  SA 20 MEQ tablet Commonly known as: KLOR-CON  M Take 2 tablets (40 mEq total) by mouth daily.   rOPINIRole  0.25 MG tablet Commonly known as: REQUIP  Take 1 tablet (0.25 mg total) by mouth at bedtime.   sucralfate  1 g tablet Commonly known as: Carafate  Take 1 tablet (1 g total) by mouth 4 (four) times daily.       Allergies  Allergen Reactions   Naproxen Sodium Anaphylaxis    Follow-up Information     Constancia Delton, MD Follow up.   Specialties: Cardiology, Radiology Contact information: [address] [place] [address] [phone]         Lyle San, MD Follow up.   Specialty: Family Medicine Why: follow-up with dr. Dean Every or dr. Junnie Olives in about 1 week to re-check your sodium level Contact information: [address]  [facility] [place] [address] [phone]                  The results of significant diagnostics from this hospitalization (including imaging, microbiology, ancillary and laboratory) are listed below for reference.    Significant Diagnostic Studies: CT Angio Chest PE W and/or Wo Contrast Result Date: 04/23/2024 CLINICAL DATA:  Shortness of breath and right lower quadrant pain EXAM: CT ANGIOGRAPHY CHEST CT ABDOMEN AND PELVIS WITH CONTRAST TECHNIQUE: Multidetector CT imaging of the chest was performed using the standard protocol during bolus administration of intravenous contrast. Multiplanar CT image reconstructions and MIPs were obtained to evaluate the vascular anatomy. Multidetector CT imaging of the abdomen and pelvis was performed using the standard protocol during bolus administration of intravenous contrast. RADIATION DOSE REDUCTION: This exam was performed according to the departmental dose-optimization program which  includes automated exposure control, adjustment of the mA and/or kV according to patient size and/or use of iterative reconstruction technique. CONTRAST:  OMNIPAQUE  IOHEXOL  350 MG/ML SOLN COMPARISON:  Chest CTA 10/28/2023 FINDINGS: CTA CHEST FINDINGS Cardiovascular: Satisfactory opacification of the pulmonary arteries to the segmental level. No evidence of pulmonary embolism. Enlarged heart with thickened left ventricle. Unchanged Transcatheter aortic valve replacement. Extensive atheromatous calcification of the aorta and great vessels. Mediastinum/Nodes: Negative for mass or adenopathy. Small hiatal hernia with no esophageal thickening. Lungs/Pleura: Generalized airway thickening with intermittent tracheal and bronchial narrowing compatible with tracheal bronchomalacia. Bilateral lower lobe bronchi are collapsed and intermittently opacified with mild hazy density and volume loss at the lung bases where there is streaky scarring. There is no edema, acute airspace disease, effusion, or pneumothorax. Musculoskeletal: No acute or aggressive finding Review of the MIP images confirms the above findings. CT ABDOMEN and PELVIS FINDINGS Hepatobiliary: No focal liver abnormality.Cholecystectomy. No biliary dilatation Pancreas: Unremarkable. Spleen: Unremarkable. Adrenals/Urinary Tract: Negative adrenals. No hydronephrosis or stone. Bilateral renal cystic densities. No worrisome renal lesion when accounting for areas of motion artifact and cyst distortion. Unremarkable bladder. Stomach/Bowel:  No obstruction. No visible bowel inflammation. Vascular/Lymphatic: No acute vascular abnormality. Extensive atheromatous calcification. No mass or adenopathy. Reproductive:Hysterectomy. Other: No ascites or pneumoperitoneum. Musculoskeletal: No acute abnormalities. 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 Review of the MIP images confirms the above findings. IMPRESSION: No acute finding in the chest or abdomen. 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 Chronic scarring and atelectasis in the lower lungs. Atherosclerosis. Electronically Signed   By: Ronnette Coke M.D.   On: 04/23/2024 05:12   CT ABDOMEN PELVIS W CONTRAST Result Date: 04/23/2024 CLINICAL DATA:  Shortness of breath and right lower quadrant pain EXAM: CT ANGIOGRAPHY CHEST CT ABDOMEN AND PELVIS WITH CONTRAST TECHNIQUE: Multidetector CT imaging of the chest was performed using the standard protocol during bolus administration of intravenous contrast. Multiplanar CT image reconstructions and MIPs were obtained to evaluate the vascular anatomy. Multidetector CT imaging of the abdomen and pelvis was performed using the standard protocol during bolus administration of intravenous contrast. RADIATION DOSE REDUCTION: This exam was performed according to the departmental dose-optimization program which includes automated exposure control, adjustment of the mA and/or kV according to patient size and/or use of iterative reconstruction technique. CONTRAST:  OMNIPAQUE  IOHEXOL  350 MG/ML SOLN COMPARISON:  Chest CTA 10/28/2023 FINDINGS: CTA CHEST FINDINGS Cardiovascular: Satisfactory opacification of the pulmonary arteries to the segmental level. No evidence of pulmonary embolism. Enlarged heart with thickened left ventricle. Unchanged Transcatheter aortic valve replacement. Extensive atheromatous calcification of the aorta and  great vessels. Mediastinum/Nodes: Negative for mass or adenopathy. Small hiatal hernia with no esophageal thickening. Lungs/Pleura: Generalized airway thickening with intermittent tracheal and bronchial narrowing compatible with tracheal bronchomalacia. Bilateral lower lobe bronchi are collapsed and intermittently opacified with mild hazy density and volume loss at the lung bases where there is streaky scarring. There is no edema, acute airspace disease, effusion, or pneumothorax. Musculoskeletal: No acute or aggressive  finding Review of the MIP images confirms the above findings. CT ABDOMEN and PELVIS FINDINGS Hepatobiliary: No focal liver abnormality.Cholecystectomy. No biliary dilatation Pancreas: Unremarkable. Spleen: Unremarkable. Adrenals/Urinary Tract: Negative adrenals. No hydronephrosis or stone. Bilateral renal cystic densities. No worrisome renal lesion when accounting for areas of motion artifact and cyst distortion. Unremarkable bladder. Stomach/Bowel:  No obstruction. No visible bowel inflammation. Vascular/Lymphatic: No acute vascular abnormality. Extensive atheromatous calcification. No mass or adenopathy. Reproductive:Hysterectomy. Other: No ascites or pneumoperitoneum. Musculoskeletal: No acute abnormalities. 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 Review of the MIP images confirms the above findings. IMPRESSION: No acute finding in the chest or abdomen. 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 Chronic scarring and atelectasis in the lower lungs. Atherosclerosis. Electronically Signed   By: Ronnette Coke M.D.   On: 04/23/2024 05:12   DG Chest Port 1 View Result Date: 04/22/2024 CLINICAL DATA:  Chest pain and shortness of breath EXAM: PORTABLE CHEST 1 VIEW COMPARISON:  03/27/2024 FINDINGS: Cardiac shadow is stable. Prior TAVR is seen. Aortic calcifications noted. Mild vascular congestion is noted. No focal infiltrate or effusion is seen. No bony abnormality is noted. Mild scarring is noted in the bases. IMPRESSION: Chronic changes. Mild vascular congestion. Electronically Signed   By: Violeta Grey M.D.   On: 04/22/2024 23:04   DG Chest Portable 1 View Result Date: 03/27/2024 CLINICAL DATA:  CHF.  COPD.  Weakness.  Shortness of breath. EXAM: PORTABLE CHEST 1 VIEW COMPARISON:  11/28/2023 FINDINGS: Right axillary vascular stent. Midline trachea. Mild cardiomegaly. Atherosclerosis in the transverse aorta. No pleural effusion or pneumothorax. Hyperinflation consistent  with COPD. Bibasilar scarring. IMPRESSION: Hyperinflation and bibasilar scarring.  No acute findings. Electronically Signed   By: Lore Rode M.D.   On: 03/27/2024 15:57    Microbiology: Recent Results (from the past 240 hours)  Resp panel by RT-PCR (RSV, Flu A&B, Covid) Anterior Nasal Swab     Status: None   Collection Time: 04/23/24 11:56 AM   Specimen: Anterior Nasal Swab  Result Value Ref Range Status   SARS Coronavirus 2 by RT PCR NEGATIVE NEGATIVE Final    Comment: (NOTE) SARS-CoV-2 target nucleic acids are NOT DETECTED.  The SARS-CoV-2 RNA is generally detectable in upper respiratory specimens during the acute phase of infection. The lowest concentration of SARS-CoV-2 viral copies this assay can detect is 138 copies/mL. A negative result does not preclude SARS-Cov-2 infection and should not be used as the sole basis for treatment or other patient management decisions. A negative result may occur with  improper specimen collection/handling, submission of specimen other than nasopharyngeal swab, presence of viral mutation(s) within the areas targeted by this assay, and inadequate number of viral copies(<138 copies/mL). A negative result must be combined with clinical observations, patient history, and epidemiological information. The expected result is Negative.  Fact Sheet for Patients:  BloggerCourse.com  Fact Sheet for Healthcare Providers:  SeriousBroker.it  This test is no t yet approved or cleared by the [place]  FDA and  has been authorized for detection and/or diagnosis of SARS-CoV-2 by FDA under an Emergency  Use Authorization (EUA). This EUA will remain  in effect (meaning this test can be used) for the duration of the COVID-19 declaration under Section 564(b)(1) of the Act, 21 U.S.C.section 360bbb-3(b)(1), unless the authorization is terminated  or revoked sooner.       Influenza A by PCR NEGATIVE NEGATIVE  Final   Influenza B by PCR NEGATIVE NEGATIVE Final    Comment: (NOTE) The Xpert Xpress SARS-CoV-2/FLU/RSV plus assay is intended as an aid in the diagnosis of influenza from Nasopharyngeal swab specimens and should not be used as a sole basis for treatment. Nasal washings and aspirates are unacceptable for Xpert Xpress SARS-CoV-2/FLU/RSV testing.  Fact Sheet for Patients: BloggerCourse.com  Fact Sheet for Healthcare Providers: SeriousBroker.it  This test is not yet approved or cleared by the [place]  FDA and has been authorized for detection and/or diagnosis of SARS-CoV-2 by FDA under an Emergency Use Authorization (EUA). This EUA will remain in effect (meaning this test can be used) for the duration of the COVID-19 declaration under Section 564(b)(1) of the Act, 21 U.S.C. section 360bbb-3(b)(1), unless the authorization is terminated or revoked.     Resp Syncytial Virus by PCR NEGATIVE NEGATIVE Final    Comment: (NOTE) Fact Sheet for Patients: BloggerCourse.com  Fact Sheet for Healthcare Providers: SeriousBroker.it  This test is not yet approved or cleared by the [place]  FDA and has been authorized for detection and/or diagnosis of SARS-CoV-2 by FDA under an Emergency Use Authorization (EUA). This EUA will remain in effect (meaning this test can be used) for the duration of the COVID-19 declaration under Section 564(b)(1) of the Act, 21 U.S.C. section 360bbb-3(b)(1), unless the authorization is terminated or revoked.  Performed at [facility], [address] [place], [place] [address]   Respiratory (~20 pathogens) panel by PCR     Status: None   Collection Time: 04/23/24  5:22 PM   Specimen: Nasopharyngeal Swab; Respiratory  Result Value Ref Range Status   Adenovirus NOT DETECTED NOT DETECTED Final   Coronavirus 229E NOT DETECTED NOT DETECTED Final     Comment: (NOTE) The Coronavirus on the Respiratory Panel, DOES NOT test for the novel  Coronavirus (2019 nCoV)    Coronavirus HKU1 NOT DETECTED NOT DETECTED Final   Coronavirus NL63 NOT DETECTED NOT DETECTED Final   Coronavirus OC43 NOT DETECTED NOT DETECTED Final   Metapneumovirus NOT DETECTED NOT DETECTED Final   Rhinovirus / Enterovirus NOT DETECTED NOT DETECTED Final   Influenza A NOT DETECTED NOT DETECTED Final   Influenza B NOT DETECTED NOT DETECTED Final   Parainfluenza Virus 1 NOT DETECTED NOT DETECTED Final   Parainfluenza Virus 2 NOT DETECTED NOT DETECTED Final   Parainfluenza Virus 3 NOT DETECTED NOT DETECTED Final   Parainfluenza Virus 4 NOT DETECTED NOT DETECTED Final   Respiratory Syncytial Virus NOT DETECTED NOT DETECTED Final   Bordetella pertussis NOT DETECTED NOT DETECTED Final   Bordetella Parapertussis NOT DETECTED NOT DETECTED Final   Chlamydophila pneumoniae NOT DETECTED NOT DETECTED Final   Mycoplasma pneumoniae NOT DETECTED NOT DETECTED Final    Comment: Performed at [facility], [address], [place], [place] [address]     Labs: Basic Metabolic Panel: Recent Labs  Lab 04/22/24 2242 04/23/24 0208 04/24/24 0443  NA 120* 128* 132*  K 3.4* 3.6 4.0  CL 85* 90* 98  CO2 25 24 26   GLUCOSE 121* 112* 84  BUN 13 11 10   CREATININE 0.90 0.93 0.89  CALCIUM  8.9 9.8 9.0  MG  --  2.1  --    Liver Function Tests: Recent Labs  Lab 04/23/24 0208  AST 35  ALT 26  ALKPHOS 95  BILITOT 0.5  PROT 7.5  ALBUMIN 4.1   Recent Labs  Lab 04/23/24 0208  LIPASE 34   No results for input(s): "AMMONIA" in the last 168 hours. CBC: Recent Labs  Lab 04/22/24 2242 04/24/24 0443  WBC 6.8 5.5  HGB 9.8* 9.4*  HCT 29.7* 29.9*  MCV 79.4* 83.3  PLT 438* 451*   Cardiac Enzymes: No results for input(s): "CKTOTAL", "CKMB", "CKMBINDEX", "TROPONINI" in the last 168 hours. BNP: BNP (last 3 results) Recent Labs    10/01/23 1533 11/04/23 0859 03/27/24 1410   BNP 217.2* 132.4* 99.7    ProBNP (last 3 results) Recent Labs    10/15/23 1521 12/26/23 1104  PROBNP 533 323    CBG: No results for input(s): "GLUCAP" in the last 168 hours.     Signed:  Raymonde Calico MD.  [facility] 04/24/2024, 10:02 AM

## 2024-04-24 NOTE — TOC CM/SW Note (Signed)
 Transition of Care [facility]) - Inpatient Brief Assessment   Patient Details  Name: Linda Brady MRN: [id number] Date of Birth: [birth year]  Transition of [facility]) CM/SW Contact:    Holland Lundborg, RN Phone Number: 04/24/2024, 10:17 AM   Clinical Narrative:  Brief assessment done.  Spoke with patient, she lives home alone but daughter and son in law are supportive.  One of them will provide transportation home today, she will make sure they bring her home O2 for travel.  No further TOC needs.   Transition of Care Asessment: Insurance and Status: Insurance coverage has been reviewed Patient has primary care physician: Yes Home environment has been reviewed: Alone, daughter supports Prior level of function:: Independent Prior/Current Home Services: No current home services Social Drivers of Health Review: SDOH reviewed no interventions necessary Readmission risk has been reviewed: Yes Transition of care needs: no transition of care needs at this time

## 2024-04-24 NOTE — Plan of Care (Signed)

## 2024-05-17 ENCOUNTER — Emergency Department

## 2024-05-17 ENCOUNTER — Other Ambulatory Visit: Payer: Self-pay

## 2024-05-17 DIAGNOSIS — Z888 Allergy status to other drugs, medicaments and biological substances status: Secondary | ICD-10-CM

## 2024-05-17 DIAGNOSIS — E876 Hypokalemia: Secondary | ICD-10-CM | POA: Diagnosis present

## 2024-05-17 DIAGNOSIS — Z79899 Other long term (current) drug therapy: Secondary | ICD-10-CM

## 2024-05-17 DIAGNOSIS — E039 Hypothyroidism, unspecified: Secondary | ICD-10-CM | POA: Diagnosis present

## 2024-05-17 DIAGNOSIS — I5033 Acute on chronic diastolic (congestive) heart failure: Secondary | ICD-10-CM | POA: Diagnosis present

## 2024-05-17 DIAGNOSIS — I480 Paroxysmal atrial fibrillation: Secondary | ICD-10-CM | POA: Diagnosis present

## 2024-05-17 DIAGNOSIS — I509 Heart failure, unspecified: Secondary | ICD-10-CM | POA: Diagnosis not present

## 2024-05-17 DIAGNOSIS — Z79890 Hormone replacement therapy (postmenopausal): Secondary | ICD-10-CM

## 2024-05-17 DIAGNOSIS — J449 Chronic obstructive pulmonary disease, unspecified: Secondary | ICD-10-CM | POA: Diagnosis present

## 2024-05-17 DIAGNOSIS — I110 Hypertensive heart disease with heart failure: Principal | ICD-10-CM | POA: Diagnosis present

## 2024-05-17 DIAGNOSIS — E871 Hypo-osmolality and hyponatremia: Secondary | ICD-10-CM | POA: Diagnosis present

## 2024-05-17 DIAGNOSIS — Z87891 Personal history of nicotine dependence: Secondary | ICD-10-CM

## 2024-05-17 DIAGNOSIS — Z7901 Long term (current) use of anticoagulants: Secondary | ICD-10-CM

## 2024-05-17 DIAGNOSIS — J9611 Chronic respiratory failure with hypoxia: Secondary | ICD-10-CM | POA: Diagnosis present

## 2024-05-17 DIAGNOSIS — K219 Gastro-esophageal reflux disease without esophagitis: Secondary | ICD-10-CM | POA: Diagnosis present

## 2024-05-17 DIAGNOSIS — I25119 Atherosclerotic heart disease of native coronary artery with unspecified angina pectoris: Secondary | ICD-10-CM | POA: Diagnosis present

## 2024-05-17 DIAGNOSIS — Z952 Presence of prosthetic heart valve: Secondary | ICD-10-CM

## 2024-05-17 DIAGNOSIS — Z7951 Long term (current) use of inhaled steroids: Secondary | ICD-10-CM

## 2024-05-17 DIAGNOSIS — E611 Iron deficiency: Secondary | ICD-10-CM | POA: Diagnosis present

## 2024-05-17 LAB — CBC
HCT: 26.4 % — ABNORMAL LOW (ref 36.0–46.0)
Hemoglobin: 8.4 g/dL — ABNORMAL LOW (ref 12.0–15.0)
MCH: 25.5 pg — ABNORMAL LOW (ref 26.0–34.0)
MCHC: 31.8 g/dL (ref 30.0–36.0)
MCV: 80 fL (ref 80.0–100.0)
Platelets: 385 10*3/uL (ref 150–400)
RBC: 3.3 MIL/uL — ABNORMAL LOW (ref 3.87–5.11)
RDW: 17.3 % — ABNORMAL HIGH (ref 11.5–15.5)
WBC: 7.5 10*3/uL (ref 4.0–10.5)
nRBC: 0 % (ref 0.0–0.2)

## 2024-05-17 LAB — COMPREHENSIVE METABOLIC PANEL WITH GFR
ALT: 35 U/L (ref 0–44)
AST: 42 U/L — ABNORMAL HIGH (ref 15–41)
Albumin: 3.4 g/dL — ABNORMAL LOW (ref 3.5–5.0)
Alkaline Phosphatase: 67 U/L (ref 38–126)
Anion gap: 10 (ref 5–15)
BUN: 11 mg/dL (ref 8–23)
CO2: 24 mmol/L (ref 22–32)
Calcium: 8.9 mg/dL (ref 8.9–10.3)
Chloride: 96 mmol/L — ABNORMAL LOW (ref 98–111)
Creatinine, Ser: 0.78 mg/dL (ref 0.44–1.00)
GFR, Estimated: >60 mL/min (ref >60)
Glucose, Bld: 110 mg/dL — ABNORMAL HIGH (ref 70–99)
Potassium: 3.9 mmol/L (ref 3.5–5.1)
Sodium: 130 mmol/L — ABNORMAL LOW (ref 135–145)
Total Bilirubin: 0.7 mg/dL (ref 0.0–1.2)
Total Protein: 6.8 g/dL (ref 6.5–8.1)

## 2024-05-17 LAB — TROPONIN I (HIGH SENSITIVITY): Troponin I (High Sensitivity): 13 ng/L (ref <18)

## 2024-05-17 LAB — BRAIN NATRIURETIC PEPTIDE: B Natriuretic Peptide: 140.1 pg/mL — ABNORMAL HIGH (ref 0.0–100.0)

## 2024-05-17 NOTE — ED Triage Notes (Signed)
 EMS brings pt in from home c/o swelling feet and SHOB esp when supine; O2 at 2l/min via [place] at baseline

## 2024-05-17 NOTE — ED Triage Notes (Signed)
 Pt presents via EMS c/o increased SOB and RUQ abd pain. Reports increased leg swelling. Recently admitted to hospital with hyponatremia and decreased diuretic.

## 2024-05-17 NOTE — ED Provider Triage Note (Signed)
 Emergency Medicine Provider Triage Evaluation Note  Linda Brady , a 83 y.o. female  was evaluated in triage.  Pt complains of shortness of breath, weakness, lower extremity edema, abdominal distention, right upper quadrant pain.  Patient denies fever, urinary symptoms, chest pain.  Patient is using 3 L of oxygen today usually she is using 2 L.  Patient was discharged May 23, they decrease diuretics.  Follow-up after admission was tomorrow..  Review of Systems  Positive:  Negative:   Physical Exam  BP (!) 201/81   Pulse 72   Temp 98.1 F (36.7 C) (Oral)   Resp 20   SpO2 100% during triage patient has systolic hypertension, patient is wearing oxygen by cannula 3 L Gen:   Awake, no distress   Resp:  Normal effort, bibasal crackles MSK:   Moves extremities without difficulty .  Bilateral lower extremities edema grade 3 Other:  Abdomen tenderness to palpation in the right upper quadrant  Medical Decision Making  Medically screening exam initiated at 7:30 PM.  Appropriate orders placed.  Linda Brady was informed that the remainder of the evaluation will be completed by another provider, this initial triage assessment does not replace that evaluation, and the importance of remaining in the ED until their evaluation is complete.  Patient with possible cardiac heart failure decompensated.  Ordered CBC CMP checks x-ray BNP   Awilda Lennox, PA-C 05/17/24 2354

## 2024-05-18 ENCOUNTER — Telehealth (HOSPITAL_COMMUNITY): Payer: Self-pay | Admitting: Pharmacy Technician

## 2024-05-18 ENCOUNTER — Other Ambulatory Visit (HOSPITAL_COMMUNITY): Payer: Self-pay

## 2024-05-18 ENCOUNTER — Inpatient Hospital Stay
Admission: EM | Admit: 2024-05-18 | Discharge: 2024-05-20 | DRG: 291 | Disposition: A | Discharge status: Home Health | Attending: Hospitalist | Admitting: Hospitalist

## 2024-05-18 DIAGNOSIS — I5031 Acute diastolic (congestive) heart failure: Secondary | ICD-10-CM

## 2024-05-18 DIAGNOSIS — Z79899 Other long term (current) drug therapy: Secondary | ICD-10-CM | POA: Diagnosis not present

## 2024-05-18 DIAGNOSIS — E039 Hypothyroidism, unspecified: Secondary | ICD-10-CM | POA: Diagnosis present

## 2024-05-18 DIAGNOSIS — E611 Iron deficiency: Secondary | ICD-10-CM | POA: Diagnosis present

## 2024-05-18 DIAGNOSIS — Z7951 Long term (current) use of inhaled steroids: Secondary | ICD-10-CM | POA: Diagnosis not present

## 2024-05-18 DIAGNOSIS — E876 Hypokalemia: Secondary | ICD-10-CM | POA: Diagnosis present

## 2024-05-18 DIAGNOSIS — I5033 Acute on chronic diastolic (congestive) heart failure: Secondary | ICD-10-CM | POA: Diagnosis present

## 2024-05-18 DIAGNOSIS — I509 Heart failure, unspecified: Secondary | ICD-10-CM

## 2024-05-18 DIAGNOSIS — Z7901 Long term (current) use of anticoagulants: Secondary | ICD-10-CM | POA: Diagnosis not present

## 2024-05-18 DIAGNOSIS — E871 Hypo-osmolality and hyponatremia: Secondary | ICD-10-CM

## 2024-05-18 DIAGNOSIS — K219 Gastro-esophageal reflux disease without esophagitis: Secondary | ICD-10-CM | POA: Diagnosis present

## 2024-05-18 DIAGNOSIS — J9611 Chronic respiratory failure with hypoxia: Secondary | ICD-10-CM | POA: Diagnosis present

## 2024-05-18 DIAGNOSIS — Z952 Presence of prosthetic heart valve: Secondary | ICD-10-CM | POA: Diagnosis not present

## 2024-05-18 DIAGNOSIS — I110 Hypertensive heart disease with heart failure: Secondary | ICD-10-CM | POA: Diagnosis present

## 2024-05-18 DIAGNOSIS — I480 Paroxysmal atrial fibrillation: Secondary | ICD-10-CM | POA: Diagnosis present

## 2024-05-18 DIAGNOSIS — J449 Chronic obstructive pulmonary disease, unspecified: Secondary | ICD-10-CM | POA: Diagnosis present

## 2024-05-18 DIAGNOSIS — Z79890 Hormone replacement therapy (postmenopausal): Secondary | ICD-10-CM | POA: Diagnosis not present

## 2024-05-18 DIAGNOSIS — Z87891 Personal history of nicotine dependence: Secondary | ICD-10-CM | POA: Diagnosis not present

## 2024-05-18 DIAGNOSIS — I25119 Atherosclerotic heart disease of native coronary artery with unspecified angina pectoris: Secondary | ICD-10-CM | POA: Diagnosis present

## 2024-05-18 DIAGNOSIS — R0600 Dyspnea, unspecified: Principal | ICD-10-CM

## 2024-05-18 DIAGNOSIS — Z888 Allergy status to other drugs, medicaments and biological substances status: Secondary | ICD-10-CM | POA: Diagnosis not present

## 2024-05-18 LAB — RETICULOCYTES
Immature Retic Fract: 24 % — ABNORMAL HIGH (ref 2.3–15.9)
RBC.: 3.77 MIL/uL — ABNORMAL LOW (ref 3.87–5.11)
Retic Count, Absolute: 82.9 10*3/uL (ref 19.0–186.0)
Retic Ct Pct: 2.2 % (ref 0.4–3.1)

## 2024-05-18 LAB — URINALYSIS, ROUTINE W REFLEX MICROSCOPIC
Bilirubin Urine: NEGATIVE
Glucose, UA: NEGATIVE mg/dL
Hgb urine dipstick: NEGATIVE
Ketones, ur: NEGATIVE mg/dL
Leukocytes,Ua: NEGATIVE
Nitrite: NEGATIVE
Protein, ur: NEGATIVE mg/dL
Specific Gravity, Urine: 1.003 — ABNORMAL LOW (ref 1.005–1.030)
pH: 7 (ref 5.0–8.0)

## 2024-05-18 LAB — IRON AND TIBC
Iron: 23 ug/dL — ABNORMAL LOW (ref 28–170)
Saturation Ratios: 5 % — ABNORMAL LOW (ref 10.4–31.8)
TIBC: 511 ug/dL — ABNORMAL HIGH (ref 250–450)
UIBC: 488 ug/dL

## 2024-05-18 LAB — FERRITIN: Ferritin: 5 ng/mL — ABNORMAL LOW (ref 11–307)

## 2024-05-18 LAB — TROPONIN I (HIGH SENSITIVITY): Troponin I (High Sensitivity): 14 ng/L (ref <18)

## 2024-05-18 MED ORDER — LEVALBUTEROL HCL 1.25 MG/0.5ML IN NEBU
1.2500 mg | INHALATION_SOLUTION | Freq: Four times a day (QID) | RESPIRATORY_TRACT | Status: DC | PRN
  Administered 2024-05-19 – 2024-05-20 (×4): 1.25 mg via RESPIRATORY_TRACT
  Filled 2024-05-18 (×5): qty 0.5

## 2024-05-18 MED ORDER — FLUTICASONE FUROATE-VILANTEROL 100-25 MCG/ACT IN AEPB
1.0000 | INHALATION_SPRAY | Freq: Every day | RESPIRATORY_TRACT | Status: DC
  Administered 2024-05-18 – 2024-05-20 (×3): 1 via RESPIRATORY_TRACT
  Filled 2024-05-18 (×2): qty 28

## 2024-05-18 MED ORDER — AMIODARONE HCL 200 MG PO TABS
200.0000 mg | ORAL_TABLET | Freq: Every day | ORAL | Status: DC
  Administered 2024-05-18 – 2024-05-20 (×3): 200 mg via ORAL
  Filled 2024-05-18 (×3): qty 1

## 2024-05-18 MED ORDER — FLUOXETINE HCL 20 MG PO CAPS
20.0000 mg | ORAL_CAPSULE | Freq: Every day | ORAL | Status: DC | PRN
  Administered 2024-05-18 – 2024-05-20 (×3): 20 mg via ORAL
  Filled 2024-05-18 (×3): qty 1

## 2024-05-18 MED ORDER — ALPRAZOLAM 0.25 MG PO TABS
0.2500 mg | ORAL_TABLET | Freq: Every evening | ORAL | Status: DC | PRN
  Administered 2024-05-18 – 2024-05-19 (×2): 0.25 mg via ORAL
  Filled 2024-05-18 (×3): qty 1

## 2024-05-18 MED ORDER — SUCRALFATE 1 G PO TABS
1.0000 g | ORAL_TABLET | Freq: Four times a day (QID) | ORAL | Status: DC
  Administered 2024-05-18 – 2024-05-20 (×9): 1 g via ORAL
  Filled 2024-05-18 (×9): qty 1

## 2024-05-18 MED ORDER — SODIUM CHLORIDE 0.9% FLUSH: 3.0000 mL | INTRAVENOUS | Status: DC | PRN

## 2024-05-18 MED ORDER — BUTALBITAL-APAP-CAFFEINE 50-325-40 MG PO TABS
1.0000 | ORAL_TABLET | Freq: Four times a day (QID) | ORAL | Status: DC | PRN
  Administered 2024-05-18 (×2): 1 via ORAL
  Filled 2024-05-18 (×2): qty 1

## 2024-05-18 MED ORDER — ROPINIROLE HCL 0.25 MG PO TABS
0.2500 mg | ORAL_TABLET | Freq: Every day | ORAL | Status: DC
  Administered 2024-05-18: 0.25 mg via ORAL
  Filled 2024-05-18: qty 1

## 2024-05-18 MED ORDER — LEVOTHYROXINE SODIUM 50 MCG PO TABS
75.0000 ug | ORAL_TABLET | Freq: Every day | ORAL | Status: DC
  Administered 2024-05-18 – 2024-05-20 (×3): 75 ug via ORAL
  Filled 2024-05-18 (×2): qty 1
  Filled 2024-05-18: qty 3

## 2024-05-18 MED ORDER — ONDANSETRON HCL 4 MG/2ML IJ SOLN
4.0000 mg | Freq: Once | INTRAMUSCULAR | Status: AC
  Administered 2024-05-18: 4 mg via INTRAVENOUS
  Filled 2024-05-18: qty 2

## 2024-05-18 MED ORDER — SODIUM CHLORIDE 0.9 % IV SOLN: 250.0000 mL | INTRAVENOUS | Status: AC | PRN

## 2024-05-18 MED ORDER — ACETAMINOPHEN 325 MG PO TABS
650.0000 mg | ORAL_TABLET | Freq: Four times a day (QID) | ORAL | Status: DC | PRN
  Administered 2024-05-18 (×2): 650 mg via ORAL
  Filled 2024-05-18 (×2): qty 2

## 2024-05-18 MED ORDER — ATORVASTATIN CALCIUM 20 MG PO TABS
40.0000 mg | ORAL_TABLET | Freq: Every day | ORAL | Status: DC
  Administered 2024-05-18 – 2024-05-20 (×3): 40 mg via ORAL
  Filled 2024-05-18 (×3): qty 2

## 2024-05-18 MED ORDER — MORPHINE SULFATE (PF) 2 MG/ML IV SOLN
2.0000 mg | Freq: Once | INTRAVENOUS | Status: AC
  Administered 2024-05-18: 2 mg via INTRAVENOUS
  Filled 2024-05-18: qty 1

## 2024-05-18 MED ORDER — SODIUM CHLORIDE 0.9% FLUSH
3.0000 mL | Freq: Two times a day (BID) | INTRAVENOUS | Status: DC
  Administered 2024-05-18 – 2024-05-19 (×4): 3 mL via INTRAVENOUS

## 2024-05-18 MED ORDER — ROPINIROLE HCL 0.25 MG PO TABS: 0.2500 mg | ORAL_TABLET | Freq: Every day | ORAL | Status: DC

## 2024-05-18 MED ORDER — ROPINIROLE HCL 0.25 MG PO TABS
0.2500 mg | ORAL_TABLET | Freq: Every day | ORAL | Status: DC
  Administered 2024-05-18: 0.25 mg via ORAL
  Filled 2024-05-18 (×2): qty 1

## 2024-05-18 MED ORDER — IPRATROPIUM-ALBUTEROL 0.5-2.5 (3) MG/3ML IN SOLN
3.0000 mL | Freq: Once | RESPIRATORY_TRACT | Status: AC
  Administered 2024-05-18: 3 mL via RESPIRATORY_TRACT
  Filled 2024-05-18: qty 3

## 2024-05-18 MED ORDER — APIXABAN 5 MG PO TABS
5.0000 mg | ORAL_TABLET | Freq: Two times a day (BID) | ORAL | Status: DC
  Administered 2024-05-18 – 2024-05-20 (×5): 5 mg via ORAL
  Filled 2024-05-18 (×5): qty 1

## 2024-05-18 MED ORDER — ALPRAZOLAM 0.25 MG PO TABS
0.2500 mg | ORAL_TABLET | Freq: Two times a day (BID) | ORAL | Status: DC | PRN
  Administered 2024-05-18: 0.25 mg via ORAL
  Filled 2024-05-18: qty 1

## 2024-05-18 MED ORDER — PANTOPRAZOLE SODIUM 40 MG PO TBEC
40.0000 mg | DELAYED_RELEASE_TABLET | Freq: Two times a day (BID) | ORAL | Status: DC
  Administered 2024-05-18 – 2024-05-20 (×5): 40 mg via ORAL
  Filled 2024-05-18 (×5): qty 1

## 2024-05-18 MED ORDER — FUROSEMIDE 10 MG/ML IJ SOLN
20.0000 mg | Freq: Once | INTRAMUSCULAR | Status: AC
  Administered 2024-05-18: 20 mg via INTRAVENOUS
  Filled 2024-05-18: qty 4

## 2024-05-18 MED ORDER — FUROSEMIDE 10 MG/ML IJ SOLN
40.0000 mg | Freq: Two times a day (BID) | INTRAMUSCULAR | Status: DC
  Administered 2024-05-18 – 2024-05-20 (×5): 40 mg via INTRAVENOUS
  Filled 2024-05-18 (×5): qty 4

## 2024-05-18 MED ORDER — ONDANSETRON HCL 4 MG/2ML IJ SOLN
4.0000 mg | Freq: Four times a day (QID) | INTRAMUSCULAR | Status: DC | PRN
Start: 2024-05-18 — End: 2024-05-20
  Administered 2024-05-18: 4 mg via INTRAVENOUS
  Filled 2024-05-18: qty 2

## 2024-05-18 MED ORDER — ISOSORBIDE MONONITRATE ER 30 MG PO TB24
30.0000 mg | ORAL_TABLET | Freq: Every day | ORAL | Status: DC
  Administered 2024-05-18 – 2024-05-20 (×3): 30 mg via ORAL
  Filled 2024-05-18 (×3): qty 1

## 2024-05-18 MED ORDER — MONTELUKAST SODIUM 10 MG PO TABS
10.0000 mg | ORAL_TABLET | Freq: Every day | ORAL | Status: DC
  Administered 2024-05-18 – 2024-05-19 (×2): 10 mg via ORAL
  Filled 2024-05-18 (×2): qty 1

## 2024-05-18 NOTE — ED Notes (Signed)
 Messaged MD regarding pt's request for daily inhalers and medication for headache.

## 2024-05-18 NOTE — ED Notes (Signed)
 Called CCMD for central monitoring at this time

## 2024-05-18 NOTE — ED Notes (Signed)
 Called CCMD regarding patient's transfer to ED 35

## 2024-05-18 NOTE — H&P (Signed)
 History and Physical    Linda Brady:[id number] DOB: Feb 21, 1941 DOA: 05/18/2024  PCP: Lyle San, MD (Confirm with patient/family/NH records and if not entered, this has to be entered at [facility], The point of entry) Patient coming from: Home  I have personally briefly reviewed patient's old medical records in [facility]  Chief Complaint: SOB, leg swelling  HPI: Linda Brady is a 83 y.o. 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  Symptoms started 5 days ago, patient started to notice increasing leg swelling and exertional dyspnea.  Has had occasional cough, no wheezing.in addition, she also complained about intermittent tightness in the chest, denied any lightheadedness palpitations fever chills.  She takes torsemide 20 mg daily and has not tried to increase the dosage.  Last night, she could not lie flat to sleep and decided come to the hospital.  ED Course: Afebrile, blood pressure 200/81 O2 saturation 100% 4 L.  Chest x-ray showed cardiomegaly and central congestion, blood work showed hemoglobin 8.4 compared to baseline 9.4-10.0, WBC 7.5 BUN 11, creatinine 0.7 bicarb 24.  Troponin negative x 2.  Patient was given IV Lasix  20 mg x 1 in the ED.  Review of Systems: As per HPI otherwise 14 point review of systems negative.    Past Medical History:  Diagnosis Date   A-fib [facility])    CHF (congestive heart failure) (HCC)    COPD (chronic obstructive pulmonary disease) (HCC)    GERD (gastroesophageal reflux disease)    Hypertension    Hypothyroidism    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   s/p TAVR with a 23 mm Edwards S3UR via TF approach by Dr.  Lorie Rook and Dr. Honey Lusty   Thyroid  disease     Past Surgical History:  Procedure Laterality Date   BREAST CYST ASPIRATION Left 10 plus yrs   benign-twice   CATARACT EXTRACTION W/PHACO Left 03/20/2023   Procedure: CATARACT EXTRACTION PHACO AND INTRAOCULAR LENS PLACEMENT (IOC) LEFT VISION BLUE;  Surgeon: Trudi Fus, MD;  Location: [facility];  Service: Ophthalmology;  Laterality: Left;  21.02   01:38.2   INTRAOPERATIVE TRANSTHORACIC ECHOCARDIOGRAM N/A 12/16/2023   Procedure: INTRAOPERATIVE TRANSTHORACIC ECHOCARDIOGRAM;  Surgeon: Kyra Phy, MD;  Location: [facility] INVASIVE CV LAB;  Service: Cardiovascular;  Laterality: N/A;   LEFT HEART CATH AND CORONARY ANGIOGRAPHY N/A 09/11/2020   Procedure: LEFT HEART CATH AND CORONARY ANGIOGRAPHY;  Surgeon: Percival Brace, MD;  Location: [facility] INVASIVE CV LAB;  Service: Cardiovascular;  Laterality: N/A;   PERIPHERAL VASCULAR INTERVENTION Right 10/24/2023   Procedure: PERIPHERAL VASCULAR INTERVENTION;  Surgeon: Carlene Che, MD;  Location: [facility]VASIVE CV LAB;  Service: Cardiovascular;  Laterality: Right;  right axillary stent   RIGHT/LEFT HEART CATH AND CORONARY ANGIOGRAPHY N/A 10/23/2023   Procedure: RIGHT/LEFT HEART CATH AND CORONARY ANGIOGRAPHY;  Surgeon: Kyra Phy, MD;  Location: [facility] INVASIVE CV LAB;  Service: Cardiovascular;  Laterality: N/A;   UPPER EXTREMITY ANGIOGRAPHY Right 10/24/2023   Procedure: Upper Extremity Angiography;  Surgeon: Carlene Che, MD;  Location: [facility] INVASIVE CV LAB;  Service: Cardiovascular;  Laterality: Right;     reports that she has quit smoking. Her smoking use included cigarettes. She has a 6 pack-year smoking history. She has never used smokeless tobacco.  She reports that she does not currently use alcohol. She reports that she does not currently use drugs.  Allergies  Allergen Reactions   Naproxen Sodium Anaphylaxis    Family History  Problem Relation Age of Onset   Breast  cancer Neg Hx      Prior to Admission medications   Medication Sig Start Date End Date Taking? Authorizing Provider  ALPRAZolam  (XANAX ) 0.25 MG tablet Take 0.25 mg by mouth at bedtime as needed for sleep.   Yes [provider]  amiodarone  (PACERONE ) 200 MG tablet Take 1 tablet (200 mg total) by mouth daily. 01/16/24  Yes Ardia Kraft, PA-C  apixaban  (ELIQUIS ) 5 MG TABS tablet Take 5 mg by mouth 2 (two) times daily.   Yes [provider]  atorvastatin  (LIPITOR) 40 MG tablet Take 1 tablet (40 mg total) by mouth daily. 09/20/23  Yes Alexander, Natalie, DO  cholecalciferol  (VITAMIN D3) 25 MCG (1000 UNIT) tablet Take 1,000 Units by mouth daily.   Yes [provider]  FLUoxetine  (PROZAC ) 10 MG capsule Take 2 capsules (20 mg total) by mouth daily as needed (anxiety). 11/17/23 05/18/24 Yes Jacquie Maudlin, MD  furosemide  (LASIX ) 20 MG tablet Take 1 tablet (20 mg total) by mouth daily. 04/24/24 07/23/24 Yes Wouk, Haynes Lips, MD  Ipratropium-Albuterol  (COMBIVENT  RESPIMAT) 20-100 MCG/ACT AERS respimat Inhale 1 puff into the lungs every 6 (six) hours as needed for wheezing.   Yes [provider]  isosorbide  mononitrate (IMDUR ) 30 MG 24 hr tablet Take 1 tablet (30 mg total) by mouth daily. 10/14/22  Yes Furth, Cadence H, PA-C  levalbuterol  (XOPENEX ) 1.25 MG/3ML nebulizer solution Take 1.25 mg by nebulization every 6 (six) hours as needed. 12/08/23 12/07/24 Yes [provider]  levothyroxine  (SYNTHROID ) 75 MCG tablet Take 75 mcg by mouth daily before breakfast. 05/02/20  Yes [provider]  mometasone -formoterol  (DULERA ) 200-5 MCG/ACT AERO Inhale 2 puffs into the lungs 2 (two) times daily. 12/17/23  Yes Ardia Kraft, PA-C  montelukast  (SINGULAIR ) 10 MG tablet Take 1 tablet by mouth at bedtime. 02/03/24 02/02/25 Yes [provider]  pantoprazole  (PROTONIX ) 40 MG tablet Take 1 tablet (40 mg total) by mouth 2 (two) times daily. 11/17/23 05/18/24 Yes  Jacquie Maudlin, MD  potassium chloride  SA (KLOR-CON  M) 20 MEQ tablet Take 2 tablets (40 mEq total) by mouth daily. 04/13/24  Yes Agbor-Etang, Polly Brink, MD  rOPINIRole  (REQUIP ) 0.25 MG tablet Take 1 tablet (0.25 mg total) by mouth at bedtime. 10/28/23 05/18/24 Yes Magdalene School, MD  sucralfate  (CARAFATE ) 1 g tablet Take 1 tablet (1 g total) by mouth 4 (four) times daily. 11/17/23  Yes Jacquie Maudlin, MD  vitamin B-12 (CYANOCOBALAMIN ) 1000 MCG tablet Take 1,000 mcg by mouth daily.   Yes [provider]    Physical Exam: Vitals:   05/18/24 0545 05/18/24 0700 05/18/24 0817 05/18/24 0845  BP: (!) 127/59 125/67  (!) 144/62  Pulse: 76 66 65 65  Resp: 13 17 17 19   Temp:    97.6 F (36.4 C)  TempSrc:      SpO2: 100% 100% 100% 100%    Constitutional: NAD, calm, comfortable Vitals:   05/18/24 0545 05/18/24 0700 05/18/24 0817 05/18/24 0845  BP: (!) 127/59 125/67  (!) 144/62  Pulse: 76 66 65 65  Resp: 13 17 17 19   Temp:    97.6 F (36.4 C)  TempSrc:      SpO2: 100% 100% 100% 100%   Eyes: PERRL, lids and conjunctivae normal ENMT: Mucous  membranes are moist. Posterior pharynx clear of any exudate or lesions.Normal dentition.  Neck: normal, supple, no masses, no thyromegaly Respiratory: clear to auscultation bilaterally, no wheezing, fine crackles on bilateral lower fields, increasing respiratory effort. No accessory muscle use.  Cardiovascular: Regular rate and rhythm, no murmurs / rubs / gallops. 2+ extremity edema. 2+ pedal pulses. No carotid bruits.  Abdomen: no tenderness, no masses palpated. No hepatosplenomegaly. Bowel sounds positive.  Musculoskeletal: no clubbing / cyanosis. No joint deformity upper and lower extremities. Good ROM, no contractures. Normal muscle tone.  Skin: no rashes, lesions, ulcers. No induration Neurologic: CN 2-12 grossly intact. Sensation intact, DTR normal. Strength 5/5 in all 4.  Psychiatric: Normal judgment and insight. Alert and oriented x 3.  Normal mood.     Labs on Admission: I have personally reviewed following labs and imaging studies  CBC: Recent Labs  Lab 05/17/24 1935  WBC 7.5  HGB 8.4*  HCT 26.4*  MCV 80.0  PLT 385   Basic Metabolic Panel: Recent Labs  Lab 05/17/24 1935  NA 130*  K 3.9  CL 96*  CO2 24  GLUCOSE 110*  BUN 11  CREATININE 0.78  CALCIUM  8.9   GFR: CrCl cannot be calculated (Unknown ideal weight.). Liver Function Tests: Recent Labs  Lab 05/17/24 1935  AST 42*  ALT 35  ALKPHOS 67  BILITOT 0.7  PROT 6.8  ALBUMIN 3.4*   No results for input(s): LIPASE, AMYLASE in the last 168 hours. No results for input(s): AMMONIA in the last 168 hours. Coagulation Profile: No results for input(s): INR, PROTIME in the last 168 hours. Cardiac Enzymes: No results for input(s): CKTOTAL, CKMB, CKMBINDEX, TROPONINI in the last 168 hours. BNP (last 3 results) Recent Labs    10/15/23 1521 12/26/23 1104  PROBNP 533 323   HbA1C: No results for input(s): HGBA1C in the last 72 hours. CBG: No results for input(s): GLUCAP in the last 168 hours. Lipid Profile: No results for input(s): CHOL, HDL, LDLCALC, TRIG, CHOLHDL, LDLDIRECT in the last 72 hours. Thyroid  Function Tests: No results for input(s): TSH, T4TOTAL, FREET4, T3FREE, THYROIDAB in the last 72 hours. Anemia Panel: No results for input(s): VITAMINB12, FOLATE, FERRITIN, TIBC, IRON, RETICCTPCT in the last 72 hours. Urine analysis:    Component Value Date/Time   COLORURINE COLORLESS (A) 05/18/2024 0320   APPEARANCEUR CLEAR (A) 05/18/2024 0320   APPEARANCEUR Hazy 11/12/2012 1355   LABSPEC 1.003 (L) 05/18/2024 0320   LABSPEC 1.005 11/12/2012 1355   PHURINE 7.0 05/18/2024 0320   GLUCOSEU NEGATIVE 05/18/2024 0320   GLUCOSEU Negative 11/12/2012 1355   HGBUR NEGATIVE 05/18/2024 0320   BILIRUBINUR NEGATIVE 05/18/2024 0320   BILIRUBINUR Negative 11/12/2012 1355   KETONESUR NEGATIVE  05/18/2024 0320   PROTEINUR NEGATIVE 05/18/2024 0320   NITRITE NEGATIVE 05/18/2024 0320   LEUKOCYTESUR NEGATIVE 05/18/2024 0320   LEUKOCYTESUR Trace 11/12/2012 1355    Radiological Exams on Admission: DG Chest Port 1 View Result Date: 05/17/2024 CLINICAL DATA:  Shortness of breath EXAM: PORTABLE CHEST 1 VIEW COMPARISON:  04/22/2024 FINDINGS: Cardiomegaly with central congestion. Valve prosthesis. Atelectasis or scarring at the bases. No pleural effusion or pneumothorax. IMPRESSION: Cardiomegaly with central congestion. Atelectasis or scarring at the bases. Electronically Signed   By: Esmeralda Hedge M.D.   On: 05/17/2024 19:59    EKG: Independently reviewed.  Sinus rhythm, no acute ST changes compared to previous EKG.  Assessment/Plan Principal Problem:   Acute CHF (congestive heart failure) (HCC) Active Problems:   CHF (congestive heart failure) (  HCC)  (please populate well all problems here in Problem List. (For example, if patient is on BP meds at home and you resume or decide to hold them, it is a problem that needs to be her. Same for CAD, COPD, HLD and so on)  Acute on chronic HFpEF decompensation - Significant fluid overload on physical exam and imaging study - IV diuresis Lasix  40 mg twice daily, daily BMP - Echocardiogram was done less than 3 months ago, will not repeat. - Strict I&O's  Anginal-like chest pain History of nonobstructive CAD - Likely secondary to CHF decompensation, EKG negative for acute findings and troponin negative x 2, ACS ruled out - Review of recent cardiac cath done November 2024 showed nonobstructive CAD - Outpatient follow-up with cardiology for stress test  HTN, uncontrolled - Likely secondary to fluid overload from CHF, improving after given Lasix  in the ED - Resume home BP meds including Imdur .  Worsening of chronic normocytic anemia - Check iron study as patient is on Eliquis  - Patient denied any black tarry stool or blood in the stool -  Check FOBT  Hyponatremia - Acute on chronic, likely secondary to CHF, diuresis as above  COPD - Stable, continue ICS and LABA - As needed Xopenex   PAF - In sinus rhythm - Continue Eliquis   Hypothyroidism - Continue Synthroid   Deconditioning - PT evaluation  DVT prophylaxis: Eliquis  Code Status: Full code Family Communication: None at bedside Disposition Plan: Patient with history of CHF coming in with significant fluid overload CHF decompensation requiring IV diuresis and closely monitoring clinical progress, expect more than 2 midnights of stay Consults called: None Admission status: Telemetry admission   [place] MD [facility] Pager [phone] 05/18/2024, 8:55 AM

## 2024-05-18 NOTE — ED Notes (Signed)
 Pt refused the purewick

## 2024-05-18 NOTE — ED Notes (Signed)
 Pt given meal tray at this time

## 2024-05-18 NOTE — Evaluation (Signed)
 Physical Therapy Evaluation Patient Details Name: Linda Brady MRN: [id number] DOB: [birth year] Today's Date: 05/18/2024  History of Present Illness  83 y/o female presented to ED on 05/17/24 for SOB and peripheral edema. Admitted for acute CGF exacerbation. PMH: CAD, COPD on 2L @ baseline, HTN, dyslipidemia, GERD, hyponatremia, HFpEF, aortic stenosis, pAfib with RVR, hypothyroidism, anxiety/depression, s/p TAVR 12/2023  Clinical Impression  Patient admitted with the above. PTA, patient lives alone and was independent with no AD. Daughter assists with driving to/from appointments and grocery shopping. Typically, patient is on 2L @ baseline, however throughout session utilized 3L O2. Completed bed mobility and sit to stand modI. Ambulated to bathroom and within room with no AD and supervision for multiple line management. Patient at or near baseline functioning. No further skilled PT needs identified acutely. PT will sign off at this time.         If plan is discharge home, recommend the following: Help with stairs or ramp for entrance;Assist for transportation;Assistance with cooking/housework   Can travel by private vehicle        Equipment Recommendations None recommended by PT  Recommendations for Other Services       Functional Status Assessment Patient has had a recent decline in their functional status and demonstrates the ability to make significant improvements in function in a reasonable and predictable amount of time.     Precautions / Restrictions Precautions Precautions: Fall Recall of Precautions/Restrictions: Intact Restrictions Weight Bearing Restrictions Per Provider Order: No      Mobility  Bed Mobility Overal bed mobility: Modified Independent                  Transfers Overall transfer level: Modified independent Equipment used: None                    Ambulation/Gait Ambulation/Gait assistance: Supervision Gait Distance (Feet): 15  Feet (+25') Assistive device: None Gait Pattern/deviations: Step-through pattern, Decreased stride length Gait velocity: decreased     General Gait Details: supervision for safety due to line management with heart monitor, spO2 monitor, and O2 tubing  Stairs            Wheelchair Mobility     Tilt Bed    Modified Rankin (Stroke Patients Only)       Balance Overall balance assessment: Mild deficits observed, not formally tested                                           Pertinent Vitals/Pain Pain Assessment Pain Assessment: No/denies pain    Home Living Family/patient expects to be discharged to:: Private residence Living Arrangements: Alone Available Help at Discharge: Family;Available PRN/intermittently (daughter drives her to/from appointments and does grocery shopping) Type of Home: House Home Access: Stairs to enter Entrance Stairs-Rails: Right;Left;Can reach both Entrance Stairs-Number of Steps: 4   Home Layout: One level Home Equipment: Shower seat - built Char[employer] fundraiser (2 wheels)      Prior Function Prior Level of Function : Independent/Modified Independent             Mobility Comments: ambulatory without AD recently. Had used RW due to weakness but with assistance from HHPT, patient improved to no AD ADLs Comments: ind with ADLs. Daughter drives her to/from appointments and does grocery shopping     Extremity/Trunk Assessment   Upper Extremity Assessment Upper Extremity Assessment: Overall  WFL for tasks assessed    Lower Extremity Assessment Lower Extremity Assessment: Generalized weakness    Cervical / Trunk Assessment Cervical / Trunk Assessment: Normal  Communication   Communication Communication: Impaired Factors Affecting Communication: Hearing impaired    Cognition Arousal: Alert Behavior During Therapy: WFL for tasks assessed/performed   PT - Cognitive impairments: No apparent impairments                          Following commands: Intact       Cueing       General Comments General comments (skin integrity, edema, etc.): on 3L O2 throughout session, spO2 WFL    Exercises     Assessment/Plan    PT Assessment Patient does not need any further PT services  PT Problem List         PT Treatment Interventions      PT Goals (Current goals can be found in the Care Plan section)  Acute Rehab PT Goals Patient Stated Goal: to go home PT Goal Formulation: All assessment and education complete, DC therapy    Frequency       Co-evaluation               AM-PAC PT 6 Clicks Mobility  Outcome Measure Help needed turning from your back to your side while in a flat bed without using bedrails?: None Help needed moving from lying on your back to sitting on the side of a flat bed without using bedrails?: None Help needed moving to and from a bed to a chair (including a wheelchair)?: None Help needed standing up from a chair using your arms (e.g., wheelchair or bedside chair)?: None Help needed to walk in hospital room?: A Little Help needed climbing 3-5 steps with a railing? : A Little 6 Click Score: 22    End of Session Equipment Utilized During Treatment: Oxygen Activity Tolerance: Patient tolerated treatment well Patient left: in bed;with call bell/phone within reach;with bed alarm set Nurse Communication: Mobility status PT Visit Diagnosis: Muscle weakness (generalized) (M62.81)    Time: 2595-6387 PT Time Calculation (min) (ACUTE ONLY): 21 min   Charges:   PT Evaluation $PT Eval Moderate Complexity: 1 Mod   PT General Charges $$ ACUTE PT VISIT: 1 Visit         Janine Melbourne, PT, DPT Physical Therapist - [facility]  [facility]   Alberta Lenhard A Chief Walkup 05/18/2024, 11:09 AM

## 2024-05-18 NOTE — Telephone Encounter (Signed)
 Patient Product/process development scientist completed.    The patient is insured through [employer].     Ran test claim for Entresto 24-26 mg and the current 30 day co-pay is $122.79.  Ran test claim for Farxiga 10 mg and the current 30 day co-pay is $104.44.  Ran test claim for Jardiance 10 mg and the current 30 day co-pay is $109.61.  This test claim was processed through [facility]- copay amounts may vary at other pharmacies due to pharmacy/plan contracts, or as the patient moves through the different stages of their insurance plan.     Morgan Arab, CPHT Pharmacy Technician III Certified Patient Advocate [facility] Patient Advocate Team Direct Number: [phone]  Fax: [phone]

## 2024-05-18 NOTE — ED Notes (Signed)
 This tech assisted pt up to restroom. Pt able to ambulate with steady gait. Pt is now back in bed and fall bundle is placed by this tech.

## 2024-05-18 NOTE — ED Provider Notes (Signed)
 [facility] Provider Note    Event Date/Time   First MD Initiated Contact with Patient 05/18/24 0100     (approximate)   History   Shortness of Breath   HPI  Linda Brady is a 83 y.o. female brought to the ED via EMS from home with a chief complaint of chest pain, shortness of breath, BLE swelling.  History of COPD, CHF on 2 L nasal cannula oxygen at baseline.  Patient has had to increase her oxygen to 3 L.  Recent hospitalization for hyponatremia and her Lasix  was cut in half.  Denies fever/chills, abdominal pain, nausea/vomiting or dizziness.     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 Lorie Rook and Dr. 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   Restless leg syndrome 10/24/2023   Paroxysmal atrial fibrillation with RVR (HCC) 09/18/2023   Afib (HCC) 09/18/2023   Chronic heart failure with preserved ejection fraction (HFpEF) (HCC) 12/16/2021   Anxiety 12/15/2021   Coronary artery disease involving native coronary artery of native heart    Aortic stenosis, severe    Hyponatremia 12/14/2021   Dyslipidemia 12/14/2021   GERD (gastroesophageal reflux disease) 12/14/2021   Vitamin B12 deficiency 12/14/2021   Chest pain 09/10/2020   Hypokalemia 09/10/2020   Hypothyroidism    COPD (chronic obstructive pulmonary disease) (HCC)    Essential hypertension      Past  Surgical History   Past Surgical History:  Procedure Laterality Date   BREAST CYST ASPIRATION Left 10 plus yrs   benign-twice   CATARACT EXTRACTION W/PHACO Left 03/20/2023   Procedure: CATARACT EXTRACTION PHACO AND INTRAOCULAR LENS PLACEMENT (IOC) LEFT VISION BLUE;  Surgeon: Trudi Fus, MD;  Location: [facility];  Service: Ophthalmology;  Laterality: Left;  21.02   01:38.2   INTRAOPERATIVE TRANSTHORACIC ECHOCARDIOGRAM N/A 12/16/2023   Procedure: INTRAOPERATIVE TRANSTHORACIC ECHOCARDIOGRAM;  Surgeon: Kyra Phy, MD;  Location: [facility];  Service: Cardiovascular;  Laterality: N/A;   LEFT HEART CATH AND CORONARY ANGIOGRAPHY N/A 09/11/2020   Procedure: LEFT HEART CATH AND CORONARY ANGIOGRAPHY;  Surgeon: Percival Brace, MD;  Location: [facility] INVASIVE CV LAB;  Service: Cardiovascular;  Laterality: N/A;   PERIPHERAL VASCULAR INTERVENTION Right 10/24/2023   Procedure: PERIPHERAL VASCULAR INTERVENTION;  Surgeon: Carlene Che, MD;  Location: [facility]VASIVE CV LAB;  Service: Cardiovascular;  Laterality: Right;  right axillary stent   RIGHT/LEFT HEART CATH AND CORONARY ANGIOGRAPHY N/A 10/23/2023   Procedure: RIGHT/LEFT HEART CATH AND CORONARY ANGIOGRAPHY;  Surgeon: Kyra Phy, MD;  Location: [facility] INVASIVE CV LAB;  Service: Cardiovascular;  Laterality: N/A;   UPPER EXTREMITY ANGIOGRAPHY Right 10/24/2023   Procedure: Upper Extremity Angiography;  Surgeon: Carlene Che, MD;  Location: [facility] INVASIVE CV LAB;  Service: Cardiovascular;  Laterality: Right;     Home Medications   Prior to Admission medications  Medication Sig Start Date End Date Taking? Authorizing Provider  ALPRAZolam  (XANAX ) 0.25 MG tablet Take 0.25 mg by mouth at bedtime as needed for sleep.    [provider]  amiodarone  (PACERONE ) 200 MG tablet Take 1 tablet (200 mg total) by mouth daily. 01/16/24   Ardia Kraft, PA-C  apixaban  (ELIQUIS ) 5 MG TABS tablet Take 5 mg by mouth 2  (two) times daily.    [provider]  atorvastatin  (LIPITOR) 40 MG tablet Take 1 tablet (40 mg total) by mouth daily. 09/20/23   Alexander, Natalie, DO  cholecalciferol  (VITAMIN D3) 25 MCG (1000 UNIT) tablet Take 1,000 Units by mouth daily.    [provider]  FLUoxetine  (PROZAC ) 10 MG capsule Take 2 capsules (20 mg total) by mouth daily as needed (anxiety). 11/17/23 04/23/24  Jacquie Maudlin, MD  furosemide  (LASIX ) 20 MG tablet Take 1 tablet (20 mg total) by mouth daily. 04/24/24 07/23/24  Wouk, Haynes Lips, MD  Ipratropium-Albuterol  (COMBIVENT  RESPIMAT) 20-100 MCG/ACT AERS respimat Inhale 1 puff into the lungs every 6 (six) hours as needed for wheezing.    [provider]  isosorbide  mononitrate (IMDUR ) 30 MG 24 hr tablet Take 1 tablet (30 mg total) by mouth daily. 10/14/22   Furth, Cadence H, PA-C  levalbuterol  (XOPENEX ) 1.25 MG/3ML nebulizer solution Take 1.25 mg by nebulization every 6 (six) hours as needed. 12/08/23 12/07/24  [provider]  levothyroxine  (SYNTHROID ) 75 MCG tablet Take 75 mcg by mouth daily before breakfast. 05/02/20   [provider]  mometasone -formoterol  (DULERA ) 200-5 MCG/ACT AERO Inhale 2 puffs into the lungs 2 (two) times daily. 12/17/23   Ardia Kraft, PA-C  montelukast  (SINGULAIR ) 10 MG tablet Take 1 tablet by mouth at bedtime. 02/03/24 02/02/25  [provider]  pantoprazole  (PROTONIX ) 40 MG tablet Take 1 tablet (40 mg total) by mouth 2 (two) times daily. 11/17/23 04/23/24  Jacquie Maudlin, MD  potassium chloride  SA (KLOR-CON  M) 20 MEQ tablet Take 2 tablets (40 mEq total) by mouth daily. 04/13/24   Constancia Delton, MD  rOPINIRole  (REQUIP ) 0.25 MG tablet Take 1 tablet (0.25 mg total) by mouth at bedtime. 10/28/23 04/23/24  Magdalene School, MD  sucralfate  (CARAFATE ) 1 g tablet Take 1 tablet (1 g total) by mouth 4 (four) times daily. Patient not taking: Reported on 04/23/2024 11/17/23   Jacquie Maudlin, MD  vitamin  B-12 (CYANOCOBALAMIN ) 1000 MCG tablet Take 1,000 mcg by mouth daily.    [provider]     Allergies  Naproxen sodium   Family History   Family History  Problem Relation Age of Onset   Breast cancer Neg Hx      Physical Exam  Triage Vital Signs: ED Triage Vitals  Encounter Vitals Group     BP 05/17/24 1928 (!) 201/81     Girls Systolic BP Percentile --      Girls Diastolic BP Percentile --      Boys Systolic BP Percentile --      Boys Diastolic BP Percentile --      Pulse Rate 05/17/24 1928 72     Resp 05/17/24 1928 20     Temp 05/17/24 1928 98.1 F (36.7 C)     Temp Source 05/17/24 1928 Oral     SpO2 05/17/24 1907 100 %     Weight --      Height --      Head Circumference --      Peak Flow --      Pain  Score 05/17/24 1929 5     Pain Loc --      Pain Education --      Exclude from Growth Chart --     Updated Vital Signs: BP (!) 187/84 (BP Location: Left Arm)   Pulse 74   Temp 98 F (36.7 C) (Oral)   Resp 20   SpO2 100%    General: Awake, mild distress.  CV:  RRR.  Good peripheral perfusion.  Resp:  Increased effort.  Faint bibasilar rales. Abd:  Nontender.  No distention.  Other:  1+ BLE pitting edema.   ED Results / Procedures / Treatments  Labs (all labs ordered are listed, but only abnormal results are displayed) Labs Reviewed  CBC - Abnormal; Notable for the following components:      Result Value   RBC 3.30 (*)    Hemoglobin 8.4 (*)    HCT 26.4 (*)    MCH 25.5 (*)    RDW 17.3 (*)    All other components within normal limits  BRAIN NATRIURETIC PEPTIDE - Abnormal; Notable for the following components:   B Natriuretic Peptide 140.1 (*)    All other components within normal limits  COMPREHENSIVE METABOLIC PANEL WITH GFR - Abnormal; Notable for the following components:   Sodium 130 (*)    Chloride 96 (*)    Glucose, Bld 110 (*)    Albumin 3.4 (*)    AST 42 (*)    All other components within normal limits  URINALYSIS, ROUTINE W  REFLEX MICROSCOPIC  TROPONIN I (HIGH SENSITIVITY)  TROPONIN I (HIGH SENSITIVITY)     EKG  ED ECG REPORT I, Mellie Buccellato J, the attending physician, personally viewed and interpreted this ECG.   Date: 05/18/2024  EKG Time: 1925  Rate: 75  Rhythm: normal sinus rhythm  Axis: Normal  Intervals:none  ST&T Change: Nonspecific    RADIOLOGY I have independently visualized and interpreted patient's imaging study as well as noted the radiology interpretation:  Chest x-ray: Cardiomegaly with central congestion  Official radiology report(s): DG Chest Port 1 View Result Date: 05/17/2024 CLINICAL DATA:  Shortness of breath EXAM: PORTABLE CHEST 1 VIEW COMPARISON:  04/22/2024 FINDINGS: Cardiomegaly with central congestion. Valve prosthesis. Atelectasis or scarring at the bases. No pleural effusion or pneumothorax. IMPRESSION: Cardiomegaly with central congestion. Atelectasis or scarring at the bases. Electronically Signed   By: Esmeralda Hedge M.D.   On: 05/17/2024 19:59     PROCEDURES:  Critical Care performed: No  .1-3 Lead EKG Interpretation  Performed by: Norlene Beavers, MD Authorized by: Norlene Beavers, MD     Interpretation: normal     ECG rate:  75   ECG rate assessment: normal     Rhythm: sinus rhythm     Ectopy: none     Conduction: normal   Comments:     Patient placed on cardiac monitor to evaluate for arrhythmias    MEDICATIONS ORDERED IN ED: Medications  morphine  (PF) 2 MG/ML injection 2 mg (2 mg Intravenous Given 05/18/24 0123)  ondansetron  (ZOFRAN ) injection 4 mg (4 mg Intravenous Given 05/18/24 0121)  furosemide  (LASIX ) injection 20 mg (20 mg Intravenous Given 05/18/24 0122)     IMPRESSION / MDM / ASSESSMENT AND PLAN / ED COURSE  I reviewed the triage vital signs and the nursing notes.                             83 year old female presenting  with chest pain, shortness of breath, increased pedal edema. Differential includes, but is not limited to, viral syndrome,  bronchitis including COPD exacerbation, pneumonia, reactive airway disease including asthma, CHF including exacerbation with or without pulmonary/interstitial edema, pneumothorax, ACS, thoracic trauma, and pulmonary embolism.  I have personally reviewed patient's records and note her hospitalization 5/23 - 04/24/2024 for hyponatremia.  Patient's presentation is most consistent with acute complicated illness / injury requiring diagnostic workup.  The patient is on the cardiac monitor to evaluate for evidence of arrhythmia and/or significant heart rate changes.  Laboratory results demonstrate hemoglobin 8.4 which is slightly lower than prior, sodium 130, negative troponin, mildly elevated BNP.  Chest x-ray consistent with vascular congestion.  Patient is tachypneic just lying in the bed.  Administer Morphine  for chest pain, IV Lasix , DuoNeb.  Given her tenuous status of hyponatremia with excessive fluid overload requiring diuresis, will consult hospitalist services for evaluation and admission.      FINAL CLINICAL IMPRESSION(S) / ED DIAGNOSES   Final diagnoses:  Dyspnea, unspecified type  Acute on chronic congestive heart failure, unspecified heart failure type (HCC)  Hyponatremia     Rx / DC Orders   ED Discharge Orders     None        Note:  This document was prepared using Dragon voice recognition software and may include unintentional dictation errors.   Einer Meals J, MD 05/18/24 0600

## 2024-05-18 NOTE — ED Notes (Signed)
 Pt ambulated to restroom with minimal assistance.

## 2024-05-18 NOTE — Progress Notes (Signed)
 Heart Failure Stewardship Pharmacy Note  PCP: Lyle San, MD PCP-Cardiologist: Constancia Delton, MD  HPI: Linda Brady is a 83 y.o. 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 On admission, BNP was 140.1 (BL ~100), HS-troponin was 14, ferritin is 5, TSAT is 5. Chest x-ray noted cardiomegaly with central congestion.   Pertinent cardiac history: LHC in 09/2020 noted mild nonobstructive CAD. Echo 09/2020 noted LVEF 55-60%. Echo 12/2021 noted LVEF of 65-70% with grade II diastolic dysfunction and moderate AS. Low risk stress test 10/2022. Echo 09/2023 noted LVEF 65-70%, grade II diastolic dysfunction, mild MR, and severe aortic stenosis. LHC 10/2023 noted mild nonobstructive CAD with preserved cardiac output and PAPI. Underwent TAVR 12/2023. Most recent echo 01/2024 noted LVEF of 60-65%, grade II diastolic dysfunction, mild MR, and mild to moderate AR though to be paravalvular.  Pertinent Lab Values: Creatinine  Date Value Ref Range Status  09/01/2014 0.91 0.60 - 1.30 mg/dL Final   Creatinine, Ser  Date Value Ref Range Status  05/17/2024 0.78 0.44 - 1.00 mg/dL Final   BUN  Date Value Ref Range Status  05/17/2024 11 8 - 23 mg/dL Final  63/87/5643 11 8 - 27 mg/dL Final  32/95/1884 9 7 - 18 mg/dL Final   Potassium  Date Value Ref Range Status  05/17/2024 3.9 3.5 - 5.1 mmol/L Final  09/01/2014 3.2 (L) 3.5 - 5.1 mmol/L Final   Sodium  Date Value Ref Range Status  05/17/2024 130 (L) 135 - 145 mmol/L Final  04/13/2024 130 (L) 134 - 144 mmol/L Final  09/01/2014 135 (L) 136 - 145 mmol/L Final   B Natriuretic Peptide  Date Value Ref Range Status  05/17/2024 140.1 (H) 0.0 - 100.0 pg/mL Final    Comment:    Performed at  [facility], [address] [place], [place] [address]   Magnesium   Date Value Ref Range Status  04/23/2024 2.1 1.7 - 2.4 mg/dL Final    Comment:    Performed at [facility], [address] [place], [place] [address]   TSH  Date Value Ref Range Status  04/23/2024 4.185 0.350 - 4.500 uIU/mL Final    Comment:    Performed by a 3rd Generation assay with a functional sensitivity of <=0.01 uIU/mL. Performed at [facility], [address] [place], [place] [address]   12/26/2023 4.030 0.450 - 4.500 uIU/mL Final    Vital Signs:  Temp:  [97.6 F (36.4 C)-98.2 F (36.8 C)] 97.6 F (36.4 C) (06/17 0845) Pulse Rate:  [65-80] 65 (06/17 0845) Cardiac Rhythm: Normal sinus rhythm (06/17 0107) Resp:  [13-24] 19 (06/17 0845) BP: (120-201)/(51-84) 144/62 (06/17 0845) SpO2:  [95 %-100 %] 100 % (06/17 0845) No intake or output data in the 24 hours ending 05/18/24 0909  Current Heart Failure Medications:  Loop diuretic: furosemide  40 mg IV BID Beta-Blocker: none ACEI/ARB/ARNI: none MRA: none SGLT2i: none Other: isosorbide  30 mg daily  Prior to admission Heart Failure Medications:  Loop diuretic: furosemide  20 mg daily + Kcl 40 meq daily Beta-Blocker: none ACEI/ARB/ARNI: none MRA: none SGLT2i: none Other: isosorbide  30 mg daily  Assessment: 1. Acute on chronic diastolic heart failure (LVEF 60-65%) with grade II diastolic dysfunction, due to NICM. NYHA class III symptoms.  -Symptoms: Patient reports persistent orthopnea and LEE.  Appetite is modest. -Volume: JVP does not appear overly elevated, though mild LEE present and patient has persistent orthopnea. BNP mildly up from baseline. Agree with furosemide  40 mg IV BID. -Hemodynamics: BP is mildly elevated and HR 70s. -SGLT2i: Consider adding Farxiga 10 mg daily. Will check cost. -MRA: Taking potassium supplements PTA and BP up. Consider adding spironolactone . -ARNI: Would optimize other evidence based  HFpEF therapy prior to initiation.   Plan: 1) Medication changes recommended at this time: -Consider adding Farxiga 10 mg daily -Consider adding spironolactone  25 mg daily -Consider IV iron replacement for CHF symptom management so long as CMRI is not planned  2) Patient assistance: -Pending  3) Education: - Patient has been educated on current HF medications and potential additions to HF medication regimen - Patient verbalizes understanding that over the next few months, these medication doses may change and more medications may be added to optimize HF regimen - Patient has been educated on basic disease state pathophysiology and goals of therapy  Medication Assistance / Insurance Benefits Check: Does the patient have prescription insurance?    Type of insurance plan:  Does the patient qualify for medication assistance through manufacturers or grants? Pending   Outpatient Pharmacy: Prior to admission outpatient pharmacy: [facility]      Please do not hesitate to reach out with questions or concerns,  Bevely Brush, PharmD, CPP, BCPS Heart Failure Pharmacist  Phone - [phone] 05/18/2024 11:22 AM

## 2024-05-19 ENCOUNTER — Inpatient Hospital Stay

## 2024-05-19 DIAGNOSIS — I5031 Acute diastolic (congestive) heart failure: Secondary | ICD-10-CM | POA: Diagnosis not present

## 2024-05-19 LAB — BASIC METABOLIC PANEL WITH GFR
Anion gap: 14 (ref 5–15)
BUN: 10 mg/dL (ref 8–23)
CO2: 30 mmol/L (ref 22–32)
Calcium: 9.4 mg/dL (ref 8.9–10.3)
Chloride: 88 mmol/L — ABNORMAL LOW (ref 98–111)
Creatinine, Ser: 0.95 mg/dL (ref 0.44–1.00)
GFR, Estimated: >60 mL/min (ref >60)
Glucose, Bld: 92 mg/dL (ref 70–99)
Potassium: 3.1 mmol/L — ABNORMAL LOW (ref 3.5–5.1)
Sodium: 132 mmol/L — ABNORMAL LOW (ref 135–145)

## 2024-05-19 MED ORDER — POTASSIUM CHLORIDE 20 MEQ PO PACK
40.0000 meq | PACK | Freq: Once | ORAL | Status: AC
  Administered 2024-05-19: 40 meq via ORAL
  Filled 2024-05-19: qty 2

## 2024-05-19 MED ORDER — ROPINIROLE HCL 1 MG PO TABS
0.5000 mg | ORAL_TABLET | Freq: Every day | ORAL | Status: DC
  Administered 2024-05-19: 0.5 mg via ORAL
  Filled 2024-05-19: qty 1

## 2024-05-19 MED ORDER — SPIRONOLACTONE 25 MG PO TABS
25.0000 mg | ORAL_TABLET | Freq: Every day | ORAL | Status: DC
  Administered 2024-05-19 – 2024-05-20 (×2): 25 mg via ORAL
  Filled 2024-05-19 (×2): qty 1

## 2024-05-19 MED ORDER — HALOPERIDOL LACTATE 5 MG/ML IJ SOLN
1.0000 mg | Freq: Four times a day (QID) | INTRAMUSCULAR | Status: DC | PRN
  Administered 2024-05-19: 2 mg via INTRAMUSCULAR
  Filled 2024-05-19: qty 1

## 2024-05-19 MED ORDER — ROPINIROLE HCL 1 MG PO TABS
0.5000 mg | ORAL_TABLET | Freq: Every day | ORAL | Status: DC | PRN
  Administered 2024-05-19 – 2024-05-20 (×2): 0.5 mg via ORAL
  Filled 2024-05-19 (×2): qty 1

## 2024-05-19 NOTE — Progress Notes (Signed)
  PROGRESS NOTE    Linda Brady  WUJ:[id number] DOB: 02/22/1941 DOA: 05/18/2024 PCP: Lyle San, MD  [id number]  LOS: 1 day   Brief hospital course:   Assessment & Plan: Linda Brady is a 83 y.o. 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    Acute on chronic HFpEF decompensation - Significant fluid overload on physical exam and imaging study - Echocardiogram was done less than 3 months ago, will not repeat. --cont IV lasix  40 BID --add spironolactone    Anginal-like chest pain History of nonobstructive CAD - Likely secondary to CHF decompensation, EKG negative for acute findings and troponin negative x 2, ACS ruled out - Review of recent cardiac cath done November 2024 showed nonobstructive CAD - Outpatient follow-up with cardiology for stress test --cont statin   HTN, uncontrolled - Likely secondary to fluid overload from CHF, improving after given Lasix  in the ED --cont Imdur  --cont IV lasix  --add spironolactone    Worsening of chronic normocytic anemia - Check iron study as patient is on Eliquis  - Patient denied any black tarry stool or blood in the stool --monitor Hgb    Hyponatremia - Acute on chronic, likely secondary to CHF, diuresis as above --monitor   COPD Chronic hypoxemic respiratory failure on 2L O2 - Stable --cont daily bronchodilators  PAF - In sinus rhythm - Continue Eliquis  --cont amiodarone    Hypothyroidism - Continue Synthroid    Deconditioning - PT evaluation  Hypokalemia --monitor and supplement PRN   DVT prophylaxis: On:Eliquis  Code Status: Full code  Family Communication:  Level of care: Telemetry Cardiac Dispo:    The patient is from: home Anticipated d/c is to: home Anticipated d/c date is: 1-2 days   Subjective and Interval History:  Pt reported having restless leg overnight and couldn't sleep.  Dyspnea improved.   Objective: Vitals:   05/19/24 0734 05/19/24 1142 05/19/24 1727 05/19/24 1939  BP: (!) 169/71 (!) 136/57 (!) 151/65 (!) 138/56  Pulse: 67 71 68 72  Resp: 18   18  Temp: 98 F (36.7 C) (!) 97.5 F (36.4 C) (!) 97.4 F (36.3 C) 98.2 F (36.8 C)  TempSrc:   Axillary Oral  SpO2: 100% 100% 99% 100%  Weight:      Height:        Intake/Output Summary (Last 24 hours) at 05/19/2024 2003 Last data filed at 05/19/2024 1832 Gross per 24 hour  Intake 1080 ml  Output 3750 ml  Net -2670 ml   Filed Weights   05/18/24 2119 05/19/24 0500  Weight: 89.7 kg 89.8 kg    Examination:   Constitutional: NAD, AAOx3 HEENT: conjunctivae and lids normal, EOMI CV: No cyanosis.   RESP: normal respiratory effort, on 3L Extremities: No effusions, edema in BLE SKIN: warm, dry Neuro: II - XII grossly intact.   Psych: Normal mood and affect.  Appropriate judgement and reason   Data Reviewed: I have personally reviewed labs and imaging studies  Time spent: 50 minutes  Garrison Kanner, MD [facility] If 7PM-7AM, please contact night-coverage 05/19/2024, 8:03 PM

## 2024-05-19 NOTE — Plan of Care (Signed)

## 2024-05-19 NOTE — Progress Notes (Signed)
 Heart Failure Nurse Navigator Progress Note  PCP: Lyle San, MD PCP-Cardiologist: Constancia Delton, MD Admission Diagnosis: Dyspnea, unspecified type Acute on Chronic congestive heart failure, unspecified heart failure type (HCC) Hypoatremia Admitted from: Home  Presentation:   Linda Brady presented with chest pain, shortness of breath, bilateral lower extremity swelling.  Patient with Hx: COPD, CHF on 2 L [place] oxygen at baseline.  Patient had to increase her oxygen to 3L.  Recent hospitalization for hyponatremia and her Lasix  was decreased in half.  BNP 140 & HS-Troponin-14.  ECHO/ LVEF: 01/16/2024 60-65%. Moderate concentric LV hypertrophy.  Grade II diastolic dysfunction (pseudo normalization).  Mod elevated pulmonary artery systolic pressure.  LA mildly dilated. Mild mitral valve regurgitation. Chest x-ray: Unchanged mild cardiomegaly.   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 Lorie Rook and Dr. Honey Lusty   Thyroid  disease      Social History   Socioeconomic History   Marital status: Widowed    Spouse name: Not on file   Number of children: Not on file   Years of education: Not on file   Highest education level: Not on file  Occupational History   Not on file  Tobacco Use   Smoking status: Former    Current packs/day: 0.50    Average packs/day: 0.5 packs/day for 12.0 years (6.0 ttl pk-yrs)    Types: Cigarettes   Smokeless tobacco: Never   Tobacco comments:    7-8 cigarettes a day  Vaping Use   Vaping status: Never Used  Substance and Sexual Activity   Alcohol use: Not Currently   Drug use: Not Currently   Sexual activity: Not Currently   Other Topics Concern   Not on file  Social History Narrative   Not on file   Social Drivers of Health   Financial Resource Strain: Low Risk  (04/27/2024)   Received [facility]   Overall Financial Resource Strain (CARDIA)    Difficulty of Paying Living Expenses: Not hard at all  Food Insecurity: No Food Insecurity (05/19/2024)   Hunger Vital Sign    Worried About Running Out of Food in the Last Year: Never true    Ran Out of Food in the Last Year: Never true  Transportation Needs: No Transportation Needs (05/19/2024)   PRAPARE - Administrator, Civil Service (Medical): No    Lack of Transportation (Non-Medical): No  Physical Activity: Not on file  Stress: Not on file  Social Connections: Patient Declined (05/19/2024)   Social Connection and Isolation Panel    Frequency of Communication with Friends and Family: Patient declined    Frequency of Social Gatherings with Friends and Family: Patient declined    Attends Religious Services: Patient declined    Database administrator or Organizations: Patient declined    Attends Banker Meetings: Patient declined    Marital Status: Patient declined  Recent Concern: Social Connections - Socially Isolated (05/18/2024)   Social Connection and Isolation Panel    Frequency of Communication with Friends and Family: More than three times a week    Frequency of Social Gatherings with Friends and  Family: Twice a week    Attends Religious Services: Never    Active Member of Clubs or Organizations: No    Attends Banker Meetings: Never    Marital Status: Widowed   Education Assessment and Provision:  Detailed education and instructions provided on heart failure disease management including the following:  Signs and symptoms of Heart Failure When to call the physician Importance of daily weights Low sodium diet Fluid restriction Medication management Anticipated future follow-up  appointments  Patient education given on each of the above topics.  Patient acknowledges understanding via teach back method and acceptance of all instructions.  Education Materials:  Living Better With Heart Failure Booklet, HF zone tool, & Daily Weight Tracker Tool.  Patient has scale at home: No-Patient says her daughter can get her one. Patient has pill box at home: Yes    High Risk Criteria for Readmission and/or Poor Patient Outcomes: Heart failure hospital admissions (last 6 months): 1  No Show rate: 6% Difficult social situation: Hearing Impairment- wears hearing aids Demonstrates medication adherence: Yes Primary Language: English Literacy level: Reading, Writing & Comprehension  Barriers of Care:   Patient has hearing impairment.  Has hearing aids if they are charged.  Considerations/Referrals:   Referral made to Heart Failure Pharmacist Stewardship: Yes Referral made to Heart Failure CSW/NCM TOC: No Referral made to Heart & Vascular TOC clinic: Yes. 05/26/24 @ 11:30 AM  Items for Follow-up on DC/TOC: Daily Weights Diet & Fluid Restrictions Continued Heart Failure Education  Celedonio Coil, RN, BSN [facility] Heart Failure Navigator Secure Chat Only

## 2024-05-19 NOTE — Progress Notes (Signed)
 Heart Failure Stewardship Pharmacy Note  PCP: Lyle San, MD PCP-Cardiologist: Constancia Delton, MD  HPI: Linda Brady is a 83 y.o. 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 On admission, BNP was 140.1 (BL ~100), HS-troponin was 14, ferritin is 5, TSAT is 5. Chest x-ray noted cardiomegaly with central congestion.   Pertinent cardiac history: LHC in 09/2020 noted mild nonobstructive CAD. Echo 09/2020 noted LVEF 55-60%. Echo 12/2021 noted LVEF of 65-70% with grade II diastolic dysfunction and moderate AS. Low risk stress test 10/2022. Echo 09/2023 noted LVEF 65-70%, grade II diastolic dysfunction, mild MR, and severe aortic stenosis. LHC 10/2023 noted mild nonobstructive CAD with preserved cardiac output and PAPI. Underwent TAVR 12/2023. Most recent echo 01/2024 noted LVEF of 60-65%, grade II diastolic dysfunction, mild MR, and mild to moderate AR though to be paravalvular.  Pertinent Lab Values: Creatinine  Date Value Ref Range Status  09/01/2014 0.91 0.60 - 1.30 mg/dL Final   Creatinine, Ser  Date Value Ref Range Status  05/19/2024 0.95 0.44 - 1.00 mg/dL Final   BUN  Date Value Ref Range Status  05/19/2024 10 8 - 23 mg/dL Final  12/45/8099 11 8 - 27 mg/dL Final  83/38/2505 9 7 - 18 mg/dL Final   Potassium  Date Value Ref Range Status  05/19/2024 3.1 (L) 3.5 - 5.1 mmol/L Final  09/01/2014 3.2 (L) 3.5 - 5.1 mmol/L Final   Sodium  Date Value Ref Range Status  05/19/2024 132 (L) 135 - 145 mmol/L Final  04/13/2024 130 (L) 134 - 144 mmol/L Final  09/01/2014 135 (L) 136 - 145 mmol/L Final   B Natriuretic Peptide  Date Value Ref Range Status  05/17/2024 140.1 (H) 0.0 - 100.0 pg/mL Final    Comment:    Performed at  [facility], [address] [place], [place] [address]   Magnesium   Date Value Ref Range Status  04/23/2024 2.1 1.7 - 2.4 mg/dL Final    Comment:    Performed at [facility], [address] [place], [place] [address]   TSH  Date Value Ref Range Status  04/23/2024 4.185 0.350 - 4.500 uIU/mL Final    Comment:    Performed by a 3rd Generation assay with a functional sensitivity of <=0.01 uIU/mL. Performed at [facility], [address] [place], [place] [address]   12/26/2023 4.030 0.450 - 4.500 uIU/mL Final    Vital Signs:  Temp:  [97.7 F (36.5 C)-98 F (36.7 C)] 98 F (36.7 C) (06/18 0734) Pulse Rate:  [62-78] 67 (06/18 0734) Cardiac Rhythm: Heart block;Bundle branch block (06/18 0733) Resp:  [16-21] 18 (06/18 0734) BP: (110-169)/(51-86) 169/71 (06/18 0734) SpO2:  [94 %-100 %] 100 % (06/18 0734) Weight:  [89.7 kg (197 lb 12 oz)-89.8 kg (197 lb 15.6 oz)] 89.8 kg (197 lb 15.6 oz) (06/18 0500)  Intake/Output Summary (Last 24 hours) at 05/19/2024 0954 Last data filed at 05/19/2024 0944 Gross per 24 hour  Intake --  Output 2450 ml  Net -2450 ml    Current Heart Failure Medications:  Loop diuretic: furosemide  40 mg IV BID Beta-Blocker: none ACEI/ARB/ARNI: none MRA: none SGLT2i: none Other: isosorbide  30 mg daily  Prior to admission Heart Failure Medications:  Loop diuretic: furosemide  20 mg daily + Kcl 40 meq daily Beta-Blocker:  none ACEI/ARB/ARNI: none MRA: none SGLT2i: none Other: isosorbide  30 mg daily  Assessment: 1. Acute on chronic diastolic heart failure (LVEF 60-65%) with grade II diastolic dysfunction, due to NICM. NYHA class III symptoms.  -Symptoms: Patient reports orthopnea is back to 2-pillow baseline and LEE is improved. Reports poor sleep last night due to RLS. Ambulated unit with only mild shortness of breath. Patient is eager to leave. -Volume: JVP does not appear overly elevated. Creatinine is stable. Agree with  furosemide  40 mg IV BID. Can consider oral diuretics tomorrow. -Hemodynamics: BP is mildly elevated and HR 70s. -SGLT2i: Would benefit from adding Farxiga 10 mg daily, however this appears to be cost prohibitive at this time.  -MRA: Taking potassium supplements PTA and BP up. Consider adding spironolactone . -ARNI: Would optimize other evidence based HFpEF therapy prior to initiation.   Plan: 1) Medication changes recommended at this time: -Consider adding spironolactone  25 mg daily -Consider IV iron replacement for CHF symptom management so long as CMRI is not planned  2) Patient assistance: -Copay for Elvina Hammers is 104.44 -Copay for Jardiance is 109.61  3) Education: - Patient has been educated on current HF medications and potential additions to HF medication regimen - Patient verbalizes understanding that over the next few months, these medication doses may change and more medications may be added to optimize HF regimen - Patient has been educated on basic disease state pathophysiology and goals of therapy  Medication Assistance / Insurance Benefits Check: Does the patient have prescription insurance?    Type of insurance plan:  Does the patient qualify for medication assistance through manufacturers or grants? Pending   Outpatient Pharmacy: Prior to admission outpatient pharmacy: [facility]   Is the patient willing to utilize a [facility] pharmacy at discharge?: Yes  Please do not hesitate to reach out with questions or concerns,  Bevely Brush, PharmD, CPP, BCPS Heart Failure Pharmacist  Phone - [phone] 05/19/2024 9:54 AM

## 2024-05-20 ENCOUNTER — Other Ambulatory Visit: Payer: Self-pay

## 2024-05-20 ENCOUNTER — Other Ambulatory Visit (HOSPITAL_COMMUNITY): Payer: Self-pay

## 2024-05-20 ENCOUNTER — Telehealth (HOSPITAL_COMMUNITY): Payer: Self-pay | Admitting: Pharmacy Technician

## 2024-05-20 DIAGNOSIS — I5031 Acute diastolic (congestive) heart failure: Secondary | ICD-10-CM | POA: Diagnosis not present

## 2024-05-20 LAB — BASIC METABOLIC PANEL WITH GFR
Anion gap: 11 (ref 5–15)
BUN: 14 mg/dL (ref 8–23)
CO2: 33 mmol/L — ABNORMAL HIGH (ref 22–32)
Calcium: 9.6 mg/dL (ref 8.9–10.3)
Chloride: 85 mmol/L — ABNORMAL LOW (ref 98–111)
Creatinine, Ser: 1 mg/dL (ref 0.44–1.00)
GFR, Estimated: 57 mL/min — ABNORMAL LOW (ref >60)
Glucose, Bld: 103 mg/dL — ABNORMAL HIGH (ref 70–99)
Potassium: 3.1 mmol/L — ABNORMAL LOW (ref 3.5–5.1)
Sodium: 129 mmol/L — ABNORMAL LOW (ref 135–145)

## 2024-05-20 LAB — CBC
HCT: 31.2 % — ABNORMAL LOW (ref 36.0–46.0)
Hemoglobin: 9.6 g/dL — ABNORMAL LOW (ref 12.0–15.0)
MCH: 25.2 pg — ABNORMAL LOW (ref 26.0–34.0)
MCHC: 30.8 g/dL (ref 30.0–36.0)
MCV: 81.9 fL (ref 80.0–100.0)
Platelets: 433 10*3/uL — ABNORMAL HIGH (ref 150–400)
RBC: 3.81 MIL/uL — ABNORMAL LOW (ref 3.87–5.11)
RDW: 17.1 % — ABNORMAL HIGH (ref 11.5–15.5)
WBC: 6 10*3/uL (ref 4.0–10.5)
nRBC: 0 % (ref 0.0–0.2)

## 2024-05-20 LAB — MAGNESIUM: Magnesium: 1.9 mg/dL (ref 1.7–2.4)

## 2024-05-20 MED ORDER — SPIRONOLACTONE 25 MG PO TABS: 25.0000 mg | ORAL_TABLET | Freq: Every day | ORAL | Status: DC

## 2024-05-20 MED ORDER — POTASSIUM CHLORIDE 20 MEQ PO PACK
40.0000 meq | PACK | Freq: Once | ORAL | Status: AC
  Administered 2024-05-20: 40 meq via ORAL
  Filled 2024-05-20: qty 2

## 2024-05-20 MED ORDER — POTASSIUM CHLORIDE CRYS ER 20 MEQ PO TBCR: 20.0000 meq | EXTENDED_RELEASE_TABLET | Freq: Every day | ORAL | Status: DC

## 2024-05-20 MED ORDER — SPIRONOLACTONE 25 MG PO TABS
25.0000 mg | ORAL_TABLET | Freq: Every day | ORAL | 2 refills | Status: DC
  Filled 2024-05-20 (×3): qty 30, 30d supply, fill #0

## 2024-05-20 MED ORDER — ALPRAZOLAM 0.25 MG PO TABS
0.2500 mg | ORAL_TABLET | Freq: Two times a day (BID) | ORAL | Status: DC | PRN
  Administered 2024-05-20: 0.25 mg via ORAL
  Filled 2024-05-20: qty 1

## 2024-05-20 NOTE — TOC Transition Note (Signed)
 Transition of Care [facility]) - Discharge Note   Patient Details  Name: Linda Brady MRN: [id number] Date of Birth: May 02, 1941  Transition of [facility] CM/SW Contact:  Abdullah Rizzi C Eleni Frank, RN Phone Number: 05/20/2024, 4:10 PM   Clinical Narrative:    Risk assessement completed   Admitted for: sob, leg swelling r/t CHF Admitted from: home alone Pharmacy: [facility] [address]  Current home health/prior home health/DME:home oxygen Transportation: daughter HH: Patient was previously with [place]   New PT/ OT rec; HH arranged via [facility]   TOC signing off.      Final next level of care: Home w Home Health Services Barriers to Discharge: Barriers Resolved   Patient Goals and CMS Choice Patient states their goals for this hospitalization and ongoing recovery are:: [facility] CMS Medicare.gov Compare Post Acute Care list provided to:: Patient Represenative (must comment)        Discharge Placement                    Patient and family notified of of transfer: 05/20/24  Discharge Plan and Services Additional resources added to the After Visit Summary for                  DME Arranged: Oxygen DME Agency: [facility]       HH Arranged: PT, OT HH Agency: [facility] Date [facility] Agency Contacted: 05/20/24 Time HH Agency Contacted: 1609 Representative spoke with at [facility] Agency: Louanna Rouse  Social Drivers of Health (SDOH) Interventions SDOH Screenings   Food Insecurity: No Food Insecurity (05/19/2024)  Housing: Unknown (05/19/2024)  Transportation Needs: No Transportation Needs (05/19/2024)  Utilities: Not At Risk (05/19/2024)  Financial Resource Strain: Low Risk  (04/27/2024)   Received from [facility]  Social Connections: Patient Declined (05/19/2024)  Recent Concern: Social Connections - Socially Isolated (05/18/2024)  Tobacco Use: Medium Risk (05/17/2024)     Readmission Risk Interventions    05/20/2024    4:06 PM 10/28/2023    2:54 PM  Readmission  Risk Prevention Plan  Transportation Screening Complete Complete  PCP or Specialist Appt within 5-7 Days  Complete  PCP or Specialist Appt within 3-5 Days Complete   Home Care Screening  Complete  Medication Review (RN CM)  Complete  HRI or Home Care Consult Complete   Social Work Consult for Recovery Care Planning/Counseling Complete   Palliative Care Screening Not Applicable   Medication Review Oceanographer) Complete

## 2024-05-20 NOTE — Plan of Care (Signed)

## 2024-05-20 NOTE — Progress Notes (Signed)
 ReDS Vest / Clip - 05/20/24 1132       ReDS Vest / Clip   Station Marker A    Ruler Value 27    ReDS Value Range Low volume    ReDS Actual Value 26    Anatomical Comments 2nd check         I personally weighed patient on a standing scale.  Her weight was 174.8 lbs right before ReDs test  1st check 27, 2nd check 26  Celedonio Coil, RN, BSN [facility] Heart Failure Navigator Secure Chat Only

## 2024-05-20 NOTE — Care Management Important Message (Signed)
 Important Message  Patient Details  Name: Linda Brady MRN: [id number] Date of Birth: December 10, 1940   Important Message Given:  Yes - Medicare IM     Anise Kerns 05/20/2024, 1:29 PM

## 2024-05-20 NOTE — Discharge Summary (Signed)
 Physician Discharge Summary   Linda Brady  female DOB: 1941-02-11  FMW:[id number]  PCP: Valora Agent, MD  Admit date: 05/18/2024 Discharge date: 05/20/2024  Admitted From: home Disposition:  home Home Health: Yes CODE STATUS: Full code  Discharge Instructions     Diet - low sodium heart healthy   Complete by: As directed       Hospital Course:  For full details, please see H&P, progress notes, consult notes and ancillary notes.  Briefly,  Linda Brady is a 83 y.o. 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    Acute on chronic HFpEF exacerbation - presented with Significant fluid overload on physical exam and imaging study - Echocardiogram was done less than 3 months ago, so not repeated. --received IV lasix  40 BID for 3 days with improvement of symptoms.  Resume home oral lasix  20 mg daily after discharge. --cont home aldactone  25 mg daily.   Anginal-like chest pain History of nonobstructive CAD - Likely secondary to CHF decompensation, EKG negative for acute findings and troponin negative x 2, ACS ruled out - Review of recent cardiac cath done November 2024 showed nonobstructive CAD - Outpatient follow-up with cardiology for stress test --cont statin    HTN, uncontrolled - Likely secondary to fluid overload from CHF, improving after given Lasix  in the ED --cont Imdur , lasix  and aldactone    Worsening of chronic normocytic anemia Iron def --Hgb stable in 9's  Chronic Hyponatremia --Na low 130's   COPD Chronic hypoxemic respiratory failure on 2L O2 - Stable --cont daily bronchodilators   PAF - In sinus rhythm - Continue Eliquis  --cont  amiodarone    Hypothyroidism - Continue Synthroid    Hypokalemia --monitored and supplemented PRN --reduce home potassium supplement from 40 to 20 mEq daily.   Discharge Diagnoses:  Principal Problem:   Acute CHF (congestive heart failure) (HCC) Active Problems:   CHF (congestive heart failure) (HCC)   30 Day Unplanned Readmission Risk Score    Flowsheet Row ED to Hosp-Admission (Current) from 05/18/2024 in [facility]  30 Day Unplanned Readmission Risk Score (%) 23.8 Filed at 05/20/2024 1200    This score is the patient's risk of an unplanned readmission within 30 days of being discharged (0 -100%). The score is based on dignosis, age, lab data, medications, orders, and past utilization.   Low:  0-14.9   Medium: 15-21.9   High: 22-29.9   Extreme: 30 and above         Discharge Instructions:  Allergies as of 05/20/2024       Reactions   Naproxen Sodium Anaphylaxis        Medication List     TAKE these medications    ALPRAZolam  0.25 MG tablet Commonly known as: XANAX  Take 0.25 mg by mouth at bedtime as needed for sleep.   amiodarone  200 MG tablet Commonly known as: PACERONE  Take 1 tablet (200 mg total) by mouth daily.   atorvastatin  40 MG tablet Commonly known as: LIPITOR Take 1 tablet (40 mg total) by mouth daily.   cholecalciferol  25 MCG (1000 UNIT) tablet Commonly known as: VITAMIN D3 Take 1,000 Units by mouth daily.   Combivent  Respimat 20-100 MCG/ACT Aers respimat Generic drug: Ipratropium-Albuterol  Inhale 1 puff into the lungs  every 6 (six) hours as needed for wheezing.   cyanocobalamin  1000 MCG tablet Commonly known as: VITAMIN B12 Take 1,000 mcg by mouth daily.   Eliquis  5 MG Tabs tablet Generic drug: apixaban  Take 5 mg by mouth 2 (two) times daily.   FLUoxetine  10 MG capsule Commonly known as: PROZAC  Take 2 capsules (20 mg total) by mouth daily as needed (anxiety).   furosemide  20 MG tablet Commonly known as:  LASIX  Take 1 tablet (20 mg total) by mouth daily.   isosorbide  mononitrate 30 MG 24 hr tablet Commonly known as: IMDUR  Take 1 tablet (30 mg total) by mouth daily.   levalbuterol  1.25 MG/3ML nebulizer solution Commonly known as: XOPENEX  Take 1.25 mg by nebulization every 6 (six) hours as needed.   levothyroxine  75 MCG tablet Commonly known as: SYNTHROID  Take 75 mcg by mouth daily before breakfast.   mometasone -formoterol  200-5 MCG/ACT Aero Commonly known as: DULERA  Inhale 2 puffs into the lungs 2 (two) times daily.   montelukast  10 MG tablet Commonly known as: SINGULAIR  Take 1 tablet by mouth at bedtime.   pantoprazole  40 MG tablet Commonly known as: PROTONIX  Take 1 tablet (40 mg total) by mouth 2 (two) times daily.   potassium chloride  SA 20 MEQ tablet Commonly known as: KLOR-CON  M Take 1 tablet (20 mEq total) by mouth daily. Reduced from 2 tabs. What changed:  how much to take additional instructions   rOPINIRole  0.25 MG tablet Commonly known as: REQUIP  Take 1 tablet (0.25 mg total) by mouth at bedtime.   spironolactone  25 MG tablet Commonly known as: ALDACTONE  Take 1 tablet (25 mg total) by mouth daily. Start taking on: May 21, 2024   sucralfate  1 g tablet Commonly known as: Carafate  Take 1 tablet (1 g total) by mouth 4 (four) times daily.         Follow-up Information     [facility]. Go on 05/26/2024.   Specialty: Cardiology Why: Hospital Follow-Up 05/26/24 @ 11:30 Please bring all medications to follow-up appointment [facility], [address] Second Floor Free Valet Parking at the door. Contact information: [address] [place]  [address] [phone]                Allergies  Allergen Reactions   Naproxen Sodium Anaphylaxis     The results of significant diagnostics from this hospitalization (including imaging, microbiology, ancillary and laboratory)  are listed below for reference.   Consultations:   Procedures/Studies: DG Chest 1 View Result Date: 05/19/2024 CLINICAL DATA:  CHF.  Shortness of breath. EXAM: CHEST  1 VIEW COMPARISON:  05/17/2024 FINDINGS: Unchanged mild cardiomegaly. No pulmonary vascular congestion. Mild bibasilar atelectasis. Lungs otherwise clear. Vascular stent again noted in the RIGHT axilla. Prosthetic aortic valve again seen. IMPRESSION: Unchanged mild cardiomegaly. Electronically Signed   By: Aliene Lloyd M.D.   On: 05/19/2024 09:00   DG Chest Port 1 View Result Date: 05/17/2024 CLINICAL DATA:  Shortness of breath EXAM: PORTABLE CHEST 1 VIEW COMPARISON:  04/22/2024 FINDINGS: Cardiomegaly with central congestion. Valve prosthesis. Atelectasis or scarring at the bases. No pleural effusion or pneumothorax. IMPRESSION: Cardiomegaly with central congestion. Atelectasis or scarring at the bases. Electronically Signed   By: Luke Bun M.D.   On: 05/17/2024 19:59   CT Angio Chest PE W and/or Wo Contrast Result Date: 04/23/2024 CLINICAL DATA:  Shortness of breath and right lower quadrant pain EXAM: CT ANGIOGRAPHY CHEST CT ABDOMEN AND PELVIS WITH CONTRAST TECHNIQUE:  Multidetector CT imaging of the chest was performed using the standard protocol during bolus administration of intravenous contrast. Multiplanar CT image reconstructions and MIPs were obtained to evaluate the vascular anatomy. Multidetector CT imaging of the abdomen and pelvis was performed using the standard protocol during bolus administration of intravenous contrast. RADIATION DOSE REDUCTION: This exam was performed according to the departmental dose-optimization program which includes automated exposure control, adjustment of the mA and/or kV according to patient size and/or use of iterative reconstruction technique. CONTRAST:  OMNIPAQUE  IOHEXOL  350 MG/ML SOLN COMPARISON:  Chest CTA 10/28/2023 FINDINGS: CTA CHEST FINDINGS Cardiovascular: Satisfactory  opacification of the pulmonary arteries to the segmental level. No evidence of pulmonary embolism. Enlarged heart with thickened left ventricle. Unchanged Transcatheter aortic valve replacement. Extensive atheromatous calcification of the aorta and great vessels. Mediastinum/Nodes: Negative for mass or adenopathy. Small hiatal hernia with no esophageal thickening. Lungs/Pleura: Generalized airway thickening with intermittent tracheal and bronchial narrowing compatible with tracheal bronchomalacia. Bilateral lower lobe bronchi are collapsed and intermittently opacified with mild hazy density and volume loss at the lung bases where there is streaky scarring. There is no edema, acute airspace disease, effusion, or pneumothorax. Musculoskeletal: No acute or aggressive finding Review of the MIP images confirms the above findings. CT ABDOMEN and PELVIS FINDINGS Hepatobiliary: No focal liver abnormality.Cholecystectomy. No biliary dilatation Pancreas: Unremarkable. Spleen: Unremarkable. Adrenals/Urinary Tract: Negative adrenals. No hydronephrosis or stone. Bilateral renal cystic densities. No worrisome renal lesion when accounting for areas of motion artifact and cyst distortion. Unremarkable bladder. Stomach/Bowel:  No obstruction. No visible bowel inflammation. Vascular/Lymphatic: No acute vascular abnormality. Extensive atheromatous calcification. No mass or adenopathy. Reproductive:Hysterectomy. Other: No ascites or pneumoperitoneum. Musculoskeletal: No acute abnormalities. 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 Review of the MIP images confirms the above findings. IMPRESSION: No acute finding in the chest or abdomen. 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 Chronic scarring and atelectasis in the lower lungs. Atherosclerosis. Electronically Signed   By: Dorn Roulette M.D.   On: 04/23/2024 05:12   CT ABDOMEN PELVIS W CONTRAST Result Date: 04/23/2024 CLINICAL DATA:   Shortness of breath and right lower quadrant pain EXAM: CT ANGIOGRAPHY CHEST CT ABDOMEN AND PELVIS WITH CONTRAST TECHNIQUE: Multidetector CT imaging of the chest was performed using the standard protocol during bolus administration of intravenous contrast. Multiplanar CT image reconstructions and MIPs were obtained to evaluate the vascular anatomy. Multidetector CT imaging of the abdomen and pelvis was performed using the standard protocol during bolus administration of intravenous contrast. RADIATION DOSE REDUCTION: This exam was performed according to the departmental dose-optimization program which includes automated exposure control, adjustment of the mA and/or kV according to patient size and/or use of iterative reconstruction technique. CONTRAST:  OMNIPAQUE  IOHEXOL  350 MG/ML SOLN COMPARISON:  Chest CTA 10/28/2023 FINDINGS: CTA CHEST FINDINGS Cardiovascular: Satisfactory opacification of the pulmonary arteries to the segmental level. No evidence of pulmonary embolism. Enlarged heart with thickened left ventricle. Unchanged Transcatheter aortic valve replacement. Extensive atheromatous calcification of the aorta and great vessels. Mediastinum/Nodes: Negative for mass or adenopathy. Small hiatal hernia with no esophageal thickening. Lungs/Pleura: Generalized airway thickening with intermittent tracheal and bronchial narrowing compatible with tracheal bronchomalacia. Bilateral lower lobe bronchi are collapsed and intermittently opacified with mild hazy density and volume loss at the lung bases where there is streaky scarring. There is no edema, acute airspace disease, effusion, or pneumothorax. Musculoskeletal: No acute or aggressive finding Review of the MIP images confirms the above findings. CT ABDOMEN and PELVIS  FINDINGS Hepatobiliary: No focal liver abnormality.Cholecystectomy. No biliary dilatation Pancreas: Unremarkable. Spleen: Unremarkable. Adrenals/Urinary Tract: Negative adrenals. No  hydronephrosis or stone. Bilateral renal cystic densities. No worrisome renal lesion when accounting for areas of motion artifact and cyst distortion. Unremarkable bladder. Stomach/Bowel:  No obstruction. No visible bowel inflammation. Vascular/Lymphatic: No acute vascular abnormality. Extensive atheromatous calcification. No mass or adenopathy. Reproductive:Hysterectomy. Other: No ascites or pneumoperitoneum. Musculoskeletal: No acute abnormalities. 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 Review of the MIP images confirms the above findings. IMPRESSION: No acute finding in the chest or abdomen. 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 Chronic scarring and atelectasis in the lower lungs. Atherosclerosis. Electronically Signed   By: Dorn Roulette M.D.   On: 04/23/2024 05:12   DG Chest Port 1 View Result Date: 04/22/2024 CLINICAL DATA:  Chest pain and shortness of breath EXAM: PORTABLE CHEST 1 VIEW COMPARISON:  03/27/2024 FINDINGS: Cardiac shadow is stable. Prior TAVR is seen. Aortic calcifications noted. Mild vascular congestion is noted. No focal infiltrate or effusion is seen. No bony abnormality is noted. Mild scarring is noted in the bases. IMPRESSION: Chronic changes. Mild vascular congestion. Electronically Signed   By: Oneil Devonshire M.D.   On: 04/22/2024 23:04      Labs: BNP (last 3 results) Recent Labs    11/04/23 0859 03/27/24 1410 05/17/24 1935  BNP 132.4* 99.7 140.1*   Basic Metabolic Panel: Recent Labs  Lab 05/17/24 1935 05/19/24 0440 05/20/24 0430  NA 130* 132* 129*  K 3.9 3.1* 3.1*  CL 96* 88* 85*  CO2 24 30 33*  GLUCOSE 110* 92 103*  BUN 11 10 14   CREATININE 0.78 0.95 1.00  CALCIUM  8.9 9.4 9.6  MG  --   --  1.9   Liver Function Tests: Recent Labs  Lab 05/17/24 1935  AST 42*  ALT 35  ALKPHOS 67  BILITOT 0.7  PROT 6.8  ALBUMIN 3.4*   No results for input(s): LIPASE, AMYLASE in the last 168 hours. No results for  input(s): AMMONIA in the last 168 hours. CBC: Recent Labs  Lab 05/17/24 1935 05/20/24 0430  WBC 7.5 6.0  HGB 8.4* 9.6*  HCT 26.4* 31.2*  MCV 80.0 81.9  PLT 385 433*   Cardiac Enzymes: No results for input(s): CKTOTAL, CKMB, CKMBINDEX, TROPONINI in the last 168 hours. BNP: Invalid input(s): POCBNP CBG: No results for input(s): GLUCAP in the last 168 hours. D-Dimer No results for input(s): DDIMER in the last 72 hours. Hgb A1c No results for input(s): HGBA1C in the last 72 hours. Lipid Profile No results for input(s): CHOL, HDL, LDLCALC, TRIG, CHOLHDL, LDLDIRECT in the last 72 hours. Thyroid  function studies No results for input(s): TSH, T4TOTAL, T3FREE, THYROIDAB in the last 72 hours.  Invalid input(s): FREET3 Anemia work up Recent Labs    05/18/24 0009 05/18/24 2122  FERRITIN 5*  --   TIBC 511*  --   IRON 23*  --   RETICCTPCT  --  2.2   Urinalysis    Component Value Date/Time   COLORURINE COLORLESS (A) 05/18/2024 0320   APPEARANCEUR CLEAR (A) 05/18/2024 0320   APPEARANCEUR Hazy 11/12/2012 1355   LABSPEC 1.003 (L) 05/18/2024 0320   LABSPEC 1.005 11/12/2012 1355   PHURINE 7.0 05/18/2024 0320   GLUCOSEU NEGATIVE 05/18/2024 0320   GLUCOSEU Negative 11/12/2012 1355   HGBUR NEGATIVE 05/18/2024 0320   BILIRUBINUR NEGATIVE 05/18/2024 0320   BILIRUBINUR Negative 11/12/2012 1355   KETONESUR NEGATIVE 05/18/2024 0320   PROTEINUR NEGATIVE  05/18/2024 0320   NITRITE NEGATIVE 05/18/2024 0320   LEUKOCYTESUR NEGATIVE 05/18/2024 0320   LEUKOCYTESUR Trace 11/12/2012 1355   Sepsis Labs Recent Labs  Lab 05/17/24 1935 05/20/24 0430  WBC 7.5 6.0   Microbiology No results found for this or any previous visit (from the past 240 hours).   Total time spend on discharging this patient, including the last patient exam, discussing the hospital stay, instructions for ongoing care as it relates to all pertinent caregivers, as well as  preparing the medical discharge records, prescriptions, and/or referrals as applicable, is 35 minutes.    Ellouise Haber, MD  [facility] 05/20/2024, 3:23 PM

## 2024-05-20 NOTE — Telephone Encounter (Signed)
 Pharmacy Patient Advocate Encounter  Insurance verification completed.    The patient is insured through [employer].     Ran test claim for Entresto tablets and the current 30 day co-pay is $122.79.   This test claim was processed through [facility]- copay amounts may vary at other pharmacies due to pharmacy/plan contracts, or as the patient moves through the different stages of their insurance plan.

## 2024-05-20 NOTE — Progress Notes (Signed)
 Heart Failure Stewardship Pharmacy Note  PCP: Lyle San, MD PCP-Cardiologist: Constancia Delton, MD  HPI: Linda Brady is a 83 y.o. 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 On admission, BNP was 140.1 (BL ~100), HS-troponin was 14, ferritin is 5, TSAT is 5. Chest x-ray noted cardiomegaly with central congestion.   Pertinent cardiac history: LHC in 09/2020 noted mild nonobstructive CAD. Echo 09/2020 noted LVEF 55-60%. Echo 12/2021 noted LVEF of 65-70% with grade II diastolic dysfunction and moderate AS. Low risk stress test 10/2022. Echo 09/2023 noted LVEF 65-70%, grade II diastolic dysfunction, mild MR, and severe aortic stenosis. LHC 10/2023 noted mild nonobstructive CAD with preserved cardiac output and PAPI. Underwent TAVR 12/2023. Most recent echo 01/2024 noted LVEF of 60-65%, grade II diastolic dysfunction, mild MR, and mild to moderate AR though to be paravalvular.  Pertinent Lab Values: Creatinine  Date Value Ref Range Status  09/01/2014 0.91 0.60 - 1.30 mg/dL Final   Creatinine, Ser  Date Value Ref Range Status  05/20/2024 1.00 0.44 - 1.00 mg/dL Final   BUN  Date Value Ref Range Status  05/20/2024 14 8 - 23 mg/dL Final  96/03/5408 11 8 - 27 mg/dL Final  81/19/1478 9 7 - 18 mg/dL Final   Potassium  Date Value Ref Range Status  05/20/2024 3.1 (L) 3.5 - 5.1 mmol/L Final  09/01/2014 3.2 (L) 3.5 - 5.1 mmol/L Final   Sodium  Date Value Ref Range Status  05/20/2024 129 (L) 135 - 145 mmol/L Final  04/13/2024 130 (L) 134 - 144 mmol/L Final  09/01/2014 135 (L) 136 - 145 mmol/L Final   B Natriuretic Peptide  Date Value Ref Range Status  05/17/2024 140.1 (H) 0.0 - 100.0 pg/mL Final    Comment:    Performed at  [facility], [address] [place], [place] [address]   Magnesium   Date Value Ref Range Status  05/20/2024 1.9 1.7 - 2.4 mg/dL Final    Comment:    Performed at [facility], [address] [place], [place] [address]   TSH  Date Value Ref Range Status  04/23/2024 4.185 0.350 - 4.500 uIU/mL Final    Comment:    Performed by a 3rd Generation assay with a functional sensitivity of <=0.01 uIU/mL. Performed at [facility], [address] [place], [place] [address]   12/26/2023 4.030 0.450 - 4.500 uIU/mL Final    Vital Signs:  Temp:  [97.4 F (36.3 C)-98.6 F (37 C)] 97.5 F (36.4 C) (06/19 0733) Pulse Rate:  [68-74] 74 (06/19 0733) Cardiac Rhythm: Normal sinus rhythm (06/19 0800) Resp:  [18] 18 (06/19 0400) BP: (138-159)/(56-70) 159/70 (06/19 0733) SpO2:  [99 %-100 %] 100 % (06/19 0733) Weight:  [79.3 kg (174 lb 12.8 oz)-91.3 kg (201 lb 4.5 oz)] 79.3 kg (174 lb 12.8 oz) (06/19 1129)  Intake/Output Summary (Last 24 hours) at 05/20/2024 1219 Last data filed at 05/20/2024 1204 Gross per 24 hour  Intake 1080 ml  Output 2300 ml  Net -1220 ml   Current Heart Failure Medications:  Loop diuretic: furosemide  40 mg IV BID Beta-Blocker: none ACEI/ARB/ARNI: none MRA: spironolactone  25 mg daily SGLT2i: none Other: isosorbide  30 mg daily  Prior to admission Heart Failure Medications:  Loop diuretic: furosemide  20 mg daily + Kcl 40 meq  daily Beta-Blocker: none ACEI/ARB/ARNI: none MRA: none SGLT2i: none Other: isosorbide  30 mg daily  Assessment: 1. Acute on chronic diastolic heart failure (LVEF 60-65%) with grade II diastolic dysfunction, due to NICM. NYHA class III symptoms.  -Symptoms: Patient reports orthopnea is back to 2-pillow baseline and LEE is improved. Coughing up phlegm. Reports awaking around 2 am and experiencing anxiety accompanied by shortness of breath. Reports abdominal tightness -Volume: Creatinine is stable, but BUN is trending  up. Patient has contraction alkalosis. REDS today showed euvolemi (26%). Recommend stopping IV diuresis and starting oral furosemide  tomorrow. -Hemodynamics: BP is mildly elevated and HR 70s. -SGLT2i: Would benefit from adding Farxiga 10 mg daily, however this appears to be cost prohibitive at this time.  -MRA: Continue spironolactone  25 mg daily. Potassium is low, consider adding 40 meq once today. -ARNI: Entresto will be cost prohibitive without a grant. Can consider adding losartan 25 mg daily.  Plan: 1) Medication changes recommended at this time: -Consider transitioning to oral furosemide  tomorrow. -Consider adding losartan 25 mg daily -Consider IV iron replacement for CHF symptom management so long as CMRI is not planned  2) Patient assistance: -Copay for Elvina Hammers is 104.44 -Copay for Jardiance is 109.61  3) Education: - Patient has been educated on current HF medications and potential additions to HF medication regimen - Patient verbalizes understanding that over the next few months, these medication doses may change and more medications may be added to optimize HF regimen - Patient has been educated on basic disease state pathophysiology and goals of therapy  Medication Assistance / Insurance Benefits Check: Does the patient have prescription insurance?    Type of insurance plan:  Does the patient qualify for medication assistance through manufacturers or grants? Pending   Outpatient Pharmacy: Prior to admission outpatient pharmacy: [facility]   Is the patient willing to utilize a [facility] pharmacy at discharge?: Yes  Please do not hesitate to reach out with questions or concerns,  Bevely Brush, PharmD, CPP, BCPS Heart Failure Pharmacist  Phone - [phone] 05/20/2024 12:19 PM

## 2024-05-25 ENCOUNTER — Ambulatory Visit: Payer: Medicare Other

## 2024-05-25 ENCOUNTER — Other Ambulatory Visit (HOSPITAL_COMMUNITY): Payer: Medicare Other

## 2024-05-25 ENCOUNTER — Telehealth: Payer: Self-pay | Admitting: Family

## 2024-05-25 NOTE — Telephone Encounter (Signed)
 Called to confirm/remind patient of their appointment at the [facility] on 05/26/24.   Appointment:   [x] Confirmed  [] Left mess   [] No answer/No voice mail  [] VM Full/unable to leave message  [] Phone not in service  Patient reminded to bring all medications and/or complete list.  Confirmed patient has transportation. Gave directions, instructed to utilize valet parking.

## 2024-05-26 ENCOUNTER — Other Ambulatory Visit
Admission: RE | Admit: 2024-05-26 | Discharge: 2024-05-26 | Disposition: A | Discharge status: Home / Self Care | Source: Ambulatory Visit | Attending: Family | Admitting: Family

## 2024-05-26 ENCOUNTER — Ambulatory Visit (HOSPITAL_BASED_OUTPATIENT_CLINIC_OR_DEPARTMENT_OTHER): Admitting: Family

## 2024-05-26 ENCOUNTER — Encounter: Payer: Self-pay | Admitting: Family

## 2024-05-26 ENCOUNTER — Ambulatory Visit: Payer: Self-pay | Admitting: Family

## 2024-05-26 VITALS — BP 159/69 | HR 68 | Wt 180.0 lb

## 2024-05-26 DIAGNOSIS — E785 Hyperlipidemia, unspecified: Secondary | ICD-10-CM | POA: Insufficient documentation

## 2024-05-26 DIAGNOSIS — J449 Chronic obstructive pulmonary disease, unspecified: Secondary | ICD-10-CM | POA: Diagnosis not present

## 2024-05-26 DIAGNOSIS — I480 Paroxysmal atrial fibrillation: Secondary | ICD-10-CM | POA: Diagnosis not present

## 2024-05-26 DIAGNOSIS — Z952 Presence of prosthetic heart valve: Secondary | ICD-10-CM | POA: Insufficient documentation

## 2024-05-26 DIAGNOSIS — I350 Nonrheumatic aortic (valve) stenosis: Secondary | ICD-10-CM | POA: Diagnosis not present

## 2024-05-26 DIAGNOSIS — Z79899 Other long term (current) drug therapy: Secondary | ICD-10-CM | POA: Insufficient documentation

## 2024-05-26 DIAGNOSIS — I10 Essential (primary) hypertension: Secondary | ICD-10-CM | POA: Diagnosis not present

## 2024-05-26 DIAGNOSIS — I5032 Chronic diastolic (congestive) heart failure: Secondary | ICD-10-CM | POA: Insufficient documentation

## 2024-05-26 DIAGNOSIS — E782 Mixed hyperlipidemia: Secondary | ICD-10-CM

## 2024-05-26 DIAGNOSIS — G2581 Restless legs syndrome: Secondary | ICD-10-CM | POA: Insufficient documentation

## 2024-05-26 DIAGNOSIS — J9611 Chronic respiratory failure with hypoxia: Secondary | ICD-10-CM | POA: Insufficient documentation

## 2024-05-26 DIAGNOSIS — I110 Hypertensive heart disease with heart failure: Secondary | ICD-10-CM | POA: Insufficient documentation

## 2024-05-26 DIAGNOSIS — E039 Hypothyroidism, unspecified: Secondary | ICD-10-CM | POA: Insufficient documentation

## 2024-05-26 DIAGNOSIS — I2510 Atherosclerotic heart disease of native coronary artery without angina pectoris: Secondary | ICD-10-CM | POA: Diagnosis not present

## 2024-05-26 DIAGNOSIS — I428 Other cardiomyopathies: Secondary | ICD-10-CM | POA: Diagnosis not present

## 2024-05-26 DIAGNOSIS — Z9981 Dependence on supplemental oxygen: Secondary | ICD-10-CM | POA: Insufficient documentation

## 2024-05-26 LAB — BASIC METABOLIC PANEL WITH GFR
Anion gap: 12 (ref 5–15)
BUN: 13 mg/dL (ref 8–23)
CO2: 26 mmol/L (ref 22–32)
Calcium: 9.4 mg/dL (ref 8.9–10.3)
Chloride: 89 mmol/L — ABNORMAL LOW (ref 98–111)
Creatinine, Ser: 0.87 mg/dL (ref 0.44–1.00)
GFR, Estimated: >60 mL/min (ref >60)
Glucose, Bld: 114 mg/dL — ABNORMAL HIGH (ref 70–99)
Potassium: 3.6 mmol/L (ref 3.5–5.1)
Sodium: 127 mmol/L — ABNORMAL LOW (ref 135–145)

## 2024-05-26 LAB — LIPID PANEL
Cholesterol: 182 mg/dL (ref 0–200)
HDL: 92 mg/dL (ref >40)
LDL Cholesterol: 83 mg/dL (ref 0–99)
Total CHOL/HDL Ratio: 2 ratio
Triglycerides: 34 mg/dL (ref <150)
VLDL: 7 mg/dL (ref 0–40)

## 2024-05-26 MED ORDER — DAPAGLIFLOZIN PROPANEDIOL 10 MG PO TABS: 10.0000 mg | ORAL_TABLET | Freq: Every day | ORAL | 11 refills | Status: AC

## 2024-05-26 NOTE — Patient Instructions (Addendum)
  It was a pleasure meeting you today!  Medication Changes:  START Farxiga 10 mg daily.  Lab Work:  Go over to the [facility]. Go pass the gift shop and have your blood work completed.  We will only call you if the results are abnormal or if the provider would like to make medication changes.    Special Instructions // Education:  Continue weighing daily and call for an overnight weight gain of 3 pounds or more or a weekly weight gain of more than 5 pounds.  Follow-Up in: 3 weeks  At the [facility], you and your health needs are our priority. We have a designated team specialized in the treatment of Heart Failure. This Care Team includes your primary Heart Failure Specialized Cardiologist (physician), Advanced Practice Providers (APPs- Physician Assistants and Nurse Practitioners), and Pharmacist who all work together to provide you with the care you need, when you need it.   You may see any of the following providers on your designated Care Team at your next follow up:  Dr. Toribio Fuel Dr. Ezra Shuck Dr. Ria Commander Dr. Odis Brownie Ellouise Class, FNP Jaun Bash, RPH-CPP  Please be sure to bring in all your medications bottles to every appointment.   Need to Contact Us :  If you have any questions or concerns before your next appointment please send us  a message through [place] or call our office at [phone].    TO LEAVE A MESSAGE FOR THE NURSE SELECT OPTION 2, PLEASE LEAVE A MESSAGE INCLUDING: YOUR NAME DATE OF BIRTH CALL BACK NUMBER REASON FOR CALL**this is important as we prioritize the call backs  YOU WILL RECEIVE A CALL BACK THE SAME DAY AS LONG AS YOU CALL BEFORE 4:00 PM

## 2024-05-26 NOTE — Progress Notes (Signed)
 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   Admitted 04/23/24 with sharp anterior chest pain on and off present at rest without radiation exacerbating or relieving factor. This was associated with nausea and some sweating/diaphoresis per patient. Patient felt flushed. Patient also reports associated sensation of panic/anxiety with associated sensation of shortness of breath present at rest. Patient denies any new cough or expectoration although she has had a mild cough last several days. s/p CT chest abdomen pelvis, troponin testing which are not remarkable. Found to have sodium of 120. Diuretics held and IVF given with improvement of sodium to 132.   Admitted 05/18/24 due to worsening shortness of breath and pedal edema. On admission, BNP was 140.1, HS-troponin was 14, ferritin is 5, TSAT is 5. Chest x-ray noted cardiomegaly with central congestion. IV diuresed. Hemoglobin stable. Potassium replenished.   She presents today, with her daughter, for her initial HF visit with a chief complaint of moderate shortness of breath. Has associated fatigue, dizziness, occasional chest pain/ palpitations, pedal edema (improving), pain in right leg from RLS. Denies abdominal distention. Has difficulty sleeping due to restless leg syndrome.   Not adding salt and is now reading food labels for sodium intake. Daughter does her medication box and has been trying to get low sodium foods for her to eat.   Pertinent cardiac history: LHC in 09/2020 noted mild  nonobstructive CAD. Echo 09/2020 noted LVEF 55-60%. Echo 12/2021 noted LVEF of 65-70% with grade II diastolic dysfunction and moderate AS. Low risk stress test 10/2022. Echo 09/2023 noted LVEF 65-70%, grade II diastolic dysfunction, mild MR, and severe aortic stenosis. LHC 10/2023 noted mild nonobstructive CAD with preserved cardiac output and PAPI. Underwent TAVR 12/2023. Most recent echo 01/2024 noted LVEF of 60-65%, grade II diastolic dysfunction, mild MR, and mild to moderate AR though to be paravalvular.   Review of Systems: [y] = yes, [ ]  = no   General: Weight gain [ ] ; Weight loss [ ] ; Anorexia [ ] ; Fatigue Davis.Dad ]; Fever [ ] ; Chills [ ] ; Weakness [ ]   Cardiac: Chest pain/pressure Davis.Dad ]; Resting SOB [ ] ; Exertional SOB Davis.Dad ]; Orthopnea [ ] ; Pedal Edema Davis.Dad ]; Palpitations Davis.Dad ]; Syncope [ ] ; Presyncope [ ] ; Paroxysmal nocturnal dyspnea[ ]   Pulmonary: Cough [ ] ; Wheezing[ ] ; Hemoptysis[ ] ; Sputum [ ] ; Snoring [ ]   GI: Vomiting[ ] ; Dysphagia[ ] ; Melena[ ] ; Hematochezia [ ] ; Heartburn[ ] ; Abdominal pain [ ] ; Constipation [ ] ; Diarrhea [ ] ; BRBPR [ ]   GU: Hematuria[ ] ; Dysuria [ ] ; Nocturia[ ]   Vascular: Pain in legs with walking [ ] ; Pain in feet with lying flat [ ] ; Non-healing sores [ ] ; Stroke [ ] ; TIA [ ] ; Slurred speech [ ] ;  Neuro: Dizziness[y ]; Vertigo[ ] ; Seizures[ ] ; Paresthesias[ ] ;Blurred vision [ ] ; Diplopia [ ] ; Vision changes [ ]   Ortho/Skin: Arthritis [ ] ; Joint pain [ ] ; Muscle pain Davis.Dad ]; Joint swelling [ ] ; Back Pain [ ] ; Rash [ ]   Psych: Depression[ ] ; Anxiety[ ]   Heme: Bleeding problems [ ] ; Clotting disorders [ ] ;  Anemia [ y]  Endocrine: Diabetes [ ] ; Thyroid  dysfunction[y ]   Past Medical History:  Diagnosis Date   A-fib (HCC)    CHF (congestive heart failure) (HCC)    COPD (chronic obstructive pulmonary disease) (HCC)    GERD (gastroesophageal reflux disease)    Hypertension    Hypothyroidism    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   s/p TAVR  with a 23 mm Edwards S3UR via TF approach by Dr. Wendel and Dr. Maryjane   Thyroid  disease     Current Outpatient Medications  Medication Sig Dispense Refill   ALPRAZolam  (XANAX ) 0.25 MG tablet Take 0.25 mg by mouth at bedtime as needed for sleep.     amiodarone  (PACERONE ) 200 MG tablet Take 1 tablet (200 mg total) by mouth daily.     apixaban  (ELIQUIS ) 5 MG TABS tablet Take 5 mg by mouth 2 (two) times daily.     atorvastatin  (LIPITOR) 40 MG tablet Take 1 tablet (40 mg total) by mouth daily. 30 tablet 0   cholecalciferol  (VITAMIN D3) 25 MCG (1000 UNIT) tablet Take 1,000 Units by mouth daily.     FLUoxetine  (PROZAC ) 10 MG capsule Take 2 capsules (20 mg total) by mouth daily as needed (anxiety). 30 capsule 0   furosemide  (LASIX ) 20 MG tablet Take 1 tablet (20 mg total) by mouth daily.     Ipratropium-Albuterol  (COMBIVENT  RESPIMAT) 20-100 MCG/ACT AERS respimat Inhale 1 puff into the lungs every 6 (six) hours as needed for wheezing.     isosorbide  mononitrate (IMDUR ) 30 MG 24 hr tablet Take 1 tablet (30 mg total) by mouth daily. 30 tablet 1   levalbuterol  (XOPENEX ) 1.25 MG/3ML nebulizer solution Take 1.25 mg by nebulization every 6 (six) hours as needed.     levothyroxine  (SYNTHROID ) 75 MCG tablet Take 75 mcg by mouth daily before breakfast.     mometasone -formoterol  (DULERA ) 200-5 MCG/ACT AERO Inhale 2 puffs into the lungs 2 (two) times daily. 1 each 6   montelukast  (SINGULAIR ) 10 MG tablet Take 1 tablet by mouth at bedtime.     pantoprazole  (PROTONIX ) 40 MG tablet Take 1 tablet (40 mg total) by mouth 2 (two) times daily. 60 tablet 1   potassium chloride  SA (KLOR-CON  M) 20 MEQ tablet Take 1 tablet (20 mEq total) by mouth daily. Reduced from 2 tabs.     rOPINIRole  (REQUIP ) 0.25 MG tablet Take 1 tablet (0.25 mg total) by mouth at bedtime. 30 tablet 0   spironolactone  (ALDACTONE ) 25 MG tablet Take 1 tablet (25 mg total) by mouth daily. Home med.     sucralfate  (CARAFATE ) 1 g tablet Take 1 tablet (1  g total) by mouth 4 (four) times daily. 120 tablet 1   vitamin B-12 (CYANOCOBALAMIN ) 1000 MCG tablet Take 1,000 mcg by mouth daily.     No current facility-administered medications for this visit.    Allergies  Allergen Reactions   Naproxen Sodium Anaphylaxis      Social History   Socioeconomic History   Marital status: Widowed    Spouse name: Not on file   Number of children: Not on file   Years of education: Not on file   Highest education level: Not on file  Occupational History   Not on file  Tobacco Use   Smoking status: Former    Current packs/day: 0.50    Average packs/day: 0.5 packs/day for 12.0 years (6.0 ttl pk-yrs)    Types: Cigarettes   Smokeless tobacco: Never   Tobacco comments:  7-8 cigarettes a day  Vaping Use   Vaping status: Never Used  Substance and Sexual Activity   Alcohol use: Not Currently   Drug use: Not Currently   Sexual activity: Not Currently  Other Topics Concern   Not on file  Social History Narrative   Not on file   Social Drivers of Health   Financial Resource Strain: Low Risk  (04/27/2024)   Received from [facility]   Overall Financial Resource Strain (CARDIA)    Difficulty of Paying Living Expenses: Not hard at all  Food Insecurity: No Food Insecurity (05/19/2024)   Hunger Vital Sign    Worried About Running Out of Food in the Last Year: Never true    Ran Out of Food in the Last Year: Never true  Transportation Needs: No Transportation Needs (05/19/2024)   PRAPARE - Administrator, Civil Service (Medical): No    Lack of Transportation (Non-Medical): No  Physical Activity: Not on file  Stress: Not on file  Social Connections: Patient Declined (05/19/2024)   Social Connection and Isolation Panel    Frequency of Communication with Friends and Family: Patient declined    Frequency of Social Gatherings with Friends and Family: Patient declined    Attends Religious Services: Patient declined     Database administrator or Organizations: Patient declined    Attends Banker Meetings: Patient declined    Marital Status: Patient declined  Recent Concern: Social Connections - Socially Isolated (05/18/2024)   Social Connection and Isolation Panel    Frequency of Communication with Friends and Family: More than three times a week    Frequency of Social Gatherings with Friends and Family: Twice a week    Attends Religious Services: Never    Database administrator or Organizations: No    Attends Banker Meetings: Never    Marital Status: Widowed  Intimate Partner Violence: Not At Risk (05/19/2024)   Humiliation, Afraid, Rape, and Kick questionnaire    Fear of Current or Ex-Partner: No    Emotionally Abused: No    Physically Abused: No    Sexually Abused: No      Family History  Problem Relation Age of Onset   Breast cancer Neg Hx    Vitals:   05/26/24 1128  BP: (!) 159/69  Pulse: 68  SpO2: 99%  Weight: 180 lb (81.6 kg)  PF: (!) 2 L/min   Wt Readings from Last 3 Encounters:  05/26/24 180 lb (81.6 kg)  05/20/24 174 lb 12.8 oz (79.3 kg)  04/22/24 176 lb 5.9 oz (80 kg)   Lab Results  Component Value Date   CREATININE 1.00 05/20/2024   CREATININE 0.95 05/19/2024   CREATININE 0.78 05/17/2024    PHYSICAL EXAM:  General: Well appearing. No resp difficulty HEENT: normal Neck: supple, no JVD Cor: Regular rhythm, rate. No rubs, gallops or murmurs Lungs: clear Abdomen: soft, nontender, nondistended. Extremities: no cyanosis, clubbing, rash, 1+ pitting pedal edema bilateral lower legs Neuro: alert & oriented X 3. Moves all 4 extremities w/o difficulty. Affect pleasant   ECG: 05/19/24 was NSR   ASSESSMENT & PLAN:  1: NICM with preserved ejection fraction- - etiology likely valvular disease as cath showed nonobstructive CAD - NYHA class III - minimally fluid up with symptoms - weighing daily - Echo 12/2021: EF of 65-70% with grade II diastolic  dysfunction and moderate AS - Echo 09/2023: EF 65-70%, grade II diastolic dysfunction, mild MR, and severe  aortic stenosis. - Echo 01/2024: EF of 60-65%, grade II diastolic dysfunction, mild MR, and mild to moderate AR thought to be paravalvular. - continue furosemide  20mg  daily/ potassium 20meq daily - continue spironolactone  25mg  daily - begin farxiga 10mg  daily; BMET today and repeat at next visit - BNP 05/17/24 was 140.1  2: HTN- - BP 159/69 - saw PCP Loree) 03/25 - BMET 05/20/24 reviewed: sodium 129, potassium 3.1, creatinine 1.00 & GFR 57 - BMET today  3: PAF- - continue amiodarone  200mg  daily - saw cardiology (Agbor-Etang) 04/25 - TSH 04/23/24 was 4.185  4: Nonobstructive CAD- - LHC in 09/2020 noted mild nonobstructive CAD - LHC 10/2023 noted mild nonobstructive CAD with preserved cardiac output and PAPI.  5: Severe AS- - TAVR 01/25 - saw cardiology Adolphus) 02/25  6: Severe COPD- - saw pulm Alica (05/25)  7: Hyperlipidemia- - continue atorvastatin  40mg  daily - LDL 06/19/23 was 92 - lipid panel today   Return in 3 weeks, sooner if needed.   Ellouise DELENA Class, FNP 05/26/24

## 2024-05-27 ENCOUNTER — Telehealth: Payer: Self-pay | Admitting: Cardiology

## 2024-05-27 MED ORDER — SPIRONOLACTONE 25 MG PO TABS: 12.5000 mg | ORAL_TABLET | Freq: Every day | ORAL | Status: DC

## 2024-05-27 NOTE — Telephone Encounter (Signed)
 Spoke with daughter. She is aware and med list updated

## 2024-05-27 NOTE — Telephone Encounter (Signed)
 Mark, PT with [facility] called in to report pt weight is 181.8lbs which is out of her parameters. She states she is not having any symptoms.

## 2024-06-07 ENCOUNTER — Ambulatory Visit: Admitting: Cardiology

## 2024-06-14 ENCOUNTER — Telehealth: Payer: Self-pay | Admitting: Family

## 2024-06-14 NOTE — Progress Notes (Unsigned)
 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   Admitted 04/23/24 with sharp anterior chest pain on and off present at rest without radiation exacerbating or relieving factor. This was associated with nausea and some sweating/diaphoresis per patient. Patient felt flushed. Patient also reports associated sensation of panic/anxiety with associated sensation of shortness of breath present at rest. Patient denies any new cough or expectoration although she has had a mild cough last several days. s/p CT chest abdomen pelvis, troponin testing which are not remarkable. Found to have sodium of 120. Diuretics held and IVF given with improvement of sodium to 132.   Admitted 05/18/24 due to worsening shortness of breath and pedal edema. On admission, BNP was 140.1, HS-troponin was 14, ferritin is 5, TSAT is 5. Chest x-ray noted cardiomegaly with central congestion. IV diuresed. Hemoglobin stable. Potassium replenished.   She presents today, with her daughter, for her initial HF visit with a chief complaint of moderate shortness of breath. Has associated fatigue, dizziness, occasional chest pain/ palpitations, pedal edema (improving), pain in right leg from RLS. Denies abdominal distention. Has difficulty sleeping due to restless leg syndrome.   Not adding salt and is now reading food labels for sodium intake. Daughter does her medication box and has been trying to get low sodium foods for her to eat.   Pertinent cardiac history: LHC in 09/2020 noted mild  nonobstructive CAD. Echo 09/2020 noted LVEF 55-60%. Echo 12/2021 noted LVEF of 65-70% with grade II diastolic dysfunction and moderate AS. Low risk stress test 10/2022. Echo 09/2023 noted LVEF 65-70%, grade II diastolic dysfunction, mild MR, and severe aortic stenosis. LHC 10/2023 noted mild nonobstructive CAD with preserved cardiac output and PAPI. Underwent TAVR 12/2023. Most recent echo 01/2024 noted LVEF of 60-65%, grade II diastolic dysfunction, mild MR, and mild to moderate AR though to be paravalvular.   Review of Systems: [y] = yes, [ ]  = no   General: Weight gain [ ] ; Weight loss [ ] ; Anorexia [ ] ; Fatigue Davis.Dad ]; Fever [ ] ; Chills [ ] ; Weakness [ ]   Cardiac: Chest pain/pressure Davis.Dad ]; Resting SOB [ ] ; Exertional SOB Davis.Dad ]; Orthopnea [ ] ; Pedal Edema Davis.Dad ]; Palpitations Davis.Dad ]; Syncope [ ] ; Presyncope [ ] ; Paroxysmal nocturnal dyspnea[ ]   Pulmonary: Cough [ ] ; Wheezing[ ] ; Hemoptysis[ ] ; Sputum [ ] ; Snoring [ ]   GI: Vomiting[ ] ; Dysphagia[ ] ; Melena[ ] ; Hematochezia [ ] ; Heartburn[ ] ; Abdominal pain [ ] ; Constipation [ ] ; Diarrhea [ ] ; BRBPR [ ]   GU: Hematuria[ ] ; Dysuria [ ] ; Nocturia[ ]   Vascular: Pain in legs with walking [ ] ; Pain in feet with lying flat [ ] ; Non-healing sores [ ] ; Stroke [ ] ; TIA [ ] ; Slurred speech [ ] ;  Neuro: Dizziness[y ]; Vertigo[ ] ; Seizures[ ] ; Paresthesias[ ] ;Blurred vision [ ] ; Diplopia [ ] ; Vision changes [ ]   Ortho/Skin: Arthritis [ ] ; Joint pain [ ] ; Muscle pain Davis.Dad ]; Joint swelling [ ] ; Back Pain [ ] ; Rash [ ]   Psych: Depression[ ] ; Anxiety[ ]   Heme: Bleeding problems [ ] ; Clotting disorders [ ] ;  Anemia [ y]  Endocrine: Diabetes [ ] ; Thyroid  dysfunction[y ]   Past Medical History:  Diagnosis Date   A-fib (HCC)    CHF (congestive heart failure) (HCC)    COPD (chronic obstructive pulmonary disease) (HCC)    GERD (gastroesophageal reflux disease)    Hypertension    Hypothyroidism    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   s/p TAVR  with a 23 mm Edwards S3UR via TF approach by Dr. Wendel and Dr. Maryjane   Thyroid  disease     Current Outpatient Medications  Medication Sig Dispense Refill   ALPRAZolam  (XANAX ) 0.25 MG tablet Take 0.25 mg by mouth at bedtime as needed for sleep.     amiodarone  (PACERONE ) 200 MG tablet Take 1 tablet (200 mg total) by mouth daily.     apixaban  (ELIQUIS ) 5 MG TABS tablet Take 5 mg by mouth 2 (two) times daily.     atorvastatin  (LIPITOR) 40 MG tablet Take 1 tablet (40 mg total) by mouth daily. 30 tablet 0   cholecalciferol  (VITAMIN D3) 25 MCG (1000 UNIT) tablet Take 1,000 Units by mouth daily.     dapagliflozin  propanediol (FARXIGA ) 10 MG TABS tablet Take 1 tablet (10 mg total) by mouth daily before breakfast. 30 tablet 11   FLUoxetine  (PROZAC ) 10 MG capsule Take 2 capsules (20 mg total) by mouth daily as needed (anxiety). 30 capsule 0   furosemide  (LASIX ) 20 MG tablet Take 1 tablet (20 mg total) by mouth daily.     Ipratropium-Albuterol  (COMBIVENT  RESPIMAT) 20-100 MCG/ACT AERS respimat Inhale 1 puff into the lungs every 6 (six) hours as needed for wheezing.     isosorbide  mononitrate (IMDUR ) 30 MG 24 hr tablet Take 1 tablet (30 mg total) by mouth daily. 30 tablet 1   levalbuterol  (XOPENEX ) 1.25 MG/3ML nebulizer solution Take 1.25 mg by nebulization every 6 (six) hours as needed.     levothyroxine  (SYNTHROID ) 75 MCG tablet Take 75 mcg by mouth daily before breakfast.     mometasone -formoterol  (DULERA ) 200-5 MCG/ACT AERO Inhale 2 puffs into the lungs 2 (two) times daily. 1 each 6   montelukast  (SINGULAIR ) 10 MG tablet Take 1 tablet by mouth at bedtime.     pantoprazole  (PROTONIX ) 40 MG tablet Take 1 tablet (40 mg total) by mouth 2 (two) times daily. 60 tablet 1   potassium chloride  SA (KLOR-CON  M) 20 MEQ tablet Take 1 tablet (20 mEq total) by mouth daily. Reduced from 2 tabs.     predniSONE  (DELTASONE ) 5 MG tablet Take 5 mg by mouth daily with breakfast.     rOPINIRole  (REQUIP ) 0.25 MG tablet  Take 1 tablet (0.25 mg total) by mouth at bedtime. 30 tablet 0   spironolactone  (ALDACTONE ) 25 MG tablet Take 0.5 tablets (12.5 mg total) by mouth daily.     sucralfate  (CARAFATE ) 1 g tablet Take 1 tablet (1 g total) by mouth 4 (four) times daily. 120 tablet 1   vitamin B-12 (CYANOCOBALAMIN ) 1000 MCG tablet Take 1,000 mcg by mouth daily.     No current facility-administered medications for this visit.    Allergies  Allergen Reactions   Naproxen Sodium Anaphylaxis      Social History   Socioeconomic History   Marital status: Widowed    Spouse name: Not on file   Number of children: Not on file   Years of education: Not on file   Highest education level: Not on file  Occupational History   Not on file  Tobacco Use  Smoking status: Former    Current packs/day: 0.50    Average packs/day: 0.5 packs/day for 12.0 years (6.0 ttl pk-yrs)    Types: Cigarettes   Smokeless tobacco: Never   Tobacco comments:    7-8 cigarettes a day  Vaping Use   Vaping status: Never Used  Substance and Sexual Activity   Alcohol use: Not Currently   Drug use: Not Currently   Sexual activity: Not Currently  Other Topics Concern   Not on file  Social History Narrative   Not on file   Social Drivers of Health   Financial Resource Strain: Low Risk  (04/27/2024)   Received from [facility]   Overall Financial Resource Strain (CARDIA)    Difficulty of Paying Living Expenses: Not hard at all  Food Insecurity: No Food Insecurity (05/19/2024)   Hunger Vital Sign    Worried About Running Out of Food in the Last Year: Never true    Ran Out of Food in the Last Year: Never true  Transportation Needs: No Transportation Needs (05/19/2024)   PRAPARE - Administrator, Civil Service (Medical): No    Lack of Transportation (Non-Medical): No  Physical Activity: Not on file  Stress: Not on file  Social Connections: Patient Declined (05/19/2024)   Social Connection and Isolation  Panel    Frequency of Communication with Friends and Family: Patient declined    Frequency of Social Gatherings with Friends and Family: Patient declined    Attends Religious Services: Patient declined    Database administrator or Organizations: Patient declined    Attends Banker Meetings: Patient declined    Marital Status: Patient declined  Recent Concern: Social Connections - Socially Isolated (05/18/2024)   Social Connection and Isolation Panel    Frequency of Communication with Friends and Family: More than three times a week    Frequency of Social Gatherings with Friends and Family: Twice a week    Attends Religious Services: Never    Database administrator or Organizations: No    Attends Banker Meetings: Never    Marital Status: Widowed  Intimate Partner Violence: Not At Risk (05/19/2024)   Humiliation, Afraid, Rape, and Kick questionnaire    Fear of Current or Ex-Partner: No    Emotionally Abused: No    Physically Abused: No    Sexually Abused: No      Family History  Problem Relation Age of Onset   Breast cancer Neg Hx    There were no vitals filed for this visit.  Wt Readings from Last 3 Encounters:  05/26/24 180 lb (81.6 kg)  05/20/24 174 lb 12.8 oz (79.3 kg)  04/22/24 176 lb 5.9 oz (80 kg)   Lab Results  Component Value Date   CREATININE 0.87 05/26/2024   CREATININE 1.00 05/20/2024   CREATININE 0.95 05/19/2024    PHYSICAL EXAM:  General: Well appearing. No resp difficulty HEENT: normal Neck: supple, no JVD Cor: Regular rhythm, rate. No rubs, gallops or murmurs Lungs: clear Abdomen: soft, nontender, nondistended. Extremities: no cyanosis, clubbing, rash, 1+ pitting pedal edema bilateral lower legs Neuro: alert & oriented X 3. Moves all 4 extremities w/o difficulty. Affect pleasant   ECG: 05/19/24 was NSR   ASSESSMENT & PLAN:  1: NICM with preserved ejection fraction- - etiology likely valvular disease as cath showed  nonobstructive CAD - NYHA class III - minimally fluid up with symptoms - weighing daily - Echo 12/2021: EF of 65-70% with  grade II diastolic dysfunction and moderate AS - Echo 09/2023: EF 65-70%, grade II diastolic dysfunction, mild MR, and severe aortic stenosis. - Echo 01/2024: EF of 60-65%, grade II diastolic dysfunction, mild MR, and mild to moderate AR thought to be paravalvular. - continue furosemide  20mg  daily/ potassium 20meq daily - continue spironolactone  25mg  daily - begin farxiga  10mg  daily; BMET today and repeat at next visit - BNP 05/17/24 was 140.1  2: HTN- - BP 159/69 - saw PCP Loree) 03/25 - BMET 05/20/24 reviewed: sodium 129, potassium 3.1, creatinine 1.00 & GFR 57 - BMET today  3: PAF- - continue amiodarone  200mg  daily - saw cardiology (Agbor-Etang) 04/25 - TSH 04/23/24 was 4.185  4: Nonobstructive CAD- - LHC in 09/2020 noted mild nonobstructive CAD - LHC 10/2023 noted mild nonobstructive CAD with preserved cardiac output and PAPI.  5: Severe AS- - TAVR 01/25 - saw cardiology Adolphus) 02/25  6: Severe COPD- - saw pulm Alica (05/25)  7: Hyperlipidemia- - continue atorvastatin  40mg  daily - LDL 06/19/23 was 92 - lipid panel today   Return in 3 weeks, sooner if needed.   Ellouise DELENA Class, FNP 06/14/24

## 2024-06-14 NOTE — Telephone Encounter (Signed)
 Called to confirm/remind patient of their appointment at the [facility] on 06/15/24.   Appointment:   [x] Confirmed  [] Left mess   [] No answer/No voice mail  [] VM Full/unable to leave message  [] Phone not in service  Patient reminded to bring all medications and/or complete list.  Confirmed patient has transportation. Gave directions, instructed to utilize valet parking.

## 2024-06-15 ENCOUNTER — Ambulatory Visit (HOSPITAL_BASED_OUTPATIENT_CLINIC_OR_DEPARTMENT_OTHER): Admitting: Family

## 2024-06-15 ENCOUNTER — Encounter: Payer: Self-pay | Admitting: Family

## 2024-06-15 ENCOUNTER — Other Ambulatory Visit
Admission: RE | Admit: 2024-06-15 | Discharge: 2024-06-15 | Disposition: A | Discharge status: Home / Self Care | Source: Ambulatory Visit | Attending: Family | Admitting: Family

## 2024-06-15 ENCOUNTER — Ambulatory Visit: Payer: Self-pay | Admitting: Family

## 2024-06-15 ENCOUNTER — Telehealth: Payer: Self-pay

## 2024-06-15 ENCOUNTER — Other Ambulatory Visit (HOSPITAL_COMMUNITY): Payer: Self-pay

## 2024-06-15 VITALS — BP 167/66 | HR 66 | Wt 181.0 lb

## 2024-06-15 DIAGNOSIS — J449 Chronic obstructive pulmonary disease, unspecified: Secondary | ICD-10-CM

## 2024-06-15 DIAGNOSIS — I5032 Chronic diastolic (congestive) heart failure: Secondary | ICD-10-CM

## 2024-06-15 DIAGNOSIS — I10 Essential (primary) hypertension: Secondary | ICD-10-CM

## 2024-06-15 DIAGNOSIS — I2510 Atherosclerotic heart disease of native coronary artery without angina pectoris: Secondary | ICD-10-CM

## 2024-06-15 DIAGNOSIS — E782 Mixed hyperlipidemia: Secondary | ICD-10-CM

## 2024-06-15 DIAGNOSIS — I480 Paroxysmal atrial fibrillation: Secondary | ICD-10-CM

## 2024-06-15 DIAGNOSIS — Z952 Presence of prosthetic heart valve: Secondary | ICD-10-CM

## 2024-06-15 LAB — BASIC METABOLIC PANEL WITH GFR
Anion gap: 9 (ref 5–15)
BUN: 10 mg/dL (ref 8–23)
CO2: 30 mmol/L (ref 22–32)
Calcium: 9.7 mg/dL (ref 8.9–10.3)
Chloride: 98 mmol/L (ref 98–111)
Creatinine, Ser: 0.94 mg/dL (ref 0.44–1.00)
GFR, Estimated: >60 mL/min (ref >60)
Glucose, Bld: 103 mg/dL — ABNORMAL HIGH (ref 70–99)
Potassium: 3.2 mmol/L — ABNORMAL LOW (ref 3.5–5.1)
Sodium: 137 mmol/L (ref 135–145)

## 2024-06-15 NOTE — Telephone Encounter (Signed)
 Lvm to return call for labs

## 2024-06-15 NOTE — Patient Instructions (Addendum)
 Medication Changes:  No medication changes.   Lab Work:  Go over to the [facility]. Go pass the gift shop and have your blood work completed.  We will only call you if the results are abnormal or if the provider would like to make medication changes.   Special Instructions // Education:  Get compression socks and wear them daily. Remove them at bedtime.   Follow-Up in: 3 months with Ellouise Class, FNP.  At the [facility], you and your health needs are our priority. We have a designated team specialized in the treatment of Heart Failure. This Care Team includes your primary Heart Failure Specialized Cardiologist (physician), Advanced Practice Providers (APPs- Physician Assistants and Nurse Practitioners), and Pharmacist who all work together to provide you with the care you need, when you need it.   You may see any of the following providers on your designated Care Team at your next follow up:  Dr. Toribio Fuel Dr. Ezra Shuck Dr. Ria Commander Dr. Odis Brownie Ellouise Class, FNP Jaun Bash, RPH-CPP  Please be sure to bring in all your medications bottles to every appointment.   Need to Contact Us :  If you have any questions or concerns before your next appointment please send us  a message through [place] or call our office at [phone].    TO LEAVE A MESSAGE FOR THE NURSE SELECT OPTION 2, PLEASE LEAVE A MESSAGE INCLUDING: YOUR NAME DATE OF BIRTH CALL BACK NUMBER REASON FOR CALL**this is important as we prioritize the call backs  YOU WILL RECEIVE A CALL BACK THE SAME DAY AS LONG AS YOU CALL BEFORE 4:00 PM s

## 2024-06-15 NOTE — Telephone Encounter (Signed)
-----   Message from Ellouise DELENA Class sent at 06/15/2024  1:20 PM EDT ----- Kidney function looks great. Sodium level has improved. Let's increase the spironolactone  to 25mg  daily. Repeat BMET in 1 week.

## 2024-06-16 ENCOUNTER — Ambulatory Visit
Admission: RE | Admit: 2024-06-16 | Discharge: 2024-06-16 | Disposition: A | Discharge status: Home / Self Care | Source: Ambulatory Visit | Attending: Physical Medicine & Rehabilitation | Admitting: Physical Medicine & Rehabilitation

## 2024-06-16 ENCOUNTER — Telehealth: Payer: Self-pay

## 2024-06-16 ENCOUNTER — Other Ambulatory Visit: Payer: Self-pay | Admitting: Physical Medicine & Rehabilitation

## 2024-06-16 DIAGNOSIS — M5442 Lumbago with sciatica, left side: Secondary | ICD-10-CM | POA: Diagnosis present

## 2024-06-16 NOTE — Telephone Encounter (Signed)
 [facility] Patient Advocate Encounter  Review of patient account to determine eligibility for grant that would cover the cost of Farxiga . Based on this patients current coverage, pt would not be eligible for [employer] at this time.  Spoke with daughter by phone, daughter expressed understanding.  Rachel DEL, CPhT Rx Patient Advocate Phone: [phone]

## 2024-06-18 ENCOUNTER — Other Ambulatory Visit: Payer: Self-pay | Admitting: Physical Medicine & Rehabilitation

## 2024-06-18 DIAGNOSIS — S322XXB Fracture of coccyx, initial encounter for open fracture: Secondary | ICD-10-CM

## 2024-06-18 MED ORDER — SPIRONOLACTONE 25 MG PO TABS: 25.0000 mg | ORAL_TABLET | Freq: Every day | ORAL | Status: AC

## 2024-06-21 ENCOUNTER — Encounter (HOSPITAL_COMMUNITY): Payer: Self-pay | Admitting: Radiology

## 2024-06-21 ENCOUNTER — Telehealth: Payer: Self-pay | Admitting: *Deleted

## 2024-06-21 NOTE — Telephone Encounter (Signed)
 Ellouise Class, NP , patient's chart was reviewed for preoperative cardiac evaluation. She was seen by you on 06/15/24 and according to protocol, we request that you comment on cardiac risk for upcoming procedure since office visit was less than 2 months ago.    Please route your response to p cv div preop.  Thank you, Rosaline EMERSON Bane, NP-C 06/21/2024, 10:54 AM

## 2024-06-21 NOTE — Telephone Encounter (Signed)
   Pre-operative Risk Assessment    Patient Name: Linda Brady  DOB: May 15, 1941 MRN: [id number]   Date of last office visit: 06/15/24 ELLOUISE CLASS, FNP Date of next office visit: 07/16/24 DR. AGBOR-ETANG    Request for Surgical Clearance    Procedure:  SACROPLASTY  Date of Surgery:  Clearance TBD                                Surgeon: NOT LISTED Surgeon's Group or Practice Name:  [facility] Phone number:  [phone] Fax number:  [phone]   Type of Clearance Requested:   - Medical  - Pharmacy:  Hold Apixaban  (Eliquis ) x 4 DOSES    Type of Anesthesia:  Not Indicated   Additional requests/questions:    Bonney Niels Jest   06/21/2024, 10:03 AM

## 2024-06-22 ENCOUNTER — Ambulatory Visit
Admission: RE | Admit: 2024-06-22 | Discharge: 2024-06-22 | Disposition: A | Discharge status: Home / Self Care | Source: Ambulatory Visit | Attending: Physical Medicine & Rehabilitation | Admitting: Physical Medicine & Rehabilitation

## 2024-06-22 ENCOUNTER — Other Ambulatory Visit: Payer: Self-pay | Admitting: Interventional Radiology

## 2024-06-22 DIAGNOSIS — S3210XA Unspecified fracture of sacrum, initial encounter for closed fracture: Secondary | ICD-10-CM

## 2024-06-22 DIAGNOSIS — S3210XB Unspecified fracture of sacrum, initial encounter for open fracture: Secondary | ICD-10-CM

## 2024-06-22 HISTORY — PX: IR RADIOLOGIST EVAL & MGMT: IMG5224

## 2024-06-22 NOTE — Consult Note (Signed)
 Chief Complaint: Acute bilateral sacral ala fractures, Request is evaluation for sacroplasty  Referring Physician(s): Morales,Kateleen Encarnacion I  History of Present Illness: Linda Brady is a 83 y.o. female . History significant for HTN, Hypothyroidism, COPD (on prednisone ), CHF,  A fib (on eliquis ). Linda Brady reports a history of osteoporosis. Linda Brady states that in early July she had a sudden onset of lower back pain that radiates down her left leg.   She  followed up with his PCP on 7.16.25. Unfortunately do to her COPD and eliquis  medicinal options are limited. She was prescribed gabapentin  and a MR was ordered. MRI showed acute sacral fractures.  MR Lumbar Spine from 7.16.25 reads Acute bilateral sacral ala fractures, left greater than right, with associated marrow edema and mild focal anterior angulation at S2. . She endorses  weakness to her left lower extremity. Denies numbness and tingling, loss of bladder  or bowel. She describes the pain as persistent and worsening. She states that it is so intense at times she is nauseous and is unable to sleep. She is unable to ambulate without the use of a walker. The pain is made worse with standing, walking, bending, lifting, twisting, laying down, squatting, stairs, coughing, sneezing. It is made better temporarily with gabapentin  and by sitting. She does take Tylenol  as needed which as not helped  to resolve the pain. She endorses pinpoint tenderness to the sacral region.      The patient has kindly been referred to Interventional Radiology to discuss bilateral sacroplasty. . She  is accompanied to the clinic today by her daughter.  For the past month she has had incredibly limited mobility that is affecting her daily living. This pain was not present prior to July 2025.  She rates her pain an 10/10 and she scored a 16/24 on the [place] Disability Questionnaire.   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 Wendel and Dr. Maryjane   Thyroid  disease     Past Surgical History:  Procedure Laterality Date   BREAST CYST ASPIRATION Left 10 plus yrs   benign-twice   CATARACT EXTRACTION W/PHACO Left 03/20/2023   Procedure: CATARACT EXTRACTION PHACO AND INTRAOCULAR LENS PLACEMENT (IOC) LEFT VISION BLUE;  Surgeon: Enola Feliciano Hugger, MD;  Location: [facility];  Service: Ophthalmology;  Laterality: Left;  21.02   01:38.2   INTRAOPERATIVE TRANSTHORACIC ECHOCARDIOGRAM N/A 12/16/2023   Procedure: INTRAOPERATIVE TRANSTHORACIC ECHOCARDIOGRAM;  Surgeon: Wendel Lurena POUR, MD;  Location: [facility] INVAS[facility] CV LAB;  Service: Cardiovascular;  Laterality: N/A;   IR RADIOLOGIST EVAL & MGMT  06/22/2024   LEFT HEART CATH AND CORONARY ANGIOGRAPHY N/A 09/11/2020   Procedure: LEFT HEART CATH AND CORONARY ANGIOGRAPHY;  Surgeon: Ammon Blunt, MD;  Location: [facility] INVASIVE CV LAB;  Service: Cardiovascular;  Laterality: N/A;   PERIPHERAL VASCULAR INTERVENTION Right 10/24/2023   Procedure: PERIPHERAL VASCULAR INTERVENTION;  Surgeon: Magda Debby SAILOR, MD;  Location: [facility] INVASIVE CV LAB;  Service: Cardiovascular;  Laterality: Right;  right axillary stent   RIGHT/LEFT HEART CATH AND CORONARY ANGIOGRAPHY N/A 10/23/2023   Procedure: RIGHT/LEFT HEART CATH AND CORONARY ANGIOGRAPHY;  Surgeon: Wendel Lurena POUR, MD;  Location:  [facility] CV [facility];  Service: Cardiovascular;  Laterality: N/A;   UPPER EXTREMITY ANGIOGRAPHY Right 10/24/2023   Procedure: Upper Extremity Angiography;  Surgeon: Magda Debby SAILOR, MD;  Location: [facility] INVASIVE CV LAB;  Service: Cardiovascular;  Laterality: Right;    Allergies: Naproxen sodium  Medications: Prior to Admission  medications   Medication Sig Start Date End Date Taking? Authorizing Provider  ALPRAZolam  (XANAX ) 0.25 MG tablet Take 0.25 mg by mouth at bedtime as needed for sleep.    [provider]  amiodarone  (PACERONE ) 200 MG tablet Take 1 tablet (200 mg total) by mouth daily. 01/16/24   Sebastian Lamarr SAUNDERS, PA-C  apixaban  (ELIQUIS ) 5 MG TABS tablet Take 5 mg by mouth 2 (two) times daily.    [provider]  atorvastatin  (LIPITOR) 40 MG tablet Take 1 tablet (40 mg total) by mouth daily. 09/20/23   Alexander, Natalie, DO  cholecalciferol  (VITAMIN D3) 25 MCG (1000 UNIT) tablet Take 1,000 Units by mouth daily.    [provider]  dapagliflozin  propanediol (FARXIGA ) 10 MG TABS tablet Take 1 tablet (10 mg total) by mouth daily before breakfast. 05/26/24   Donette Ellouise LABOR, FNP  FLUoxetine  (PROZAC ) 10 MG capsule Take 2 capsules (20 mg total) by mouth daily as needed (anxiety). 11/17/23 06/15/24  Viviann Pastor, MD  furosemide  (LASIX ) 20 MG tablet Take 1 tablet (20 mg total) by mouth daily. 04/24/24 07/23/24  Wouk, Devaughn Sayres, MD  Ipratropium-Albuterol  (COMBIVENT  RESPIMAT) 20-100 MCG/ACT AERS respimat Inhale 1 puff into the lungs every 6 (six) hours as needed for wheezing.    [provider]  isosorbide  mononitrate (IMDUR ) 30 MG 24 hr tablet Take 1 tablet (30 mg total) by mouth daily. 10/14/22   Furth, Cadence H, PA-C  levalbuterol  (XOPENEX ) 1.25 MG/3ML nebulizer solution Take 1.25 mg by nebulization every 6 (six) hours as needed. 12/08/23 12/07/24  [provider]  levothyroxine  (SYNTHROID ) 75 MCG tablet Take 75 mcg by mouth daily before breakfast. 05/02/20   [provider]  mometasone -formoterol  (DULERA ) 200-5 MCG/ACT AERO Inhale 2 puffs into the lungs 2 (two) times daily. 12/17/23   Sebastian Lamarr SAUNDERS, PA-C  montelukast  (SINGULAIR ) 10 MG tablet Take 1 tablet by mouth at bedtime. 02/03/24 02/02/25  [provider]  pantoprazole  (PROTONIX ) 40 MG tablet Take 1  tablet (40 mg total) by mouth 2 (two) times daily. 11/17/23 06/15/24  Viviann Pastor, MD  potassium chloride  SA (KLOR-CON  M) 20 MEQ tablet Take 1 tablet (20 mEq total) by mouth daily. Reduced from 2 tabs. 05/20/24   Awanda Ellouise, MD  predniSONE  (DELTASONE ) 5 MG tablet Take 5 mg by mouth daily with breakfast.    [provider]  rOPINIRole  (REQUIP ) 0.25 MG tablet Take 1 tablet (0.25 mg total) by mouth at bedtime. 10/28/23 06/15/24  Leotis Bogus, MD  spironolactone  (ALDACTONE ) 25 MG tablet Take 1 tablet (25 mg total) by mouth daily. 06/18/24 09/16/24  Donette Ellouise LABOR, FNP  sucralfate  (CARAFATE ) 1 g tablet Take 1 tablet (1 g total) by mouth 4 (four) times daily. 11/17/23   Viviann Pastor, MD  vitamin B-12 (CYANOCOBALAMIN ) 1000 MCG tablet Take 1,000 mcg by mouth daily.    [provider]     Family History  Problem Relation Age of Onset   Breast cancer Neg Hx     Social History   Socioeconomic History   Marital status: Widowed    Spouse name: Not on file   Number of children: Not on file   Years of education: Not  on file   Highest education level: Not on file  Occupational History   Not on file  Tobacco Use   Smoking status: Former    Current packs/day: 0.50    Average packs/day: 0.5 packs/day for 12.0 years (6.0 ttl pk-yrs)    Types: Cigarettes   Smokeless tobacco: Never   Tobacco comments:    7-8 cigarettes a day  Vaping Use   Vaping status: Never Used  Substance and Sexual Activity   Alcohol use: Not Currently   Drug use: Not Currently   Sexual activity: Not Currently  Other Topics Concern   Not on file  Social History Narrative   Not on file   Social Drivers of Health   Financial Resource Strain: Low Risk  (06/16/2024)   Received from [facility]   Overall Financial Resource Strain (CARDIA)    Difficulty of Paying Living Expenses: Not hard at all  Food Insecurity: No Food Insecurity (06/16/2024)   Received from [facility]   Hunger Vital Sign    Within the past 12 months, you worried that your food would run out before you got the money to buy more.: Never true    Within the past 12 months, the food you bought just didn't last and you didn't have money to get more.: Never true  Transportation Needs: No Transportation Needs (06/16/2024)   Received from [facility] - Transportation    In the past 12 months, has lack of transportation kept you from medical appointments or from getting medications?: No    Lack of Transportation (Non-Medical): No  Physical Activity: Not on file  Stress: Not on file  Social Connections: Patient Declined (05/19/2024)   Social Connection and Isolation Panel    Frequency of Communication with Friends and Family: Patient declined    Frequency of Social Gatherings with Friends and Family: Patient declined    Attends Religious Services: Patient declined    Database administrator or Organizations: Patient declined    Attends Banker Meetings: Patient declined    Marital Status: Patient declined  Recent Concern: Social Connections - Socially Isolated (05/18/2024)   Social Connection and Isolation Panel    Frequency of Communication with Friends and Family: More than three times a week    Frequency of Social Gatherings with Friends and Family: Twice a week    Attends Religious Services: Never    Database administrator or Organizations: No    Attends Banker Meetings: Never    Marital Status: Widowed     Review of Systems: A 12 point ROS discussed and pertinent positives are indicated in the HPI above.  All other systems are negative.  Review of Systems  Constitutional:  Negative for fatigue and fever.  HENT:  Negative for congestion.   Respiratory:  Negative for cough and shortness of breath.   Gastrointestinal:  Negative for abdominal pain, diarrhea, nausea and vomiting.  Musculoskeletal:  Positive for back pain  (radiating down her left leg).    Vital Signs: BP (!) 185/74   Pulse 80   Temp 98 F (36.7 C)   Resp 16   SpO2 97%   Advance Care Plan: The advanced care plan/surrogate decision maker was discussed at the time of visit and the patient did not wish to discuss or was not able to name a surrogate decision maker or provide an advance care plan.   Physical Exam Eyes:  Conjunctiva/sclera: Conjunctivae normal.  Cardiovascular:     Rate and Rhythm: Regular rhythm.  Pulmonary:     Comments: On O2 via [place] at baseline Musculoskeletal:        General: Tenderness present.       Arms:     Comments: Pinpoint tenderness with palpation   Skin:    General: Skin is warm and dry.  Neurological:     General: No focal deficit present.     Mental Status: She is alert. Mental status is at baseline.  Psychiatric:        Mood and Affect: Mood normal.        Behavior: Behavior normal.        Thought Content: Thought content normal.        Judgment: Judgment normal.        Imaging: IR Radiologist Eval & Mgmt Result Date: 06/22/2024 EXAM: NEW PATIENT OFFICE VISIT CHIEF COMPLAINT: SEE EPIC NOTE HISTORY OF PRESENT ILLNESS: SEE EPIC NOTE REVIEW OF SYSTEMS: SEE EPIC NOTE PHYSICAL EXAMINATION: SEE EPIC NOTE ASSESSMENT AND PLAN: SEE EPIC NOTE Electronically Signed   By: Wilkie Lent M.D.   On: 06/22/2024 13:26   MR LUMBAR SPINE WO CONTRAST Result Date: 06/16/2024 CLINICAL DATA:  Left-sided low back pain radiating into the leg with weakness for 2 weeks. No known injury or prior relevant surgery. EXAM: MRI LUMBAR SPINE WITHOUT CONTRAST TECHNIQUE: Multiplanar, multisequence MR imaging of the lumbar spine was performed. No intravenous contrast was administered. COMPARISON:  CT of the abdomen and pelvis 04/23/2024. FINDINGS: Segmentation: Conventional anatomy assumed, with the last open disc space designated L5-S1.Concordant with prior CT. Alignment:  Physiologic. Vertebrae: No worrisome osseous lesion,  acute fracture or pars defect within the lumbar spine. There are bilateral sacral ala fractures, left greater than right, with associated marrow edema and mild focal anterior angulation at S2. Trace fluid within the left sacroiliac joint, likely reactive. Conus medullaris: Extends to the L2 level. The conus and cauda equina appear normal. Paraspinal and other soft tissues: No significant paraspinal findings. Mild soft tissue edema adjacent to the left sacral ala fracture. Bilateral renal cysts for which no specific follow-up imaging is recommended. Disc levels: No significant disc space findings at T12-L1, L1-2 or L2-3. L3-4: Preserved disc height with mild disc bulging and mild bilateral facet hypertrophy. No spinal stenosis or significant foraminal narrowing. L4-5: Mild loss of disc height with mild disc bulging and endplate osteophytes. Mild facet and ligamentous hypertrophy. No spinal stenosis or significant foraminal narrowing. L5-S1: Preserved disc height with mild disc bulging and mild bilateral facet hypertrophy. No spinal stenosis or significant foraminal narrowing. IMPRESSION: 1. Acute bilateral sacral ala fractures, left greater than right, with associated marrow edema and mild focal anterior angulation at S2. 2. No acute lumbar spine findings. 3. Minimal lumbar spondylosis for age, without spinal stenosis, significant foraminal narrowing or nerve root impingement. Electronically Signed   By: Elsie Perone M.D.   On: 06/16/2024 11:44    Labs:  CBC: Recent Labs    04/22/24 2242 04/24/24 0443 05/17/24 1935 05/20/24 0430  WBC 6.8 5.5 7.5 6.0  HGB 9.8* 9.4* 8.4* 9.6*  HCT 29.7* 29.9* 26.4* 31.2*  PLT 438* 451* 385 433*    COAGS: Recent Labs    11/28/23 0930 12/16/23 1140 04/22/24 2242 04/24/24 0443  INR 1.0 1.0 1.2 1.3*  APTT  --   --   --  30    BMP: Recent Labs    05/19/24 0440 05/20/24 0430  05/26/24 1236 06/15/24 1158  NA 132* 129* 127* 137  K 3.1* 3.1* 3.6 3.2*  CL  88* 85* 89* 98  CO2 30 33* 26 30  GLUCOSE 92 103* 114* 103*  BUN 10 14 13 10   CALCIUM  9.4 9.6 9.4 9.7  CREATININE 0.95 1.00 0.87 0.94  GFRNONAA >60 57* >60 >60    LIVER FUNCTION TESTS: Recent Labs    12/16/23 1140 03/27/24 1410 04/23/24 0208 05/17/24 1935  BILITOT 0.8 0.9 0.5 0.7  AST 36 35 35 42*  ALT 36 25 26 35  ALKPHOS 145* 89 95 67  PROT 7.3 7.5 7.5 6.8  ALBUMIN 3.6 4.0 4.1 3.4*    Assessment and Plan:  Assessment & Plan:   Patient has suffered acute osteoporotic fracture of the  bilateral sacral ala fractures,  History and exam have demonstrated the following:  Acute/Subacute fracture by imaging dated 7.16.25, Pain on exam concordant with level of fracture, Failure of conservative therapy and pain refractory to narcotic pain mediation, Inability to tolerate narcotic pain medication due to side effects, and Significant disability on the [employer] Disability Questionnaire with 16/24 positive symptoms, reflecting significant impact/impairment of (ADLs)   ICD-10-CM Codes that Support Medical Necessity (WelshBlog.at.aspx?articleId=[email or web address])  M80.88XA    Other osteoporosis with current pathological fracture, vertebra(e), initial encounter for fracture   Plan:  bilateral sacral ala fractures, vertebral body augmentation with balloon Kyphoplasty  Post-procedure disposition: outpatient [facility] Medication holds: eliquis  2 days  The patient has suffered a fracture of the bilateral sacral ala fractures. It is recommended that patients aged 77 years or older be evaluated for possible testing or treatment of osteoporosis. A copy of this consult report is sent to the patient's referring physician.  Advanced Care Plan: The patient did not want to provide an Advanced Care Plan at the time of this visit     Total time spent on today's visit was over  30 Minutes , including both face-to-face time and non face-to-face time,  personally spent on review of chart (including labs and relevant imaging), discussing further workup and treatment options, referral to specialist if needed, reviewing outside records if pertinent, answering patient questions, and coordinating care regarding bilateral sacral ala fractures, as well as management strategy.    Thank you for this interesting consult.  I greatly enjoyed meeting LADON HENEY and look forward to participating in their care.  A copy of this report was sent to the requesting provider on this date.  Electronically Signed: Delon JAYSON Beagle 06/22/2024, 2:35 PM   I spent a total of  30 Minutes   in face to face in clinical consultation, greater than 50% of which was counseling/coordinating care for bilateral sacral ala fractures,

## 2024-06-23 NOTE — Telephone Encounter (Signed)
   Name:  Linda Brady  DOB:  11-15-1941  MRN:  [id number]   Primary Cardiologist: Redell Cave, MD  Chart reviewed as part of pre-operative protocol coverage. Patient was contacted 06/23/2024 in reference to pre-operative risk assessment for pending surgery as outlined below.  Linda Brady was last seen on 06/15/2024 by Ellouise Class, NP.    Has appointment with Dr. Cave on 07/16/2024.    RCRI score is 1 point so 1.1% risk of major cardiac event. Moderate risk due to HF, HTN, PAF, TAVR 01/25 and severe COPD. Ok to hold eliquis  for 4 doses as requested. Due to severe COPD, should get  pulmonology assessment prior to procedure.    Linda Satterfield, NP 06/23/2024, 9:04 AM

## 2024-06-24 ENCOUNTER — Other Ambulatory Visit: Payer: Self-pay | Admitting: Interventional Radiology

## 2024-06-24 DIAGNOSIS — M8008XA Age-related osteoporosis with current pathological fracture, vertebra(e), initial encounter for fracture: Secondary | ICD-10-CM

## 2024-06-25 ENCOUNTER — Ambulatory Visit: Payer: Self-pay | Admitting: Family

## 2024-06-25 ENCOUNTER — Other Ambulatory Visit
Admission: RE | Admit: 2024-06-25 | Discharge: 2024-06-25 | Disposition: A | Discharge status: Home / Self Care | Source: Ambulatory Visit | Attending: Family | Admitting: Family

## 2024-06-25 DIAGNOSIS — I5032 Chronic diastolic (congestive) heart failure: Secondary | ICD-10-CM | POA: Diagnosis present

## 2024-06-25 LAB — BASIC METABOLIC PANEL WITH GFR
Anion gap: 12 (ref 5–15)
BUN: 11 mg/dL (ref 8–23)
CO2: 29 mmol/L (ref 22–32)
Calcium: 9.3 mg/dL (ref 8.9–10.3)
Chloride: 96 mmol/L — ABNORMAL LOW (ref 98–111)
Creatinine, Ser: 0.98 mg/dL (ref 0.44–1.00)
GFR, Estimated: 58 mL/min — ABNORMAL LOW (ref >60)
Glucose, Bld: 105 mg/dL — ABNORMAL HIGH (ref 70–99)
Potassium: 3.3 mmol/L — ABNORMAL LOW (ref 3.5–5.1)
Sodium: 137 mmol/L (ref 135–145)

## 2024-06-25 MED ORDER — POTASSIUM CHLORIDE CRYS ER 20 MEQ PO TBCR: 40.0000 meq | EXTENDED_RELEASE_TABLET | Freq: Every day | ORAL | Status: AC

## 2024-06-25 NOTE — Telephone Encounter (Signed)
 Call and spoke pt and daughter. Daughter verbalized understanding of lab results and new orders.

## 2024-06-25 NOTE — Telephone Encounter (Signed)
-----   Message from Ellouise DELENA Class sent at 06/25/2024  1:33 PM EDT ----- Potassium is still a little low. Increase potassium to 40meq daily. Recheck BMET in 2 weeks.  ----- Message ----- From: Interface, Lab In [place] Sent: 06/25/2024  11:25 AM EDT To: Ellouise DELENA Class, FNP

## 2024-06-28 ENCOUNTER — Telehealth: Payer: Self-pay

## 2024-06-28 NOTE — Discharge Instructions (Signed)
 Sacroplasty Post Procedure Discharge Instructions  May resume a regular diet and any medications that you routinely take (including pain medications). However, if you are taking Aspirin  or an anticoagulant/blood thinner you will be told when you can resume taking these by the healthcare provider. No driving day of procedure. The day of your procedure take it easy. You may use an ice pack as needed to injection sites on back.  Ice to back 30 minutes on and 30 minutes off, as needed. May remove bandaids tomorrow after taking a shower. Replace daily with a clean bandaid until healed.  Do not lift anything heavier than a milk jug for 1-2 weeks or determined by your physician.  Follow up with your physician in 2 weeks.  If you need to speak to someone after hours, please call the on call IR physician at [phone].  Tell them you are a patient of Dr. Karalee and that you had a Sacroplasty today and the issues you are having.   Please contact our office at [phone] for the following symptoms or if you have any questions:  Fever greater than 100 degrees Increased swelling, pain, or redness at injection site. Increased back and/or leg pain New numbness or change in symptoms from before the procedure.    Thank you for visiting [facility]!

## 2024-06-29 ENCOUNTER — Inpatient Hospital Stay
Admission: RE | Admit: 2024-06-29 | Discharge: 2024-06-29 | Disposition: A | Discharge status: Home / Self Care | Source: Ambulatory Visit | Attending: Interventional Radiology | Admitting: Interventional Radiology

## 2024-06-29 DIAGNOSIS — M8008XA Age-related osteoporosis with current pathological fracture, vertebra(e), initial encounter for fracture: Secondary | ICD-10-CM

## 2024-06-29 HISTORY — PX: IR VERTEBROPLASTY LUMBAR BX INC UNI/BIL INC/INJECT/IMAGING: IMG5516

## 2024-06-29 MED ORDER — DIPHENHYDRAMINE HCL 50 MG/ML IJ SOLN
INTRAMUSCULAR | Status: AC | PRN
Start: 2024-06-29 — End: 2024-06-29
  Administered 2024-06-29: 12.5 mg via INTRAVENOUS

## 2024-06-29 MED ORDER — FENTANYL CITRATE PF 50 MCG/ML IJ SOSY: 25.0000 ug | PREFILLED_SYRINGE | INTRAMUSCULAR | Status: DC | PRN

## 2024-06-29 MED ORDER — MIDAZOLAM HCL 2 MG/2ML IJ SOLN: 1.0000 mg | INTRAMUSCULAR | Status: DC | PRN

## 2024-06-29 MED ORDER — CEFAZOLIN SODIUM-DEXTROSE 2-4 GM/100ML-% IV SOLN
2.0000 g | INTRAVENOUS | Status: AC
  Administered 2024-06-29: 2 g via INTRAVENOUS

## 2024-06-29 MED ORDER — LIDOCAINE HCL (PF) 1 % IJ SOLN
20.0000 mL | Freq: Once | INTRAMUSCULAR | Status: AC
  Administered 2024-06-29: 20 mL via INTRADERMAL

## 2024-06-29 MED ORDER — MIDAZOLAM HCL 2 MG/2ML IJ SOLN
INTRAMUSCULAR | Status: AC | PRN
  Administered 2024-06-29 (×2): 1 mg via INTRAVENOUS

## 2024-06-29 MED ORDER — SODIUM CHLORIDE 0.9 % IV SOLN: INTRAVENOUS | Status: DC

## 2024-06-29 MED ORDER — FENTANYL CITRATE (PF) 100 MCG/2ML IJ SOLN
INTRAMUSCULAR | Status: AC | PRN
  Administered 2024-06-29 (×4): 50 ug via INTRAVENOUS
  Administered 2024-06-29 (×2): 25 ug via INTRAVENOUS

## 2024-06-29 MED ORDER — ACETAMINOPHEN 10 MG/ML IV SOLN
1000.0000 mg | Freq: Once | INTRAVENOUS | Status: AC
  Administered 2024-06-29: 1000 mg via INTRAVENOUS

## 2024-06-30 ENCOUNTER — Telehealth: Payer: Self-pay

## 2024-07-06 ENCOUNTER — Telehealth: Payer: Self-pay

## 2024-07-07 ENCOUNTER — Other Ambulatory Visit: Payer: Self-pay | Admitting: Interventional Radiology

## 2024-07-07 ENCOUNTER — Telehealth: Payer: Self-pay

## 2024-07-07 DIAGNOSIS — M545 Low back pain, unspecified: Secondary | ICD-10-CM

## 2024-07-07 DIAGNOSIS — M533 Sacrococcygeal disorders, not elsewhere classified: Secondary | ICD-10-CM

## 2024-07-08 ENCOUNTER — Ambulatory Visit: Payer: Self-pay | Admitting: Family

## 2024-07-08 ENCOUNTER — Other Ambulatory Visit
Admission: RE | Admit: 2024-07-08 | Discharge: 2024-07-08 | Disposition: A | Discharge status: Home / Self Care | Attending: Family | Admitting: Family

## 2024-07-08 DIAGNOSIS — I5032 Chronic diastolic (congestive) heart failure: Secondary | ICD-10-CM | POA: Diagnosis present

## 2024-07-08 LAB — BASIC METABOLIC PANEL WITH GFR
Anion gap: 12 (ref 5–15)
BUN: 13 mg/dL (ref 8–23)
CO2: 28 mmol/L (ref 22–32)
Calcium: 9.5 mg/dL (ref 8.9–10.3)
Chloride: 96 mmol/L — ABNORMAL LOW (ref 98–111)
Creatinine, Ser: 0.99 mg/dL (ref 0.44–1.00)
GFR, Estimated: 58 mL/min — ABNORMAL LOW (ref >60)
Glucose, Bld: 120 mg/dL — ABNORMAL HIGH (ref 70–99)
Potassium: 4.1 mmol/L (ref 3.5–5.1)
Sodium: 136 mmol/L (ref 135–145)

## 2024-07-09 ENCOUNTER — Other Ambulatory Visit

## 2024-07-09 ENCOUNTER — Inpatient Hospital Stay: Admission: RE | Admit: 2024-07-09 | Source: Ambulatory Visit

## 2024-07-10 ENCOUNTER — Ambulatory Visit
Admission: RE | Admit: 2024-07-10 | Discharge: 2024-07-10 | Disposition: A | Discharge status: Home / Self Care | Source: Ambulatory Visit | Attending: Interventional Radiology | Admitting: Interventional Radiology

## 2024-07-10 DIAGNOSIS — M533 Sacrococcygeal disorders, not elsewhere classified: Secondary | ICD-10-CM

## 2024-07-10 DIAGNOSIS — M545 Low back pain, unspecified: Secondary | ICD-10-CM

## 2024-07-13 ENCOUNTER — Other Ambulatory Visit (HOSPITAL_COMMUNITY): Payer: Self-pay

## 2024-07-13 ENCOUNTER — Other Ambulatory Visit (HOSPITAL_COMMUNITY)

## 2024-07-13 ENCOUNTER — Ambulatory Visit

## 2024-07-16 ENCOUNTER — Ambulatory Visit: Admitting: Cardiology

## 2024-07-19 ENCOUNTER — Ambulatory Visit: Admitting: Cardiology

## 2024-07-27 ENCOUNTER — Other Ambulatory Visit: Payer: Self-pay

## 2024-07-27 ENCOUNTER — Encounter: Payer: Self-pay | Admitting: Internal Medicine

## 2024-07-27 ENCOUNTER — Inpatient Hospital Stay
Admission: EM | Admit: 2024-07-27 | Discharge: 2024-08-01 | DRG: 871 | Disposition: A | Discharge status: Home / Self Care | Attending: Hospitalist | Admitting: Hospitalist

## 2024-07-27 ENCOUNTER — Emergency Department

## 2024-07-27 DIAGNOSIS — I10 Essential (primary) hypertension: Secondary | ICD-10-CM | POA: Diagnosis present

## 2024-07-27 DIAGNOSIS — N179 Acute kidney failure, unspecified: Secondary | ICD-10-CM | POA: Diagnosis present

## 2024-07-27 DIAGNOSIS — I130 Hypertensive heart and chronic kidney disease with heart failure and stage 1 through stage 4 chronic kidney disease, or unspecified chronic kidney disease: Secondary | ICD-10-CM | POA: Diagnosis present

## 2024-07-27 DIAGNOSIS — I2489 Other forms of acute ischemic heart disease: Secondary | ICD-10-CM | POA: Diagnosis present

## 2024-07-27 DIAGNOSIS — I482 Chronic atrial fibrillation, unspecified: Secondary | ICD-10-CM | POA: Diagnosis present

## 2024-07-27 DIAGNOSIS — K219 Gastro-esophageal reflux disease without esophagitis: Secondary | ICD-10-CM | POA: Diagnosis present

## 2024-07-27 DIAGNOSIS — I4891 Unspecified atrial fibrillation: Secondary | ICD-10-CM | POA: Diagnosis not present

## 2024-07-27 DIAGNOSIS — Z9981 Dependence on supplemental oxygen: Secondary | ICD-10-CM | POA: Diagnosis not present

## 2024-07-27 DIAGNOSIS — J440 Chronic obstructive pulmonary disease with acute lower respiratory infection: Secondary | ICD-10-CM | POA: Diagnosis present

## 2024-07-27 DIAGNOSIS — Z952 Presence of prosthetic heart valve: Secondary | ICD-10-CM | POA: Diagnosis not present

## 2024-07-27 DIAGNOSIS — Z1152 Encounter for screening for COVID-19: Secondary | ICD-10-CM | POA: Diagnosis not present

## 2024-07-27 DIAGNOSIS — R531 Weakness: Secondary | ICD-10-CM

## 2024-07-27 DIAGNOSIS — J441 Chronic obstructive pulmonary disease with (acute) exacerbation: Principal | ICD-10-CM | POA: Diagnosis present

## 2024-07-27 DIAGNOSIS — R6520 Severe sepsis without septic shock: Secondary | ICD-10-CM | POA: Diagnosis present

## 2024-07-27 DIAGNOSIS — Z888 Allergy status to other drugs, medicaments and biological substances status: Secondary | ICD-10-CM

## 2024-07-27 DIAGNOSIS — E876 Hypokalemia: Secondary | ICD-10-CM | POA: Diagnosis present

## 2024-07-27 DIAGNOSIS — Z7951 Long term (current) use of inhaled steroids: Secondary | ICD-10-CM | POA: Diagnosis not present

## 2024-07-27 DIAGNOSIS — Z7901 Long term (current) use of anticoagulants: Secondary | ICD-10-CM

## 2024-07-27 DIAGNOSIS — A419 Sepsis, unspecified organism: Secondary | ICD-10-CM | POA: Diagnosis present

## 2024-07-27 DIAGNOSIS — E785 Hyperlipidemia, unspecified: Secondary | ICD-10-CM | POA: Diagnosis present

## 2024-07-27 DIAGNOSIS — Z961 Presence of intraocular lens: Secondary | ICD-10-CM | POA: Diagnosis present

## 2024-07-27 DIAGNOSIS — I5032 Chronic diastolic (congestive) heart failure: Secondary | ICD-10-CM | POA: Diagnosis present

## 2024-07-27 DIAGNOSIS — E669 Obesity, unspecified: Secondary | ICD-10-CM | POA: Diagnosis not present

## 2024-07-27 DIAGNOSIS — Z9842 Cataract extraction status, left eye: Secondary | ICD-10-CM

## 2024-07-27 DIAGNOSIS — I5A Non-ischemic myocardial injury (non-traumatic): Secondary | ICD-10-CM | POA: Diagnosis present

## 2024-07-27 DIAGNOSIS — N1831 Chronic kidney disease, stage 3a: Secondary | ICD-10-CM | POA: Diagnosis present

## 2024-07-27 DIAGNOSIS — Z7952 Long term (current) use of systemic steroids: Secondary | ICD-10-CM

## 2024-07-27 DIAGNOSIS — E039 Hypothyroidism, unspecified: Secondary | ICD-10-CM | POA: Diagnosis present

## 2024-07-27 DIAGNOSIS — J151 Pneumonia due to Pseudomonas: Secondary | ICD-10-CM | POA: Diagnosis present

## 2024-07-27 DIAGNOSIS — Z6831 Body mass index (BMI) 31.0-31.9, adult: Secondary | ICD-10-CM | POA: Diagnosis not present

## 2024-07-27 DIAGNOSIS — J9611 Chronic respiratory failure with hypoxia: Secondary | ICD-10-CM | POA: Diagnosis present

## 2024-07-27 DIAGNOSIS — Z79890 Hormone replacement therapy (postmenopausal): Secondary | ICD-10-CM

## 2024-07-27 DIAGNOSIS — E871 Hypo-osmolality and hyponatremia: Secondary | ICD-10-CM | POA: Diagnosis present

## 2024-07-27 DIAGNOSIS — A4152 Sepsis due to Pseudomonas: Principal | ICD-10-CM | POA: Diagnosis present

## 2024-07-27 DIAGNOSIS — Z79899 Other long term (current) drug therapy: Secondary | ICD-10-CM

## 2024-07-27 DIAGNOSIS — Z87891 Personal history of nicotine dependence: Secondary | ICD-10-CM

## 2024-07-27 DIAGNOSIS — E66811 Obesity, class 1: Secondary | ICD-10-CM | POA: Diagnosis present

## 2024-07-27 LAB — URINALYSIS, W/ REFLEX TO CULTURE (INFECTION SUSPECTED)
Bacteria, UA: NONE SEEN
Bilirubin Urine: NEGATIVE
Glucose, UA: >=500 mg/dL — AB
Hgb urine dipstick: NEGATIVE
Ketones, ur: NEGATIVE mg/dL
Leukocytes,Ua: NEGATIVE
Nitrite: NEGATIVE
Protein, ur: NEGATIVE mg/dL
Specific Gravity, Urine: 1.01 (ref 1.005–1.030)
pH: 7 (ref 5.0–8.0)

## 2024-07-27 LAB — LACTIC ACID, PLASMA: Lactic Acid, Venous: 1.1 mmol/L (ref 0.5–1.9)

## 2024-07-27 LAB — CBC WITH DIFFERENTIAL/PLATELET
Abs Immature Granulocytes: 0.1 K/uL — ABNORMAL HIGH (ref 0.00–0.07)
Basophils Absolute: 0 K/uL (ref 0.0–0.1)
Basophils Relative: 0 %
Eosinophils Absolute: 0 K/uL (ref 0.0–0.5)
Eosinophils Relative: 0 %
HCT: 31.3 % — ABNORMAL LOW (ref 36.0–46.0)
Hemoglobin: 9.6 g/dL — ABNORMAL LOW (ref 12.0–15.0)
Immature Granulocytes: 1 %
Lymphocytes Relative: 2 %
Lymphs Abs: 0.4 K/uL — ABNORMAL LOW (ref 0.7–4.0)
MCH: 24.4 pg — ABNORMAL LOW (ref 26.0–34.0)
MCHC: 30.7 g/dL (ref 30.0–36.0)
MCV: 79.6 fL — ABNORMAL LOW (ref 80.0–100.0)
Monocytes Absolute: 0.9 K/uL (ref 0.1–1.0)
Monocytes Relative: 6 %
Neutro Abs: 14.6 K/uL — ABNORMAL HIGH (ref 1.7–7.7)
Neutrophils Relative %: 91 %
Platelets: 474 K/uL — ABNORMAL HIGH (ref 150–400)
RBC: 3.93 MIL/uL (ref 3.87–5.11)
RDW: 17.9 % — ABNORMAL HIGH (ref 11.5–15.5)
WBC: 16 K/uL — ABNORMAL HIGH (ref 4.0–10.5)
nRBC: 0 % (ref 0.0–0.2)

## 2024-07-27 LAB — COMPREHENSIVE METABOLIC PANEL WITH GFR
ALT: 21 U/L (ref 0–44)
AST: 32 U/L (ref 15–41)
Albumin: 3.7 g/dL (ref 3.5–5.0)
Alkaline Phosphatase: 99 U/L (ref 38–126)
Anion gap: 11 (ref 5–15)
BUN: 16 mg/dL (ref 8–23)
CO2: 27 mmol/L (ref 22–32)
Calcium: 9.4 mg/dL (ref 8.9–10.3)
Chloride: 89 mmol/L — ABNORMAL LOW (ref 98–111)
Creatinine, Ser: 1.03 mg/dL — ABNORMAL HIGH (ref 0.44–1.00)
GFR, Estimated: 55 mL/min — ABNORMAL LOW (ref >60)
Glucose, Bld: 127 mg/dL — ABNORMAL HIGH (ref 70–99)
Potassium: 3.1 mmol/L — ABNORMAL LOW (ref 3.5–5.1)
Sodium: 127 mmol/L — ABNORMAL LOW (ref 135–145)
Total Bilirubin: 1 mg/dL (ref 0.0–1.2)
Total Protein: 7.2 g/dL (ref 6.5–8.1)

## 2024-07-27 LAB — PHOSPHORUS: Phosphorus: 3.1 mg/dL (ref 2.5–4.6)

## 2024-07-27 LAB — OSMOLALITY, URINE: Osmolality, Ur: 399 mosm/kg (ref 300–900)

## 2024-07-27 LAB — BASIC METABOLIC PANEL WITH GFR
Anion gap: 10 (ref 5–15)
BUN: 16 mg/dL (ref 8–23)
CO2: 24 mmol/L (ref 22–32)
Calcium: 9 mg/dL (ref 8.9–10.3)
Chloride: 94 mmol/L — ABNORMAL LOW (ref 98–111)
Creatinine, Ser: 1.11 mg/dL — ABNORMAL HIGH (ref 0.44–1.00)
GFR, Estimated: 50 mL/min — ABNORMAL LOW (ref >60)
Glucose, Bld: 135 mg/dL — ABNORMAL HIGH (ref 70–99)
Potassium: 3.5 mmol/L (ref 3.5–5.1)
Sodium: 128 mmol/L — ABNORMAL LOW (ref 135–145)

## 2024-07-27 LAB — EXPECTORATED SPUTUM ASSESSMENT W GRAM STAIN, RFLX TO RESP C

## 2024-07-27 LAB — BLOOD GAS, VENOUS
Acid-Base Excess: 5.5 mmol/L — ABNORMAL HIGH (ref 0.0–2.0)
Bicarbonate: 31.1 mmol/L — ABNORMAL HIGH (ref 20.0–28.0)
O2 Saturation: 60.6 %
Patient temperature: 37
pCO2, Ven: 48 mmHg (ref 44–60)
pH, Ven: 7.42 (ref 7.25–7.43)
pO2, Ven: 35 mmHg (ref 32–45)

## 2024-07-27 LAB — RESP PANEL BY RT-PCR (RSV, FLU A&B, COVID)  RVPGX2
Influenza A by PCR: NEGATIVE
Influenza B by PCR: NEGATIVE
Resp Syncytial Virus by PCR: NEGATIVE
SARS Coronavirus 2 by RT PCR: NEGATIVE

## 2024-07-27 LAB — LIPASE, BLOOD: Lipase: 35 U/L (ref 11–51)

## 2024-07-27 LAB — TROPONIN I (HIGH SENSITIVITY)
Troponin I (High Sensitivity): 40 ng/L — ABNORMAL HIGH (ref <18)
Troponin I (High Sensitivity): 47 ng/L — ABNORMAL HIGH (ref <18)

## 2024-07-27 LAB — PROTIME-INR
INR: 1.2 (ref 0.8–1.2)
Prothrombin Time: 16.1 s — ABNORMAL HIGH (ref 11.4–15.2)

## 2024-07-27 LAB — OSMOLALITY: Osmolality: 271 mosm/kg — ABNORMAL LOW (ref 275–295)

## 2024-07-27 LAB — BRAIN NATRIURETIC PEPTIDE: B Natriuretic Peptide: 73.1 pg/mL (ref 0.0–100.0)

## 2024-07-27 LAB — SODIUM, URINE, RANDOM: Sodium, Ur: 66 mmol/L

## 2024-07-27 LAB — MAGNESIUM: Magnesium: 1.7 mg/dL (ref 1.7–2.4)

## 2024-07-27 MED ORDER — ALPRAZOLAM 0.25 MG PO TABS
0.2500 mg | ORAL_TABLET | Freq: Every evening | ORAL | Status: DC | PRN
  Administered 2024-07-27 – 2024-07-30 (×4): 0.25 mg via ORAL
  Filled 2024-07-27 (×4): qty 1

## 2024-07-27 MED ORDER — APIXABAN 5 MG PO TABS
5.0000 mg | ORAL_TABLET | Freq: Two times a day (BID) | ORAL | Status: DC
Start: 2024-07-28 — End: 2024-08-01
  Administered 2024-07-27 – 2024-08-01 (×10): 5 mg via ORAL
  Filled 2024-07-27 (×10): qty 1

## 2024-07-27 MED ORDER — ROPINIROLE HCL 0.25 MG PO TABS
0.2500 mg | ORAL_TABLET | Freq: Every day | ORAL | Status: DC
Start: 2024-07-28 — End: 2024-08-01
  Administered 2024-07-27 – 2024-07-31 (×5): 0.25 mg via ORAL
  Filled 2024-07-27 (×5): qty 1

## 2024-07-27 MED ORDER — PIPERACILLIN-TAZOBACTAM 3.375 G IVPB
3.3750 g | Freq: Once | INTRAVENOUS | Status: AC
  Administered 2024-07-27: 3.375 g via INTRAVENOUS
  Filled 2024-07-27: qty 50

## 2024-07-27 MED ORDER — DM-GUAIFENESIN ER 30-600 MG PO TB12
1.0000 | ORAL_TABLET | Freq: Two times a day (BID) | ORAL | Status: DC | PRN
  Administered 2024-07-27 – 2024-07-31 (×3): 1 via ORAL
  Filled 2024-07-27 (×3): qty 1

## 2024-07-27 MED ORDER — ONDANSETRON HCL 4 MG/2ML IJ SOLN
4.0000 mg | Freq: Three times a day (TID) | INTRAMUSCULAR | Status: DC | PRN
  Administered 2024-07-29: 4 mg via INTRAVENOUS
  Filled 2024-07-27: qty 2

## 2024-07-27 MED ORDER — ACETAMINOPHEN 500 MG PO TABS
1000.0000 mg | ORAL_TABLET | Freq: Once | ORAL | Status: AC
  Administered 2024-07-27: 1000 mg via ORAL
  Filled 2024-07-27: qty 2

## 2024-07-27 MED ORDER — SODIUM CHLORIDE 1 G PO TABS
1.0000 g | ORAL_TABLET | Freq: Two times a day (BID) | ORAL | Status: DC
  Administered 2024-07-27 – 2024-08-01 (×10): 1 g via ORAL
  Filled 2024-07-27 (×10): qty 1

## 2024-07-27 MED ORDER — ATORVASTATIN CALCIUM 20 MG PO TABS
40.0000 mg | ORAL_TABLET | Freq: Every day | ORAL | Status: DC
  Administered 2024-07-28 – 2024-08-01 (×5): 40 mg via ORAL
  Filled 2024-07-27 (×5): qty 2

## 2024-07-27 MED ORDER — IPRATROPIUM-ALBUTEROL 0.5-2.5 (3) MG/3ML IN SOLN
3.0000 mL | RESPIRATORY_TRACT | Status: DC
  Administered 2024-07-27 – 2024-07-28 (×4): 3 mL via RESPIRATORY_TRACT
  Filled 2024-07-27 (×4): qty 3

## 2024-07-27 MED ORDER — IPRATROPIUM-ALBUTEROL 0.5-2.5 (3) MG/3ML IN SOLN
3.0000 mL | Freq: Once | RESPIRATORY_TRACT | Status: AC
  Administered 2024-07-27: 3 mL via RESPIRATORY_TRACT
  Filled 2024-07-27: qty 3

## 2024-07-27 MED ORDER — SODIUM CHLORIDE 0.9 % IV BOLUS
500.0000 mL | Freq: Once | INTRAVENOUS | Status: AC
  Administered 2024-07-27: 500 mL via INTRAVENOUS

## 2024-07-27 MED ORDER — SPIRONOLACTONE 25 MG PO TABS: 25.0000 mg | ORAL_TABLET | Freq: Every day | ORAL | Status: DC

## 2024-07-27 MED ORDER — VANCOMYCIN HCL 1750 MG/350ML IV SOLN: 1750.0000 mg | INTRAVENOUS | Status: DC

## 2024-07-27 MED ORDER — ALBUTEROL SULFATE HFA 108 (90 BASE) MCG/ACT IN AERS: 2.0000 | INHALATION_SPRAY | RESPIRATORY_TRACT | Status: DC | PRN

## 2024-07-27 MED ORDER — HYDRALAZINE HCL 20 MG/ML IJ SOLN: 5.0000 mg | INTRAMUSCULAR | Status: DC | PRN

## 2024-07-27 MED ORDER — VANCOMYCIN HCL 1750 MG/350ML IV SOLN
1750.0000 mg | Freq: Once | INTRAVENOUS | Status: AC
  Administered 2024-07-27: 1750 mg via INTRAVENOUS
  Filled 2024-07-27: qty 350

## 2024-07-27 MED ORDER — MONTELUKAST SODIUM 10 MG PO TABS
10.0000 mg | ORAL_TABLET | Freq: Every day | ORAL | Status: DC
  Administered 2024-07-27 – 2024-07-31 (×5): 10 mg via ORAL
  Filled 2024-07-27 (×5): qty 1

## 2024-07-27 MED ORDER — AMIODARONE HCL 200 MG PO TABS
200.0000 mg | ORAL_TABLET | Freq: Every day | ORAL | Status: DC
Start: 2024-07-28 — End: 2024-08-01
  Administered 2024-07-28 – 2024-08-01 (×5): 200 mg via ORAL
  Filled 2024-07-27 (×5): qty 1

## 2024-07-27 MED ORDER — POTASSIUM CHLORIDE 20 MEQ PO PACK
40.0000 meq | PACK | Freq: Once | ORAL | Status: AC
  Administered 2024-07-27: 40 meq via ORAL
  Filled 2024-07-27: qty 2

## 2024-07-27 MED ORDER — ACETAMINOPHEN 325 MG PO TABS
650.0000 mg | ORAL_TABLET | Freq: Four times a day (QID) | ORAL | Status: DC | PRN
  Administered 2024-07-27 – 2024-07-29 (×2): 650 mg via ORAL
  Filled 2024-07-27 (×2): qty 2

## 2024-07-27 MED ORDER — SODIUM CHLORIDE 0.9 % IV SOLN: INTRAVENOUS | Status: DC

## 2024-07-27 MED ORDER — SODIUM CHLORIDE 0.9 % IV SOLN
2.0000 g | Freq: Two times a day (BID) | INTRAVENOUS | Status: DC
  Administered 2024-07-28 – 2024-08-01 (×9): 2 g via INTRAVENOUS
  Filled 2024-07-27 (×10): qty 12.5

## 2024-07-27 NOTE — ED Notes (Signed)
 Patient was assisted to use restroom by this RN. Patient had steady gait with walker. Patient urinated and returned to bed without complications.

## 2024-07-27 NOTE — Progress Notes (Signed)
 ED Pharmacy Antibiotic Sign Off An antibiotic consult was received from an ED provider for vancomycin  per pharmacy dosing for sepsis. A chart review was completed to assess appropriateness.   The following one time order(s) were placed:  Vanc 1.75 g  IV x 1  Further antibiotic and/or antibiotic pharmacy consults should be ordered by the admitting provider if indicated.   Thank you for allowing pharmacy to be a part of this patient's care.   Lum VEAR Mania, [facility]  Clinical Pharmacist 07/27/24 6:00 PM

## 2024-07-27 NOTE — H&P (Signed)
 History and Physical    Linda Brady FMW:[id number] DOB: [birth year] DOA: 07/27/2024  Referring MD/NP/PA:   PCP: Valora Agent, MD   Patient coming from:  The patient is coming from home.     Chief Complaint: Cough, SOB  HPI: Linda Brady is a 83 y.o. female with medical history significant of HTN, HLD, COPD on 2 L oxygen, dCHF, hypothyroidism, CKD stage IIIa, A-fib on Eliquis , obesity, who presents with cough and SOB.  Patient states she has SOB and productive cough with yellow-colored sputum production for more than 1 week.  Her shortness of breath has been progressively worsening.  She also has fever and chills.  Her temperature is 100.6 in ED.  She states that her daughter had positive COVID test, but she was tested negative at home.  She also has some pain below right rib cage.  No nausea, vomiting, diarrhea or abdominal pain.  No symptoms of UTI.  Per her daughter (I called her daughter by phone), patient just finished a course of antibiotics few days ago, but she does not remember the name of antibiotics.  Data reviewed independently and ED Course: pt was found to have WBC 16.0, lactic acid 1.1, negative PCR for COVID, flu and RSV, potassium 3.1, magnesium  1.7, phosphorus 3.1, troponin 40 --> 47.  Temperature 100.6, blood pressure 120/64, heart rate 80, RR 24, oxygen saturation 100% on home level 2 L oxygen.  Chest x-ray showed cardiomegaly and vascular congestion, no infiltration.  Patient is admitted to telemetry bed as inpatient.  CXR: 1. Cardiomegaly with vascular and basilar interstitial prominence/scarring. 2. No acute chest process by plain radiography.  Stable exam.     EKG: I have personally reviewed.  Sinus rhythm, QTc 457, anteroseptal infarction pattern   Review of Systems:   General: no fevers, chills, no body weight gain, has poor appetite, has fatigue HEENT: no blurry vision, hearing changes or sore throat Respiratory: has dyspnea, coughing,  wheezing CV: has left sided chest pain below rib cage, no palpitations GI: no nausea, vomiting, abdominal pain, diarrhea, constipation GU: no dysuria, burning on urination, increased urinary frequency, hematuria  Ext: has leg edema Neuro: no unilateral weakness, numbness, or tingling, no vision change or hearing loss Skin: no rash, no skin tear. MSK: No muscle spasm, no deformity, no limitation of range of movement in spin Heme: No easy bruising.  Travel history: No recent long distant travel.   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 Wendel and Dr. Maryjane   Thyroid  disease     Past Surgical History:  Procedure Laterality Date   BREAST CYST ASPIRATION Left 10 plus yrs   benign-twice   CATARACT EXTRACTION W/PHACO Left 03/20/2023   Procedure: CATARACT EXTRACTION PHACO AND INTRAOCULAR LENS PLACEMENT (IOC) LEFT VISION BLUE;  Surgeon: Enola Feliciano Hugger, MD;  Location: [facility];  Service: Ophthalmology;  Laterality: Left;  21.02   01:38.2   INTRAOPERATIVE TRANSTHORACIC ECHOCARDIOGRAM N/A 12/16/2023   Procedure: INTRAOPERATIVE TRANSTHORACIC ECHOCARDIOGRAM;  Surgeon: Wendel Lurena POUR, MD;  Location: [facility];  Service: Cardiovascular;  Laterality: N/A;   IR RADIOLOGIST EVAL & MGMT  06/22/2024   IR VERTEBROPLASTY LUMBAR BX INC UNI/BIL INC/INJECT/IMAGING  06/29/2024   LEFT HEART CATH AND CORONARY ANGIOGRAPHY N/A 09/11/2020   Procedure: LEFT HEART CATH AND CORONARY ANGIOGRAPHY;  Surgeon: Ammon Blunt, MD;  Location: [facility] INVASIVE CV [facility];  Service: Cardiovascular;  Laterality:  N/A;   PERIPHERAL VASCULAR INTERVENTION Right 10/24/2023   Procedure: PERIPHERAL VASCULAR INTERVENTION;  Surgeon: Magda Debby SAILOR, MD;  Location: [facility];  Service: Cardiovascular;  Laterality: Right;  right axillary stent   RIGHT/LEFT HEART CATH AND CORONARY ANGIOGRAPHY N/A 10/23/2023   Procedure: RIGHT/LEFT HEART CATH AND CORONARY ANGIOGRAPHY;  Surgeon: Wendel Lurena POUR, MD;  Location: [facility];  Service: Cardiovascular;  Laterality: N/A;   UPPER EXTREMITY ANGIOGRAPHY Right 10/24/2023   Procedure: Upper Extremity Angiography;  Surgeon: Magda Debby SAILOR, MD;  [facility];  Service: Cardiovascular;  Laterality: Right;    Social History:  reports that she has quit smoking. Her smoking use included cigarettes. She has a 6 pack-year smoking history. She has never used smokeless tobacco. She reports that she does not currently use alcohol. She reports that she does not currently use drugs.  Family History:  Family History  Problem Relation Age of Onset   Breast cancer Neg Hx      Prior to Admission medications   Medication Sig Start Date End Date Taking? Authorizing Provider  ALPRAZolam  (XANAX ) 0.25 MG tablet Take 0.25 mg by mouth at bedtime as needed for sleep.    [provider]  amiodarone  (PACERONE ) 200 MG tablet Take 1 tablet (200 mg total) by mouth daily. 01/16/24   Sebastian Lamarr SAUNDERS, PA-C  apixaban  (ELIQUIS ) 5 MG TABS tablet Take 5 mg by mouth 2 (two) times daily.    [provider]  atorvastatin  (LIPITOR) 40 MG tablet Take 1 tablet (40 mg total) by mouth daily. 09/20/23   Alexander, Natalie, DO  cholecalciferol  (VITAMIN D3) 25 MCG (1000 UNIT) tablet Take 1,000 Units by mouth daily.    [provider]  dapagliflozin  propanediol (FARXIGA ) 10 MG TABS tablet Take 1 tablet (10 mg total) by mouth daily before breakfast. 05/26/24   Donette Ellouise LABOR, FNP  FLUoxetine  (PROZAC ) 10 MG capsule Take 2 capsules (20 mg total) by mouth  daily as needed (anxiety). 11/17/23 06/15/24  Viviann Pastor, MD  furosemide  (LASIX ) 20 MG tablet Take 1 tablet (20 mg total) by mouth daily. 04/24/24 07/23/24  Wouk, Devaughn Sayres, MD  Ipratropium-Albuterol  (COMBIVENT  RESPIMAT) 20-100 MCG/ACT AERS respimat Inhale 1 puff into the lungs every 6 (six) hours as needed for wheezing.    [provider]  isosorbide  mononitrate (IMDUR ) 30 MG 24 hr tablet Take 1 tablet (30 mg total) by mouth daily. 10/14/22   Furth, Cadence H, PA-C  levalbuterol  (XOPENEX ) 1.25 MG/3ML nebulizer solution Take 1.25 mg by nebulization every 6 (six) hours as needed. 12/08/23 12/07/24  [provider]  levothyroxine  (SYNTHROID ) 75 MCG tablet Take 75 mcg by mouth daily before breakfast. 05/02/20   [provider]  mometasone -formoterol  (DULERA ) 200-5 MCG/ACT AERO Inhale 2 puffs into the lungs 2 (two) times daily. 12/17/23   Sebastian Lamarr SAUNDERS, PA-C  montelukast  (SINGULAIR ) 10 MG tablet Take 1 tablet by mouth at bedtime. 02/03/24 02/02/25  [provider]  pantoprazole  (PROTONIX ) 40 MG tablet Take 1 tablet (40 mg total) by mouth 2 (two) times daily. 11/17/23 06/15/24  Viviann Pastor, MD  potassium chloride  SA (KLOR-CON  M) 20  MEQ tablet Take 2 tablets (40 mEq total) by mouth daily. 06/25/24   Donette Ellouise LABOR, FNP  predniSONE  (DELTASONE ) 5 MG tablet Take 5 mg by mouth daily with breakfast.    [provider]  rOPINIRole  (REQUIP ) 0.25 MG tablet Take 1 tablet (0.25 mg total) by mouth at bedtime. 10/28/23 06/15/24  Leotis Bogus, MD  spironolactone  (ALDACTONE ) 25 MG tablet Take 1 tablet (25 mg total) by mouth daily. 06/18/24 09/16/24  Donette Ellouise LABOR, FNP  sucralfate  (CARAFATE ) 1 g tablet Take 1 tablet (1 g total) by mouth 4 (four) times daily. 11/17/23   Viviann Pastor, MD  vitamin B-12 (CYANOCOBALAMIN ) 1000 MCG tablet Take 1,000 mcg by mouth daily.    [provider]    Physical Exam: Vitals:   07/27/24 1845 07/27/24 2006 07/27/24 2046  07/28/24 0042  BP: (!) 102/52 125/67    Pulse: 89 87    Resp: (!) 26 (!) 22    Temp:  98.6 F (37 C)    TempSrc:  Oral    SpO2: 99% 97% 96% 98%  Weight:      Height:       General: Not in acute distress HEENT:       Eyes: PERRL, EOMI, no jaundice       ENT: No discharge from the ears and nose, no pharynx injection, no tonsillar enlargement.        Neck: No JVD, no bruit, no mass felt. Heme: No neck lymph node enlargement. Cardiac: S1/S2, RRR, No gallops or rubs. Respiratory: Has rhonchi and wheezing bilaterally GI: Soft, nondistended, nontender, no rebound pain, no organomegaly, BS present. GU: No hematuria Ext: Has trace leg edema bilaterally. 1+DP/PT pulse bilaterally. Musculoskeletal: No joint deformities, No joint redness or warmth, no limitation of ROM in spin. Skin: No rashes.  Neuro: Alert, oriented X3, cranial nerves II-XII grossly intact, moves all extremities normally.  Psych: Patient is not psychotic, no suicidal or hemocidal ideation.  Labs on Admission: I have personally reviewed following labs and imaging studies  CBC: Recent Labs  Lab 07/27/24 1635  WBC 16.0*  NEUTROABS 14.6*  HGB 9.6*  HCT 31.3*  MCV 79.6*  PLT 474*   Basic Metabolic Panel: Recent Labs  Lab 07/27/24 1635 07/27/24 2043  NA 127* 128*  K 3.1* 3.5  CL 89* 94*  CO2 27 24  GLUCOSE 127* 135*  BUN 16 16  CREATININE 1.03* 1.11*  CALCIUM  9.4 9.0  MG 1.7  --   PHOS 3.1  --    GFR: Estimated Creatinine Clearance: 40.5 mL/min (A) (by C-G formula based on SCr of 1.11 mg/dL (H)). Liver Function Tests: Recent Labs  Lab 07/27/24 1635  AST 32  ALT 21  ALKPHOS 99  BILITOT 1.0  PROT 7.2  ALBUMIN 3.7   Recent Labs  Lab 07/27/24 1635  LIPASE 35   No results for input(s): AMMONIA in the last 168 hours. Coagulation Profile: Recent Labs  Lab 07/27/24 1635  INR 1.2   Cardiac Enzymes: No results for input(s): CKTOTAL, CKMB, CKMBINDEX, TROPONINI in the last 168  hours. BNP (last 3 results) Recent Labs    10/15/23 1521 12/26/23 1104  PROBNP 533 323   HbA1C: No results for input(s): HGBA1C in the last 72 hours. CBG: No results for input(s): GLUCAP in the last 168 hours. Lipid Profile: No results for input(s): CHOL, HDL, LDLCALC, TRIG, CHOLHDL, LDLDIRECT in the last 72 hours. Thyroid  Function Tests: No results for input(s): TSH, T4TOTAL, FREET4, T3FREE, THYROIDAB in  the last 72 hours. Anemia Panel: No results for input(s): VITAMINB12, FOLATE, FERRITIN, TIBC, IRON, RETICCTPCT in the last 72 hours. Urine analysis:    Component Value Date/Time   COLORURINE STRAW (A) 07/27/2024 1828   APPEARANCEUR CLEAR (A) 07/27/2024 1828   APPEARANCEUR Hazy 11/12/2012 1355   LABSPEC 1.010 07/27/2024 1828   LABSPEC 1.005 11/12/2012 1355   PHURINE 7.0 07/27/2024 1828   GLUCOSEU >=500 (A) 07/27/2024 1828   GLUCOSEU Negative 11/12/2012 1355   HGBUR NEGATIVE 07/27/2024 1828   BILIRUBINUR NEGATIVE 07/27/2024 1828   BILIRUBINUR Negative 11/12/2012 1355   KETONESUR NEGATIVE 07/27/2024 1828   PROTEINUR NEGATIVE 07/27/2024 1828   NITRITE NEGATIVE 07/27/2024 1828   LEUKOCYTESUR NEGATIVE 07/27/2024 1828   LEUKOCYTESUR Trace 11/12/2012 1355   Sepsis Labs: @LABRCNTIP (procalcitonin:4,lacticidven:4) ) Recent Results (from the past 240 hours)  Resp panel by RT-PCR (RSV, Flu A&B, Covid) Anterior Nasal Swab     Status: None   Collection Time: 07/27/24  4:35 PM   Specimen: Anterior Nasal Swab  Result Value Ref Range Status   SARS Coronavirus 2 by RT PCR NEGATIVE NEGATIVE Final    Comment: (NOTE) SARS-CoV-2 target nucleic acids are NOT DETECTED.  The SARS-CoV-2 RNA is generally detectable in upper respiratory specimens during the acute phase of infection. The lowest concentration of SARS-CoV-2 viral copies this assay can detect is 138 copies/mL. A negative result does not preclude SARS-Cov-2 infection and should not be used  as the sole basis for treatment or other patient management decisions. A negative result may occur with  improper specimen collection/handling, submission of specimen other than nasopharyngeal swab, presence of viral mutation(s) within the areas targeted by this assay, and inadequate number of viral copies(<138 copies/mL). A negative result must be combined with clinical observations, patient history, and epidemiological information. The expected result is Negative.  Fact Sheet for Patients:  BloggerCourse.com  Fact Sheet for Healthcare Providers:  SeriousBroker.it  This test is no t yet approved or cleared by the [place]  FDA and  has been authorized for detection and/or diagnosis of SARS-CoV-2 by FDA under an Emergency Use Authorization (EUA). This EUA will remain  in effect (meaning this test can be used) for the duration of the COVID-19 declaration under Section 564(b)(1) of the Act, 21 U.S.C.section 360bbb-3(b)(1), unless the authorization is terminated  or revoked sooner.       Influenza A by PCR NEGATIVE NEGATIVE Final   Influenza B by PCR NEGATIVE NEGATIVE Final    Comment: (NOTE) The Xpert Xpress SARS-CoV-2/FLU/RSV plus assay is intended as an aid in the diagnosis of influenza from Nasopharyngeal swab specimens and should not be used as a sole basis for treatment. Nasal washings and aspirates are unacceptable for Xpert Xpress SARS-CoV-2/FLU/RSV testing.  Fact Sheet for Patients: BloggerCourse.com  Fact Sheet for Healthcare Providers: SeriousBroker.it  This test is not yet approved or cleared by the [place]  FDA and has been authorized for detection and/or diagnosis of SARS-CoV-2 by FDA under an Emergency Use Authorization (EUA). This EUA will remain in effect (meaning this test can be used) for the duration of the COVID-19 declaration under Section 564(b)(1)  of the Act, 21 U.S.C. section 360bbb-3(b)(1), unless the authorization is terminated or revoked.     Resp Syncytial Virus by PCR NEGATIVE NEGATIVE Final    Comment: (NOTE) Fact Sheet for Patients: BloggerCourse.com  Fact Sheet for Healthcare Providers: SeriousBroker.it  This test is not yet approved or cleared by the [place]  FDA and has been authorized for detection and/or  diagnosis of SARS-CoV-2 by FDA under an Emergency Use Authorization (EUA). This EUA will remain in effect (meaning this test can be used) for the duration of the COVID-19 declaration under Section 564(b)(1) of the Act, 21 U.S.C. section 360bbb-3(b)(1), unless the authorization is terminated or revoked.  Performed at [facility], [address] [place], [place] [address]   Expectorated Sputum Assessment w Gram Stain, Rflx to Resp Cult     Status: None   Collection Time: 07/27/24  9:38 PM   Specimen: Expectorated Sputum  Result Value Ref Range Status   Specimen Description EXPECTORATED SPUTUM  Final   Special Requests NONE  Final   Sputum evaluation   Final    THIS SPECIMEN IS ACCEPTABLE FOR SPUTUM CULTURE Performed at [facility], [address], [place], [place] [address]    Report Status 07/27/2024 FINAL  Final     Radiological Exams on Admission:   Assessment/Plan Principal Problem:   COPD exacerbation (HCC) Active Problems:   Sepsis (HCC)   Myocardial injury   Hyponatremia   Essential hypertension   Dyslipidemia   Chronic heart failure with preserved ejection fraction (HFpEF) (HCC)   Afib (HCC)   Hypokalemia   Hypothyroidism   Obesity (BMI 30-39.9)   Assessment and Plan:  Sepsis due to COPD exacerbation:  pt meets criteria for sepsis with WBC 16.0, fever of 100.6, RR up to 24.  Lactic acid normal.  Currently hemodynamically stable.  Patient recently completed course of antibiotics.  Patient may have early stage  of pneumonia which was not shown on chest x-ray.  Patient reports left lower chest pain below rib cage.  Patient is on Eliquis , low suspicions for PE.  Patient has sepsis clinically more like to have early stage of pneumonia.  - Will admit to tele bed as inpt - IV Vancomycin  and cefepime  (patient received 1 dose of Zosyn  in ED) - Incentive spirometry - Solu-Medrol  80 mg daily - Mucinex  for cough  - Bronchodilators - Urine legionella and S. pneumococcal antigen - Follow up blood culture x2, sputum culture - IVF: 500 ml of NS bolus in ED, followed by 75 mL per hour of NS (patient has CHF, limiting aggressive IV fluids treatment)  Myocardial injury: Troponin 40- > 47, likely demand ischemia. -Trend troponin - As needed Percocet, Tylenol  for pain - Patient is on Eliquis , will not give aspirin .  Hyponatremia:: Sodium 127 - Will check urine sodium, urine osmolality, serum osmolality. - Fluid restriction - IVF: as above - Sodium chloride  tablet 1 g twice daily - f/u by BMP q8h  Essential hypertension: - IV hydralazine  as needed - Hold Lasix  and spironolactone  due to hyponatremia  Dyslipidemia -Lipitor  Chronic heart failure with preserved ejection fraction (HFpEF) (HCC): 2D echo on 01/16/2024 showed EF 60 to 65%.  Patient has trace leg edema, BNP normal 73, CHF seems to be compensated. - Hold Lasix  spironolactone  due to hyponatremia - Watch volume status closely  Afib-chronic: Heart rate 80s. -Continue amiodarone  and Eliquis   Hypokalemia: Potassium 3.1.  Magnesium  and phosphorus normal - Repleted potassium  Hypothyroidism -Synthroid   Obesity (BMI 30-39.9): Patient has Obesity Class I, with body weight 79.8 Kg and BMI 31.18 kg/m2.  - Encourage losing weight - Exercise and healthy diet        DVT ppx: on Eliquis   Code Status: Full code   Family Communication: Yes, patient's daughter by phone     Disposition Plan:  Anticipate discharge back to previous  environment  Consults called:  None  Admission status and Level of care: Telemetry Medical:  as inpt        Dispo: The patient is from: Home              Anticipated d/c is to: Home              Anticipated d/c date is: 2 days              Patient currently is not medically stable to d/c.    Severity of Illness:  The appropriate patient status for this patient is INPATIENT. Inpatient status is judged to be reasonable and necessary in order to provide the required intensity of service to ensure the patient's safety. The patient's presenting symptoms, physical exam findings, and initial radiographic and laboratory data in the context of their chronic comorbidities is felt to place them at high risk for further clinical deterioration. Furthermore, it is not anticipated that the patient will be medically stable for discharge from the hospital within 2 midnights of admission.   * I certify that at the point of admission it is my clinical judgment that the patient will require inpatient hospital care spanning beyond 2 midnights from the point of admission due to high intensity of service, high risk for further deterioration and high frequency of surveillance required.*       Date of Service 07/28/2024    Toshia Larkin [facility]   If 7PM-7AM, please contact night-coverage [email or web address] 07/28/2024, 1:01 AM

## 2024-07-27 NOTE — ED Triage Notes (Signed)
 Patient to ED via [facility] from home. Patient lives home alone. Patient complaining of SOB and cough x1 week. Patient wears 2L [place] at baseline. Patient has been coughing up yellow mucus. Patient has been around daughter who is COVID positive. Patient is currently complaining of CP.   EMS Vitals: 187/98 80 HR 97% on 2L [place] 122 CBG

## 2024-07-27 NOTE — ED Notes (Signed)
 MD Hilma notified of patient's decreased BP.

## 2024-07-27 NOTE — Progress Notes (Signed)
 Sputum cup at bedside, patient provided with instructions. NO sputum at this time

## 2024-07-27 NOTE — ED Provider Notes (Signed)
 [facility] Provider Note    Event Date/Time   First MD Initiated Contact with Patient 07/27/24 1633     (approximate)   History   Shortness of Breath  Patient to ED via [facility] from home. Patient lives home alone. Patient complaining of SOB and cough x1 week. Patient wears 2L [place] at baseline. Patient has been coughing up yellow mucus. Patient has been around daughter who is COVID positive. Patient is currently complaining of CP.   EMS Vitals: 187/98 80 HR 97% on 2L [place] 122 CBG   HPI Linda Brady is a 83 y.o. female PMH CHF, COPD, status post TAVR, A-fib on Eliquis , anxiety, hyperlipidemia, hypertension presents for evaluation of cough, shortness of breath, chest pain, weakness - Patient states she has been feeling more short of breath than usual and coughing more than usual over the past week.  No hemoptysis.  Her granddaughter is recently recovering from COVID.  Says she took a COVID test at home today which was negative. - Secondly has been noticing some chest discomfort over the past week.  Largely with coughing but also notices that at rest. - Feels that her legs are somewhat swollen compared to usual - Lives alone.  Has been feeling too weak to get out of bed including to go to the bathroom to eat. - Does endorse mild lower abdominal discomfort and bilateral flank discomfort  Per chart review, EF 60% on echo from 01/2024.     Physical Exam   Triage Vital Signs: ED Triage Vitals  Encounter Vitals Group     BP 07/27/24 1630 (!) 160/67     Girls Systolic BP Percentile --      Girls Diastolic BP Percentile --      Boys Systolic BP Percentile --      Boys Diastolic BP Percentile --      Pulse Rate 07/27/24 1630 82     Resp 07/27/24 1630 (!) 24     Temp 07/27/24 1630 (!) 100.6 F (38.1 C)     Temp Source 07/27/24 1630 Oral     SpO2 07/27/24 1625 97 %     Weight 07/27/24 1631 176 lb (79.8 kg)     Height 07/27/24 1631 5' 3 (1.6 m)     Head  Circumference --      Peak Flow --      Pain Score 07/27/24 1631 10     Pain Loc --      Pain Education --      Exclude from Growth Chart --     Most recent vital signs: Vitals:   07/27/24 1821 07/27/24 1845  BP:  (!) 102/52  Pulse:  89  Resp:  (!) 26  Temp: 98.5 F (36.9 C)   SpO2:  99%     General: Awake, no distress.  CV:  Good peripheral perfusion. RRR, RP 2+, mild bilateral lower extremity edema Resp:  Tachypneic, speaking full sentences, diminished airflow with end expiratory wheezing throughout, no focal coarse breath sounds, no crackles Abd:  No distention. Nontender to deep palpation throughout Other:     ED Results / Procedures / Treatments   Labs (all labs ordered are listed, but only abnormal results are displayed) Labs Reviewed  COMPREHENSIVE METABOLIC PANEL WITH GFR - Abnormal; Notable for the following components:      Result Value   Sodium 127 (*)    Potassium 3.1 (*)    Chloride 89 (*)    Glucose, Bld  127 (*)    Creatinine, Ser 1.03 (*)    GFR, Estimated 55 (*)    All other components within normal limits  CBC WITH DIFFERENTIAL/PLATELET - Abnormal; Notable for the following components:   WBC 16.0 (*)    Hemoglobin 9.6 (*)    HCT 31.3 (*)    MCV 79.6 (*)    MCH 24.4 (*)    RDW 17.9 (*)    Platelets 474 (*)    Neutro Abs 14.6 (*)    Lymphs Abs 0.4 (*)    Abs Immature Granulocytes 0.10 (*)    All other components within normal limits  PROTIME-INR - Abnormal; Notable for the following components:   Prothrombin Time 16.1 (*)    All other components within normal limits  BLOOD GAS, VENOUS - Abnormal; Notable for the following components:   Bicarbonate 31.1 (*)    Acid-Base Excess 5.5 (*)    All other components within normal limits  RESP PANEL BY RT-PCR (RSV, FLU A&B, COVID)  RVPGX2  CULTURE, BLOOD (ROUTINE X 2)  CULTURE, BLOOD (ROUTINE X 2)  LACTIC ACID, PLASMA  BRAIN NATRIURETIC PEPTIDE  LIPASE, BLOOD  URINALYSIS, W/ REFLEX TO CULTURE  (INFECTION SUSPECTED)  OSMOLALITY  OSMOLALITY, URINE  SODIUM, URINE, RANDOM  MAGNESIUM   PHOSPHORUS  TROPONIN I (HIGH SENSITIVITY)  TROPONIN I (HIGH SENSITIVITY)     EKG  Ecg = sinus rhythm, rate 81, no gross ST elevation or depression, no significant repolarization abnormality, normal axis, normal intervals.  No evidence of ischemia or arrhythmia on my interpretation.   RADIOLOGY Interpreted by myself radiology report reviewed.  Possible pneumonia.    PROCEDURES:  Critical Care performed: Yes, see critical care procedure note(s)  Procedures   MEDICATIONS ORDERED IN ED: Medications  piperacillin -tazobactam (ZOSYN ) IVPB 3.375 g (3.375 g Intravenous New Bag/Given 07/27/24 1836)  vancomycin  (VANCOREADY) IVPB 1750 mg/350 mL (has no administration in time range)  acetaminophen  (TYLENOL ) tablet 1,000 mg (1,000 mg Oral Given 07/27/24 1646)  ipratropium-albuterol  (DUONEB) 0.5-2.5 (3) MG/3ML nebulizer solution 3 mL (3 mLs Nebulization Given 07/27/24 1701)  ipratropium-albuterol  (DUONEB) 0.5-2.5 (3) MG/3ML nebulizer solution 3 mL (3 mLs Nebulization Given 07/27/24 1701)  potassium chloride  (KLOR-CON ) packet 40 mEq (40 mEq Oral Given 07/27/24 1833)  sodium chloride  0.9 % bolus 500 mL (500 mLs Intravenous New Bag/Given 07/27/24 1834)     IMPRESSION / MDM / ASSESSMENT AND PLAN / ED COURSE  I reviewed the triage vital signs and the nursing notes.                              DDX/MDM/AP: Differential diagnosis includes, but is not limited to, COPD exacerbation, consider component of CHF exacerbation though less consistent on my exam, do suspect underlying viral syndrome with likely underlying COVID-19, consider underlying pneumonia, consider ACS, clinically doubt PE.  Consider underlying UTI.  Do not suspect acute intra-abdominal pathology at this time.  Plan: - Labs - Tylenol  - Chest x-ray - Deferring IV fluid as patient is normotensive and does have a history of heart failure, does  not appear grossly dehydrated on my eval - DuoNeb - Consider steroids - Will defer empiric antibiotics at this time given high suspicion for COVID-19 and no other clear bacterial source as of yet, low threshold to initiate as needed - Anticipate admission  Patient's presentation is most consistent with acute presentation with potential threat to life or bodily function.  The patient is on the cardiac  monitor to evaluate for evidence of arrhythmia and/or significant heart rate changes.  ED course below.  Workup with leukocytosis to 16.  No clear underlying source of infection at this time, urinalysis pending.  Chest x-ray with possible pneumonia.  CMP with hyponatremia, suspect in the setting of dehydration.  Viral swab negative.  Treating empirically for sepsis with vancomycin  and Zosyn , given reduced modified fluid bolus of 500 cc given desire to avoid hypervolemia and hyponatremia here.  Suspect hyponatremia given the patient's generalized weakness.  Admitted to hospitalist service.  Clinical Course as of 07/27/24 1906  Tue Jul 27, 2024  1654 VBG reassuring [MM]  1722 CBC with leukocytosis to 16  This is new for patient [MM]  1759 Chest x-ray with no obvious consolidation on my read, formal read pending  Viral swab still in process, given notable leukocytosis here we will treat empirically with broad-spectrum antibiotics [MM]  1759 Clinically doubt CHF exacerbation at this time.  Will give small bolus IV fluid, will not give aggressive fluids in the setting of notable hyponatremia.  No mental status changes, no seizures, no indication for hypertonic at this time [MM]  1801 Hospitalist consult order placed [MM]  1816 CXR: IMPRESSION: 1. Cardiomegaly with vascular and basilar interstitial prominence/scarring. 2. No acute chest process by plain radiography.  Stable exam.   [MM]  1854 Viral swab neg [MM]    Clinical Course User Index [MM] Clarine Ozell LABOR, MD     FINAL CLINICAL  IMPRESSION(S) / ED DIAGNOSES   Final diagnoses:  COPD exacerbation (HCC)  Sepsis, due to unspecified organism, unspecified whether acute organ dysfunction present (HCC)  Hyponatremia  Weakness     Rx / DC Orders   ED Discharge Orders     None        Note:  This document was prepared using Dragon voice recognition software and may include unintentional dictation errors.   Clarine Ozell LABOR, MD 07/27/24 [phone]

## 2024-07-27 NOTE — Consult Note (Signed)
 Pharmacy Antibiotic Note  Linda Brady is a 83 y.o. female admitted on 07/27/2024 with chest pain and shortness of breath.  Pharmacy has been consulted for Vancomycin  dosing. Patient is also receiving IV Cefepime .  Plan: Vancomycin  1750mg  IV x 1 as loading dose(given in ED), followed by: Vancomycin  1750 mg IV Q 48 hrs. Goal AUC 400-550. Expected AUC: 473.6 Expected Cmin: 8.8 SCr used: 1.11(b/l~1), Vd used: 0.72   Height: 5' 3 (160 cm) Weight: 79.8 kg (176 lb) IBW/kg (Calculated) : 52.4  Temp (24hrs), Avg:99.2 F (37.3 C), Min:98.5 F (36.9 C), Max:100.6 F (38.1 C)  Recent Labs  Lab 07/27/24 1635 07/27/24 2043  WBC 16.0*  --   CREATININE 1.03* 1.11*  LATICACIDVEN 1.1  --     Estimated Creatinine Clearance: 40.5 mL/min (A) (by C-G formula based on SCr of 1.11 mg/dL (H)).    Allergies  Allergen Reactions   Naproxen Sodium Anaphylaxis    Antimicrobials this admission: Vancomycin  8/26 >>  Cefepime  8/27  >>  Zosyn  8/26 x 1  Dose adjustments this admission: N/A  Microbiology results: 8/26 BCx: collected 8/26 Sputum: negative  8/26 MRSA PCR: ordered  Thank you for allowing pharmacy to be a part of this patient's care.  Brytney Somes A Ormond Lazo 07/27/2024 11:40 PM

## 2024-07-28 DIAGNOSIS — I5A Non-ischemic myocardial injury (non-traumatic): Secondary | ICD-10-CM | POA: Diagnosis present

## 2024-07-28 DIAGNOSIS — J441 Chronic obstructive pulmonary disease with (acute) exacerbation: Secondary | ICD-10-CM | POA: Diagnosis not present

## 2024-07-28 LAB — CBC
HCT: 29.6 % — ABNORMAL LOW (ref 36.0–46.0)
Hemoglobin: 9 g/dL — ABNORMAL LOW (ref 12.0–15.0)
MCH: 25.2 pg — ABNORMAL LOW (ref 26.0–34.0)
MCHC: 30.4 g/dL (ref 30.0–36.0)
MCV: 82.9 fL (ref 80.0–100.0)
Platelets: 419 K/uL — ABNORMAL HIGH (ref 150–400)
RBC: 3.57 MIL/uL — ABNORMAL LOW (ref 3.87–5.11)
RDW: 18.5 % — ABNORMAL HIGH (ref 11.5–15.5)
WBC: 26.3 K/uL — ABNORMAL HIGH (ref 4.0–10.5)
nRBC: 0 % (ref 0.0–0.2)

## 2024-07-28 LAB — BLOOD CULTURE ID PANEL (REFLEXED) - BCID2

## 2024-07-28 LAB — BASIC METABOLIC PANEL WITH GFR
Anion gap: 8 (ref 5–15)
Anion gap: 12 (ref 5–15)
Anion gap: 9 (ref 5–15)
BUN: 16 mg/dL (ref 8–23)
BUN: 13 mg/dL (ref 8–23)
BUN: 18 mg/dL (ref 8–23)
CO2: 25 mmol/L (ref 22–32)
CO2: 23 mmol/L (ref 22–32)
CO2: 23 mmol/L (ref 22–32)
Calcium: 9.3 mg/dL (ref 8.9–10.3)
Calcium: 9.4 mg/dL (ref 8.9–10.3)
Calcium: 9.4 mg/dL (ref 8.9–10.3)
Chloride: 100 mmol/L (ref 98–111)
Chloride: 98 mmol/L (ref 98–111)
Chloride: 100 mmol/L (ref 98–111)
Creatinine, Ser: 1.24 mg/dL — ABNORMAL HIGH (ref 0.44–1.00)
Creatinine, Ser: 0.94 mg/dL (ref 0.44–1.00)
Creatinine, Ser: 1.07 mg/dL — ABNORMAL HIGH (ref 0.44–1.00)
GFR, Estimated: 44 mL/min — ABNORMAL LOW (ref >60)
GFR, Estimated: >60 mL/min (ref >60)
GFR, Estimated: 53 mL/min — ABNORMAL LOW (ref >60)
Glucose, Bld: 126 mg/dL — ABNORMAL HIGH (ref 70–99)
Glucose, Bld: 132 mg/dL — ABNORMAL HIGH (ref 70–99)
Glucose, Bld: 121 mg/dL — ABNORMAL HIGH (ref 70–99)
Potassium: 3.4 mmol/L — ABNORMAL LOW (ref 3.5–5.1)
Potassium: 3.9 mmol/L (ref 3.5–5.1)
Potassium: 3.8 mmol/L (ref 3.5–5.1)
Sodium: 133 mmol/L — ABNORMAL LOW (ref 135–145)
Sodium: 133 mmol/L — ABNORMAL LOW (ref 135–145)
Sodium: 132 mmol/L — ABNORMAL LOW (ref 135–145)

## 2024-07-28 LAB — RESPIRATORY PANEL BY PCR

## 2024-07-28 LAB — TROPONIN I (HIGH SENSITIVITY)
Troponin I (High Sensitivity): 53 ng/L — ABNORMAL HIGH (ref <18)
Troponin I (High Sensitivity): 42 ng/L — ABNORMAL HIGH (ref <18)
Troponin I (High Sensitivity): 40 ng/L — ABNORMAL HIGH (ref <18)

## 2024-07-28 LAB — STREP PNEUMONIAE URINARY ANTIGEN: Strep Pneumo Urinary Antigen: NEGATIVE

## 2024-07-28 LAB — PROCALCITONIN: Procalcitonin: 1.46 ng/mL

## 2024-07-28 LAB — MRSA NEXT GEN BY PCR, NASAL: MRSA by PCR Next Gen: NOT DETECTED

## 2024-07-28 MED ORDER — PREDNISONE 20 MG PO TABS
40.0000 mg | ORAL_TABLET | Freq: Every day | ORAL | Status: DC
  Administered 2024-07-29 – 2024-07-31 (×3): 40 mg via ORAL
  Filled 2024-07-28 (×3): qty 2

## 2024-07-28 MED ORDER — OXYCODONE-ACETAMINOPHEN 5-325 MG PO TABS: 1.0000 | ORAL_TABLET | ORAL | Status: DC | PRN

## 2024-07-28 MED ORDER — ORAL CARE MOUTH RINSE
15.0000 mL | OROMUCOSAL | Status: DC | PRN
Start: 2024-07-28 — End: 2024-08-01

## 2024-07-28 MED ORDER — IPRATROPIUM-ALBUTEROL 0.5-2.5 (3) MG/3ML IN SOLN
3.0000 mL | Freq: Three times a day (TID) | RESPIRATORY_TRACT | Status: DC
  Administered 2024-07-28: 3 mL via RESPIRATORY_TRACT
  Filled 2024-07-28: qty 3

## 2024-07-28 MED ORDER — IPRATROPIUM-ALBUTEROL 0.5-2.5 (3) MG/3ML IN SOLN
3.0000 mL | Freq: Three times a day (TID) | RESPIRATORY_TRACT | Status: DC
  Administered 2024-07-29 – 2024-08-01 (×10): 3 mL via RESPIRATORY_TRACT
  Filled 2024-07-28 (×10): qty 3

## 2024-07-28 MED ORDER — POTASSIUM CHLORIDE 20 MEQ PO PACK
40.0000 meq | PACK | Freq: Once | ORAL | Status: AC
  Administered 2024-07-28: 40 meq via ORAL
  Filled 2024-07-28: qty 2

## 2024-07-28 MED ORDER — METHYLPREDNISOLONE SODIUM SUCC 125 MG IJ SOLR
80.0000 mg | Freq: Every day | INTRAMUSCULAR | Status: DC
  Administered 2024-07-28: 80 mg via INTRAVENOUS
  Filled 2024-07-28 (×2): qty 2

## 2024-07-28 MED ORDER — MORPHINE SULFATE (PF) 2 MG/ML IV SOLN
2.0000 mg | Freq: Once | INTRAVENOUS | Status: AC
  Administered 2024-07-28: 2 mg via INTRAVENOUS
  Filled 2024-07-28: qty 1

## 2024-07-28 MED ORDER — SODIUM CHLORIDE 3 % IN NEBU
4.0000 mL | INHALATION_SOLUTION | Freq: Two times a day (BID) | RESPIRATORY_TRACT | Status: AC
  Administered 2024-07-29 – 2024-07-31 (×5): 4 mL via RESPIRATORY_TRACT
  Filled 2024-07-28 (×6): qty 4

## 2024-07-28 NOTE — Progress Notes (Signed)
 PHARMACY - PHYSICIAN COMMUNICATION CRITICAL VALUE ALERT - BLOOD CULTURE IDENTIFICATION (BCID)  Linda Brady is an 83 y.o. female who presented to [facility] on 07/27/2024 with a chief complaint of COPDE  Assessment: 1/4 bottles so far with GNR. BCID detected PsA. Suspect respiratory source.  Name of physician (or Provider) Contacted: Dr. Awanda  Current antibiotics: Cefepime   Changes to prescribed antibiotics recommended:  Patient is on recommended antibiotics - No changes needed  Results for orders placed or performed during the hospital encounter of 07/27/24  Blood Culture ID Panel (Reflexed) (Collected: 07/27/2024  4:35 PM)  Result Value Ref Range   Enterococcus faecalis NOT DETECTED NOT DETECTED   Enterococcus Faecium NOT DETECTED NOT DETECTED   Listeria monocytogenes NOT DETECTED NOT DETECTED   Staphylococcus species NOT DETECTED NOT DETECTED   Staphylococcus aureus (BCID) NOT DETECTED NOT DETECTED   Staphylococcus epidermidis NOT DETECTED NOT DETECTED   Staphylococcus lugdunensis NOT DETECTED NOT DETECTED   Streptococcus species NOT DETECTED NOT DETECTED   Streptococcus agalactiae NOT DETECTED NOT DETECTED   Streptococcus pneumoniae NOT DETECTED NOT DETECTED   Streptococcus pyogenes NOT DETECTED NOT DETECTED   A.calcoaceticus-baumannii NOT DETECTED NOT DETECTED   Bacteroides fragilis NOT DETECTED NOT DETECTED   Enterobacterales NOT DETECTED NOT DETECTED   Enterobacter cloacae complex NOT DETECTED NOT DETECTED   Escherichia coli NOT DETECTED NOT DETECTED   Klebsiella aerogenes NOT DETECTED NOT DETECTED   Klebsiella oxytoca NOT DETECTED NOT DETECTED   Klebsiella pneumoniae NOT DETECTED NOT DETECTED   Proteus species NOT DETECTED NOT DETECTED   Salmonella species NOT DETECTED NOT DETECTED   Serratia marcescens NOT DETECTED NOT DETECTED   Haemophilus influenzae NOT DETECTED NOT DETECTED   Neisseria meningitidis NOT DETECTED NOT DETECTED   Pseudomonas aeruginosa DETECTED  (A) NOT DETECTED   Stenotrophomonas maltophilia NOT DETECTED NOT DETECTED   Candida albicans NOT DETECTED NOT DETECTED   Candida auris NOT DETECTED NOT DETECTED   Candida glabrata NOT DETECTED NOT DETECTED   Candida krusei NOT DETECTED NOT DETECTED   Candida parapsilosis NOT DETECTED NOT DETECTED   Candida tropicalis NOT DETECTED NOT DETECTED   Cryptococcus neoformans/gattii NOT DETECTED NOT DETECTED   CTX-M ESBL NOT DETECTED NOT DETECTED   Carbapenem resistance IMP NOT DETECTED NOT DETECTED   Carbapenem resistance KPC NOT DETECTED NOT DETECTED   Carbapenem resistance NDM NOT DETECTED NOT DETECTED   Carbapenem resistance VIM NOT DETECTED NOT DETECTED    Marolyn KATHEE Mare 07/28/2024  5:42 PM

## 2024-07-28 NOTE — Progress Notes (Signed)
 Night covering provider notified of morning labs. morning lab showed WBC went up from 16 to 26.3 this morning and sodium correction went up by 5 mmol/L in 6 hrs went from 128 to 133. She is on NS at 75ml/hr. Also, Cr. trending up 1.24 from 1.03. VS are stable and patient is overall feeling better.   No further interventions at this time. Per provider to pass it along in the AM report.

## 2024-07-28 NOTE — Plan of Care (Signed)
 One time dose of IV morphine  given per order for left chest pain under the breast. Pain more intense with coughing and deep breathing.    Problem: Education: Goal: Knowledge of General Education information will improve Description: Including pain rating scale, medication(s)/side effects and non-pharmacologic comfort measures Outcome: Progressing   Problem: Health Behavior/Discharge Planning: Goal: Ability to manage health-related needs will improve Outcome: Progressing   Problem: Clinical Measurements: Goal: Ability to maintain clinical measurements within normal limits will improve Outcome: Progressing Goal: Diagnostic test results will improve Outcome: Progressing   Problem: Coping: Goal: Level of anxiety will decrease Outcome: Progressing   Problem: Pain Managment: Goal: General experience of comfort will improve and/or be controlled Outcome: Progressing   Problem: Safety: Goal: Ability to remain free from injury will improve Outcome: Progressing

## 2024-07-28 NOTE — Care Management Important Message (Signed)
 Important Message  Patient Details  Name: Linda Brady MRN: [id number] Date of Birth: 1941/04/29   Important Message Given:  Yes - Medicare IM     [name] 07/28/2024, 12:47 PM

## 2024-07-28 NOTE — Evaluation (Signed)
 Physical Therapy Evaluation Patient Details Name: Linda Brady MRN: [id number] DOB: [birth year] Today's Date: 07/28/2024  History of Present Illness  83 y/o female here with SOB and peripheral edema. Admitted for COPD exacerbation. 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  Pt did not need a lot of assist to get to sitting EOB, did fatigue with modest bout of ambulation (~45ft) on baseline 2L O2.  Pt very much wanting to go home (not rehab) will need increased ambulation tolerance as well as stair negotiation.  Pt will benefit from continued PT to address functional limitations.        If plan is discharge home, recommend the following:  (per continued improvement and success on steps)   Can travel by private vehicle        Equipment Recommendations None recommended by PT  Recommendations for Other Services       Functional Status Assessment Patient has had a recent decline in their functional status and demonstrates the ability to make significant improvements in function in a reasonable and predictable amount of time.     Precautions / Restrictions Precautions Precautions: Fall Restrictions Weight Bearing Restrictions Per Provider Order: No      Mobility  Bed Mobility Overal bed mobility: Needs Assistance Bed Mobility: Supine to Sit     Supine to sit: Contact guard          Transfers Overall transfer level: Needs assistance Equipment used: Rolling walker (2 wheels) Transfers: Sit to/from Stand Sit to Stand: Contact guard assist                Ambulation/Gait Ambulation/Gait assistance: Contact guard assist, Min assist Gait Distance (Feet): 40 Feet Assistive device: Rolling walker (2 wheels)          General Gait Details: Slow and hesitant gait, on baseline 2L O2.  Pt reports not needing walker recently, but did need it today. No LOBs, but fatigued quickly with modest effort.  Stairs            Wheelchair Mobility     Tilt Bed    Modified Rankin (Stroke Patients Only)       Balance Overall balance assessment: Needs assistance Sitting-balance support: Bilateral upper extremity supported Sitting balance-Leahy Scale: Good     Standing balance support: Bilateral upper extremity supported Standing balance-Leahy Scale: Fair Standing balance comment: no LOBs with UE use on walker                             Pertinent Vitals/Pain Pain Assessment Pain Assessment: No/denies pain    Home Living Family/patient expects to be discharged to:: Private residence Living Arrangements: Alone Available Help at Discharge: Family;Available PRN/intermittently (daughter does driving, meds) Type of Home: House Home Access: Stairs to enter Entrance Stairs-Rails: Right Entrance Stairs-Number of Steps: 4   Home Layout: One level Home Equipment: Shower seat - built [employer] fundraiser (2 wheels);Grab bars - tub/shower      Prior Function Prior Level of Function : Independent/Modified Independent             Mobility Comments: ambulatory without AD recently. Had used RW due to weakness but with assistance from HHPT, patient improved to no AD ADLs Comments: ind with ADLs. Daughter drives  her to/from appointments and does grocery shopping     Extremity/Trunk Assessment   Upper Extremity Assessment Upper Extremity Assessment: Overall WFL for tasks assessed    Lower Extremity Assessment Lower Extremity Assessment: Overall WFL for tasks assessed       Communication   Communication Communication: Impaired Factors Affecting Communication: Hearing impaired    Cognition Arousal: Alert Behavior During Therapy: WFL for tasks assessed/performed   PT -  Cognitive impairments: No apparent impairments                         Following commands: Intact       Cueing       General Comments General comments (skin integrity, edema, etc.): Pt was a little anxious t/o session but ultimately did well and showed confidence with small bout of ambulation, requesting to sit after ~76ft 2/2 fatigue.    Exercises     Assessment/Plan    PT Assessment Patient needs continued PT services  PT Problem List Decreased strength;Decreased range of motion;Decreased activity tolerance;Decreased balance;Pain;Decreased mobility;Decreased safety awareness       PT Treatment Interventions DME instruction;Gait training;Stair training;Functional mobility training;Therapeutic activities;Therapeutic exercise;Balance training;Patient/family education    PT Goals (Current goals can be found in the Care Plan section)  Acute Rehab PT Goals Patient Stated Goal: go home PT Goal Formulation: With patient/family Time For Goal Achievement: 08/10/24 Potential to Achieve Goals: Good    Frequency Min 2X/week     Co-evaluation               AM-PAC PT 6 Clicks Mobility  Outcome Measure Help needed turning from your back to your side while in a flat bed without using bedrails?: None Help needed moving from lying on your back to sitting on the side of a flat bed without using bedrails?: A Little Help needed moving to and from a bed to a chair (including a wheelchair)?: A Little Help needed standing up from a chair using your arms (e.g., wheelchair or bedside chair)?: A Little Help needed to walk in hospital room?: A Little Help needed climbing 3-5 steps with a railing? : A Lot 6 Click Score: 18    End of Session Equipment Utilized During Treatment: Gait belt Activity Tolerance: Patient tolerated treatment well;Patient limited by fatigue Patient left: with chair alarm set;with call bell/phone within reach Nurse Communication: Mobility status PT  Visit Diagnosis: Muscle weakness (generalized) (M62.81);Difficulty in walking, not elsewhere classified (R26.2)    Time: 9074-8997 PT Time Calculation (min) (ACUTE ONLY): 37 min   Charges:   PT Evaluation $PT Eval Low Complexity: 1 Low PT Treatments $Gait Training: 8-22 mins PT General Charges $$ ACUTE PT VISIT: 1 Visit         Carmin JONELLE Deed, DPT 07/28/2024, 1:03 PM

## 2024-07-28 NOTE — Progress Notes (Signed)
  PROGRESS NOTE    Linda Brady  FMW:[id number] DOB: 02-Apr-1941 DOA: 07/27/2024 PCP: Valora Agent, MD  [id number]  LOS: 1 day   Brief hospital course:   Assessment & Plan: Linda Brady is a 83 y.o. female with medical history significant of HTN, HLD, COPD on 2 L oxygen, dCHF, hypothyroidism, CKD stage IIIa, A-fib on Eliquis , obesity, who presents with cough and SOB.    Severe Sepsis 2/2 Pseudomonal bacteremia --pt meets criteria for sepsis with WBC 16.0, fever of 100.6, RR up to 24.  AKI. --likely pulmonary source --d/c IV vanc --cont cefepime   COPD exacerbation Chronic hypoxemic respiratory failure on 2L O2 --cont prednisone  40 mg daily --cont DuoNeb --add hypertonic saline BID --cont home 2L O2   Myocardial injury:  Troponin 40- > 47, likely demand ischemia.   Hyponatremia::  Sodium 127, improved --cont salt tabs   Essential hypertension: - IV hydralazine  as needed - Hold Lasix  and spironolactone  due to hyponatremia   Dyslipidemia -Lipitor   Chronic heart failure with preserved ejection fraction (HFpEF) (HCC): 2D echo on 01/16/2024 showed EF 60 to 65%.  Patient has trace leg edema, BNP normal 73, CHF seems to be compensated. - Hold Lasix  spironolactone  due to hyponatremia - Watch volume status closely   Afib-chronic:  --cont amiodarone  --cont Eliquis    Hypokalemia:  --monitor and supplement PRN  Obesity (BMI 30-39.9): Patient has Obesity Class I, with body weight 79.8 Kg and BMI 31.18 kg/m2.  - Encourage losing weight - Exercise and healthy diet   DVT prophylaxis: On:Eliquis  Code Status: Full code  Family Communication:  Level of care: Telemetry Medical Dispo:   The patient is from: home Anticipated d/c is to: home Anticipated d/c date is: 2-3 days   Subjective and Interval History:  Pt continued to have cough with sputum production.  Dyspnea improved.   Objective: Vitals:   07/28/24 0739 07/28/24 0820 07/28/24 1703 07/28/24 2041   BP: (!) 139/58  (!) 125/57 (!) 139/51  Pulse: 74  74 77  Resp: (!) 21  18 16   Temp: 98 F (36.7 C)  98 F (36.7 C) 98.6 F (37 C)  TempSrc: Oral  Oral Oral  SpO2: 99% 97% 99% 100%  Weight:      Height:        Intake/Output Summary (Last 24 hours) at 07/28/2024 2134 Last data filed at 07/28/2024 2047 Gross per 24 hour  Intake 1194.94 ml  Output 3350 ml  Net -2155.06 ml   Filed Weights   07/27/24 1631 07/28/24 0500  Weight: 79.8 kg 79.3 kg    Examination:   Constitutional: NAD, AAOx3 HEENT: conjunctivae and lids normal, EOMI CV: No cyanosis.   RESP: mildly increased respiratory effort, on 2L Neuro: II - XII grossly intact.   Psych: Normal mood and affect.  Appropriate judgement and reason   Data Reviewed: I have personally reviewed labs and imaging studies  Time spent: 50 minutes  Ellouise Haber, MD [facility] If 7PM-7AM, please contact night-coverage 07/28/2024, 9:34 PM

## 2024-07-28 NOTE — Consult Note (Incomplete)
 Pharmacy Antibiotic Note  Linda Brady is a 83 y.o. female admitted on 07/27/2024 with chest pain and shortness of breath. Pharmacy has been consulted for Vancomycin  dosing. Patient is also receiving IV Cefepime .  Plan: Vancomycin  1750mg  IV x 1 as loading dose(given in ED), followed by: Vancomycin  1750 mg IV Q 48 hrs. Goal AUC 400-550. Expected AUC: 473.6 Expected Cmin: 8.8 SCr used: 1.11(b/l~1), Vd used: 0.72   Height: 5' 3 (160 cm) Weight: 79.3 kg (174 lb 13.2 oz) IBW/kg (Calculated) : 52.4  Temp (24hrs), Avg:98.7 F (37.1 C), Min:97.6 F (36.4 C), Max:100.6 F (38.1 C)  Recent Labs  Lab 07/27/24 1635 07/27/24 2043 07/28/24 0404  WBC 16.0*  --  26.3*  CREATININE 1.03* 1.11* 1.24*  LATICACIDVEN 1.1  --   --     Estimated Creatinine Clearance: 36.1 mL/min (A) (by C-G formula based on SCr of 1.24 mg/dL (H)).    Allergies  Allergen Reactions   Naproxen Sodium Anaphylaxis    Antimicrobials this admission: Vancomycin  8/26 >>  Cefepime  8/27  >>  Zosyn  8/26 x 1  Dose adjustments this admission: N/A  Microbiology results: 8/26 BCx: collected 8/26 Sputum: negative  8/26 MRSA PCR: ordered  Thank you for allowing pharmacy to be a part of this patient's care.  Elsie CHRISTELLA Piety 07/28/2024 8:46 AM

## 2024-07-29 DIAGNOSIS — J441 Chronic obstructive pulmonary disease with (acute) exacerbation: Secondary | ICD-10-CM | POA: Diagnosis not present

## 2024-07-29 LAB — LEGIONELLA PNEUMOPHILA SEROGP 1 UR AG: L. pneumophila Serogp 1 Ur Ag: NEGATIVE

## 2024-07-29 MED ORDER — IPRATROPIUM-ALBUTEROL 0.5-2.5 (3) MG/3ML IN SOLN
3.0000 mL | Freq: Once | RESPIRATORY_TRACT | Status: AC
  Administered 2024-07-29: 3 mL via RESPIRATORY_TRACT
  Filled 2024-07-29: qty 3

## 2024-07-29 NOTE — Progress Notes (Signed)
  PROGRESS NOTE    Linda Brady  FMW:[id number] DOB: 1941-06-25 DOA: 07/27/2024 PCP: Valora Agent, MD  212A/[id number]-AA  LOS: 2 days   Brief hospital course:   Assessment & Plan: Linda Brady is a 83 y.o. female with medical history significant of HTN, HLD, COPD on 2 L oxygen, dCHF, hypothyroidism, CKD stage IIIa, A-fib on Eliquis , obesity, who presents with cough and SOB.    Severe Sepsis 2/2 PNA Pseudomonal bacteremia --pt meets criteria for sepsis with WBC 16.0, fever of 100.6, RR up to 24.  AKI. --likely pulmonary source --d/c'ed IV vanc --cont cefepime  --repeat blood cx tomorrow  COPD exacerbation Chronic hypoxemic respiratory failure on 2L O2 --cont prednisone  40 mg daily --cont DuoNeb and hypertonic saline BID --cont home 2L O2   Myocardial injury:  Troponin 40- > 47, likely demand ischemia.   Hyponatremia::  Sodium 127, improved --cont salt tabs   Essential hypertension: - IV hydralazine  as needed - Hold Lasix  and spironolactone  due to hyponatremia   Dyslipidemia -Lipitor   Chronic heart failure with preserved ejection fraction (HFpEF) (HCC): 2D echo on 01/16/2024 showed EF 60 to 65%.  Patient has trace leg edema, BNP normal 73, CHF seems to be compensated. - Hold Lasix  spironolactone  due to hyponatremia - Watch volume status closely   Afib-chronic:  --cont amiodarone  --cont Eliquis    Hypokalemia:  --monitor and supplement PRN  Obesity (BMI 30-39.9): Patient has Obesity Class I, with body weight 79.8 Kg and BMI 31.18 kg/m2.  - Encourage losing weight - Exercise and healthy diet   DVT prophylaxis: On:Eliquis  Code Status: Full code  Family Communication:  Level of care: Telemetry Medical Dispo:   The patient is from: home Anticipated d/c is to: home Anticipated d/c date is: 2-3 days   Subjective and Interval History:  Pt reported not feeling well, had a lot of coughing and vomited.     Objective: Vitals:   07/29/24 0645 07/29/24  0700 07/29/24 0753 07/29/24 1542  BP:   (!) 137/50 (!) 123/53  Pulse:   68 76  Resp:   18 17  Temp:   97.6 F (36.4 C) 98.1 F (36.7 C)  TempSrc:   Oral   SpO2: 96% 97% 100% 93%  Weight:      Height:        Intake/Output Summary (Last 24 hours) at 07/29/2024 1735 Last data filed at 07/29/2024 1654 Gross per 24 hour  Intake 520 ml  Output 2000 ml  Net -1480 ml   Filed Weights   07/27/24 1631 07/28/24 0500 07/29/24 0500  Weight: 79.8 kg 79.3 kg 81.1 kg    Examination:   Constitutional: NAD, AAOx3 HEENT: conjunctivae and lids normal, EOMI CV: No cyanosis.   RESP: normal respiratory effort, on 2L Neuro: II - XII grossly intact.   Psych: Normal mood and affect.  Appropriate judgement and reason   Data Reviewed: I have personally reviewed labs and imaging studies  Time spent: 35 minutes  Ellouise Haber, MD [facility] If 7PM-7AM, please contact night-coverage 07/29/2024, 5:35 PM

## 2024-07-29 NOTE — Progress Notes (Signed)
 Mobility Specialist - Progress Note   07/29/24 1422  Mobility  Activity Ambulated with assistance;Pivoted/transferred to/from [facility]  Level of Assistance Standby assist, set-up cues, supervision of patient - no hands on  Assistive Device Front wheel walker  Distance Ambulated (ft) 6 ft  Activity Response Tolerated well  Mobility visit 1 Mobility  Mobility Specialist Start Time (ACUTE ONLY) 1411  Mobility Specialist Stop Time (ACUTE ONLY) 1420  Mobility Specialist Time Calculation (min) (ACUTE ONLY) 9 min   Pt sitting on the BSC upon entry. Pt completes peri care indep, STS to RW and amb to the recliner SBA-- completes transfer on RA, placed on [place] upon return to the recliner. Pt left seated with alarm set and needs within reach.  America Silvan Mobility Specialist 07/29/24 2:30 PM

## 2024-07-29 NOTE — TOC Progression Note (Signed)
 Transition of Care [facility]) - Progression Note    Patient Details  Name: LARENA OHNEMUS MRN: [id number] Date of Birth: Aug 17, 1941  [facility] CM/SW Contact  Corean ONEIDA Haddock, RN Phone Number: 07/29/2024, 3:01 PM  Clinical Narrative:      Therapy is recommending home health.  Discussed recommendations with patient.  She states that she doesn't want to make a decision right now, and request CM follow up closer to discharge                    Expected Discharge Plan and Services                                               Social Drivers of Health (SDOH) Interventions SDOH Screenings   Food Insecurity: No Food Insecurity (07/27/2024)  Housing: Low Risk  (07/27/2024)  Transportation Needs: No Transportation Needs (07/27/2024)  Utilities: Not At Risk (07/27/2024)  Financial Resource Strain: Low Risk  (06/16/2024)   Received from [facility]  Social Connections: Patient Declined (07/27/2024)  Recent Concern: Social Connections - Socially Isolated (05/18/2024)  Tobacco Use: Medium Risk (07/27/2024)    Readmission Risk Interventions    05/20/2024    4:06 PM 10/28/2023    2:54 PM  Readmission Risk Prevention Plan  Transportation Screening Complete Complete  PCP or Specialist Appt within 5-7 Days  Complete  PCP or Specialist Appt within 3-5 Days Complete   Home Care Screening  Complete  Medication Review (RN CM)  Complete  HRI or Home Care Consult Complete   Social Work Consult for Recovery Care Planning/Counseling Complete   Palliative Care Screening Not Applicable   Medication Review Oceanographer) Complete

## 2024-07-30 DIAGNOSIS — J441 Chronic obstructive pulmonary disease with (acute) exacerbation: Secondary | ICD-10-CM | POA: Diagnosis not present

## 2024-07-30 LAB — CBC
HCT: 33 % — ABNORMAL LOW (ref 36.0–46.0)
Hemoglobin: 9.9 g/dL — ABNORMAL LOW (ref 12.0–15.0)
MCH: 25.5 pg — ABNORMAL LOW (ref 26.0–34.0)
MCHC: 30 g/dL (ref 30.0–36.0)
MCV: 85.1 fL (ref 80.0–100.0)
Platelets: 405 K/uL — ABNORMAL HIGH (ref 150–400)
RBC: 3.88 MIL/uL (ref 3.87–5.11)
RDW: 18.6 % — ABNORMAL HIGH (ref 11.5–15.5)
WBC: 13.2 K/uL — ABNORMAL HIGH (ref 4.0–10.5)
nRBC: 0 % (ref 0.0–0.2)

## 2024-07-30 LAB — CULTURE, RESPIRATORY W GRAM STAIN

## 2024-07-30 LAB — BASIC METABOLIC PANEL WITH GFR
Anion gap: 11 (ref 5–15)
BUN: 13 mg/dL (ref 8–23)
CO2: 23 mmol/L (ref 22–32)
Calcium: 9.6 mg/dL (ref 8.9–10.3)
Chloride: 99 mmol/L (ref 98–111)
Creatinine, Ser: 0.8 mg/dL (ref 0.44–1.00)
GFR, Estimated: >60 mL/min (ref >60)
Glucose, Bld: 110 mg/dL — ABNORMAL HIGH (ref 70–99)
Potassium: 3.7 mmol/L (ref 3.5–5.1)
Sodium: 133 mmol/L — ABNORMAL LOW (ref 135–145)

## 2024-07-30 LAB — CULTURE, BLOOD (ROUTINE X 2): Special Requests: ADEQUATE

## 2024-07-30 LAB — MAGNESIUM: Magnesium: 2.2 mg/dL (ref 1.7–2.4)

## 2024-07-30 MED ORDER — SPIRONOLACTONE 25 MG PO TABS
25.0000 mg | ORAL_TABLET | Freq: Every day | ORAL | Status: DC
  Administered 2024-07-30 – 2024-08-01 (×3): 25 mg via ORAL
  Filled 2024-07-30 (×3): qty 1

## 2024-07-30 MED ORDER — FLUTICASONE FUROATE-VILANTEROL 100-25 MCG/ACT IN AEPB
1.0000 | INHALATION_SPRAY | Freq: Every day | RESPIRATORY_TRACT | Status: DC
  Administered 2024-07-30 – 2024-08-01 (×3): 1 via RESPIRATORY_TRACT
  Filled 2024-07-30: qty 28

## 2024-07-30 NOTE — Progress Notes (Signed)
 Physical Therapy Treatment Patient Details Name: Linda Brady MRN: [id number] DOB: [birth year] Today's Date: 07/30/2024   History of Present Illness 83 y/o female here with SOB and peripheral edema. Admitted for COPD exacerbation. 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 Pt states she is feeling better, c/c of Left sacral discomfort (sacroplasty in 7/25) with mobility. Pt able to demonstrate sit<>stand from chair with Supervision. Gait training with RW in hall with CG/Supervision, no LOB or SOB. Pt functionally progressing well. Will continue to see acutely    If plan is discharge home, recommend the following: A little help with walking and/or transfers;A little help with bathing/dressing/bathroom;Assist for transportation;Help with stairs or ramp for entrance   Can travel by private vehicle        Equipment Recommendations  None recommended by PT    Recommendations for Other Services       Precautions / Restrictions Precautions Precautions: Fall Recall of Precautions/Restrictions: Intact Restrictions Weight Bearing Restrictions Per Provider Order: No     Mobility  Bed Mobility               General bed mobility comments:  (NT in chair pre/post session)    Transfers Overall transfer level: Needs assistance Equipment used: Rolling walker (2 wheels) Transfers: Sit to/from Stand Sit to Stand: Supervision           General transfer comment:  (Good technique, no struggle with sit<>stand)    Ambulation/Gait Ambulation/Gait assistance: Supervision, Contact guard assist Gait Distance (Feet): 85 Feet Assistive device: Rolling walker (2 wheels) Gait Pattern/deviations: Step-through pattern Gait velocity: decr      General Gait Details: No LOB or SOB while ambulating on 2L O2 (baseline)   Stairs Stairs:  (No Stairs to enter home)           Wheelchair Mobility     Tilt Bed    Modified Rankin (Stroke Patients Only)       Balance Overall balance assessment: Needs assistance Sitting-balance support: Bilateral upper extremity supported Sitting balance-Leahy Scale: Good     Standing balance support: Bilateral upper extremity supported Standing balance-Leahy Scale: Fair Standing balance comment: no LOBs with UE use on walker                            Communication Communication Communication: Impaired Factors Affecting Communication: Hearing impaired  Cognition Arousal: Alert Behavior During Therapy: WFL for tasks assessed/performed   PT - Cognitive impairments: No apparent impairments                         Following commands: Intact      Cueing Cueing Techniques: Verbal cues  Exercises General Exercises - Lower Extremity Ankle Circles/Pumps: AROM, Both, 10 reps Long Arc Quad: AROM, Both, 10 reps, Seated Hip Flexion/Marching: AROM, Both, 10 reps, Seated    General Comments General comments (skin integrity, edema, etc.):  (Right hearing aid, 2L O2 at baseline, unable to read SpO2 due to cold fingers.)      Pertinent Vitals/Pain Pain Assessment Pain Assessment: 0-10 Pain Score: 4  Pain Location: Left hip, sacral area Pain Descriptors / Indicators: Aching, Discomfort Pain Intervention(s): Limited activity within patient's tolerance  Home Living                          Prior Function            PT Goals (current goals can now be found in the care plan section) Acute Rehab PT Goals Patient Stated Goal: go home Progress towards PT goals: Progressing toward goals    Frequency    Min 2X/week      PT Plan      Co-evaluation              AM-PAC PT 6 Clicks Mobility   Outcome Measure  Help needed turning from  your back to your side while in a flat bed without using bedrails?: None Help needed moving from lying on your back to sitting on the side of a flat bed without using bedrails?: A Little Help needed moving to and from a bed to a chair (including a wheelchair)?: A Little Help needed standing up from a chair using your arms (e.g., wheelchair or bedside chair)?: A Little Help needed to walk in hospital room?: A Little Help needed climbing 3-5 steps with a railing? : A Little 6 Click Score: 19    End of Session Equipment Utilized During Treatment: Gait belt;Oxygen Activity Tolerance: Patient tolerated treatment well;Patient limited by fatigue Patient left: in chair;with call bell/phone within reach;with chair alarm set Nurse Communication: Mobility status PT Visit Diagnosis: Muscle weakness (generalized) (M62.81);Difficulty in walking, not elsewhere classified (R26.2)     Time: 8945-8864 PT Time Calculation (min) (ACUTE ONLY): 41 min  Charges:    $Gait Training: 8-22 mins $Therapeutic Exercise: 8-22 mins $Therapeutic Activity: 8-22 mins PT General Charges $$ ACUTE PT VISIT: 1 Visit                    Darice Bohr, PTA  Darice JAYSON Bohr 07/30/2024, 11:53 AM

## 2024-07-30 NOTE — Progress Notes (Signed)
  PROGRESS NOTE    Linda Brady  FMW:[id number] DOB: Aug 12, 1941 DOA: 07/27/2024 PCP: Valora Agent, MD  212A/[id number]-AA  LOS: 3 days   Brief hospital course:   Assessment & Plan: ANGELLI Brady is a 83 y.o. female with medical history significant of HTN, HLD, COPD on 2 L oxygen, dCHF, hypothyroidism, CKD stage IIIa, A-fib on Eliquis , obesity, who presents with cough and SOB.    Severe Sepsis 2/2 PNA Pseudomonal bacteremia --pt meets criteria for sepsis with WBC 16.0, fever of 100.6, RR up to 24.  AKI. --likely pulmonary source --d/c'ed IV vanc --cont cefepime  --can discharge on Cipro 750mg  BID for a 7-10 day course.  COPD exacerbation Chronic hypoxemic respiratory failure on 2L O2 --cont prednisone  40 mg daily --cont DuoNeb and hypertonic saline BID --cont home 2L O2 --resume home bronchodilators   Myocardial injury:  Troponin 40- > 47, likely demand ischemia.   Hyponatremia::  Sodium 127, improved --cont salt tabs   Essential hypertension: - IV hydralazine  as needed --resume aldactone    Dyslipidemia -Lipitor   Chronic heart failure with preserved ejection fraction (HFpEF) (HCC): 2D echo on 01/16/2024 showed EF 60 to 65%.  Patient has trace leg edema, BNP normal 73, CHF seems to be compensated. --resume aldactone  - Watch volume status closely   Afib-chronic:  --cont amiodarone  --cont Eliquis    Hypokalemia:  --monitor and supplement PRN  Obesity (BMI 30-39.9): Patient has Obesity Class I, with body weight 79.8 Kg and BMI 31.18 kg/m2.  - Encourage losing weight - Exercise and healthy diet   DVT prophylaxis: On:Eliquis  Code Status: Full code  Family Communication:  Level of care: Telemetry Medical Dispo:   The patient is from: home Anticipated d/c is to: home Anticipated d/c date is: 1-2 days   Subjective and Interval History:  Pt reported feeling better today.   Objective: Vitals:   07/30/24 0821 07/30/24 0856 07/30/24 1453 07/30/24 1501   BP: (!) 148/75   (!) 143/57  Pulse: 78   72  Resp:    20  Temp: 97.8 F (36.6 C)   98.8 F (37.1 C)  TempSrc: Oral     SpO2: 91% 93% 95% 100%  Weight:      Height:        Intake/Output Summary (Last 24 hours) at 07/30/2024 1820 Last data filed at 07/30/2024 1404 Gross per 24 hour  Intake 720 ml  Output 2050 ml  Net -1330 ml   Filed Weights   07/27/24 1631 07/28/24 0500 07/29/24 0500  Weight: 79.8 kg 79.3 kg 81.1 kg    Examination:   Constitutional: NAD, AAOx3 HEENT: conjunctivae and lids normal, EOMI CV: No cyanosis.   RESP: normal respiratory effort, on 2L Neuro: II - XII grossly intact.   Psych: Normal mood and affect.  Appropriate judgement and reason   Data Reviewed: I have personally reviewed labs and imaging studies  Time spent: 35 minutes  Ellouise Haber, MD [facility] If 7PM-7AM, please contact night-coverage 07/30/2024, 6:20 PM

## 2024-07-30 NOTE — Plan of Care (Signed)

## 2024-07-31 DIAGNOSIS — J441 Chronic obstructive pulmonary disease with (acute) exacerbation: Secondary | ICD-10-CM | POA: Diagnosis not present

## 2024-07-31 LAB — BASIC METABOLIC PANEL WITH GFR
Anion gap: 8 (ref 5–15)
BUN: 13 mg/dL (ref 8–23)
CO2: 25 mmol/L (ref 22–32)
Calcium: 9.4 mg/dL (ref 8.9–10.3)
Chloride: 102 mmol/L (ref 98–111)
Creatinine, Ser: 0.8 mg/dL (ref 0.44–1.00)
GFR, Estimated: >60 mL/min (ref >60)
Glucose, Bld: 99 mg/dL (ref 70–99)
Potassium: 3.4 mmol/L — ABNORMAL LOW (ref 3.5–5.1)
Sodium: 135 mmol/L (ref 135–145)

## 2024-07-31 LAB — MAGNESIUM: Magnesium: 2.2 mg/dL (ref 1.7–2.4)

## 2024-07-31 LAB — CBC
HCT: 29.1 % — ABNORMAL LOW (ref 36.0–46.0)
Hemoglobin: 8.9 g/dL — ABNORMAL LOW (ref 12.0–15.0)
MCH: 24.9 pg — ABNORMAL LOW (ref 26.0–34.0)
MCHC: 30.6 g/dL (ref 30.0–36.0)
MCV: 81.5 fL (ref 80.0–100.0)
Platelets: 415 K/uL — ABNORMAL HIGH (ref 150–400)
RBC: 3.57 MIL/uL — ABNORMAL LOW (ref 3.87–5.11)
RDW: 18.4 % — ABNORMAL HIGH (ref 11.5–15.5)
WBC: 9.3 K/uL (ref 4.0–10.5)
nRBC: 0 % (ref 0.0–0.2)

## 2024-07-31 MED ORDER — SODIUM CHLORIDE 3 % IN NEBU
4.0000 mL | INHALATION_SOLUTION | Freq: Two times a day (BID) | RESPIRATORY_TRACT | Status: DC
  Administered 2024-07-31 – 2024-08-01 (×2): 4 mL via RESPIRATORY_TRACT
  Filled 2024-07-31 (×2): qty 4

## 2024-07-31 MED ORDER — POTASSIUM CHLORIDE CRYS ER 20 MEQ PO TBCR
40.0000 meq | EXTENDED_RELEASE_TABLET | Freq: Once | ORAL | Status: AC
  Administered 2024-07-31: 40 meq via ORAL
  Filled 2024-07-31: qty 2

## 2024-07-31 NOTE — Plan of Care (Signed)

## 2024-07-31 NOTE — Progress Notes (Signed)
  PROGRESS NOTE    Linda Brady  FMW:[id number] DOB: Oct 05, 1941 DOA: 07/27/2024 PCP: Valora Agent, MD  212A/[id number]-AA  LOS: 4 days   Brief hospital course:   Assessment & Plan: Linda Brady is a 83 y.o. female with medical history significant of HTN, HLD, COPD on 2 L oxygen, dCHF, hypothyroidism, CKD stage IIIa, A-fib on Eliquis , obesity, who presents with cough and SOB.    Severe Sepsis 2/2 PNA Pseudomonal bacteremia --pt meets criteria for sepsis with WBC 16.0, fever of 100.6, RR up to 24.  AKI. --likely pulmonary source --d/c'ed IV vanc --cont cefepime  for 5 days --can discharge on Cipro  750mg  BID for a 7-10 day course.  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   Hyponatremia::  Sodium 127, improved --cont salt tabs   Essential hypertension: - IV hydralazine  as needed --cont home aldactone    Dyslipidemia -Lipitor   Chronic heart failure with preserved ejection fraction (HFpEF) (HCC): 2D echo on 01/16/2024 showed EF 60 to 65%.  Patient has trace leg edema, BNP normal 73, CHF seems to be compensated. --cont home aldactone  - Watch volume status closely   Afib-chronic:  --cont amiodarone  --cont Eliquis    Hypokalemia:  --monitor and supplement PRN  Obesity (BMI 30-39.9): Patient has Obesity Class I, with body weight 79.8 Kg and BMI 31.18 kg/m2.  - Encourage losing weight - Exercise and healthy diet   DVT prophylaxis: On:Eliquis  Code Status: Full code  Family Communication:  Level of care: Telemetry Medical Dispo:   The patient is from: home Anticipated d/c is to: home Anticipated d/c date is: tomorrow   Subjective and Interval History:  Pt reported feeling worse today due to coughing fits.   Objective: Vitals:   07/30/24 2014 07/31/24  0345 07/31/24 0500 07/31/24 1545  BP: (!) 149/59 (!) 153/59  (!) 129/55  Pulse: 72 68  80  Resp: 16 16  20   Temp: 97.9 F (36.6 C) 97.9 F (36.6 C)  98 F (36.7 C)  TempSrc: Axillary Oral  Oral  SpO2: 100% 100%  (!) 88%  Weight:   80.7 kg   Height:        Intake/Output Summary (Last 24 hours) at 07/31/2024 1735 Last data filed at 07/31/2024 1300 Gross per 24 hour  Intake 360 ml  Output 400 ml  Net -40 ml   Filed Weights   07/28/24 0500 07/29/24 0500 07/31/24 0500  Weight: 79.3 kg 81.1 kg 80.7 kg    Examination:   Constitutional: NAD, AAOx3 HEENT: conjunctivae and lids normal, EOMI CV: No cyanosis.   RESP: normal respiratory effort, on 2L Neuro: II - XII grossly intact.   Psych: Normal mood and affect.  Appropriate judgement and reason   Data Reviewed: I have personally reviewed labs and imaging studies  Time spent: 35 minutes  Ellouise Haber, MD [facility] If 7PM-7AM, please contact night-coverage 07/31/2024, 5:35 PM

## 2024-07-31 NOTE — Progress Notes (Signed)
 Mobility Specialist - Progress Note   07/31/24 1035  Mobility  Activity Ambulated with assistance  Level of Assistance Contact guard assist, steadying assist  Assistive Device Front wheel walker  Distance Ambulated (ft) 60 ft  Activity Response Tolerated well  Mobility visit 1 Mobility  Mobility Specialist Start Time (ACUTE ONLY) [id number]  Mobility Specialist Stop Time (ACUTE ONLY) 0932  Mobility Specialist Time Calculation (min) (ACUTE ONLY) 14 min   Pt sitting in the recliner upon entry, utilizing 2L [place]. Pt agreeable to amb this date. Pt STS to RW MinG and amb a self-selected ~60 ft within the hallway, endorsing a little lightheaded feeling ~ 30 ft into amb prompting a return to the room--- requiring one short standing rest break. Pt O2 destat to 86% upon return to the room, rising to 93% after one min PBL with seated rest. Pt left seated in the recliner with alarm set and needs within reach.   America Silvan Mobility Specialist 07/31/24 10:41 AM

## 2024-08-01 LAB — POTASSIUM: Potassium: 3.4 mmol/L — ABNORMAL LOW (ref 3.5–5.1)

## 2024-08-01 LAB — CULTURE, BLOOD (ROUTINE X 2): Culture: NO GROWTH

## 2024-08-01 MED ORDER — CIPROFLOXACIN HCL 750 MG PO TABS: 750.0000 mg | ORAL_TABLET | Freq: Two times a day (BID) | ORAL | 0 refills | Status: AC

## 2024-08-01 MED ORDER — POTASSIUM CHLORIDE CRYS ER 20 MEQ PO TBCR
40.0000 meq | EXTENDED_RELEASE_TABLET | Freq: Once | ORAL | Status: AC
  Administered 2024-08-01: 40 meq via ORAL
  Filled 2024-08-01: qty 2

## 2024-08-01 MED ORDER — ISOSORBIDE MONONITRATE ER 30 MG PO TB24
30.0000 mg | ORAL_TABLET | Freq: Every day | ORAL | Status: DC
  Administered 2024-08-01: 30 mg via ORAL
  Filled 2024-08-01: qty 1

## 2024-08-01 NOTE — Discharge Summary (Signed)
 Physician Discharge Summary   Linda Brady  female DOB: 1941/11/05  FMW:[id number]  PCP: Valora Agent, MD  Admit date: 07/27/2024 Discharge date: 08/01/2024  Admitted From: home Disposition:  home CODE STATUS: Full code  Discharge Instructions     Discharge instructions   Complete by: As directed    You have pseudomonas pneumonia and blood infection.  You have received 5 days of IV antibiotic, and can finish 5 more days of oral antibiotics Cipro  at home.    Please consider working with your outpatient provider to taper off your chronic prednisone , as that can lower your immune system. [facility] [place] [facility]:  For full details, please see H&P, progress notes, consult notes and ancillary notes.  Briefly,  Linda Brady is a 83 y.o. female with medical history significant of HTN, COPD on 2 L oxygen, dCHF, hypothyroidism, CKD stage IIIa, A-fib on Eliquis , obesity, who presented with cough and SOB.    Severe Sepsis 2/2 PNA Pseudomonal bacteremia --pt meets criteria for sepsis with WBC 16.0, fever of 100.6, RR up to 24.  AKI. --likely pulmonary source --d/c'ed IV vanc --received cefepime  for 5 days, and discharged on Cipro  750mg  BID for 5 more days.   COPD exacerbation Chronic hypoxemic respiratory failure on 2L O2 --received IV solumedrol 80 mg f/b prednisone  40 mg daily x 3 days. --received DuoNeb and hypertonic saline BID --cont home bronchodilators --on home chronic prednisone  5 mg daily.  Advise f/u with outpatient prescriber to taper off.   Myocardial injury:  Troponin 40- > 47, likely demand ischemia.   Hyponatremia::  Sodium 127, improved, 135 prior to discharge. --pt was ordered salt tabs on admission, which was not continued after discharge.   Essential hypertension: --cont home aldactone    Dyslipidemia -Lipitor   Chronic heart failure with preserved ejection fraction (HFpEF) (HCC): 2D echo on 01/16/2024 showed EF 60 to 65%.  Patient has  trace leg edema, BNP normal 73, CHF seems to be compensated. --cont home aldactone    Afib-chronic:  --cont amiodarone  --cont Eliquis    Hypokalemia:  --monitored and supplemented PRN   Obesity (BMI 30-39.9): Patient has Obesity Class I, with body weight 79.8 Kg and BMI 31.18 kg/m2.  - Encourage losing weight - Exercise and healthy diet   Discharge Diagnoses:  Principal Problem:   COPD exacerbation (HCC) Active Problems:   Sepsis (HCC)   Myocardial injury   Hyponatremia   Essential hypertension   Dyslipidemia   Chronic heart failure with preserved ejection fraction (HFpEF) (HCC)   Afib (HCC)   Hypokalemia   Hypothyroidism   Obesity (BMI 30-39.9)   30 Day Unplanned Readmission Risk Score    Flowsheet Row ED to Hosp-Admission (Current) from 07/27/2024 in [facility]  30 Day Unplanned Readmission Risk Score (%) 24.21 Filed at 08/01/2024 0801    This score is the patient's risk of an unplanned readmission within 30 days of being discharged (0 -100%). The score is based on dignosis, age, lab data, medications, orders, and past utilization.   Low:  0-14.9   Medium: 15-21.9   High: 22-29.9   Extreme: 30 and above         Discharge Instructions:  Allergies as of 08/01/2024       Reactions   Naproxen Sodium Anaphylaxis        Medication List     TAKE these medications    ALPRAZolam  0.25 MG tablet Commonly known as: XANAX  Take 0.25  mg by mouth at bedtime as needed for sleep.   amiodarone  200 MG tablet Commonly known as: PACERONE  Take 1 tablet (200 mg total) by mouth daily.   atorvastatin  40 MG tablet Commonly known as: LIPITOR Take 1 tablet (40 mg total) by mouth daily.   cholecalciferol  25 MCG (1000 UNIT) tablet Commonly known as: VITAMIN D3 Take 1,000 Units by mouth daily.   ciprofloxacin  750 MG tablet Commonly known as: CIPRO  Take 1 tablet (750 mg total) by mouth 2 (two) times daily for 5 days.   Combivent   Respimat 20-100 MCG/ACT Aers respimat Generic drug: Ipratropium-Albuterol  Inhale 1 puff into the lungs every 6 (six) hours as needed for wheezing.   cyanocobalamin  1000 MCG tablet Commonly known as: VITAMIN B12 Take 1,000 mcg by mouth daily.   dapagliflozin  propanediol 10 MG Tabs tablet Commonly known as: Farxiga  Take 1 tablet (10 mg total) by mouth daily before breakfast.   Eliquis  5 MG Tabs tablet Generic drug: apixaban  Take 5 mg by mouth 2 (two) times daily.   gabapentin  100 MG capsule Commonly known as: NEURONTIN  Take 100-200 mg by mouth 2 (two) times daily.   isosorbide  mononitrate 30 MG 24 hr tablet Commonly known as: IMDUR  Take 1 tablet (30 mg total) by mouth daily.   levalbuterol  1.25 MG/3ML nebulizer solution Commonly known as: XOPENEX  Take 1.25 mg by nebulization every 6 (six) hours as needed.   lidocaine  5 % Commonly known as: LIDODERM  Place 1 patch onto the skin daily.   mometasone -formoterol  200-5 MCG/ACT Aero Commonly known as: DULERA  Inhale 2 puffs into the lungs 2 (two) times daily.   montelukast  10 MG tablet Commonly known as: SINGULAIR  Take 1 tablet by mouth at bedtime.   potassium chloride  SA 20 MEQ tablet Commonly known as: KLOR-CON  M Take 2 tablets (40 mEq total) by mouth daily.   predniSONE  5 MG tablet Commonly known as: DELTASONE  Take 5 mg by mouth daily with breakfast.   rOPINIRole  0.25 MG tablet Commonly known as: REQUIP  Take 1 tablet (0.25 mg total) by mouth at bedtime.   spironolactone  25 MG tablet Commonly known as: ALDACTONE  Take 1 tablet (25 mg total) by mouth daily.   sucralfate  1 g tablet Commonly known as: CARAFATE  Take 1 g by mouth 4 (four) times daily -  with meals and at bedtime.         Follow-up Information     Valora Agent, MD Follow up in 1 week(s).   Specialty: Family Medicine Contact information: [address] [place] [address] [phone]                 Allergies   Allergen Reactions   Naproxen Sodium Anaphylaxis     The results of significant diagnostics from this hospitalization (including imaging, microbiology, ancillary and laboratory) are listed below for reference.   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 Similar vascular and basilar interstitial prominence/scarring. No superimposed acute pneumonia, acute CHF, large effusion or pneumothorax. Trachea midline. Aorta atherosclerotic. Bones are osteopenic. Degenerative changes throughout the spine. Right axillary vascular stent noted. IMPRESSION: 1. Cardiomegaly with vascular and basilar interstitial prominence/scarring. 2. No acute chest process by plain radiography.  Stable exam. Electronically Signed   By: CHRISTELLA.  Shick M.D.   On: 07/27/2024 17:57  MR LUMBAR SPINE WO CONTRAST Result Date: 07/19/2024 EXAM: MRI LUMBAR SPINE 07/10/2024 04:40:35 PM TECHNIQUE: Multiplanar multisequence MRI of the lumbar spine was performed without the administration of intravenous contrast. COMPARISON: 06/16/2024 CLINICAL HISTORY: Back pain; Complaint: Pain; How long: 1 mo +; Previous Surgery or injury to area: No; Additional symptoms with Explanation if given: See patient hx/encounters.; Previous MRI: No; Additional Comments: Hx of HTN (No Meds) FINDINGS: BONES AND ALIGNMENT: Normal alignment. Normal vertebral body heights. Bone marrow signal is unremarkable. Status post bilateral sacroplasty. Incompletely visualized areas of heterogeneous T2-weighted signal at the treated sacral ala. SPINAL CORD: The conus terminates normally. SOFT TISSUES: No paraspinal mass. L1-L2: No significant disc herniation. No spinal canal stenosis or neural foraminal narrowing. L2-L3: No significant  disc herniation. No spinal canal stenosis or neural foraminal narrowing. L3-L4: Small disc bulge without spinal canal or neural foraminal stenosis. L4-L5: Disc height loss with minimal bulging. No spinal canal or neural foraminal stenosis. L5-S1: Small left subarticular disc protrusion without stenosis. IMPRESSION: 1. Status post bilateral sacroplasty with incompletely visualized treated fractures of the sacral ala. 2. Mild multilevel degenerative disc disease without stenosis. Electronically signed by: Franky Stanford MD 07/19/2024 01:32 AM EDT RP Workstation: [id number]   MR PELVIS WO CONTRAST Result Date: 07/11/2024 CLINICAL DATA:  Low back and sacral pain for over 1 month. The patient underwent sacroplasty 06/29/2024. EXAM: MRI PELVIS WITHOUT CONTRAST TECHNIQUE: Multiplanar, multisequence MR imaging of the pelvis was performed. No intravenous contrast was administered. COMPARISON:  MRI lumbar spine 06/16/2024. FINDINGS: Bones/Joint/Cartilage There is both edema and areas of decreased signal within the sacrum bilaterally consistent with the sacral fractures seen on the prior lumbar spine MRI and bilateral sacroplasty. Also seen is avascular necrosis of the femoral heads bilaterally. No fragmentation or collapse of the femoral heads is identified. No acute fracture is seen. There is mild appearing bilateral hip osteoarthritis. Ligaments Intact. Muscles and Tendons Intact and normal in appearance. Soft tissues Postoperative change of hysterectomy noted.  Otherwise negative. IMPRESSION: 1. Status post bilateral sacroplasty. No acute abnormality is identified. 2. 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 3. Mild appearing bilateral hip osteoarthritis. Electronically Signed   By: Debby Prader M.D.   On: 07/11/2024 09:57      Labs: BNP (last 3 results) Recent Labs    03/27/24 1410 05/17/24 1935 07/27/24 1635  BNP 99.7 140.1* 73.1   Basic Metabolic  Panel: Recent Labs  Lab 07/27/24 1635 07/27/24 2043 07/28/24 0404 07/28/24 1348 07/28/24 2126 07/30/24 1259 07/31/24 0526 08/01/24 0523  NA 127*   < > 133* 133* 132* 133* 135  --   K 3.1*   < > 3.4* 3.9 3.8 3.7 3.4* 3.4*  CL 89*   < > 100 98 100 99 102  --   CO2 27   < > 25 23 23 23 25   --   GLUCOSE 127*   < > 126* 132* 121* 110* 99  --   BUN 16   < > 16 13 18 13 13   --   CREATININE 1.03*   < > 1.24* 0.94 1.07* 0.80 0.80  --   CALCIUM  9.4   < > 9.3 9.4 9.4 9.6 9.4  --   MG 1.7  --   --   --   --  2.2 2.2  --   PHOS 3.1  --   --   --   --   --   --   --    < > =  values in this interval not displayed.   Liver Function Tests: Recent Labs  Lab 07/27/24 1635  AST 32  ALT 21  ALKPHOS 99  BILITOT 1.0  PROT 7.2  ALBUMIN 3.7   Recent Labs  Lab 07/27/24 1635  LIPASE 35   No results for input(s): AMMONIA in the last 168 hours. CBC: Recent Labs  Lab 07/27/24 1635 07/28/24 0404 07/30/24 1259 07/31/24 0526  WBC 16.0* 26.3* 13.2* 9.3  NEUTROABS 14.6*  --   --   --   HGB 9.6* 9.0* 9.9* 8.9*  HCT 31.3* 29.6* 33.0* 29.1*  MCV 79.6* 82.9 85.1 81.5  PLT 474* 419* 405* 415*   Cardiac Enzymes: No results for input(s): CKTOTAL, CKMB, CKMBINDEX, TROPONINI in the last 168 hours. BNP: Invalid input(s): POCBNP CBG: No results for input(s): GLUCAP in the last 168 hours. D-Dimer No results for input(s): DDIMER in the last 72 hours. Hgb A1c No results for input(s): HGBA1C in the last 72 hours. Lipid Profile No results for input(s): CHOL, HDL, LDLCALC, TRIG, CHOLHDL, LDLDIRECT in the last 72 hours. Thyroid  function studies No results for input(s): TSH, T4TOTAL, T3FREE, THYROIDAB in the last 72 hours.  Invalid input(s): FREET3 Anemia work up No results for input(s): VITAMINB12, FOLATE, FERRITIN, TIBC, IRON, RETICCTPCT in the last 72 hours. Urinalysis    Component Value Date/Time   COLORURINE STRAW (A) 07/27/2024 1828    APPEARANCEUR CLEAR (A) 07/27/2024 1828   APPEARANCEUR Hazy 11/12/2012 1355   LABSPEC 1.010 07/27/2024 1828   LABSPEC 1.005 11/12/2012 1355   PHURINE 7.0 07/27/2024 1828   GLUCOSEU >=500 (A) 07/27/2024 1828   GLUCOSEU Negative 11/12/2012 1355   HGBUR NEGATIVE 07/27/2024 1828   BILIRUBINUR NEGATIVE 07/27/2024 1828   BILIRUBINUR Negative 11/12/2012 1355   KETONESUR NEGATIVE 07/27/2024 1828   PROTEINUR NEGATIVE 07/27/2024 1828   NITRITE NEGATIVE 07/27/2024 1828   LEUKOCYTESUR NEGATIVE 07/27/2024 1828   LEUKOCYTESUR Trace 11/12/2012 1355   Sepsis Labs Recent Labs  Lab 07/27/24 1635 07/28/24 0404 07/30/24 1259 07/31/24 0526  WBC 16.0* 26.3* 13.2* 9.3   Microbiology Recent Results (from the past 240 hours)  Blood Culture (routine x 2)     Status: Abnormal   Collection Time: 07/27/24  4:35 PM   Specimen: BLOOD  Result Value Ref Range Status   Specimen Description   Final    BLOOD LEFT ANTECUBITAL Performed at [facility], [address] [place], [place] [address]    Special Requests   Final    BOTTLES DRAWN AEROBIC AND ANAEROBIC Blood Culture adequate volume Performed at [facility], [address] [place], [place] [address]    Culture  Setup Time   Final    GRAM NEGATIVE RODS AEROBIC BOTTLE ONLY Organism ID to follow CRITICAL RESULT CALLED TO, READ BACK BY AND VERIFIED WITH: ALEX CHAPPELL [id number] 07/28/24 MU Performed at [facility], [address] [place], [place] [address]    Culture PSEUDOMONAS AERUGINOSA (A)  Final   Report Status 07/30/2024 FINAL  Final   Organism ID, Bacteria PSEUDOMONAS AERUGINOSA  Final      Susceptibility   Pseudomonas aeruginosa - MIC*    MEROPENEM <=0.25 SENSITIVE Sensitive     CIPROFLOXACIN  0.12 SENSITIVE Sensitive     IMIPENEM 2 SENSITIVE Sensitive     PIP/TAZO Value in next row Sensitive ug/mL     16 SENSITIVEThis is a modified FDA-approved test that has been validated and its performance characteristics  determined by the reporting laboratory.  This laboratory is  certified under the Clinical Laboratory Improvement Amendments CLIA as qualified to perform high complexity clinical laboratory testing.    CEFEPIME  Value in next row Sensitive      16 SENSITIVEThis is a modified FDA-approved test that has been validated and its performance characteristics determined by the reporting laboratory.  This laboratory is certified under the Clinical Laboratory Improvement Amendments CLIA as qualified to perform high complexity clinical laboratory testing.    CEFTAZIDIME/AVIBACTAM Value in next row Sensitive ug/mL     16 SENSITIVEThis is a modified FDA-approved test that has been validated and its performance characteristics determined by the reporting laboratory.  This laboratory is certified under the Clinical Laboratory Improvement Amendments CLIA as qualified to perform high complexity clinical laboratory testing.    CEFTOLOZANE/TAZOBACTAM Value in next row Sensitive ug/mL     16 SENSITIVEThis is a modified FDA-approved test that has been validated and its performance characteristics determined by the reporting laboratory.  This laboratory is certified under the Clinical Laboratory Improvement Amendments CLIA as qualified to perform high complexity clinical laboratory testing.    TOBRAMYCIN Value in next row Sensitive      16 SENSITIVEThis is a modified FDA-approved test that has been validated and its performance characteristics determined by the reporting laboratory.  This laboratory is certified under the Clinical Laboratory Improvement Amendments CLIA as qualified to perform high complexity clinical laboratory testing.    CEFTAZIDIME Value in next row Sensitive      16 SENSITIVEThis is a modified FDA-approved test that has been validated and its performance characteristics determined by the reporting laboratory.  This laboratory is certified under the Clinical Laboratory Improvement Amendments CLIA as qualified  to perform high complexity clinical laboratory testing.    * PSEUDOMONAS AERUGINOSA  Blood Culture (routine x 2)     Status: None   Collection Time: 07/27/24  4:35 PM   Specimen: BLOOD  Result Value Ref Range Status   Specimen Description BLOOD LEFT HAND  Final   Special Requests   Final    BOTTLES DRAWN AEROBIC AND ANAEROBIC Blood Culture results may not be optimal due to an inadequate volume of blood received in culture bottles   Culture   Final    NO GROWTH 5 DAYS Performed at [facility], [address] [place], [place] [address]    Report Status 08/01/2024 FINAL  Final  Resp panel by RT-PCR (RSV, Flu A&B, Covid) Anterior Nasal Swab     Status: None   Collection Time: 07/27/24  4:35 PM   Specimen: Anterior Nasal Swab  Result Value Ref Range Status   SARS Coronavirus 2 by RT PCR NEGATIVE NEGATIVE Final    Comment: (NOTE) SARS-CoV-2 target nucleic acids are NOT DETECTED.  The SARS-CoV-2 RNA is generally detectable in upper respiratory specimens during the acute phase of infection. The lowest concentration of SARS-CoV-2 viral copies this assay can detect is 138 copies/mL. A negative result does not preclude SARS-Cov-2 infection and should not be used as the sole basis for treatment or other patient management decisions. A negative result may occur with  improper specimen collection/handling, submission of specimen other than nasopharyngeal swab, presence of viral mutation(s) within the areas targeted by this assay, and inadequate number of viral copies(<138 copies/mL). A negative result must be combined with clinical observations, patient history, and epidemiological information. The expected result is Negative.  Fact Sheet for Patients:  BloggerCourse.com  Fact Sheet for Healthcare Providers:  SeriousBroker.it  This test is no t yet approved or cleared  by the [place]  FDA and  has been authorized for  detection and/or diagnosis of SARS-CoV-2 by FDA under an Emergency Use Authorization (EUA). This EUA will remain  in effect (meaning this test can be used) for the duration of the COVID-19 declaration under Section 564(b)(1) of the Act, 21 U.S.C.section 360bbb-3(b)(1), unless the authorization is terminated  or revoked sooner.       Influenza A by PCR NEGATIVE NEGATIVE Final   Influenza B by PCR NEGATIVE NEGATIVE Final    Comment: (NOTE) The Xpert Xpress SARS-CoV-2/FLU/RSV plus assay is intended as an aid in the diagnosis of influenza from Nasopharyngeal swab specimens and should not be used as a sole basis for treatment. Nasal washings and aspirates are unacceptable for Xpert Xpress SARS-CoV-2/FLU/RSV testing.  Fact Sheet for Patients: BloggerCourse.com  Fact Sheet for Healthcare Providers: SeriousBroker.it  This test is not yet approved or cleared by the [place]  FDA and has been authorized for detection and/or diagnosis of SARS-CoV-2 by FDA under an Emergency Use Authorization (EUA). This EUA will remain in effect (meaning this test can be used) for the duration of the COVID-19 declaration under Section 564(b)(1) of the Act, 21 U.S.C. section 360bbb-3(b)(1), unless the authorization is terminated or revoked.     Resp Syncytial Virus by PCR NEGATIVE NEGATIVE Final    Comment: (NOTE) Fact Sheet for Patients: BloggerCourse.com  Fact Sheet for Healthcare Providers: SeriousBroker.it  This test is not yet approved or cleared by the [place]  FDA and has been authorized for detection and/or diagnosis of SARS-CoV-2 by FDA under an Emergency Use Authorization (EUA). This EUA will remain in effect (meaning this test can be used) for the duration of the COVID-19 declaration under Section 564(b)(1) of the Act, 21 U.S.C. section 360bbb-3(b)(1), unless the authorization is  terminated or revoked.  Performed at [facility], [address] [place], [place] [address]   Blood Culture ID Panel (Reflexed)     Status: Abnormal   Collection Time: 07/27/24  4:35 PM  Result Value Ref Range Status   Enterococcus faecalis NOT DETECTED NOT DETECTED Final   Enterococcus Faecium NOT DETECTED NOT DETECTED Final   Listeria monocytogenes NOT DETECTED NOT DETECTED Final   Staphylococcus species NOT DETECTED NOT DETECTED Final   Staphylococcus aureus (BCID) NOT DETECTED NOT DETECTED Final   Staphylococcus epidermidis NOT DETECTED NOT DETECTED Final   Staphylococcus lugdunensis NOT DETECTED NOT DETECTED Final   Streptococcus species NOT DETECTED NOT DETECTED Final   Streptococcus agalactiae NOT DETECTED NOT DETECTED Final   Streptococcus pneumoniae NOT DETECTED NOT DETECTED Final   Streptococcus pyogenes NOT DETECTED NOT DETECTED Final   A.calcoaceticus-baumannii NOT DETECTED NOT DETECTED Final   Bacteroides fragilis NOT DETECTED NOT DETECTED Final   Enterobacterales NOT DETECTED NOT DETECTED Final   Enterobacter cloacae complex NOT DETECTED NOT DETECTED Final   Escherichia coli NOT DETECTED NOT DETECTED Final   Klebsiella aerogenes NOT DETECTED NOT DETECTED Final   Klebsiella oxytoca NOT DETECTED NOT DETECTED Final   Klebsiella pneumoniae NOT DETECTED NOT DETECTED Final   Proteus species NOT DETECTED NOT DETECTED Final   Salmonella species NOT DETECTED NOT DETECTED Final   Serratia marcescens NOT DETECTED NOT DETECTED Final   Haemophilus influenzae NOT DETECTED NOT DETECTED Final   Neisseria meningitidis NOT DETECTED NOT DETECTED Final   Pseudomonas aeruginosa DETECTED (A) NOT DETECTED Final    Comment: CRITICAL RESULT CALLED TO, READ BACK BY AND VERIFIED WITH: ALEX CHAPPELL 1739 07/28/24 MU    Stenotrophomonas maltophilia  NOT DETECTED NOT DETECTED Final   Candida albicans NOT DETECTED NOT DETECTED Final   Candida auris NOT DETECTED NOT DETECTED Final    Candida glabrata NOT DETECTED NOT DETECTED Final   Candida krusei NOT DETECTED NOT DETECTED Final   Candida parapsilosis NOT DETECTED NOT DETECTED Final   Candida tropicalis NOT DETECTED NOT DETECTED Final   Cryptococcus neoformans/gattii NOT DETECTED NOT DETECTED Final   CTX-M ESBL NOT DETECTED NOT DETECTED Final   Carbapenem resistance IMP NOT DETECTED NOT DETECTED Final   Carbapenem resistance KPC NOT DETECTED NOT DETECTED Final   Carbapenem resistance NDM NOT DETECTED NOT DETECTED Final   Carbapenem resistance VIM NOT DETECTED NOT DETECTED Final    Comment: Performed at [facility], [address] [place], [place] [address]  Expectorated Sputum Assessment w Gram Stain, Rflx to Resp Cult     Status: None   Collection Time: 07/27/24  9:38 PM   Specimen: Expectorated Sputum  Result Value Ref Range Status   Specimen Description EXPECTORATED SPUTUM  Final   Special Requests NONE  Final   Sputum evaluation   Final    THIS SPECIMEN IS ACCEPTABLE FOR SPUTUM CULTURE Performed at [facility], [address], [place], [place] [address]    Report Status 07/27/2024 FINAL  Final  Culture, Respiratory w Gram Stain     Status: None   Collection Time: 07/27/24  9:38 PM  Result Value Ref Range Status   Specimen Description   Final    EXPECTORATED SPUTUM Performed at [facility], [address] [place], [place] [address]    Special Requests   Final    NONE Reflexed from [id number] Performed at [facility], [address] [place], [place] [address]    Gram Stain   Final    FEW WBC PRESENT, PREDOMINANTLY PMN MODERATE GRAM NEGATIVE RODS FEW GRAM POSITIVE COCCI Performed at [facility], [address], [place], [place] [address]    Culture MODERATE PSEUDOMONAS AERUGINOSA  Final   Report Status 07/30/2024 FINAL  Final   Organism ID, Bacteria PSEUDOMONAS AERUGINOSA  Final      Susceptibility   Pseudomonas aeruginosa - MIC*    MEROPENEM <=0.25  SENSITIVE Sensitive     CIPROFLOXACIN  0.25 SENSITIVE Sensitive     IMIPENEM 2 SENSITIVE Sensitive     PIP/TAZO Value in next row Sensitive ug/mL     16 SENSITIVEThis is a modified FDA-approved test that has been validated and its performance characteristics determined by the reporting laboratory.  This laboratory is certified under the Clinical Laboratory Improvement Amendments CLIA as qualified to perform high complexity clinical laboratory testing.    CEFEPIME  Value in next row Sensitive      16 SENSITIVEThis is a modified FDA-approved test that has been validated and its performance characteristics determined by the reporting laboratory.  This laboratory is certified under the Clinical Laboratory Improvement Amendments CLIA as qualified to perform high complexity clinical laboratory testing.    CEFTAZIDIME/AVIBACTAM Value in next row Sensitive ug/mL     16 SENSITIVEThis is a modified FDA-approved test that has been validated and its performance characteristics determined by the reporting laboratory.  This laboratory is certified under the Clinical Laboratory Improvement Amendments CLIA as qualified to perform high complexity clinical laboratory testing.    CEFTOLOZANE/TAZOBACTAM Value in next row Sensitive ug/mL     16 SENSITIVEThis is a modified FDA-approved test that has been validated and its performance characteristics determined by the reporting laboratory.  This laboratory is certified under the Clinical Laboratory Improvement Amendments CLIA as qualified to perform high complexity clinical laboratory testing.    TOBRAMYCIN Value in next row Sensitive      16 SENSITIVEThis is a modified FDA-approved test that has been validated and its performance characteristics determined by the reporting laboratory.  This laboratory is certified under the Clinical Laboratory Improvement Amendments CLIA as qualified to perform high complexity clinical laboratory testing.    CEFTAZIDIME Value in next row  Sensitive      16 SENSITIVEThis is a modified FDA-approved test that has been validated and its performance characteristics determined by the reporting laboratory.  This laboratory is certified under the Clinical Laboratory Improvement Amendments CLIA as qualified to perform high complexity clinical laboratory testing.    * MODERATE PSEUDOMONAS AERUGINOSA  MRSA Next Gen by PCR, Nasal     Status: None   Collection Time: 07/28/24  2:15 AM   Specimen: Nasal Mucosa; Nasal Swab  Result Value Ref Range Status   MRSA by PCR Next Gen NOT DETECTED NOT DETECTED Final    Comment: (NOTE) The GeneXpert MRSA Assay (FDA approved for NASAL specimens only), is one component of a comprehensive MRSA colonization surveillance program. It is not intended to diagnose MRSA infection nor to guide or monitor treatment for MRSA infections. Test performance is not FDA approved in patients less than 58 years old. Performed at [facility], [address] [place], [place] [address]   Respiratory (~20 pathogens) panel by PCR     Status: None   Collection Time: 07/28/24 11:15 AM   Specimen: Nasopharyngeal Swab; Respiratory  Result Value Ref Range Status   Adenovirus NOT DETECTED NOT DETECTED Final   Coronavirus 229E NOT DETECTED NOT DETECTED Final    Comment: (NOTE) The Coronavirus on the Respiratory Panel, DOES NOT test for the novel  Coronavirus (2019 nCoV)    Coronavirus HKU1 NOT DETECTED NOT DETECTED Final   Coronavirus NL63 NOT DETECTED NOT DETECTED Final   Coronavirus OC43 NOT DETECTED NOT DETECTED Final   Metapneumovirus NOT DETECTED NOT DETECTED Final   Rhinovirus / Enterovirus NOT DETECTED NOT DETECTED Final   Influenza A NOT DETECTED NOT DETECTED Final   Influenza B NOT DETECTED NOT DETECTED Final   Parainfluenza Virus 1 NOT DETECTED NOT DETECTED Final   Parainfluenza Virus 2 NOT DETECTED NOT DETECTED Final   Parainfluenza Virus 3 NOT DETECTED NOT DETECTED Final   Parainfluenza Virus 4 NOT  DETECTED NOT DETECTED Final   Respiratory Syncytial Virus NOT DETECTED NOT DETECTED Final   Bordetella pertussis NOT DETECTED NOT DETECTED Final   Bordetella Parapertussis NOT DETECTED NOT DETECTED Final   Chlamydophila pneumoniae NOT DETECTED NOT DETECTED Final   Mycoplasma pneumoniae NOT DETECTED NOT DETECTED Final    Comment: Performed at [facility], [address] [place], [place] [address]  Culture, blood (Routine X 2) w Reflex to ID Panel     Status: None (Preliminary result)   Collection Time: 07/30/24  7:48 AM   Specimen: BLOOD  Result Value Ref Range Status   Specimen Description BLOOD BLOOD LEFT HAND  Final   Special Requests   Final    BOTTLES DRAWN AEROBIC ONLY Blood Culture results may not be optimal due to an inadequate volume of blood received in culture bottles   Culture   Final    NO GROWTH 2 DAYS Performed at [facility], [address] [place], [place] [address]    Report Status PENDING  Incomplete  Culture, blood (Routine X 2) w Reflex to ID Panel     Status: None (Preliminary result)   Collection Time: 07/30/24 12:59 PM   Specimen: BLOOD  Result Value Ref Range Status   Specimen Description BLOOD BLOOD RIGHT HAND  Final   Special Requests   Final    BOTTLES DRAWN AEROBIC ONLY Blood Culture results may not be optimal due to an inadequate volume of blood received in culture bottles   Culture   Final    NO GROWTH 2 DAYS Performed at [facility], [address], [place], [place] [address]    Report Status PENDING  Incomplete     Total time spend on discharging this patient, including the last patient exam, discussing the hospital stay, instructions for ongoing care as it relates to all pertinent caregivers, as well as preparing the medical discharge records, prescriptions, and/or referrals as applicable, is 35 minutes.    Ellouise Haber, MD  [facility] 08/01/2024, 8:51 AM

## 2024-08-01 NOTE — Plan of Care (Signed)

## 2024-08-01 NOTE — Plan of Care (Signed)

## 2024-08-04 LAB — CULTURE, BLOOD (ROUTINE X 2)
Culture: NO GROWTH
Culture: NO GROWTH

## 2024-08-10 NOTE — Progress Notes (Signed)
 Follow-up   History of Present Illness: Linda Brady is a 83 y.o. female HTN, COPD on 2 L oxygen, dCHF, hypothyroidism, CKD stage IIIa, A-fib on Eliquis , obesit   presents to clinic for post hospital recheck  Admit date: 07/27/2024 Discharge date: 08/01/2024 Discharge Diagnoses:  Principal Problem: COPD exacerbation (HCC) Active Problems: Sepsis (HCC), pseudomonas bacteremia ? source Myocardial injury Hyponatremia Essential hypertension Dyslipidemia Chronic heart failure with preserved ejection fraction (HFpEF) (HCC) Afib (HCC) Hypokalemia Hypothyroidism Obesity (BMI 30-39.9)    Today no fever, chills, cough, wheezingg and more son x 3 days. No chest [ain, leg pain or swlling. Seem more so since off the prednisone  she said. No rashes, ectopy. Reviewed her last hospitalization. ;  Current Medications:  Current Outpatient Medications  Medication Sig Dispense Refill  . ALPRAZolam  (XANAX ) 0.25 MG tablet TAKE 1 TABLET BY MOUTH AT BEDTIME AS NEEDED 30 tablet 5  . AMIOdarone  (PACERONE ) 200 MG tablet Take 1 tablet by mouth twice daily 60 tablet 0  . apixaban  (ELIQUIS ) 5 mg tablet Take 1 tablet (5 mg total) by mouth 2 (two) times daily 60 tablet 0  . atorvastatin  (LIPITOR) 40 MG tablet Take 1 tablet (40 mg total) by mouth once daily 90 tablet 1  . cholecalciferol  (VITAMIN D3) 1000 unit tablet Take by mouth    . cyanocobalamin  (VITAMIN B12) 1000 MCG tablet Take by mouth    . dapagliflozin  propanediol (FARXIGA ) 10 mg tablet Take 1 tablet (10 mg total) by mouth every morning before breakfast 90 tablet 3  . ELIQUIS  5 mg tablet Take 1 tablet (5 mg total) by mouth 2 (two) times daily 180 tablet 3  . ipratropium-albuteroL  (COMBIVENT  RESPIMAT) 20-100 mcg/actuation inhaler Inhale 1 Puff into the lungs 4 (four) times daily 12 g 3  . isosorbide  mononitrate (IMDUR ) 30 MG ER tablet Take 1 tablet by mouth once daily 90 tablet 3  . levalbuterol  (XOPENEX ) 1.25 mg/3 mL nebulizer solution Take 3 mLs  (1.25 mg total) by nebulization every 6 (six) hours as needed for Wheezing 75 mL 5  . levothyroxine  (SYNTHROID ) 75 MCG tablet Take 1 tablet (75 mcg total) by mouth once daily Take on an empty stomach with a glass of water at least 30-60 minutes before breakfast. 90 tablet 3  . mometasone  furoate (MOMETASONE  INHAL) Inhale into the lungs 1-15.24 mcg    . mometasone -formoterol  (DULERA ) 200-5 mcg/actuation inhaler Inhale 2 inhalations into the lungs 2 (two) times daily 39 g 3  . montelukast  (SINGULAIR ) 10 mg tablet TAKE 1 TABLET BY MOUTH AT BEDTIME 30 tablet 3  . pantoprazole  (PROTONIX ) 40 MG DR tablet Take 1 tablet (40 mg total) by mouth 2 (two) times daily 180 tablet 3  . potassium chloride  (KLOR-CON  M20) 20 MEQ ER tablet Take 40 mEq by mouth 2 (two) times daily    . predniSONE  (DELTASONE ) 5 MG tablet Take 1 tablet by mouth once daily 30 tablet 0  . rOPINIRole  (REQUIP ) 0.25 MG immediate release tablet TAKE 1 TABLET BY MOUTH AT BEDTIME 90 tablet 1  . spironolactone  (ALDACTONE ) 25 MG tablet Take 1 tablet (25 mg total) by mouth once daily for 360 days (Patient taking differently: Take 12.5 mg by mouth once daily) 90 tablet 3  . sucralfate  (CARAFATE ) 1 gram tablet Take 1 g by mouth 4 (four) times daily    . FLUoxetine  (PROZAC ) 20 MG capsule Take 3 capsules (60 mg total) by mouth once daily 270 capsule 3  . gabapentin  (NEURONTIN ) 100 MG capsule Take 1 capsule  p.o. nightly for 3-4 nights and then increase to 1 capsule p.o. twice daily depending on side effects.  Do not take this with Xanax  (Patient not taking: Reported on 08/10/2024) 60 capsule 1   No current facility-administered medications for this visit.    Problem List:  Patient Active Problem List  Diagnosis  . Hypertension  . Thyroid  disease  . Hyperlipidemia  . COPD (chronic obstructive pulmonary disease) (CMS/HHS-HCC)  . Osteoporosis, post-menopausal  . Abdominal pain  . Hypokalemia  . Unstable angina (CMS/HHS-HCC)  . S/P cardiac  catheterization    History: Past Medical History:  Diagnosis Date  . Arthritis   . Chickenpox   . COPD (chronic obstructive pulmonary disease) (CMS/HHS-HCC)   . Depression   . Hyperlipidemia   . Hypertension   . Hypothyroidism   . Migraine headache   . Osteoporosis, post-menopausal   . Plantar fascial fibromatosis   . Thyroid  disease    Thyroid  disease with right thyroid  lobectomy in July of 2002, benign.    Past Surgical History:  Procedure Laterality Date  . HYSTERECTOMY  1994   fibroids  . Tdap  01/2010  . CHOLECYSTECTOMY  June 2013  . EGD  05/24/2013   no repeat per [facility]  . COLONOSCOPY  07/2013   repeat 07/20/2013  . Bilateral oophorectomy    . CARPAL TUNNEL RELEASE    . COLONOSCOPY    . OOPHORECTOMY    . thyroid  surgery    . UPPER GASTROINTESTINAL ENDOSCOPY      Family History  Problem Relation Name Age of Onset  . Coronary Artery Disease (Blocked arteries around heart) Father    . Myocardial Infarction (Heart attack) Father    . Myocardial Infarction (Heart attack) Sister    . Cancer Brother    . Colon cancer Neg Hx    . Colon polyps Neg Hx    . Liver disease Neg Hx    . Ulcers Neg Hx    . Rectal cancer Neg Hx      Social History   Socioeconomic History  . Marital status: Single  Tobacco Use  . Smoking status: Former    Current packs/day: 0.50    Average packs/day: 0.5 packs/day for 15.0 years (7.5 ttl pk-yrs)    Types: Cigarettes  . Smokeless tobacco: Never  Vaping Use  . Vaping status: Never Used  Substance and Sexual Activity  . Alcohol use: No  . Drug use: No  . Sexual activity: Not Currently  Social History Narrative   She is divorced.  She has 3 children.  Holiday representative.   pack cigarettes per day.  No alcohol and no illicit drugs.  No regular exercise.  Eats red meat twice per week,  no fast foods, and  fried foods twice per week.   Social Drivers of Corporate investment banker Strain: Low Risk  (06/16/2024)   Overall  Financial Resource Strain (CARDIA)   . Difficulty of Paying Living Expenses: Not hard at all  Food Insecurity: No Food Insecurity (07/27/2024)   [facility]   Hunger Vital Sign   . Within the past 12 months, you worried that your food would run out before you got the money to buy more.: Never true   . Within the past 12 months, the food you bought just didn't last and you didn't have money to get more.: Never true  Transportation Needs: No Transportation Needs (07/27/2024)   Received from [facility] - Transportation   .  In the past 12 months, has lack of transportation kept you from medical appointments or from getting medications?: No   . In the past 12 months, has lack of transportation kept you from meetings, work, or from getting things needed for daily living?: No    Allergies:  Aleve [naproxen sodium]  Review of Systems: As per above. Pretty much unchanged with the exception that her breathing is improved. No associated cardiopulmonary, GI, GU, dermatological symptoms today. No focal neurological symptoms or psychological changes.   Physical Exam: BP (!) 181/78 (BP Location: Left upper arm, Patient Position: Sitting, BP Cuff Size: Adult)   Pulse 78   Ht 157.5 cm (5' 2)   Wt 76.2 kg (168 lb)   SpO2 98% Comment: 2L POC  BMI 30.73 kg/m  76.2 kg (168 lb) 98% General:  NAD. Able to speak in complete sentences without cough or dyspnea HEENT: Normocephalic, nontraumatic. Extraocular movements intact NECK: Supple. No JVD, nodes, thyromegaly CV: RRR no murmurs, gallops, rubs PULM: Normal respiratory effort, Clear to auscultation bilaterally without wheezing or crackles EXTREMITIES: No significant edema, cyanosis or Homans'signs SKIN: Fair turgor. No rashes LYMPHATIC: No nodes NEURO: No gross deficits PSYCH: Appropriate affect, alert, oriented   Diagnostics: Fvc 1.79 liters ( 72 %), fev1 0.98  liters ( 54 %), fef 29%, exp flow volume loop is delayed,  moderate obstruction / copd   SPO2 At Rest On Room Air: 92% SPO2 With Exertion On Room Air: 84% SPO2 On Oxygen 2 LPM With Exertion: 96%    Fvc 1.62 liters ( 67 %)< fev1 0.88 ( 46%), fef 29 %  severe obstruction trending down   .  Prox LAD lesion is 30% stenosed.  SABRA Pais RCA to Prox RCA lesion is 20% stenosed.   1.  Mild nonobstructive coronary artery disease.  2.  Fick cardiac output of 9.0 L/min and Fick cardiac index of 4.9  L/min/m with the following hemodynamics   Right atrial pressure mean of 7 mmHg   Right ventricular pressure 48/27 with end-diastolic pressure of 14 mmHg   PA pressure 48/17 with a mean of 29 mmHg   Wedge pressure mean of 18 mmHg V waves to 27 mmHg   PVR 1.2 Woods units   PA pulsatility index of 4   Recommendation: Continue evaluation for aortic valve intervention.   EXAM:  PORTABLE CHEST 1 VIEW   COMPARISON:  05/19/2024   FINDINGS:  Stable mild cardiac enlargement and previous TAVR. Similar vascular  and basilar interstitial prominence/scarring. No superimposed acute  pneumonia, acute CHF, large effusion or pneumothorax. Trachea  midline. Aorta atherosclerotic. Bones are osteopenic. Degenerative  changes throughout the spine. Right axillary vascular stent noted.   IMPRESSION:  1. Cardiomegaly with vascular and basilar interstitial  prominence/scarring.  2. No acute chest process by plain radiography.  Stable exam.    Electronically Signed    By: CHRISTELLA.  Shick M.D.    On: 07/27/2024 17:57    Impression: Ex smoker stage III copd, her weight and desat with activity. Hx of severe aortic stenosis ( for valve replacement) afib ( on eliquis ). the Primary reasons for the dyspnea . Thalia .stable  c/o frequent wheezing steroid sensitive. Hx of anxiety  Continue combivent ,  dulera , singulair  10 mg q day Pred 10 mg q  day ix 4 days, the 5 mg x 7 days, then 5 mg qod x 7 doses and d/c Per above consider Ohtulvayre Omeprazole 20 mg q day POC at 2 liters Weight  loss Low dose xanax   F/u in 12 weeks  Further orders per above

## 2024-08-13 ENCOUNTER — Encounter: Payer: Self-pay | Admitting: Medical

## 2024-08-13 ENCOUNTER — Ambulatory Visit: Attending: Medical | Admitting: Medical

## 2024-08-13 VITALS — BP 150/70 | HR 69 | Ht 63.0 in | Wt 174.1 lb

## 2024-08-13 DIAGNOSIS — R7989 Other specified abnormal findings of blood chemistry: Secondary | ICD-10-CM | POA: Insufficient documentation

## 2024-08-13 DIAGNOSIS — I350 Nonrheumatic aortic (valve) stenosis: Secondary | ICD-10-CM | POA: Diagnosis present

## 2024-08-13 DIAGNOSIS — I480 Paroxysmal atrial fibrillation: Secondary | ICD-10-CM | POA: Diagnosis present

## 2024-08-13 DIAGNOSIS — I5032 Chronic diastolic (congestive) heart failure: Secondary | ICD-10-CM | POA: Insufficient documentation

## 2024-08-13 DIAGNOSIS — I10 Essential (primary) hypertension: Secondary | ICD-10-CM | POA: Insufficient documentation

## 2024-08-13 MED ORDER — ISOSORBIDE MONONITRATE ER 60 MG PO TB24: 60.0000 mg | ORAL_TABLET | Freq: Every day | ORAL | 1 refills | Status: DC

## 2024-08-13 NOTE — Progress Notes (Signed)
 Cardiology Office Note   Date:  08/13/2024  ID:  Linda Brady, DOB 09-10-1941, MRN [id number] PCP: Valora Agent, MD  [facility] Providers Cardiologist:  Redell Cave, MD   History of Present Illness Linda Brady is a 83 y.o. 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   The patient was admitted 09/2023 with chest pain and palpitations found to have new onset atrial fibs treated with IV Cardizem  with conversion to normal sinus rhythm.  She was started on Eliquis .  Echocardiogram showed EF of 65 to 70%, grade 2 diastolic dysfunction, mild MR, mild AI, and severe AAS with a mean gradient of 49.2 mmHg.  Patient was referred to structural heart team for TAVR consideration.  Right and left heart cath November 2024 showed mild nonobstructive CAD.  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  She was seen by [facility] and underwent endovascular repair of right brachial artery with Dr. Magda.  Postop she was started on DAPT and Eliquis  was held.  After 28 days she was transition back to Eliquis .  She was set up for TAVR 12/02/2023 but this was canceled due to hypokalemia.  This was corrected and she underwent successful TAVR with a 23 mm Edwards SAPIEN 3 ultra Resilia THV via the TF approach on 12/16/2023.  Postop echo showed EF 75%, moderate LVH, normally functioning TAVR with a mean gradient of 23 mmHg and trivial PVL, gradients felt to be elevated secondary to high flow state.  She was briefly in A-fib on arrival to the hospital but converted back to sinus after TAVR.  She has resumed on Eliquis  5 mg twice daily.  Patient was last seen 03/31/2024 reporting mild right arm pain and right lower leg  edema.  Lasix  was increased to 40 mg daily.  Patient was admitted in late August 2025 for severe sepsis secondary to pneumonia, pseudomonal bacteremia, COPD exacerbation, chronic hypoxic respiratory failure, mildly elevated troponin, hyponatremia.  High-sensitivity troponin went up to 47, suspected demand ischemia.  Today, the patient reports she has been OK. She has stable lower leg edema on lasix  10mg  daily. She is on 2L O2 at all times. She has rare sharp pain in her chest that she feels is from gas. She is tapering off prednisone .    Studies Reviewed EKG Interpretation Date/Time:  Friday August 13 2024 11:23:13 EDT Ventricular Rate:  69 PR Interval:  220 QRS Duration:  98 QT Interval:  412 QTC Calculation: 441 R Axis:   6  Text Interpretation: Sinus rhythm with 1st degree A-V block Inferior infarct , age undetermined Anteroseptal infarct , age undetermined When compared with ECG of 27-Jul-2024 16:30, PREVIOUS ECG IS PRESENT Confirmed by Franchester, Hannie Shoe ([id number]) on 08/13/2024 11:44:53 AM    Limited echo 01/2024 1. Left ventricular ejection fraction, by estimation, is 60 to 65%. The  left ventricle has normal function. The left ventricle has no regional  wall motion abnormalities. There is moderate concentric left ventricular  hypertrophy. Left ventricular  diastolic parameters are consistent with Grade II diastolic dysfunction  (pseudonormalization).   2. Right ventricular systolic function is normal. The right ventricular  size is normal. There is moderately elevated pulmonary artery systolic  pressure. The estimated right ventricular systolic pressure is 47.6  mmHg.   3. Left atrial size was mildly dilated.   4. The mitral valve is normal in structure. Mild mitral valve  regurgitation. No evidence of mitral stenosis.   5. There is a well seated 23mm Edwards Sapien 3 Ultra Resilia TAVR valve  in the aortic position. On short axis views valvular regurgitation is not  visualized,  however, there is mild-moderate aortic regurgitation (likely  paravalvular) seen in 5 chamber  views (image 49, 67) with a PHT of ~451ms.   6. The inferior vena cava is normal in size with greater than 50%  respiratory variability, suggesting right atrial pressure of 3 mmHg.   Echo 12/2023 1. Left ventricular ejection fraction, by estimation, is >75%. The left  ventricle has hyperdynamic function. The left ventricle has no regional  wall motion abnormalities. There is moderate left ventricular hypertrophy.  Left ventricular diastolic  parameters are consistent with Grade II diastolic dysfunction  (pseudonormalization). Elevated left atrial pressure.   2. Right ventricular systolic function is normal. The right ventricular  size is normal. Tricuspid regurgitation signal is inadequate for assessing  PA pressure.   3. Left atrial size was mildly dilated.   4. The mitral valve is abnormal. Mild mitral valve regurgitation. The  mean mitral valve gradient is 6.0 mmHg with average heart rate of 75 bpm.  Elevated MV gradient likely due to high flow state, as MVA normal  (2.6cm^2) by continuity equation   5. Trivial perivalvular leak seen at the 6 o'clock position. The aortic  valve has been repaired/replaced. Aortic valve regurgitation is trivial.  There is a 23 mm Edwards Sapien 3 Ultra valve present in the aortic  position. Elevated gradients through AV  (Vmax 3.6m/s, MG ) likely due to high flow state (SV 122cc). EOA  (2.0cm^2) and DI (0.52) do not suggest significant prosthetic valve  stenosis   6. The inferior vena cava is normal in size with <50% respiratory  variability, suggesting right atrial pressure of 8 mmHg.    Limited echo 12/2023 1. Left ventricular ejection fraction, by estimation, is 65 to 70%. The  left ventricle has normal function.   2. Right ventricular systolic function is normal. The right ventricular  size is normal.   3. The mitral valve is degenerative.  Moderate mitral valve regurgitation.  Mild to moderate mitral stenosis.   4. TAVR Pre procedure, severe AI and AS with mean gradient was .  Peak gradient 71 m m Hg. AVA 0.88cm2. DVI 0.26.      Post procedure, aortic regurgitation has resolved. Difficult LVOT  acquistion to dynamic function (DVI and EOA not reported). Mean gradient 7  mm Hg. Peak gradient 13 mm Hg.   Cardiac CTA 10/2023 IMPRESSION: 1. Tricuspid aortic valve with severe calcifications (AV calcium  score 1940)   2. Aortic annulus measures 24mm x 23mm in diameter with perimeter 71mm and area 351mm^2. Mild annular calcifications adjacent to left coronary cusp. Annular measurements suitable for delivery of 23mm Edwards Sapien 3 valve.   3. Sufficient coronary to annulus distance, measures 13mm to left main and 13mm to RCA   4.  Optimum Fluoroscopic Angle for Delivery:   LAO 13 CRA 4   5.  Coronary calcium  score 232 (65th percentile)   R/L heart cath 10/2023   Prox LAD lesion is 30% stenosed.   Ost RCA to Prox RCA lesion is 20% stenosed.   1.  Mild nonobstructive coronary artery disease. 2.  Fick cardiac output of 9.0 L/min and Fick cardiac  index of 4.9 L/min/m with the following hemodynamics             Right atrial pressure mean of 7 mmHg             Right ventricular pressure 48/27 with end-diastolic pressure of 14 mmHg             PA pressure 48/17 with a mean of 29 mmHg             Wedge pressure mean of 18 mmHg V waves to 27 mmHg             PVR 1.2 Woods units             PA pulsatility index of 4   Recommendation: Continue evaluation for aortic valve intervention.  Echo 09/2023 1. Left ventricular ejection fraction, by estimation, is 65 to 70%. The  left ventricle has normal function. The left ventricle has no regional  wall motion abnormalities. Left ventricular diastolic parameters are  consistent with Grade II diastolic  dysfunction (pseudonormalization).   2. Right ventricular systolic  function is normal. The right ventricular  size is normal. There is mildly elevated pulmonary artery systolic  pressure.   3. The mitral valve is degenerative. Mild mitral valve regurgitation.   4. The aortic valve is calcified. Aortic valve regurgitation is mild.  Severe aortic valve stenosis. Aortic valve area, by VTI measures 0.99 cm.  Aortic valve mean gradient measures 49.2 mmHg. Aortic valve Vmax measures  4.56 m/s.   5. The inferior vena cava is normal in size with greater than 50%  respiratory variability, suggesting right atrial pressure of 3 mmHg.   Conclusion(s)/Recommendation(s): Findings consistent with severe valvular  heart disease.      Physical Exam VS:  BP (!) 150/70 (BP Location: Left Arm, Patient Position: Sitting, Cuff Size: Normal)   Pulse 69   Ht 5' 3 (1.6 m)   Wt 174 lb 2 oz (79 kg)   SpO2 99% Comment: on 2 Liters of oxygen  BMI 30.84 kg/m        Wt Readings from Last 3 Encounters:  08/13/24 174 lb 2 oz (79 kg)  08/01/24 179 lb 0.2 oz (81.2 kg)  06/15/24 181 lb (82.1 kg)    GEN: Well nourished, well developed in no acute distress NECK: No JVD; No carotid bruits CARDIAC: RRR, no murmurs, rubs, gallops RESPIRATORY:  Clear to auscultation without rales, wheezing or rhonchi  ABDOMEN: Soft, non-tender, non-distended EXTREMITIES:  No edema; No deformity   ASSESSMENT AND PLAN  HFpEF Patient reports stable lower leg edema. She is weaning off prednisone .  On exam I do not appreciate significant lower leg edema.  Continue takes Lasix  10 mg daily and spironolactone  25 mg daily.  Elevated troponin Nonobstructive CAD HS troponin mildly elevated during hospitalization to 53. Patient denies chest pain, suspect supply/demand mismatch.  Patient has occasional chest discomfort from acid reflux.  She has chronic SOB on 2L O2. She has echo scheduled in in January.   S/p TAVR 12/2023 Echo in February 2025 showed EF of 60%, normally functioning TAVR valve.  Repeat  echo ordered.   Hypertension Blood pressure is elevated today.  I will increase Imdur  to 60 mg daily.  Continue spironolactone  25 mg daily.  Paroxysmal A-fib EKG today shows sinus rhythm with a first-degree AV block.  Continue amiodarone  200 mg daily and Eliquis  5 mg daily.       Dispo: Follow-up in 4 months  Signed, Kayln Garceau H  [place], PA-C

## 2024-08-13 NOTE — Patient Instructions (Signed)
 Medication Instructions:  INCREASE the Imdur  to 60 mg once daily *If you need a refill on your cardiac medications before your next appointment, please call your pharmacy*  Lab Work: None ordered If you have labs (blood work) drawn today and your tests are completely normal, you will receive your results only by: MyChart Message (if you have MyChart) OR A paper copy in the mail If you have any lab test that is abnormal or we need to change your treatment, we will call you to review the results.  Testing/Procedures: Your physician has requested that you have an limited echocardiogram. Echocardiography is a painless test that uses sound waves to create images of your heart. It provides your doctor with information about the size and shape of your heart and how well your heart's chambers and valves are working.   You may receive an ultrasound enhancing agent through an IV if needed to better visualize your heart during the echo. This procedure takes approximately one hour.  There are no restrictions for this procedure.  This will take place at [address] [address], [place] [address]  Please note: We ask at that you not bring children with you during ultrasound (echo/ vascular) testing. Due to room size and safety concerns, children are not allowed in the ultrasound rooms during exams. Our front office staff cannot provide observation of children in our lobby area while testing is being conducted. An adult accompanying a patient to their appointment will only be allowed in the ultrasound room at the discretion of the ultrasound technician under special circumstances. We apologize for any inconvenience.   Follow-Up: At [facility], you and your health needs are our priority.  As part of our continuing mission to provide you with exceptional heart care, our providers are all part of one team.  This team includes your primary Cardiologist (physician) and Advanced  Practice Providers or APPs (Physician Assistants and Nurse Practitioners) who all work together to provide you with the care you need, when you need it.  Your next appointment:   4 month(s)  Provider:   You may see Redell Cave, MD or one of the following Advanced Practice Providers on your designated Care Team:   Cadence Franchester, [place]  We recommend signing up for the patient portal called MyChart.  Sign up information is provided on this After Visit Summary.  MyChart is used to connect with patients for Virtual Visits (Telemedicine).  Patients are able to view lab/test results, encounter notes, upcoming appointments, etc.  Non-urgent messages can be sent to your provider as well.   To learn more about what you can do with MyChart, go to [email or web address].

## 2024-08-23 ENCOUNTER — Telehealth: Payer: Self-pay | Admitting: Cardiology

## 2024-08-23 NOTE — Telephone Encounter (Signed)
  Pt c/o medication issue:  1. Name of Medication: isosorbide  mononitrate (IMDUR ) 60 MG 24 hr tablet   2. How are you currently taking this medication (dosage and times per day)? Cadence Furth at last visit told patient to take 60 mg daily instead of 30 mg daily that she was taking before  3. Are you having a reaction (difficulty breathing--STAT)? Heart palpitations  4. What is your medication issue? Patient states that since upping the dosage of her meds that she has had some heart palpitations. She would like to discuss.

## 2024-08-23 NOTE — Telephone Encounter (Signed)
 Called patient - spoke with daughter - patient has not taken the 60 mg dose x 2-3 days, remains at 30 mg daily of imdur  because she states that the higher dose causes her to feel palpitations  Advised daughter to keep her at the 30 mg (tolerated dose) until she hears back from the office  Please advise  - A

## 2024-08-25 ENCOUNTER — Other Ambulatory Visit: Payer: Self-pay

## 2024-08-25 ENCOUNTER — Other Ambulatory Visit: Payer: Self-pay | Admitting: Interventional Radiology

## 2024-08-25 DIAGNOSIS — G8928 Other chronic postprocedural pain: Secondary | ICD-10-CM

## 2024-08-25 DIAGNOSIS — M545 Low back pain, unspecified: Secondary | ICD-10-CM

## 2024-08-25 NOTE — Telephone Encounter (Signed)
 Returned call to patient's daughter - patient has tried ( 1 day) taking Imdur  30 mg in the morning and 30 mg in the evening - and then went back to 30 mg daily Daughter advised that she would have patient try 30 mg Bid for a longer time - if no palpitations she will continue with the 30 mg BID, if palpitations - she will go back to the 30 mg daily and notify the office

## 2024-08-31 ENCOUNTER — Ambulatory Visit
Admission: RE | Admit: 2024-08-31 | Discharge: 2024-08-31 | Disposition: A | Discharge status: Home / Self Care | Source: Ambulatory Visit | Attending: Interventional Radiology | Admitting: Interventional Radiology

## 2024-08-31 DIAGNOSIS — G8928 Other chronic postprocedural pain: Secondary | ICD-10-CM

## 2024-08-31 DIAGNOSIS — M545 Low back pain, unspecified: Secondary | ICD-10-CM

## 2024-08-31 HISTORY — PX: IR RADIOLOGIST EVAL & MGMT: IMG5224

## 2024-08-31 NOTE — Progress Notes (Signed)
 Chief Complaint: Patient was seen in consultation today for back pain at the request of Linda Brady  Referring Physician(s): Linda Brady  Supervising Physician: Linda Wilkie  History of Present Illness: Linda Brady is a 83 y.o. 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 Karalee) who presents today to discuss ongoing back pain. Linda Brady experience sudden onset low back pain with radiation down her left leg in early July of this year. The pain did not respond to conservative management and she underwent MRI lumbar spine on 06/16/24 which showed:  1. Acute bilateral sacral ala fractures, left greater than right, with associated marrow edema and mild focal anterior angulation at S2. 2. No acute lumbar spine findings. 3. Minimal lumbar spondylosis for age, without spinal stenosis, significant foraminal narrowing or nerve root impingement.  She subsequently underwent technically successful bilateral sacroplasty in IR on 06/29/24 and experienced pain improvement post procedure. She recently reported to her PCP that she continues to have relief of left sided back pain but feels that her right side has shown little improvement which has caused her to use a walker because she cannot bear full weight on her leg due to pain. She underwent MRI lumbar spine and pelvis w/o contrast on 07/19/24 which showed:  1. Status post bilateral sacroplasty with incompletely visualized treated fractures of the sacral ala. No acute abnormality is identified. 2. Mild multilevel degenerative disc disease without stenosis. 3. 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 4. Mild appearing bilateral hip osteoarthritis  She presents to IR today to discuss ongoing back  pain. Her daughter is with her.  Her pain is predominantly right-sided and seems to coincide with her right sacroiliac joint.   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 Wendel and Dr. Maryjane   Thyroid  disease     Past Surgical History:  Procedure Laterality Date   BREAST CYST ASPIRATION Left 10 plus yrs   benign-twice   CATARACT EXTRACTION W/PHACO Left 03/20/2023   Procedure: CATARACT EXTRACTION PHACO AND INTRAOCULAR LENS PLACEMENT (IOC) LEFT VISION BLUE;  Surgeon: Enola Feliciano Hugger, MD;  Location: [facility];  Service: Ophthalmology;  Laterality: Left;  21.02   01:38.2   INTRAOPERATIVE TRANSTHORACIC ECHOCARDIOGRAM N/A 12/16/2023   Procedure: INTRAOPERATIVE TRANSTHORACIC ECHOCARDIOGRAM;  Surgeon: Wendel Lurena POUR, MD;  Location: [facility] IN[facility] CV LAB;  Service: Cardiovascular;  Laterality: N/A;   IR RADIOLOGIST EVAL & MGMT  06/22/2024   IR VERTEBROPLASTY LUMBAR BX INC UNI/BIL INC/INJECT/IMAGING  06/29/2024   LEFT HEART CATH AND CORONARY ANGIOGRAPHY N/A 09/11/2020   Procedure: LEFT HEART CATH AND CORONARY ANGIOGRAPHY;  Surgeon: Ammon Blunt, MD;  Location: [facility] INVASIVE CV LAB;  Service: Cardiovascular;  Laterality: N/A;   PERIPHERAL VASCULAR INTERVENTION Right 10/24/2023   Procedure: PERIPHERAL VASCULAR INTERVENTION;  Surgeon: Magda Debby SAILOR, MD;  Location: [facility]AS[facility] CV [facility];  Service: Cardiovascular;  Laterality: Right;  right axillary stent   RIGHT/LEFT HEART CATH AND CORONARY ANGIOGRAPHY N/A 10/23/2023   Procedure: RIGHT/LEFT HEART  CATH AND CORONARY ANGIOGRAPHY;  Surgeon: Wendel Lurena POUR, MD;  Location: [facility] INVASIVE CV LAB;  Service: Cardiovascular;  Laterality:  N/A;   UPPER EXTREMITY ANGIOGRAPHY Right 10/24/2023   Procedure: Upper Extremity Angiography;  Surgeon: Magda Debby SAILOR, MD;  Location: [facility] INVASIVE CV LAB;  Service: Cardiovascular;  Laterality: Right;    Allergies: Naproxen sodium  Medications: Prior to Admission medications   Medication Sig Start Date End Date Taking? Authorizing Provider  ALPRAZolam  (XANAX ) 0.25 MG tablet Take 0.25 mg by mouth at bedtime as needed for sleep.    [provider]  amiodarone  (PACERONE ) 200 MG tablet Take 1 tablet (200 mg total) by mouth daily. 01/16/24   Sebastian Lamarr SAUNDERS, PA-C  apixaban  (ELIQUIS ) 5 MG TABS tablet Take 5 mg by mouth 2 (two) times daily.    [provider]  atorvastatin  (LIPITOR) 40 MG tablet Take 1 tablet (40 mg total) by mouth daily. 09/20/23   Alexander, Natalie, DO  cholecalciferol  (VITAMIN D3) 25 MCG (1000 UNIT) tablet Take 1,000 Units by mouth daily.    [provider]  dapagliflozin  propanediol (FARXIGA ) 10 MG TABS tablet Take 1 tablet (10 mg total) by mouth daily before breakfast. 05/26/24   Donette Ellouise LABOR, FNP  furosemide  (LASIX ) 20 MG tablet Take 10 mg by mouth daily.    [provider]  gabapentin  (NEURONTIN ) 100 MG capsule Take 100-200 mg by mouth 2 (two) times daily. Patient not taking: Reported on 08/13/2024 06/16/24   [provider]  Ipratropium-Albuterol  (COMBIVENT  RESPIMAT) 20-100 MCG/ACT AERS respimat Inhale 1 puff into the lungs every 6 (six) hours as needed for wheezing.    [provider]  isosorbide  mononitrate (IMDUR ) 60 MG 24 hr tablet Take 1 tablet (60 mg total) by mouth daily. 08/13/24   Furth, Cadence H, PA-C  levalbuterol  (XOPENEX ) 1.25 MG/3ML nebulizer solution Take 1.25 mg by nebulization every 6 (six) hours as needed. 12/08/23 12/07/24  [provider]  lidocaine  (LIDODERM ) 5 % Place 1 patch onto the skin daily. Patient not taking: Reported on 08/13/2024 06/08/24   [provider]   mometasone -formoterol  (DULERA ) 200-5 MCG/ACT AERO Inhale 2 puffs into the lungs 2 (two) times daily. 12/17/23   Sebastian Lamarr SAUNDERS, PA-C  montelukast  (SINGULAIR ) 10 MG tablet Take 1 tablet by mouth at bedtime. 02/03/24 02/02/25  [provider]  potassium chloride  SA (KLOR-CON  M) 20 MEQ tablet Take 2 tablets (40 mEq total) by mouth daily. 06/25/24   Donette Ellouise LABOR, FNP  predniSONE  (DELTASONE ) 5 MG tablet Take 5 mg by mouth daily with breakfast.    [provider]  rOPINIRole  (REQUIP ) 0.25 MG tablet Take 1 tablet (0.25 mg total) by mouth at bedtime. 10/28/23 08/13/24  Leotis Bogus, MD  spironolactone  (ALDACTONE ) 25 MG tablet Take 1 tablet (25 mg total) by mouth daily. 06/18/24 09/16/24  Donette Ellouise LABOR, FNP  sucralfate  (CARAFATE ) 1 g tablet Take 1 g by mouth 4 (four) times daily -  with meals and at bedtime.    [provider]  vitamin B-12 (CYANOCOBALAMIN ) 1000 MCG tablet Take 1,000 mcg by mouth daily.    [provider]     Family History  Problem Relation Age of Onset   Breast cancer Neg Hx     Social History   Socioeconomic History   Marital status: Widowed    Spouse name: Not on file   Number of children: Not on file   Years of education: Not on file   Highest education level: Not  on file  Occupational History   Not on file  Tobacco Use   Smoking status: Former    Current packs/day: 0.50    Average packs/day: 0.5 packs/day for 12.0 years (6.0 ttl pk-yrs)    Types: Cigarettes   Smokeless tobacco: Never   Tobacco comments:    7-8 cigarettes a day  Vaping Use   Vaping status: Never Used  Substance and Sexual Activity   Alcohol use: Not Currently   Drug use: Not Currently   Sexual activity: Not Currently  Other Topics Concern   Not on file  Social History Narrative   Not on file   Social Drivers of Health   Financial Resource Strain: Low Risk  (08/25/2024)   Received from [facility]   Overall Financial Resource  Strain (CARDIA)    Difficulty of Paying Living Expenses: Not very hard  Food Insecurity: No Food Insecurity (08/25/2024)   Received from [facility]   Hunger Vital Sign    Within the past 12 months, you worried that your food would run out before you got the money to buy more.: Never true    Within the past 12 months, the food you bought just didn't last and you didn't have money to get more.: Never true  Transportation Needs: No Transportation Needs (08/25/2024)   Received from [facility] - Transportation    In the past 12 months, has lack of transportation kept you from medical appointments or from getting medications?: No    Lack of Transportation (Non-Medical): No  Physical Activity: Not on file  Stress: Not on file  Social Connections: Patient Declined (07/27/2024)   Social Connection and Isolation Panel    Frequency of Communication with Friends and Family: Patient declined    Frequency of Social Gatherings with Friends and Family: Patient declined    Attends Religious Services: Patient declined    Database administrator or Organizations: Patient declined    Attends Banker Meetings: Patient declined    Marital Status: Patient declined  Recent Concern: Social Connections - Socially Isolated (05/18/2024)   Social Connection and Isolation Panel    Frequency of Communication with Friends and Family: More than three times a week    Frequency of Social Gatherings with Friends and Family: Twice a week    Attends Religious Services: Never    Database administrator or Organizations: No    Attends Banker Meetings: Never    Marital Status: Widowed    Review of Systems: A 12 point ROS discussed and pertinent positives are indicated in the HPI above.  All other systems are negative.  Review of Systems  Vital Signs: There were no vitals taken for this visit.  Physical Exam Constitutional:      General: She is not  in acute distress.    Appearance: Normal appearance.     Interventions: Nasal cannula in place.  HENT:     Head: Normocephalic and atraumatic.  Eyes:     General: No scleral icterus. Cardiovascular:     Rate and Rhythm: Normal rate.     Pulses: Normal pulses.  Abdominal:     Tenderness: There is no abdominal tenderness. There is no guarding.  Musculoskeletal:       Back:     Comments: TTP along right SI joint  Skin:    General: Skin is warm and dry.  Neurological:     Mental Status: She is  alert and oriented to person, place, and time.  Psychiatric:        Mood and Affect: Mood normal.        Behavior: Behavior normal.       Labs:  CBC: Recent Labs    07/27/24 1635 07/28/24 0404 07/30/24 1259 07/31/24 0526  WBC 16.0* 26.3* 13.2* 9.3  HGB 9.6* 9.0* 9.9* 8.9*  HCT 31.3* 29.6* 33.0* 29.1*  PLT 474* 419* 405* 415*    COAGS: Recent Labs    12/16/23 1140 04/22/24 2242 04/24/24 0443 07/27/24 1635  INR 1.0 1.2 1.3* 1.2  APTT  --   --  30  --     BMP: Recent Labs    07/28/24 1348 07/28/24 2126 07/30/24 1259 07/31/24 0526 08/01/24 0523  NA 133* 132* 133* 135  --   K 3.9 3.8 3.7 3.4* 3.4*  CL 98 100 99 102  --   CO2 23 23 23 25   --   GLUCOSE 132* 121* 110* 99  --   BUN 13 18 13 13   --   CALCIUM  9.4 9.4 9.6 9.4  --   CREATININE 0.94 1.07* 0.80 0.80  --   GFRNONAA >60 53* >60 >60  --     LIVER FUNCTION TESTS: Recent Labs    03/27/24 1410 04/23/24 0208 05/17/24 1935 07/27/24 1635  BILITOT 0.9 0.5 0.7 1.0  AST 35 35 42* 32  ALT 25 26 35 21  ALKPHOS 89 95 67 99  PROT 7.5 7.5 6.8 7.2  ALBUMIN 4.0 4.1 3.4* 3.7    TUMOR MARKERS: No results for input(s): AFPTM, CEA, CA199, CHROMGRNA in the last 8760 hours.  Assessment:  83 y/o F with history of bilateral sacral fracture s/p technically successful bilateral sacroplasty in IR on 06/29/24 (Dr. Karalee) who presents today to discuss ongoing right sided back pain post procedure.   Her  pain now seems localized to the right SI joint consistent with sacroiliac joint dysfunction.   1.) Please schedule for RIGHT SI joint injection of anesthetic and steroid ASAP.    Electronically Signed: Clotilda DELENA Hesselbach PA-C 08/31/2024, 9:44 AM   I spent a total of 25 Minutes in face to face in clinical consultation, greater than 50% of which was counseling/coordinating care for back pain.

## 2024-09-01 ENCOUNTER — Other Ambulatory Visit: Payer: Self-pay | Admitting: Interventional Radiology

## 2024-09-01 DIAGNOSIS — M533 Sacrococcygeal disorders, not elsewhere classified: Secondary | ICD-10-CM

## 2024-09-10 NOTE — Telephone Encounter (Signed)
 Called pt daughter, requested she return call to the office with patient's BP readings and those specifically 1.5 - 2 hrs after taking her BP medications

## 2024-09-10 NOTE — Telephone Encounter (Signed)
 Called patient's daughter again to check on dosing of IMDUR  for her mother - daughter states that patient will only take the 30 mg of IMDUR  in the mornings and declines second dose  Please advise

## 2024-09-16 ENCOUNTER — Ambulatory Visit
Admission: RE | Admit: 2024-09-16 | Discharge: 2024-09-16 | Disposition: A | Discharge status: Home / Self Care | Source: Ambulatory Visit | Attending: Interventional Radiology | Admitting: Interventional Radiology

## 2024-09-16 DIAGNOSIS — M533 Sacrococcygeal disorders, not elsewhere classified: Secondary | ICD-10-CM

## 2024-09-16 MED ORDER — IOPAMIDOL (ISOVUE-200) INJECTION 41%
3.0000 mL | Freq: Once | INTRAVENOUS | Status: AC | PRN
  Administered 2024-09-16: 3 mL

## 2024-09-16 MED ORDER — METHYLPREDNISOLONE ACETATE 40 MG/ML INJ SUSP (RADIOLOG
80.0000 mg | Freq: Once | INTRAMUSCULAR | Status: AC
  Administered 2024-09-16: 80 mg via EPIDURAL

## 2024-09-16 NOTE — Discharge Instructions (Signed)

## 2024-09-20 ENCOUNTER — Telehealth: Payer: Self-pay | Admitting: Family

## 2024-09-20 NOTE — Progress Notes (Unsigned)
 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   Pertinent cardiac history: LHC in 09/2020 noted mild nonobstructive CAD. Echo 09/2020 noted LVEF 55-60%. Echo 12/2021 noted LVEF of 65-70% with grade II diastolic dysfunction and moderate AS. Low risk stress test 10/2022. Echo 09/2023 noted LVEF 65-70%, grade II diastolic dysfunction, mild MR, and severe aortic stenosis. LHC 10/2023 noted mild nonobstructive CAD with preserved cardiac output and PAPI. Underwent TAVR 12/2023. Most recent echo 01/2024 noted LVEF of 60-65%, grade II diastolic dysfunction, mild MR, and mild to moderate AR though to be paravalvular.  Admitted 04/23/24 with sharp anterior chest pain on and off present at rest without radiation exacerbating or relieving factor. This was associated with nausea and some sweating/diaphoresis per patient. Patient felt flushed. Patient also reports associated sensation of panic/anxiety with associated sensation of shortness of breath present at rest. Patient denies any new cough or expectoration although she has had a mild cough last several days. s/p CT chest abdomen pelvis, troponin testing which are not remarkable. Found to have sodium of 120. Diuretics held and IVF given with improvement of sodium to 132.   Admitted 05/18/24 due to worsening shortness of breath and pedal edema. On admission, BNP was 140.1, HS-troponin was 14, ferritin is 5, TSAT is 5. Chest x-ray noted cardiomegaly with central congestion. IV diuresed. Hemoglobin stable. Potassium replenished.   Seen in [facility]  06/25 where farxgia 10mg  daily was started.   ED visit 07/27/24 for COPD exacerbation and was diagnosed with pneumonia and blood infection. WBC count 16, lactic acid 1.1, and Troponin elevated from 40 to 47. CXR showed cardiomegaly with interstitial scarring.  EKG showed sinus rhythm with anteroseptal infarction and Qtc at 457. Received steroids, nebulizer,and IV antibiotics. Pt discharge home on oral antibiotics. Prednisone  was recommended to be tapered.   She presents today, with her daughter, for a HF follow-up visit with a chief complaint of shortness of breath. Has associated fatigue, dizziness, pedal edema, back pain and occasional chest pain. Tolerating farxiga  without known side effects. Drinking 64 ounces of fluid daily and has a large cup that she measures out of.   Not adding salt and is now reading food labels for sodium intake. Daughter does her medication box and has been trying to get low sodium foods for her to eat.    ROS: All systems negative except what is listed in HPI, PMH and Problem List   Past Medical History:  Diagnosis Date   A-fib (HCC)    CHF (congestive heart failure) (HCC)    COPD (chronic obstructive pulmonary disease) (HCC)    GERD (gastroesophageal reflux disease)    Hypertension    Hypothyroidism    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   s/p TAVR with a 23 mm Edwards S3UR via TF approach by Dr. Wendel and Dr. Maryjane   Thyroid  disease     Current Outpatient Medications  Medication Sig Dispense Refill   ALPRAZolam  (XANAX ) 0.25 MG tablet Take 0.25 mg by mouth at bedtime as needed for sleep.     amiodarone  (PACERONE ) 200 MG tablet Take 1 tablet (200  mg total) by mouth daily.     apixaban  (ELIQUIS ) 5 MG TABS tablet Take 5 mg by mouth 2 (two) times daily.     atorvastatin  (LIPITOR) 40 MG tablet Take 1 tablet (40 mg total) by mouth daily. 30 tablet 0   cholecalciferol  (VITAMIN D3) 25 MCG (1000 UNIT) tablet Take 1,000 Units by mouth  daily.     dapagliflozin  propanediol (FARXIGA ) 10 MG TABS tablet Take 1 tablet (10 mg total) by mouth daily before breakfast. 30 tablet 11   furosemide  (LASIX ) 20 MG tablet Take 10 mg by mouth daily.     gabapentin  (NEURONTIN ) 100 MG capsule Take 100-200 mg by mouth 2 (two) times daily. (Patient not taking: Reported on 08/13/2024)     Ipratropium-Albuterol  (COMBIVENT  RESPIMAT) 20-100 MCG/ACT AERS respimat Inhale 1 puff into the lungs every 6 (six) hours as needed for wheezing.     isosorbide  mononitrate (IMDUR ) 60 MG 24 hr tablet Take 1 tablet (60 mg total) by mouth daily. 90 tablet 1   levalbuterol  (XOPENEX ) 1.25 MG/3ML nebulizer solution Take 1.25 mg by nebulization every 6 (six) hours as needed.     lidocaine  (LIDODERM ) 5 % Place 1 patch onto the skin daily. (Patient not taking: Reported on 08/13/2024)     mometasone -formoterol  (DULERA ) 200-5 MCG/ACT AERO Inhale 2 puffs into the lungs 2 (two) times daily. 1 each 6   montelukast  (SINGULAIR ) 10 MG tablet Take 1 tablet by mouth at bedtime.     potassium chloride  SA (KLOR-CON  M) 20 MEQ tablet Take 2 tablets (40 mEq total) by mouth daily. 60 tablet    predniSONE  (DELTASONE ) 5 MG tablet Take 5 mg by mouth daily with breakfast.     rOPINIRole  (REQUIP ) 0.25 MG tablet Take 1 tablet (0.25 mg total) by mouth at bedtime. 30 tablet 0   spironolactone  (ALDACTONE ) 25 MG tablet Take 1 tablet (25 mg total) by mouth daily.     sucralfate  (CARAFATE ) 1 g tablet Take 1 g by mouth 4 (four) times daily -  with meals and at bedtime.     vitamin B-12 (CYANOCOBALAMIN ) 1000 MCG tablet Take 1,000 mcg by mouth daily.     No current facility-administered medications for this visit.    Allergies  Allergen Reactions   Naproxen Sodium Anaphylaxis      Social History   Socioeconomic History   Marital status: Widowed    Spouse name: Not on file   Number of children: Not on file   Years of education: Not on file   Highest education level: Not on file  Occupational  History   Not on file  Tobacco Use   Smoking status: Former    Current packs/day: 0.50    Average packs/day: 0.5 packs/day for 12.0 years (6.0 ttl pk-yrs)    Types: Cigarettes   Smokeless tobacco: Never   Tobacco comments:    7-8 cigarettes a day  Vaping Use   Vaping status: Never Used  Substance and Sexual Activity   Alcohol use: Not Currently   Drug use: Not Currently   Sexual activity: Not Currently  Other Topics Concern   Not on file  Social History Narrative   Not on file   Social Drivers of Health   Financial Resource Strain: Low Risk  (08/25/2024)   Received from [facility]   Overall Financial Resource Strain (CARDIA)    Difficulty of Paying Living Expenses: Not very hard  Food Insecurity: No Food Insecurity (08/25/2024)   Received from [facility]   Hunger Vital Sign    Within the past 12 months, you worried that your food would run out before you got the money to buy more.: Never true    Within the past 12 months, the food you bought just didn't last and you didn't have money to get more.: Never true  Transportation Needs: No Transportation Needs (08/25/2024)   Received from [facility]    In the past 12 months, has lack of transportation kept you from medical appointments or from getting medications?: No    Lack of Transportation (Non-Medical): No  Physical Activity: Not on file  Stress: Not on file  Social Connections: Patient Declined (07/27/2024)   Social Connection and Isolation Panel    Frequency of Communication with Friends and Family: Patient declined    Frequency of Social Gatherings with Friends and Family: Patient declined    Attends Religious Services: Patient declined    Database administrator or Organizations: Patient declined    Attends Banker Meetings: Patient declined    Marital Status: Patient declined  Recent Concern: Social Connections - Socially  Isolated (05/18/2024)   Social Connection and Isolation Panel    Frequency of Communication with Friends and Family: More than three times a week    Frequency of Social Gatherings with Friends and Family: Twice a week    Attends Religious Services: Never    Database administrator or Organizations: No    Attends Banker Meetings: Never    Marital Status: Widowed  Intimate Partner Violence: Not At Risk (07/27/2024)   Humiliation, Afraid, Rape, and Kick questionnaire    Fear of Current or Ex-Partner: No    Emotionally Abused: No    Physically Abused: No    Sexually Abused: No      Family History  Problem Relation Age of Onset   Breast cancer Neg Hx    There were no vitals filed for this visit.  Wt Readings from Last 3 Encounters:  08/13/24 79 kg  08/01/24 81.2 kg  06/15/24 82.1 kg   Lab Results  Component Value Date   CREATININE 0.80 07/31/2024   CREATININE 0.80 07/30/2024   CREATININE 1.07 (H) 07/28/2024    PHYSICAL EXAM:  General: Well appearing female in wheelchair. No resp difficulty HEENT: normal Neck: supple, no JVD Cor: Regular rhythm, rate. No rubs, gallops. II/VI murmur at RUSB Lungs: clear Abdomen: soft, nontender, nondistended. Extremities: no cyanosis, clubbing, rash, 1+ pitting edema bilateral lower legs Neuro: alert & oriented X 3. Moves all 4 extremities w/o difficulty. Affect pleasant   ECG: not done   ASSESSMENT & PLAN:  1: NICM with preserved ejection fraction- - etiology likely valvular disease as cath showed nonobstructive CAD - NYHA class III - euvolemic - weighing daily - weight 180 at visit 3 months ago.  - weight up 1 pound from last visit here 3 weeks ago - Echo 12/2021: EF of 65-70% with grade II diastolic dysfunction and moderate AS - Echo 09/2023: EF 65-70%, grade II diastolic dysfunction, mild MR, and severe aortic stenosis. - Echo 01/2024: EF of 60-65%, grade II diastolic dysfunction, mild MR, and mild to moderate AR  thought to be paravalvular. - continue farxiga  10mg  daily; BMET today - continue furosemide  20mg  daily/ potassium 20meq daily - continue spironolactone  12.5mg  daily - get compression socks and wear daily with removal at bedtime - BNP 07/27/24 was 73.1  2: HTN- - BP 167/66;  may increase furosemide  or spironolactone  after getting BMET results back - saw PCP Loree) 07/25 - BMET 07/31/34 reviewed: sodium 135, potassium 3.4, creatinine 0.80, & GFR >60 - BMET today  3: PAF- - continue amiodarone  200mg  daily - continue apixaban  5mg  BID - saw cardiology (Agbor-Etang) 04/25 - TSH 08/17/24 was 7.742 from 4.185 on 04/23/24  4: Nonobstructive CAD- - LHC in 09/2020 noted mild nonobstructive CAD - LHC 10/2023 noted mild nonobstructive CAD with preserved cardiac output and PAPI.  5: Severe AS- - TAVR 01/25 - saw cardiology Dene) 09/25  6: Severe COPD- - saw pulm Alica (09/25)  7: Hyperlipidemia- - continue atorvastatin  40mg  daily - LDL 05/26/24 was 83   Return in 3 months, sooner if needed.   Linda Blumenthal, RN 09/20/24

## 2024-09-20 NOTE — Telephone Encounter (Signed)
 Called to confirm/remind patient of their appointment at the [facility] on 09/21/24.   Appointment:   [x] Confirmed  [] Left mess   [] No answer/No voice mail  [] VM Full/unable to leave message  [] Phone not in service  Patient reminded to bring all medications and/or complete list.  Confirmed patient has transportation. Gave directions, instructed to utilize valet parking.

## 2024-09-21 ENCOUNTER — Other Ambulatory Visit: Payer: Self-pay

## 2024-09-21 ENCOUNTER — Encounter: Payer: Self-pay | Admitting: Family

## 2024-09-21 ENCOUNTER — Ambulatory Visit: Attending: Family | Admitting: Family

## 2024-09-21 VITALS — BP 148/62 | HR 64 | Ht 63.0 in | Wt 167.8 lb

## 2024-09-21 DIAGNOSIS — E785 Hyperlipidemia, unspecified: Secondary | ICD-10-CM | POA: Insufficient documentation

## 2024-09-21 DIAGNOSIS — E782 Mixed hyperlipidemia: Secondary | ICD-10-CM

## 2024-09-21 DIAGNOSIS — Z952 Presence of prosthetic heart valve: Secondary | ICD-10-CM

## 2024-09-21 DIAGNOSIS — I2510 Atherosclerotic heart disease of native coronary artery without angina pectoris: Secondary | ICD-10-CM | POA: Diagnosis not present

## 2024-09-21 DIAGNOSIS — I428 Other cardiomyopathies: Secondary | ICD-10-CM | POA: Insufficient documentation

## 2024-09-21 DIAGNOSIS — I480 Paroxysmal atrial fibrillation: Secondary | ICD-10-CM | POA: Diagnosis not present

## 2024-09-21 DIAGNOSIS — I5032 Chronic diastolic (congestive) heart failure: Secondary | ICD-10-CM | POA: Diagnosis present

## 2024-09-21 DIAGNOSIS — Z79899 Other long term (current) drug therapy: Secondary | ICD-10-CM | POA: Diagnosis not present

## 2024-09-21 DIAGNOSIS — Z87891 Personal history of nicotine dependence: Secondary | ICD-10-CM | POA: Insufficient documentation

## 2024-09-21 DIAGNOSIS — I110 Hypertensive heart disease with heart failure: Secondary | ICD-10-CM | POA: Insufficient documentation

## 2024-09-21 DIAGNOSIS — M549 Dorsalgia, unspecified: Secondary | ICD-10-CM | POA: Diagnosis not present

## 2024-09-21 DIAGNOSIS — E039 Hypothyroidism, unspecified: Secondary | ICD-10-CM | POA: Diagnosis not present

## 2024-09-21 DIAGNOSIS — J449 Chronic obstructive pulmonary disease, unspecified: Secondary | ICD-10-CM | POA: Diagnosis not present

## 2024-09-21 DIAGNOSIS — Z7901 Long term (current) use of anticoagulants: Secondary | ICD-10-CM | POA: Diagnosis not present

## 2024-09-21 DIAGNOSIS — J9611 Chronic respiratory failure with hypoxia: Secondary | ICD-10-CM | POA: Diagnosis not present

## 2024-09-21 DIAGNOSIS — I10 Essential (primary) hypertension: Secondary | ICD-10-CM | POA: Diagnosis not present

## 2024-09-21 DIAGNOSIS — I350 Nonrheumatic aortic (valve) stenosis: Secondary | ICD-10-CM

## 2024-09-21 MED ORDER — VALSARTAN 40 MG PO TABS: 20.0000 mg | ORAL_TABLET | Freq: Every day | ORAL | 1 refills | Status: AC

## 2024-09-21 NOTE — Patient Instructions (Signed)
 Medication Changes:  START Valsartan 20mg  (1/2 tab) daily  Lab Work:  Go downstairs to [place] on LOWER LEVEL to have your blood work completed.  We will only call you if the results are abnormal or if the provider would like to make medication changes.  No news is good news.   Follow-Up in: Please follow up with the [facility] in 1 month with Ellouise Class, FNP.   Thank you for choosing [facility].    At the [facility], you and your health needs are our priority. We have a designated team specialized in the treatment of Heart Failure. This Care Team includes your primary Heart Failure Specialized Cardiologist (physician), Advanced Practice Providers (APPs- Physician Assistants and Nurse Practitioners), and Pharmacist who all work together to provide you with the care you need, when you need it.   You may see any of the following providers on your designated Care Team at your next follow up:  Dr. Toribio Fuel Dr. Ezra Shuck Dr. Ria Commander Dr. Morene Brownie Ellouise Class, FNP Jaun Bash, RPH-CPP  Please be sure to bring in all your medications bottles to every appointment.   Need to Contact Us :  If you have any questions or concerns before your next appointment please send us  a message through [facility] or call our office at [phone].    TO LEAVE A MESSAGE FOR THE NURSE SELECT OPTION 2, PLEASE LEAVE A MESSAGE INCLUDING: YOUR NAME DATE OF BIRTH CALL BACK NUMBER REASON FOR CALL**this is important as we prioritize the call backs  YOU WILL RECEIVE A CALL BACK THE SAME DAY AS LONG AS YOU CALL BEFORE 4:00 PM

## 2024-09-22 ENCOUNTER — Ambulatory Visit: Payer: Self-pay | Admitting: Family

## 2024-09-22 LAB — BASIC METABOLIC PANEL WITH GFR
BUN/Creatinine Ratio: 9 — ABNORMAL LOW (ref 12–28)
BUN: 9 mg/dL (ref 8–27)
CO2: 25 mmol/L (ref 20–29)
Calcium: 10.2 mg/dL (ref 8.7–10.3)
Chloride: 93 mmol/L — ABNORMAL LOW (ref 96–106)
Creatinine, Ser: 0.99 mg/dL (ref 0.57–1.00)
Glucose: 94 mg/dL (ref 70–99)
Potassium: 3.8 mmol/L (ref 3.5–5.2)
Sodium: 135 mmol/L (ref 134–144)
eGFR: 58 mL/min/1.73 — ABNORMAL LOW (ref >59)

## 2024-09-27 ENCOUNTER — Other Ambulatory Visit

## 2024-10-05 ENCOUNTER — Ambulatory Visit: Admitting: Physician Assistant

## 2024-10-05 ENCOUNTER — Ambulatory Visit (HOSPITAL_COMMUNITY)
Admission: RE | Admit: 2024-10-05 | Discharge: 2024-10-05 | Disposition: A | Discharge status: Home / Self Care | Source: Ambulatory Visit | Attending: Vascular Surgery | Admitting: Vascular Surgery

## 2024-10-05 ENCOUNTER — Encounter: Payer: Self-pay | Admitting: Physician Assistant

## 2024-10-05 VITALS — BP 158/82 | HR 65 | Temp 97.7°F | Wt 165.8 lb

## 2024-10-05 DIAGNOSIS — I7776 Dissection of artery of upper extremity: Secondary | ICD-10-CM

## 2024-10-05 DIAGNOSIS — Z9889 Other specified postprocedural states: Secondary | ICD-10-CM | POA: Insufficient documentation

## 2024-10-05 NOTE — Progress Notes (Signed)
 HISTORY AND PHYSICAL     CC:  follow up Requesting Provider:  Valora Lynwood FALCON, MD  HPI: Linda Brady is a 83 y.o. (19-Nov-1941) female who has hx of endovascular repair of right axillary artery psa on 10/24/2023 by Dr. Magda.  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   She underwent TAVR on 12/17/2023 by Dr. Maryjane.   Pt was last seen on 11/25/2023, the numbness in the arm improved and she was motor and sensory in tact.  Her arm was much improved.    She comes in today for follow up and here with her daughter.  She states that she still has some pain/tingling down her right arm.  She denies any pain in the right hand.  She inquires if she can have blood pressure in right arm.  She is compliant with her eliquis  and statin.  She is on home O2.  She has hx of PAF.    The pt is on a statin for cholesterol management.  The pt is not on a daily aspirin .   Other AC:  Eliquis  The pt is on ARB, diuretic for hypertension.   The pt not diabetic.   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 Wendel and Dr. Maryjane   Thyroid  disease     Past Surgical History:  Procedure Laterality Date   BREAST CYST ASPIRATION Left 10 plus yrs   benign-twice   CATARACT EXTRACTION W/PHACO Left 03/20/2023   Procedure: CATARACT EXTRACTION PHACO AND INTRAOCULAR LENS PLACEMENT (IOC) LEFT VISION BLUE;  Surgeon: Enola Feliciano Hugger, MD;  [facility];  Service: Ophthalmology;  Laterality: Left;  21.02   01:38.2   INTRAOPERATIVE TRANSTHORACIC ECHOCARDIOGRAM N/A 12/16/2023   Procedure: INTRAOPERATIVE TRANSTHORACIC ECHOCARDIOGRAM;   Surgeon: Wendel Lurena POUR, MD;  Location: [facility];  Service: Cardiovascular;  Laterality: N/A;   IR RADIOLOGIST EVAL & MGMT  06/22/2024   IR RADIOLOGIST EVAL & MGMT  08/31/2024   IR VERTEBROPLASTY LUMBAR BX INC UNI/BIL INC/INJECT/IMAGING  06/29/2024   LEFT HEART CATH AND CORONARY ANGIOGRAPHY N/A 09/11/2020   Procedure: LEFT HEART CATH AND CORONARY ANGIOGRAPHY;  Surgeon: Ammon Blunt, MD;  Location: [facility] INVASIVE CV LAB;  Service: Cardiovascular;  Laterality: N/A;   PERIPHERAL VASCULAR INTERVENTION Right 10/24/2023   Procedure: PERIPHERAL VASCULAR INTERVENTION;  Surgeon: Magda Debby SAILOR, MD;  Location: [facility];  Service: Cardiovascular;  Laterality: Right;  right axillary stent   RIGHT/LEFT HEART CATH AND CORONARY ANGIOGRAPHY N/A 10/23/2023   Procedure: RIGHT/LEFT HEART CATH AND CORONARY ANGIOGRAPHY;  Surgeon: Wendel Lurena POUR, MD;  Location: [facility];  Service: Cardiovascular;  Laterality: N/A;   UPPER EXTREMITY ANGIOGRAPHY Right 10/24/2023   Procedure: Upper Extremity Angiography;  Surgeon: Magda Debby SAILOR, MD;  Location: [facility];  Service: Cardiovascular;  Laterality: Right;    Social History   Socioeconomic History   Marital status: Widowed    Spouse name: Not on file   Number of children: Not on file   Years of education: Not on file   Highest education level: Not on file  Occupational History   Not on file  Tobacco Use   Smoking status: Former    Current packs/day: 0.50    Average packs/day: 0.5 packs/day for 12.0 years (6.0 ttl pk-yrs)    Types: Cigarettes   Smokeless tobacco: Never   Tobacco comments:    7-8 cigarettes a day  Vaping Use   Vaping status: Never Used  Substance and Sexual Activity   Alcohol use: Not Currently   Drug use: Not Currently   Sexual activity: Not Currently  Other Topics Concern   Not on file  Social History Narrative   Not on file   Social Drivers of Health   Financial Resource Strain: Low  Risk  (08/25/2024)   Received from [facility]   Overall Financial Resource Strain (CARDIA)    Difficulty of Paying Living Expenses: Not very hard  Food Insecurity: No Food Insecurity (08/25/2024)   Received from [facility]   Hunger Vital Sign    Within the past 12 months, you worried that your food would run out before you got the money to buy more.: Never true    Within the past 12 months, the food you bought just didn't last and you didn't have money to get more.: Never true  Transportation Needs: No Transportation Needs (08/25/2024)   Received from [facility] - Transportation    In the past 12 months, has lack of transportation kept you from medical appointments or from getting medications?: No    Lack of Transportation (Non-Medical): No  Physical Activity: Not on file  Stress: Not on file  Social Connections: Patient Declined (07/27/2024)   Social Connection and Isolation Panel    Frequency of Communication with Friends and Family: Patient declined    Frequency of Social Gatherings with Friends and Family: Patient declined    Attends Religious Services: Patient declined    Database Administrator or Organizations: Patient declined    Attends Banker Meetings: Patient declined    Marital Status: Patient declined  Recent Concern: Social Connections - Socially Isolated (05/18/2024)   Social Connection and Isolation Panel    Frequency of Communication with Friends and Family: More than three times a week    Frequency of Social Gatherings with Friends and Family: Twice a week    Attends Religious Services: Never    Database Administrator or Organizations: No    Attends Banker Meetings: Never    Marital Status: Widowed  Intimate Partner Violence: Not At Risk (07/27/2024)   Humiliation, Afraid, Rape, and Kick questionnaire    Fear of Current or Ex-Partner: No    Emotionally Abused: No    Physically  Abused: No    Sexually Abused: No     Family History  Problem Relation Age of Onset   Breast cancer Neg Hx     Current Outpatient Medications  Medication Sig Dispense Refill   ALPRAZolam  (XANAX ) 0.25 MG tablet Take 0.25 mg by mouth at bedtime as needed for sleep.     amiodarone  (PACERONE ) 200 MG tablet Take 1 tablet (200 mg total) by mouth daily.     apixaban  (ELIQUIS ) 5 MG TABS tablet Take 5 mg by mouth 2 (two) times daily.     atorvastatin  (LIPITOR) 40 MG tablet Take 1 tablet (40 mg total) by mouth daily. 30 tablet 0   cholecalciferol  (VITAMIN D3) 25 MCG (1000 UNIT) tablet Take 1,000 Units by mouth daily.  dapagliflozin  propanediol (FARXIGA ) 10 MG TABS tablet Take 1 tablet (10 mg total) by mouth daily before breakfast. 30 tablet 11   furosemide  (LASIX ) 20 MG tablet Take 10 mg by mouth daily.     Ipratropium-Albuterol  (COMBIVENT  RESPIMAT) 20-100 MCG/ACT AERS respimat Inhale 1 puff into the lungs every 6 (six) hours as needed for wheezing.     isosorbide  mononitrate (IMDUR ) 30 MG 24 hr tablet Take 30 mg by mouth daily.     levalbuterol  (XOPENEX ) 1.25 MG/3ML nebulizer solution Take 1.25 mg by nebulization every 6 (six) hours as needed.     levothyroxine  (SYNTHROID ) 75 MCG tablet Take 75 mcg by mouth daily before breakfast.     lidocaine  (LIDODERM ) 5 % Place 1 patch onto the skin daily. (Patient taking differently: Place 1 patch onto the skin daily as needed.)     mometasone -formoterol  (DULERA ) 200-5 MCG/ACT AERO Inhale 2 puffs into the lungs 2 (two) times daily. 1 each 6   montelukast  (SINGULAIR ) 10 MG tablet Take 1 tablet by mouth at bedtime.     pantoprazole  (PROTONIX ) 40 MG tablet Take 40 mg by mouth daily.     potassium chloride  SA (KLOR-CON  M) 20 MEQ tablet Take 2 tablets (40 mEq total) by mouth daily. 60 tablet    rOPINIRole  (REQUIP ) 0.25 MG tablet Take 1 tablet (0.25 mg total) by mouth at bedtime. 30 tablet 0   spironolactone  (ALDACTONE ) 25 MG tablet Take 1 tablet (25 mg total)  by mouth daily.     sucralfate  (CARAFATE ) 1 g tablet Take 1 g by mouth 4 (four) times daily -  with meals and at bedtime.     valsartan (DIOVAN) 40 MG tablet Take 0.5 tablets (20 mg total) by mouth daily. 45 tablet 1   vitamin B-12 (CYANOCOBALAMIN ) 1000 MCG tablet Take 1,000 mcg by mouth daily.     No current facility-administered medications for this visit.    Allergies  Allergen Reactions   Naproxen Sodium Anaphylaxis     REVIEW OF SYSTEMS:   [X]  denotes positive finding, [ ]  denotes negative finding Cardiac  Comments:  Chest pain or chest pressure:    Shortness of breath upon exertion:    Short of breath when lying flat:    Irregular heart rhythm:        Vascular    Pain in calf, thigh, or hip brought on by ambulation:    Pain in feet at night that wakes you up from your sleep:     Blood clot in your veins:    Leg swelling:         Pulmonary    Oxygen at home:    Productive cough:     Wheezing:         Neurologic    Sudden weakness in arms or legs:     Sudden numbness in arms or legs:     Sudden onset of difficulty speaking or slurred speech:    Temporary loss of vision in one eye:     Problems with dizziness:         Gastrointestinal    Blood in stool:     Vomited blood:         Genitourinary    Burning when urinating:     Blood in urine:        Psychiatric    Major depression:         Hematologic    Bleeding problems:    Problems with blood clotting too easily:  Skin    Rashes or ulcers:        Constitutional    Fever or chills:      PHYSICAL EXAMINATION:  Today's Vitals   10/05/24 1245  BP: (!) 158/82  Pulse: 65  Temp: 97.7 F (36.5 C)  TempSrc: Temporal  Weight: 165 lb 12.8 oz (75.2 kg)  PainSc: 10-Worst pain ever  PainLoc: Arm   Body mass index is 29.37 kg/m.   General:  WDWN in NAD; vital signs documented above Gait: Not observed HENT: WNL, normocephalic Pulmonary: normal non-labored breathing Cardiac: regular HR,  without carotid bruits Skin: without rashes Vascular Exam/Pulses:  Right Left  Radial 2+ (normal) 2+ (normal)  AT 1+ (weak) 1+ (weak)   Extremities: without open wounds;  Musculoskeletal: no muscle wasting or atrophy  Neurologic: A&O X 3 Psychiatric:  The pt has Normal affect.   Non-Invasive Vascular Imaging on 10/05/2024:   +---------------+----------+---------+--------+--------+  Site          PSV (cm/s)Waveform StenosisComments  +---------------+----------+---------+--------+--------+  Subclavian Prox155       triphasic                  +---------------+----------+---------+--------+--------+  Subclavian Mid 105       triphasic                  +---------------+----------+---------+--------+--------+  Subclavian Dist66        triphasic                  +---------------+----------+---------+--------+--------+  Axillary      69        triphasic                  +---------------+----------+---------+--------+--------+  Brachial Prox  232       triphasic        Tortuous  +---------------+----------+---------+--------+--------+  Brachial Mid   160       triphasic                  +---------------+----------+---------+--------+--------+  Brachial Dist  114       triphasic                  +---------------+----------+---------+--------+--------+  Radial Dist    88        triphasic                  +---------------+----------+---------+--------+--------+  Ulnar Dist     33        triphasic                  +---------------+----------+---------+--------+--------+   Summary:    Right: No obstruction visualized in the right upper extremity No signs of brachial dissection or any other arterial disease seen in right upper extremity.     ASSESSMENT/PLAN:: 83 y.o. female here for follow up for history of endovascular repair of right axillary artery psa on 10/24/2023 by Dr. Magda.  She developed right axillary pseudoaneurysm  after cardiac catheterization.    -pt with palpable right radial pulse.  Her motor and sensation are in tact in the right hand.   -she does continue to have some tingling and discomfort in the right arm, most likely due to nerve irritation from the psa.  Discussed that at the year mark will determine what residual she will have.   -discussed if she can, to continue to have her blood pressure taken in the left arm given the stent is down into the right arm.   -continue  statin and Eliquis .  -f/u in one year with RUE arterial duplex.  She will call sooner if any issues before then.     Lucie Apt, [facility] [phone]  Clinic MD:   Magda

## 2024-10-19 ENCOUNTER — Telehealth: Payer: Self-pay | Admitting: Family

## 2024-10-19 NOTE — Progress Notes (Unsigned)
 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   Pertinent cardiac history: LHC in 09/2020 noted mild nonobstructive CAD. Echo 09/2020 noted LVEF 55-60%. Echo 12/2021 noted LVEF of 65-70% with grade II diastolic dysfunction and moderate AS. Low risk stress test 10/2022. Echo 09/2023 noted LVEF 65-70%, grade II diastolic dysfunction, mild MR, and severe aortic stenosis. LHC 10/2023 noted mild nonobstructive CAD with preserved cardiac output and PAPI. Underwent TAVR 12/2023. Most recent echo 01/2024 noted LVEF of 60-65%, grade II diastolic dysfunction, mild MR, and mild to moderate AR though to be paravalvular.  Admitted 04/23/24 with sharp anterior chest pain on and off present at rest without radiation exacerbating or relieving factor. This was associated with nausea and some sweating/diaphoresis per patient. Patient felt flushed. Patient also reports associated sensation of panic/anxiety with associated sensation of shortness of breath present at rest. Patient denies any new cough or expectoration although she has had a mild cough last several days. s/p CT chest abdomen pelvis, troponin testing which are not remarkable. Found to have sodium of 120. Diuretics held and IVF given with improvement of sodium to 132.   Admitted 05/18/24 due to worsening shortness of breath and pedal edema. On admission, BNP was 140.1, HS-troponin was 14, ferritin is 5, TSAT is 5. Chest x-ray noted cardiomegaly with central congestion. IV diuresed. Hemoglobin stable. Potassium replenished.   Seen in [facility]  06/25 where farxgia 10mg  daily was started.   ED visit 07/27/24 for COPD exacerbation and was diagnosed with pneumonia and blood infection. WBC count 16, lactic acid 1.1, and Troponin elevated from 40 to 47. CXR showed cardiomegaly with interstitial scarring.  EKG showed sinus rhythm with anteroseptal infarction and QTc at 457. Received steroids, nebulizer,and IV antibiotics. Pt discharge home on oral antibiotics. Prednisone  was recommended to be tapered.   She presents today, with her daughter, for a HF follow-up visit with a chief complaint of shortness of breath primarily at night. She feels as though she needs more O2, currently maintained at 2L and sleeps with 2 pillows.  Denies fatigue, chest pain, shortness of breath, dizziness, cough, abdominal distention, edema.   Prednisone  has been discontinued however she is now receiving steroid injections for back pain. She is taking 30mg  of Imdur  instead of the recommended 60mg  due to palpitations at the higher dose. She continues to take farxiga , lasix , potassium, and spironolactone  as prescribed with no reported side effects.   Not adding salt to meals and has limited fluid intake. She is occasionally weighing and monitoring BP. Weight has decreased by 13 lbs since last visit.   Has upcoming appts with cardiology and pulmonology.    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 Wendel and Dr. Maryjane   Thyroid  disease     Current Outpatient Medications  Medication Sig Dispense Refill   ALPRAZolam  (XANAX ) 0.25 MG tablet Take 0.25 mg by mouth at bedtime as needed  for sleep.     amiodarone  (PACERONE ) 200 MG tablet Take 1 tablet (200 mg total) by mouth daily.     apixaban  (ELIQUIS ) 5 MG TABS tablet Take 5 mg by mouth 2 (two) times daily.     atorvastatin  (LIPITOR) 40 MG tablet Take 1 tablet (40 mg total) by mouth daily. 30 tablet 0   cholecalciferol  (VITAMIN D3) 25 MCG (1000 UNIT) tablet Take 1,000 Units by mouth daily.     dapagliflozin  propanediol (FARXIGA ) 10 MG TABS tablet Take 1 tablet (10 mg total) by mouth daily before breakfast. 30 tablet 11   furosemide  (LASIX ) 20 MG tablet Take 10 mg by mouth daily.     Ipratropium-Albuterol  (COMBIVENT  RESPIMAT) 20-100 MCG/ACT AERS respimat Inhale 1 puff into the lungs every 6 (six) hours as needed for wheezing.     isosorbide  mononitrate (IMDUR ) 30 MG 24 hr tablet Take 30 mg by mouth daily.     levalbuterol  (XOPENEX ) 1.25 MG/3ML nebulizer solution Take 1.25 mg by nebulization every 6 (six) hours as needed.     levothyroxine  (SYNTHROID ) 75 MCG tablet Take 75 mcg by mouth daily before breakfast.     lidocaine  (LIDODERM ) 5 % Place 1 patch onto the skin daily. (Patient taking differently: Place 1 patch onto the skin daily as needed.)     mometasone -formoterol  (DULERA ) 200-5 MCG/ACT AERO Inhale 2 puffs into the lungs 2 (two) times daily. 1 each 6   montelukast  (SINGULAIR ) 10 MG tablet Take 1 tablet by mouth at bedtime.     pantoprazole  (PROTONIX ) 40 MG tablet Take 40 mg by mouth daily.     potassium chloride  SA (KLOR-CON  M) 20 MEQ tablet Take 2 tablets (40 mEq total) by mouth daily. 60 tablet    rOPINIRole  (REQUIP ) 0.25 MG tablet Take 1 tablet (0.25 mg total) by mouth at bedtime. 30 tablet 0   spironolactone  (ALDACTONE ) 25 MG tablet Take 1 tablet (25 mg total) by mouth daily.     sucralfate  (CARAFATE ) 1 g tablet Take 1 g by mouth 4 (four) times daily -  with meals and at bedtime.     valsartan (DIOVAN) 40 MG tablet Take 0.5 tablets (20 mg total) by mouth daily. 45 tablet 1   vitamin B-12 (CYANOCOBALAMIN ) 1000 MCG  tablet Take 1,000 mcg by mouth daily.     No current facility-administered medications for this visit.    Allergies  Allergen Reactions   Naproxen Sodium Anaphylaxis      Social History   Socioeconomic History   Marital status: Widowed    Spouse name: Not on file   Number of children: Not on file   Years of education: Not on file   Highest education level: Not on file  Occupational History   Not on file  Tobacco Use   Smoking status: Former    Current packs/day: 0.50    Average packs/day: 0.5 packs/day for 12.0 years (6.0 ttl pk-yrs)    Types: Cigarettes   Smokeless tobacco: Never   Tobacco comments:    7-8 cigarettes a day  Vaping Use   Vaping status: Never Used  Substance and Sexual Activity   Alcohol use: Not Currently   Drug use: Not Currently   Sexual activity: Not Currently  Other Topics Concern   Not  on file  Social History Narrative   Not on file   Social Drivers of Health   Financial Resource Strain: Low Risk  (08/25/2024)   Received from [facility]   Overall Financial Resource Strain (CARDIA)    Difficulty of Paying Living Expenses: Not very hard  Food Insecurity: No Food Insecurity (08/25/2024)   Received from [facility]   Hunger Vital Sign    Within the past 12 months, you worried that your food would run out before you got the money to buy more.: Never true    Within the past 12 months, the food you bought just didn't last and you didn't have money to get more.: Never true  Transportation Needs: No Transportation Needs (08/25/2024)   Received from [facility] - Transportation    In the past 12 months, has lack of transportation kept you from medical appointments or from getting medications?: No    Lack of Transportation (Non-Medical): No  Physical Activity: Not on file  Stress: Not on file  Social Connections: Patient Declined (07/27/2024)   Social Connection and Isolation Panel     Frequency of Communication with Friends and Family: Patient declined    Frequency of Social Gatherings with Friends and Family: Patient declined    Attends Religious Services: Patient declined    Database Administrator or Organizations: Patient declined    Attends Banker Meetings: Patient declined    Marital Status: Patient declined  Recent Concern: Social Connections - Socially Isolated (05/18/2024)   Social Connection and Isolation Panel    Frequency of Communication with Friends and Family: More than three times a week    Frequency of Social Gatherings with Friends and Family: Twice a week    Attends Religious Services: Never    Database Administrator or Organizations: No    Attends Banker Meetings: Never    Marital Status: Widowed  Intimate Partner Violence: Not At Risk (07/27/2024)   Humiliation, Afraid, Rape, and Kick questionnaire    Fear of Current or Ex-Partner: No    Emotionally Abused: No    Physically Abused: No    Sexually Abused: No      Family History  Problem Relation Age of Onset   Breast cancer Neg Hx    There were no vitals filed for this visit.  Wt Readings from Last 3 Encounters:  10/05/24 165 lb 12.8 oz (75.2 kg)  09/21/24 167 lb 12.8 oz (76.1 kg)  08/13/24 174 lb 2 oz (79 kg)   Lab Results  Component Value Date   CREATININE 0.99 09/21/2024   CREATININE 0.80 07/31/2024   CREATININE 0.80 07/30/2024    PHYSICAL EXAM:  General: Well appearing female in wheelchair. No acute signs of distress.  HEENT: Normal.  Neck: Supple, no JVD Cor: Regular rhythm, rate. No rubs, gallops. II/VI murmur at RUSB Lungs: Clear bilaterally. Symmetrical chest expansion. 2L O2.  Abdomen: Soft, nontender, nondistended. Extremities: No cyanosis, clubbing, rash, 1+ pitting edema bilateral lower legs. Varicose veins more prominent to right leg.  Neuro: AO X 3. Affect pleasant   ECG: not done   ASSESSMENT & PLAN:  1: NICM with preserved  ejection fraction- - etiology likely valvular disease as cath showed nonobstructive CAD - NYHA Brady III - euvolemic - weighing occasionally, reinforced importance of weighing daily  - weight 180 at visit 3 months ago.  - weight down 13 pounds from last visit here 3  weeks ago - Echo 12/2021: EF of 65-70% with grade II diastolic dysfunction and moderate AS - Echo 09/2023: EF 65-70%, grade II diastolic dysfunction, mild MR, and severe aortic stenosis. - Echo 01/2024: EF of 60-65%, grade II diastolic dysfunction, mild MR, and mild to moderate AR thought to be paravalvular. - continue farxiga  10mg  daily - continue furosemide  10mg  daily/ potassium 40meq daily - continue spironolactone  25 mg daily - wearing compression socks daily with removal at bedtime - BNP 07/27/24 was 73.1 - BMET today   2: HTN- - BP 148/62  - start taking valsartan 20mg  daily - BP goal <140/90, reinforced importance of daily BP monitoring  - saw PCP Linda Brady) 09/25 - BMET 08/17/24 reviewed: sodium 137, potassium 3.7, creatinine 0., & GFR >60 - BMET today  3: PAF- - continue amiodarone  200mg  daily - continue apixaban  5mg  BID - saw cardiology Linda Brady) 09/25 - TSH 08/17/24 was 7.742 from 4.185 on 04/23/24  4: Nonobstructive CAD- - LHC in 09/2020 noted mild nonobstructive CAD - LHC 10/2023 noted mild nonobstructive CAD with preserved cardiac output and PAPI.  5: Severe AS- - TAVR 01/25 - saw cardiology Linda Brady) 09/25  6: Severe COPD- - saw pulm Linda Brady (09/25)  7: Hyperlipidemia- - continue atorvastatin  40mg  daily - LDL 05/26/24 was 83   Return in 1 month, sooner if needed.   Linda Class, FNP/ Linda Morris, FNP-S  09/21/24

## 2024-10-19 NOTE — Telephone Encounter (Signed)
 Called to confirm/remind patient of their appointment at the [facility] on 10/20/24.   Appointment:   [x] Confirmed  [] Left mess   [] No answer/No voice mail  [] VM Full/unable to leave message  [] Phone not in service  Patient reminded to bring all medications and/or complete list.  Confirmed patient has transportation. Gave directions, instructed to utilize valet parking.

## 2024-10-20 ENCOUNTER — Encounter: Payer: Self-pay | Admitting: Family

## 2024-10-20 ENCOUNTER — Ambulatory Visit: Attending: Family | Admitting: Family

## 2024-10-20 VITALS — BP 140/57 | HR 66 | Wt 163.2 lb

## 2024-10-20 DIAGNOSIS — R232 Flushing: Secondary | ICD-10-CM | POA: Diagnosis not present

## 2024-10-20 DIAGNOSIS — R61 Generalized hyperhidrosis: Secondary | ICD-10-CM | POA: Insufficient documentation

## 2024-10-20 DIAGNOSIS — E785 Hyperlipidemia, unspecified: Secondary | ICD-10-CM | POA: Insufficient documentation

## 2024-10-20 DIAGNOSIS — F419 Anxiety disorder, unspecified: Secondary | ICD-10-CM | POA: Diagnosis not present

## 2024-10-20 DIAGNOSIS — J9611 Chronic respiratory failure with hypoxia: Secondary | ICD-10-CM | POA: Diagnosis not present

## 2024-10-20 DIAGNOSIS — Z952 Presence of prosthetic heart valve: Secondary | ICD-10-CM | POA: Insufficient documentation

## 2024-10-20 DIAGNOSIS — I350 Nonrheumatic aortic (valve) stenosis: Secondary | ICD-10-CM

## 2024-10-20 DIAGNOSIS — I480 Paroxysmal atrial fibrillation: Secondary | ICD-10-CM

## 2024-10-20 DIAGNOSIS — I252 Old myocardial infarction: Secondary | ICD-10-CM | POA: Insufficient documentation

## 2024-10-20 DIAGNOSIS — J440 Chronic obstructive pulmonary disease with acute lower respiratory infection: Secondary | ICD-10-CM | POA: Diagnosis not present

## 2024-10-20 DIAGNOSIS — Z7901 Long term (current) use of anticoagulants: Secondary | ICD-10-CM | POA: Insufficient documentation

## 2024-10-20 DIAGNOSIS — M549 Dorsalgia, unspecified: Secondary | ICD-10-CM | POA: Diagnosis not present

## 2024-10-20 DIAGNOSIS — Z79890 Hormone replacement therapy (postmenopausal): Secondary | ICD-10-CM | POA: Insufficient documentation

## 2024-10-20 DIAGNOSIS — I110 Hypertensive heart disease with heart failure: Secondary | ICD-10-CM | POA: Diagnosis not present

## 2024-10-20 DIAGNOSIS — I5189 Other ill-defined heart diseases: Secondary | ICD-10-CM | POA: Insufficient documentation

## 2024-10-20 DIAGNOSIS — R110 Nausea: Secondary | ICD-10-CM | POA: Diagnosis not present

## 2024-10-20 DIAGNOSIS — R1031 Right lower quadrant pain: Secondary | ICD-10-CM | POA: Insufficient documentation

## 2024-10-20 DIAGNOSIS — E039 Hypothyroidism, unspecified: Secondary | ICD-10-CM | POA: Diagnosis not present

## 2024-10-20 DIAGNOSIS — R0789 Other chest pain: Secondary | ICD-10-CM | POA: Diagnosis not present

## 2024-10-20 DIAGNOSIS — I2510 Atherosclerotic heart disease of native coronary artery without angina pectoris: Secondary | ICD-10-CM | POA: Diagnosis not present

## 2024-10-20 DIAGNOSIS — Z87891 Personal history of nicotine dependence: Secondary | ICD-10-CM | POA: Diagnosis not present

## 2024-10-20 DIAGNOSIS — I10 Essential (primary) hypertension: Secondary | ICD-10-CM | POA: Diagnosis not present

## 2024-10-20 DIAGNOSIS — E782 Mixed hyperlipidemia: Secondary | ICD-10-CM

## 2024-10-20 DIAGNOSIS — Z7984 Long term (current) use of oral hypoglycemic drugs: Secondary | ICD-10-CM | POA: Insufficient documentation

## 2024-10-20 DIAGNOSIS — J449 Chronic obstructive pulmonary disease, unspecified: Secondary | ICD-10-CM

## 2024-10-20 DIAGNOSIS — G479 Sleep disorder, unspecified: Secondary | ICD-10-CM | POA: Insufficient documentation

## 2024-10-20 DIAGNOSIS — I5032 Chronic diastolic (congestive) heart failure: Secondary | ICD-10-CM | POA: Diagnosis not present

## 2024-10-20 DIAGNOSIS — Z79899 Other long term (current) drug therapy: Secondary | ICD-10-CM | POA: Diagnosis not present

## 2024-10-20 DIAGNOSIS — I428 Other cardiomyopathies: Secondary | ICD-10-CM | POA: Insufficient documentation

## 2024-10-20 DIAGNOSIS — Z7951 Long term (current) use of inhaled steroids: Secondary | ICD-10-CM | POA: Insufficient documentation

## 2024-10-20 DIAGNOSIS — Z9981 Dependence on supplemental oxygen: Secondary | ICD-10-CM | POA: Diagnosis not present

## 2024-10-20 NOTE — Patient Instructions (Signed)
 Medication Changes:  No medication changes today!  Lab Work:   Go downstairs to [facility] on [address] to have your blood work completed.  We will only call you if the results are abnormal or if the provider would like to make medication changes.  No news is good news.   Special Instructions // Education:  PLEASE BRING BLOOD PRESSURE LOG TO YOUR NEXT VISIT.  Follow-Up in: Please follow up with the [facility] Clinic in 2 months with Ellouise Class, FNP.   Thank you for choosing [facility].    At the [facility] Clinic, you and your health needs are our priority. We have a designated team specialized in the treatment of Heart Failure. This Care Team includes your primary Heart Failure Specialized Cardiologist (physician), Advanced Practice Providers (APPs- Physician Assistants and Nurse Practitioners), and Pharmacist who all work together to provide you with the care you need, when you need it.   You may see any of the following providers on your designated Care Team at your next follow up:  Dr. Toribio Fuel Dr. Ezra Shuck Dr. Ria Commander Dr. Morene Brownie Ellouise Class, FNP Jaun Bash, RPH-CPP  Please be sure to bring in all your medications bottles to every appointment.   Need to Contact Us :  If you have any questions or concerns before your next appointment please send us  a message through [place] or call our office at [phone].    TO LEAVE A MESSAGE FOR THE NURSE SELECT OPTION 2, PLEASE LEAVE A MESSAGE INCLUDING: YOUR NAME DATE OF BIRTH CALL BACK NUMBER REASON FOR CALL**this is important as we prioritize the call backs  YOU WILL RECEIVE A CALL BACK THE SAME DAY AS LONG AS YOU CALL BEFORE 4:00 PM

## 2024-10-21 ENCOUNTER — Ambulatory Visit: Payer: Self-pay | Admitting: Family

## 2024-10-21 LAB — BASIC METABOLIC PANEL WITH GFR
BUN/Creatinine Ratio: 10 — ABNORMAL LOW (ref 12–28)
BUN: 10 mg/dL (ref 8–27)
CO2: 24 mmol/L (ref 20–29)
Calcium: 9.7 mg/dL (ref 8.7–10.3)
Chloride: 94 mmol/L — ABNORMAL LOW (ref 96–106)
Creatinine, Ser: 1.02 mg/dL — ABNORMAL HIGH (ref 0.57–1.00)
Glucose: 99 mg/dL (ref 70–99)
Potassium: 4.4 mmol/L (ref 3.5–5.2)
Sodium: 131 mmol/L — ABNORMAL LOW (ref 134–144)
eGFR: 56 mL/min/1.73 — ABNORMAL LOW (ref >59)

## 2024-11-01 ENCOUNTER — Observation Stay
Admission: EM | Admit: 2024-11-01 | Discharge: 2024-11-03 | DRG: 190 | Disposition: A | Attending: Emergency Medicine | Admitting: Emergency Medicine

## 2024-11-01 ENCOUNTER — Encounter: Payer: Self-pay | Admitting: Emergency Medicine

## 2024-11-01 ENCOUNTER — Other Ambulatory Visit: Payer: Self-pay

## 2024-11-01 ENCOUNTER — Emergency Department

## 2024-11-01 DIAGNOSIS — J441 Chronic obstructive pulmonary disease with (acute) exacerbation: Principal | ICD-10-CM | POA: Diagnosis present

## 2024-11-01 DIAGNOSIS — I5032 Chronic diastolic (congestive) heart failure: Secondary | ICD-10-CM | POA: Diagnosis present

## 2024-11-01 DIAGNOSIS — J9611 Chronic respiratory failure with hypoxia: Secondary | ICD-10-CM

## 2024-11-01 LAB — BASIC METABOLIC PANEL WITH GFR
Anion gap: 13 (ref 5–15)
BUN: 10 mg/dL (ref 8–23)
CO2: 27 mmol/L (ref 22–32)
Calcium: 9.6 mg/dL (ref 8.9–10.3)
Chloride: 94 mmol/L — ABNORMAL LOW (ref 98–111)
Creatinine, Ser: 1 mg/dL (ref 0.44–1.00)
GFR, Estimated: 57 mL/min — ABNORMAL LOW (ref >60)
Glucose, Bld: 99 mg/dL (ref 70–99)
Potassium: 2.9 mmol/L — ABNORMAL LOW (ref 3.5–5.1)
Sodium: 134 mmol/L — ABNORMAL LOW (ref 135–145)

## 2024-11-01 LAB — CBC
HCT: 32.9 % — ABNORMAL LOW (ref 36.0–46.0)
Hemoglobin: 10.2 g/dL — ABNORMAL LOW (ref 12.0–15.0)
MCH: 23.9 pg — ABNORMAL LOW (ref 26.0–34.0)
MCHC: 31 g/dL (ref 30.0–36.0)
MCV: 77 fL — ABNORMAL LOW (ref 80.0–100.0)
Platelets: 371 K/uL (ref 150–400)
RBC: 4.27 MIL/uL (ref 3.87–5.11)
RDW: 18.8 % — ABNORMAL HIGH (ref 11.5–15.5)
WBC: 5.3 K/uL (ref 4.0–10.5)
nRBC: 0 % (ref 0.0–0.2)

## 2024-11-01 LAB — MAGNESIUM: Magnesium: 2 mg/dL (ref 1.7–2.4)

## 2024-11-01 LAB — RESP PANEL BY RT-PCR (RSV, FLU A&B, COVID)  RVPGX2
Influenza A by PCR: NEGATIVE
Influenza B by PCR: NEGATIVE
Resp Syncytial Virus by PCR: NEGATIVE
SARS Coronavirus 2 by RT PCR: NEGATIVE

## 2024-11-01 LAB — D-DIMER, QUANTITATIVE: D-Dimer, Quant: 0.61 ug{FEU}/mL — ABNORMAL HIGH (ref 0.00–0.50)

## 2024-11-01 LAB — TROPONIN T, HIGH SENSITIVITY
Troponin T High Sensitivity: 29 ng/L — ABNORMAL HIGH (ref 0–19)
Troponin T High Sensitivity: 29 ng/L — ABNORMAL HIGH (ref 0–19)

## 2024-11-01 LAB — PRO BRAIN NATRIURETIC PEPTIDE: Pro Brain Natriuretic Peptide: 229 pg/mL (ref <300.0)

## 2024-11-01 MED ORDER — ACETAMINOPHEN 500 MG PO TABS
1000.0000 mg | ORAL_TABLET | Freq: Once | ORAL | Status: AC
  Administered 2024-11-01: 1000 mg via ORAL
  Filled 2024-11-01: qty 2

## 2024-11-01 MED ORDER — SODIUM CHLORIDE 0.9 % IV SOLN
500.0000 mg | INTRAVENOUS | Status: DC
  Administered 2024-11-01: 500 mg via INTRAVENOUS
  Filled 2024-11-01: qty 5

## 2024-11-01 MED ORDER — BUDESONIDE 0.5 MG/2ML IN SUSP
2.0000 mg | Freq: Two times a day (BID) | RESPIRATORY_TRACT | Status: DC
  Administered 2024-11-01: 2 mg via RESPIRATORY_TRACT
  Filled 2024-11-01: qty 8

## 2024-11-01 MED ORDER — FUROSEMIDE 10 MG/ML IJ SOLN
40.0000 mg | Freq: Once | INTRAMUSCULAR | Status: AC
  Administered 2024-11-01: 40 mg via INTRAVENOUS
  Filled 2024-11-01: qty 4

## 2024-11-01 MED ORDER — ISOSORBIDE MONONITRATE ER 30 MG PO TB24
30.0000 mg | ORAL_TABLET | Freq: Every day | ORAL | Status: DC
  Administered 2024-11-02 – 2024-11-03 (×2): 30 mg via ORAL
  Filled 2024-11-01 (×2): qty 1

## 2024-11-01 MED ORDER — AMIODARONE HCL 200 MG PO TABS
200.0000 mg | ORAL_TABLET | Freq: Every day | ORAL | Status: DC
  Administered 2024-11-02 – 2024-11-03 (×2): 200 mg via ORAL
  Filled 2024-11-01 (×2): qty 1

## 2024-11-01 MED ORDER — ALBUTEROL SULFATE (2.5 MG/3ML) 0.083% IN NEBU
5.0000 mg | INHALATION_SOLUTION | Freq: Once | RESPIRATORY_TRACT | Status: AC
  Administered 2024-11-01: 5 mg via RESPIRATORY_TRACT
  Filled 2024-11-01: qty 6

## 2024-11-01 MED ORDER — ACETAMINOPHEN 325 MG PO TABS
650.0000 mg | ORAL_TABLET | Freq: Four times a day (QID) | ORAL | Status: DC | PRN
  Administered 2024-11-02 – 2024-11-03 (×3): 650 mg via ORAL
  Filled 2024-11-01 (×3): qty 2

## 2024-11-01 MED ORDER — FUROSEMIDE 10 MG/ML IJ SOLN
40.0000 mg | Freq: Two times a day (BID) | INTRAMUSCULAR | Status: DC
  Administered 2024-11-02 – 2024-11-03 (×3): 40 mg via INTRAVENOUS
  Filled 2024-11-01 (×3): qty 4

## 2024-11-01 MED ORDER — IPRATROPIUM-ALBUTEROL 0.5-2.5 (3) MG/3ML IN SOLN
3.0000 mL | RESPIRATORY_TRACT | Status: DC | PRN
  Administered 2024-11-01: 3 mL via RESPIRATORY_TRACT

## 2024-11-01 MED ORDER — IOHEXOL 350 MG/ML SOLN
75.0000 mL | Freq: Once | INTRAVENOUS | Status: AC | PRN
  Administered 2024-11-01: 75 mL via INTRAVENOUS

## 2024-11-01 MED ORDER — GUAIFENESIN ER 600 MG PO TB12
1200.0000 mg | ORAL_TABLET | Freq: Two times a day (BID) | ORAL | Status: DC
  Administered 2024-11-01 – 2024-11-03 (×4): 1200 mg via ORAL
  Filled 2024-11-01 (×4): qty 2

## 2024-11-01 MED ORDER — ROPINIROLE HCL 0.25 MG PO TABS
0.2500 mg | ORAL_TABLET | Freq: Every day | ORAL | Status: DC
  Administered 2024-11-01 – 2024-11-02 (×2): 0.25 mg via ORAL
  Filled 2024-11-01 (×2): qty 1

## 2024-11-01 MED ORDER — ATORVASTATIN CALCIUM 20 MG PO TABS
40.0000 mg | ORAL_TABLET | Freq: Every day | ORAL | Status: DC
  Administered 2024-11-02 – 2024-11-03 (×2): 40 mg via ORAL
  Filled 2024-11-01 (×2): qty 2

## 2024-11-01 MED ORDER — METHYLPREDNISOLONE SODIUM SUCC 40 MG IJ SOLR
40.0000 mg | Freq: Every day | INTRAMUSCULAR | Status: DC
  Administered 2024-11-02 – 2024-11-03 (×2): 40 mg via INTRAVENOUS
  Filled 2024-11-01 (×2): qty 1

## 2024-11-01 MED ORDER — ALPRAZOLAM 0.25 MG PO TABS
0.2500 mg | ORAL_TABLET | Freq: Every evening | ORAL | Status: DC | PRN
  Administered 2024-11-01 – 2024-11-02 (×2): 0.25 mg via ORAL
  Filled 2024-11-01 (×2): qty 1

## 2024-11-01 MED ORDER — ONDANSETRON HCL 4 MG PO TABS: 4.0000 mg | ORAL_TABLET | Freq: Four times a day (QID) | ORAL | Status: DC | PRN

## 2024-11-01 MED ORDER — ACETAMINOPHEN 650 MG RE SUPP: 650.0000 mg | Freq: Four times a day (QID) | RECTAL | Status: DC | PRN

## 2024-11-01 MED ORDER — IPRATROPIUM-ALBUTEROL 0.5-2.5 (3) MG/3ML IN SOLN
3.0000 mL | Freq: Once | RESPIRATORY_TRACT | Status: AC
  Administered 2024-11-01: 3 mL via RESPIRATORY_TRACT
  Filled 2024-11-01: qty 3

## 2024-11-01 MED ORDER — LEVOTHYROXINE SODIUM 50 MCG PO TABS
75.0000 ug | ORAL_TABLET | Freq: Every day | ORAL | Status: DC
  Administered 2024-11-02 – 2024-11-03 (×2): 75 ug via ORAL
  Filled 2024-11-01 (×2): qty 1

## 2024-11-01 MED ORDER — MAGNESIUM SULFATE 2 GM/50ML IV SOLN
2.0000 g | INTRAVENOUS | Status: AC
  Administered 2024-11-01: 2 g via INTRAVENOUS
  Filled 2024-11-01: qty 50

## 2024-11-01 MED ORDER — INFLUENZA VAC SPLIT HIGH-DOSE 0.5 ML IM SUSY
0.5000 mL | PREFILLED_SYRINGE | INTRAMUSCULAR | Status: AC
  Administered 2024-11-02: 0.5 mL via INTRAMUSCULAR
  Filled 2024-11-01: qty 0.5

## 2024-11-01 MED ORDER — IPRATROPIUM-ALBUTEROL 0.5-2.5 (3) MG/3ML IN SOLN
3.0000 mL | Freq: Four times a day (QID) | RESPIRATORY_TRACT | Status: DC
  Administered 2024-11-02 – 2024-11-03 (×6): 3 mL via RESPIRATORY_TRACT
  Filled 2024-11-01 (×7): qty 3

## 2024-11-01 MED ORDER — DAPAGLIFLOZIN PROPANEDIOL 10 MG PO TABS
10.0000 mg | ORAL_TABLET | Freq: Every day | ORAL | Status: DC
  Administered 2024-11-02 – 2024-11-03 (×2): 10 mg via ORAL
  Filled 2024-11-01 (×2): qty 1

## 2024-11-01 MED ORDER — GUAIFENESIN-DM 100-10 MG/5ML PO SYRP: 5.0000 mL | ORAL_SOLUTION | ORAL | Status: DC | PRN

## 2024-11-01 MED ORDER — APIXABAN 5 MG PO TABS
5.0000 mg | ORAL_TABLET | Freq: Two times a day (BID) | ORAL | Status: DC
  Administered 2024-11-01 – 2024-11-03 (×4): 5 mg via ORAL
  Filled 2024-11-01 (×4): qty 1

## 2024-11-01 MED ORDER — SUCRALFATE 1 G PO TABS
1.0000 g | ORAL_TABLET | Freq: Three times a day (TID) | ORAL | Status: DC
  Administered 2024-11-01 – 2024-11-03 (×6): 1 g via ORAL
  Filled 2024-11-01 (×6): qty 1

## 2024-11-01 MED ORDER — METHYLPREDNISOLONE NA SUC (PF) 125 MG IJ SOLR: 80.0000 mg | Freq: Once | INTRAMUSCULAR | Status: DC

## 2024-11-01 MED ORDER — PANTOPRAZOLE SODIUM 40 MG PO TBEC
40.0000 mg | DELAYED_RELEASE_TABLET | Freq: Every day | ORAL | Status: DC
  Administered 2024-11-02 – 2024-11-03 (×2): 40 mg via ORAL
  Filled 2024-11-01 (×2): qty 1

## 2024-11-01 MED ORDER — ONDANSETRON HCL 4 MG/2ML IJ SOLN: 4.0000 mg | Freq: Four times a day (QID) | INTRAMUSCULAR | Status: DC | PRN

## 2024-11-01 MED ORDER — METHYLPREDNISOLONE SODIUM SUCC 125 MG IJ SOLR
80.0000 mg | Freq: Once | INTRAMUSCULAR | Status: AC
  Administered 2024-11-01: 80 mg via INTRAVENOUS
  Filled 2024-11-01: qty 2

## 2024-11-01 MED ORDER — PREDNISONE 20 MG PO TABS: 40.0000 mg | ORAL_TABLET | Freq: Every day | ORAL | Status: DC

## 2024-11-01 MED ORDER — SPIRONOLACTONE 25 MG PO TABS
25.0000 mg | ORAL_TABLET | Freq: Every day | ORAL | Status: DC
  Administered 2024-11-02 – 2024-11-03 (×2): 25 mg via ORAL
  Filled 2024-11-01 (×2): qty 1

## 2024-11-01 MED ORDER — TRAMADOL HCL 50 MG PO TABS: 50.0000 mg | ORAL_TABLET | Freq: Four times a day (QID) | ORAL | Status: DC | PRN

## 2024-11-01 MED ORDER — POTASSIUM CHLORIDE CRYS ER 20 MEQ PO TBCR
40.0000 meq | EXTENDED_RELEASE_TABLET | Freq: Once | ORAL | Status: AC
  Administered 2024-11-01: 40 meq via ORAL
  Filled 2024-11-01: qty 2

## 2024-11-01 MED ORDER — IRBESARTAN 150 MG PO TABS
75.0000 mg | ORAL_TABLET | Freq: Every day | ORAL | Status: DC
  Administered 2024-11-02 – 2024-11-03 (×2): 75 mg via ORAL
  Filled 2024-11-01 (×2): qty 1

## 2024-11-01 NOTE — ED Notes (Signed)
 Called CCMD to place pt on cardiac monitoring

## 2024-11-01 NOTE — ED Notes (Signed)
 First Nurse Note: Pt to ED via [facility] from home for chest pain and shortness of breath that started last night. Pt rates pain 10/10. Pt was given 324 mg of Aspirin  and 1 spray of nitroglycerin . Pt is on Chronic O2 at 2 liters and is sating 100%. Pts CBG was 125. Pt has 20 G IV in her LAC. 12 Lead with EMS was WNL.

## 2024-11-01 NOTE — H&P (Signed)
 History and Physical    Patient: Linda Brady FMW:[id number] DOB: 22-Jan-1941 DOA: 11/01/2024 DOS: the patient was seen and examined on 11/01/2024 PCP: Valora Lynwood FALCON, MD  Patient coming from: Home  Chief Complaint:  Chief Complaint  Patient presents with   Shortness of Breath   HPI: Linda Brady is a 83 y.o. 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  She was diagnosed with pneumonia and has been on antibiotics, tomorrow will be her last day of treatment.  She denies any improvement at all. Reports shortness of breath is worse with activity, associated with wheezing, palpitations, and dizziness without syncope.  This morning she noted worsening lower extremity swelling.  She has no fevers, chest pain, nausea, vomiting, diarrhea, dysuria, headaches, focal weakness, changes in sensation.  In emergency department, she was hemodynamically stable saturating 100% on room air with increased work of breathing.  CTA was negative for PE, pulmonary edema, and pneumonia.  There are some mild bronchial wall thickening and mucous plugging in the lower lobes.  Her symptoms were refractory to steroids and nebulizers given in the emergency department, prompting hospitalist consultation for admission.  Review of Systems: Review of Systems  Constitutional:  Positive for malaise/fatigue. Negative for chills, fever and weight loss.  HENT:  Positive for sore throat.   Respiratory:  Positive for cough, sputum production, shortness of breath and wheezing. Negative for hemoptysis.   Cardiovascular:  Positive for palpitations, orthopnea, leg swelling and PND. Negative for chest pain.  Gastrointestinal:  Negative for abdominal pain, constipation,  diarrhea, nausea and vomiting.  Genitourinary:  Negative for frequency and hematuria.  Musculoskeletal:  Negative for falls, joint pain and myalgias.  Skin:  Negative for rash.  Neurological:  Positive for dizziness. Negative for speech change, focal weakness and weakness.  Endo/Heme/Allergies:  Bruises/bleeds easily.  Psychiatric/Behavioral:  The patient is nervous/anxious.     Past Medical History:  Diagnosis Date   A-fib [facility])    CHF (congestive heart failure) (HCC)    COPD (chronic obstructive pulmonary disease) (HCC)    GERD (gastroesophageal reflux disease)    Hypertension    Hypothyroidism    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   s/p TAVR with a 23 mm Edwards S3UR via TF approach by Dr. Wendel and Dr. Maryjane   Thyroid  disease    Past Surgical History:  Procedure Laterality Date   BREAST CYST ASPIRATION Left 10 plus yrs   benign-twice   CATARACT EXTRACTION W/PHACO Left 03/20/2023   Procedure: CATARACT EXTRACTION PHACO AND INTRAOCULAR LENS PLACEMENT (IOC) LEFT VISION BLUE;  Surgeon: Enola Feliciano Hugger, MD;  Location: [facility];  Service: Ophthalmology;  Laterality: Left;  21.02   01:38.2   INTRAOPERATIVE TRANSTHORACIC ECHOCARDIOGRAM N/A 12/16/2023   Procedure: INTRAOPERATIVE TRANSTHORACIC ECHOCARDIOGRAM;  Surgeon: Wendel Lurena POUR, MD;  Location: [facility];  Service: Cardiovascular;  Laterality: N/A;   IR RADIOLOGIST EVAL & MGMT  06/22/2024   IR RADIOLOGIST EVAL & MGMT  08/31/2024   IR VERTEBROPLASTY LUMBAR BX INC UNI/BIL INC/INJECT/IMAGING  06/29/2024   LEFT HEART CATH AND CORONARY ANGIOGRAPHY N/A 09/11/2020   Procedure: LEFT HEART CATH AND CORONARY ANGIOGRAPHY;  Surgeon: Ammon Blunt, MD;  Location: [facility] INVASIVE CV LAB;  Service: Cardiovascular;  Laterality: N/A;   PERIPHERAL VASCULAR  INTERVENTION Right 10/24/2023   Procedure: PERIPHERAL VASCULAR INTERVENTION;  Surgeon: Magda Debby SAILOR, MD;  [facility]: [facility] CV  [facility];  Service: Cardiovascular;  Laterality: Right;  right axillary stent   RIGHT/LEFT HEART CATH AND CORONARY ANGIOGRAPHY N/A 10/23/2023   Procedure: RIGHT/LEFT HEART CATH AND CORONARY ANGIOGRAPHY;  Surgeon: Wendel Lurena POUR, MD;  [facility]: [facility];  Service: Cardiovascular;  Laterality: N/A;   UPPER EXTREMITY ANGIOGRAPHY Right 10/24/2023   Procedure: Upper Extremity Angiography;  Surgeon: Magda Debby SAILOR, MD;  Location: [facility] INVASIVE CV LAB;  Service: Cardiovascular;  Laterality: Right;   Social History:  reports that she has quit smoking. Her smoking use included cigarettes. She has a 6 pack-year smoking history. She has never used smokeless tobacco. She reports that she does not currently use alcohol. She reports that she does not currently use drugs.  Allergies  Allergen Reactions   Naproxen Sodium Anaphylaxis    Family History  Problem Relation Age of Onset   Breast cancer Neg Hx     Prior to Admission medications   Medication Sig Start Date End Date Taking? Authorizing Provider  ALPRAZolam  (XANAX ) 0.25 MG tablet Take 0.25 mg by mouth at bedtime as needed for sleep.    [provider]  amiodarone  (PACERONE ) 200 MG tablet Take 1 tablet (200 mg total) by mouth daily. 01/16/24   Sebastian Lamarr SAUNDERS, PA-C  apixaban  (ELIQUIS ) 5 MG TABS tablet Take 5 mg by mouth 2 (two) times daily.    [provider]  atorvastatin  (LIPITOR) 40 MG tablet Take 1 tablet (40 mg total) by mouth daily. 09/20/23   Alexander, Natalie, DO  cholecalciferol  (VITAMIN D3) 25 MCG (1000 UNIT) tablet Take 1,000 Units by mouth daily.    [provider]  dapagliflozin  propanediol (FARXIGA ) 10 MG TABS tablet Take 1 tablet (10 mg total) by mouth daily before breakfast. 05/26/24   Donette Ellouise LABOR, FNP  furosemide  (LASIX ) 20 MG tablet Take 10 mg by mouth daily.    [provider]  Ipratropium-Albuterol  (COMBIVENT  RESPIMAT) 20-100 MCG/ACT AERS respimat Inhale 1 puff into the lungs  every 6 (six) hours as needed for wheezing.    [provider]  isosorbide  mononitrate (IMDUR ) 30 MG 24 hr tablet Take 30 mg by mouth daily.    [provider]  levalbuterol  (XOPENEX ) 1.25 MG/3ML nebulizer solution Take 1.25 mg by nebulization every 6 (six) hours as needed. 12/08/23 12/07/24  [provider]  levothyroxine  (SYNTHROID ) 75 MCG tablet Take 75 mcg by mouth daily before breakfast.    [provider]  lidocaine  (LIDODERM ) 5 % Place 1 patch onto the skin daily. 06/08/24   [provider]  mometasone -formoterol  (DULERA ) 200-5 MCG/ACT AERO Inhale 2 puffs into the lungs 2 (two) times daily. 12/17/23   Sebastian Lamarr SAUNDERS, PA-C  montelukast  (SINGULAIR ) 10 MG tablet Take 1 tablet by mouth at bedtime. 02/03/24 02/02/25  [provider]  pantoprazole  (PROTONIX ) 40 MG tablet Take 40 mg by mouth daily.    [provider]  potassium chloride  SA (KLOR-CON  M) 20 MEQ tablet Take 2 tablets (40 mEq total) by mouth daily. 06/25/24   Donette Ellouise LABOR, FNP  rOPINIRole  (REQUIP ) 0.25 MG tablet Take 1 tablet (0.25 mg total) by mouth at bedtime. 10/28/23   Leotis Bogus, MD  spironolactone  (ALDACTONE ) 25 MG tablet Take 1 tablet (25 mg total) by mouth daily. 06/18/24   Donette Ellouise LABOR, FNP  sucralfate  (CARAFATE ) 1 g tablet Take 1 g by mouth 4 (four)  times daily -  with meals and at bedtime.    [provider]  valsartan  (DIOVAN ) 40 MG tablet Take 0.5 tablets (20 mg total) by mouth daily. 09/21/24   Donette Ellouise LABOR, FNP  vitamin B-12 (CYANOCOBALAMIN ) 1000 MCG tablet Take 1,000 mcg by mouth daily.    [provider]    Physical Exam: Vitals:   11/01/24 1754 11/01/24 2030 11/01/24 2045 11/01/24 2105  BP: (!) 159/68 (!) 154/67  (!) 161/68  Pulse: 85 69 68 69  Resp: 18 20 14 18   Temp: 97.7 F (36.5 C)  98.9 F (37.2 C) 97.7 F (36.5 C)  TempSrc: Oral  Oral Oral  SpO2: 100% 99% 100% 100%  Weight:      Physical Exam Vitals and nursing  note reviewed.  Constitutional:      Appearance: She is ill-appearing. She is not toxic-appearing.  HENT:     Head: Normocephalic and atraumatic.  Eyes:     Extraocular Movements: Extraocular movements intact.     Pupils: Pupils are equal, round, and reactive to light.  Cardiovascular:     Rate and Rhythm: Normal rate and regular rhythm.  Pulmonary:     Breath sounds: Wheezing and rhonchi present.     Comments: Increased WOB Abdominal:     Palpations: Abdomen is soft.     Tenderness: There is no abdominal tenderness.  Musculoskeletal:     Cervical back: Neck supple.     Right lower leg: Edema present.     Left lower leg: Edema present.  Skin:    General: Skin is warm and dry.     Capillary Refill: Capillary refill takes less than 2 seconds.  Neurological:     General: No focal deficit present.     Mental Status: She is alert and oriented to person, place, and time.  Psychiatric:        Mood and Affect: Mood is anxious.     Data Reviewed:   Labs on Admission: I have personally reviewed following labs and imaging studies  CBC: Recent Labs  Lab 11/01/24 1440  WBC 5.3  HGB 10.2*  HCT 32.9*  MCV 77.0*  PLT 371   Basic Metabolic Panel: Recent Labs  Lab 11/01/24 1440 11/01/24 1613  NA 134*  --   K 2.9*  --   CL 94*  --   CO2 27  --   GLUCOSE 99  --   BUN 10  --   CREATININE 1.00  --   CALCIUM  9.6  --   MG  --  2.0   GFR: Estimated Creatinine Clearance: 42.6 mL/min (by C-G formula based on SCr of 1 mg/dL). Liver Function Tests: No results for input(s): AST, ALT, ALKPHOS, BILITOT, PROT, ALBUMIN in the last 168 hours. No results for input(s): LIPASE, AMYLASE in the last 168 hours. No results for input(s): AMMONIA in the last 168 hours. Coagulation Profile: No results for input(s): INR, PROTIME in the last 168 hours. Cardiac Enzymes: No results for input(s): CKTOTAL, CKMB, CKMBINDEX, TROPONINI in the last 168 hours. BNP (last 3  results) Recent Labs    12/26/23 1104 11/01/24 1440  PROBNP 323 229.0   HbA1C: No results for input(s): HGBA1C in the last 72 hours. CBG: No results for input(s): GLUCAP in the last 168 hours. Lipid Profile: No results for input(s): CHOL, HDL, LDLCALC, TRIG, CHOLHDL, LDLDIRECT in the last 72 hours. Thyroid  Function Tests: No results for input(s): TSH, T4TOTAL, FREET4, T3FREE, THYROIDAB in the last 72 hours.  Anemia Panel: No results for input(s): VITAMINB12, FOLATE, FERRITIN, TIBC, IRON, RETICCTPCT in the last 72 hours. Urine analysis:    Component Value Date/Time   COLORURINE STRAW (A) 07/27/2024 1828   APPEARANCEUR CLEAR (A) 07/27/2024 1828   APPEARANCEUR Hazy 11/12/2012 1355   LABSPEC 1.010 07/27/2024 1828   LABSPEC 1.005 11/12/2012 1355   PHURINE 7.0 07/27/2024 1828   GLUCOSEU >=500 (A) 07/27/2024 1828   GLUCOSEU Negative 11/12/2012 1355   HGBUR NEGATIVE 07/27/2024 1828   BILIRUBINUR NEGATIVE 07/27/2024 1828   BILIRUBINUR Negative 11/12/2012 1355   KETONESUR NEGATIVE 07/27/2024 1828   PROTEINUR NEGATIVE 07/27/2024 1828   NITRITE NEGATIVE 07/27/2024 1828   LEUKOCYTESUR NEGATIVE 07/27/2024 1828   LEUKOCYTESUR Trace 11/12/2012 1355    Radiological Exams on Admission: CT Angio Chest PE W and/or Wo Contrast Result Date: 11/01/2024 EXAM: CTA CHEST 11/01/2024 06:58:18 PM TECHNIQUE: CTA of the chest was performed with the administration of 75 mL of iohexol  (OMNIPAQUE ) 350 MG/ML injection. Multiplanar reformatted images are provided for review. MIP images are provided for review. Automated exposure control, iterative reconstruction, and/or weight based adjustment of the mA/kV was utilized to reduce the radiation dose to as low as reasonably achievable. COMPARISON: Same day x-ray and CT chest 23/25. CLINICAL HISTORY: Pulmonary embolism (PE) suspected, low to intermediate prob, positive D-dimer. Chest pain and shortness of breath. FINDINGS:  PULMONARY ARTERIES: Pulmonary arteries are adequately opacified for evaluation. No acute pulmonary embolus. Main pulmonary artery is normal in caliber. MEDIASTINUM: The heart and pericardium demonstrate no acute abnormality. Coronary artery and aortic atherosclerotic calcifications. There is no acute abnormality of the thoracic aorta. LYMPH NODES: No mediastinal, hilar or axillary lymphadenopathy. LUNGS AND PLEURA: Emphysema. Mild bronchial wall thickening and mucus plugging in the lower lobes. Scarring / atelectasis in the lower lungs. No focal consolidation or pulmonary edema. No evidence of pleural effusion or pneumothorax. UPPER ABDOMEN: Large hiatal hernia. Limited images of the upper abdomen are otherwise unremarkable. SOFT TISSUES AND BONES: Superior endplate compression fracture of L1, new since MRI 07/10/2024. There is approximately 25% vertebral body height loss and 4 mm of retropulsion of the superior endplate. No acute soft tissue abnormality. IMPRESSION: 1. No pulmonary embolism. 2. New superior endplate compression fracture of L1 with approximately 25% vertebral body height loss and 4 mm of retropulsion of the superior endplate. 3. Pulmonary emphysema and chronic bronchitis. Electronically signed by: Norman Gatlin MD 11/01/2024 07:54 PM EST RP Workstation: [id number]   DG Chest 2 View Result Date: 11/01/2024 CLINICAL DATA:  Shortness of breath and chest pain. EXAM: CHEST - 2 VIEW COMPARISON:  07/27/2024 and CT chest 04/23/2024. FINDINGS: Trachea is midline. Heart is enlarged, stable. Aortic valve replacement. No airspace consolidation or pleural fluid. Minimal streaky scarring in the lung bases. Degenerative changes in the spine. IMPRESSION: No acute findings. Electronically Signed   By: Newell Eke M.D.   On: 11/01/2024 16:02       Assessment and Plan:  COPD exacerbation  Chronic hypoxic respiratory failure  - Saturating appropriately on her baseline oxygen requirement - CTA  negative for PE, pneumonia, effusions, edema. +ve chronic bronchitis  - solumedrol, sch duonebs/budesonide , mucolytics - empiric IV azithromycin   - incentive spirometry - COVID/flu/RSV and respiratory pathogen panel pending  - Supportive care  HFpEF, mild exacerbation   Non ishcemic CAD HTN - with some LE edema, no evidence of vascular congestion or pulmonary edema on chest imaging  - denies chest pain, troponin mildly elevated and flat   - continue spironolactone  and  IV lasix  40mg  BID for now  - farxiga , isosorbide , ARB resumed    Paroxysmal afib  - cont amiodarone  and eliquis    Anxiety RLS - home medications resumed   Hypothyroidism  - Levothyroxine  resumed   Eliquis  Heart healthy diet  No ivf  Monitor/replace electrolytes    Advance Care Planning:   Code Status: Full Code Discussed with patient at time of admission    Severity of Illness: The appropriate patient status for this patient is OBSERVATION. Observation status is judged to be reasonable and necessary in order to provide the required intensity of service to ensure the patient's safety. The patient's presenting symptoms, physical exam findings, and initial radiographic and laboratory data in the context of their medical condition is felt to place them at decreased risk for further clinical deterioration. Furthermore, it is anticipated that the patient will be medically stable for discharge from the hospital within 2 midnights of admission.   Author: Daved JAYSON Pump, DO 11/01/2024 9:23 PM  For on call review [email or web address].

## 2024-11-01 NOTE — ED Notes (Signed)
 Family at bedside.

## 2024-11-01 NOTE — ED Notes (Signed)
 Pt daughter called & updated on pt & plan of care.

## 2024-11-01 NOTE — ED Triage Notes (Signed)
 C?O SOB and chest pain since last night.  Wears 2l/ .  Awake, alert, DOE/ Dyspnea at rest. Skin warm and dry.

## 2024-11-01 NOTE — ED Provider Notes (Addendum)
 [facility] Provider Note    Event Date/Time   First MD Initiated Contact with Patient 11/01/24 1501     (approximate)   History   Chief Complaint: Shortness of Breath   HPI  Linda Brady is a 83 y.o. female with a history of atrial fibrillation, CHF COPD who comes ED complaining of worsening shortness of breath and dyspnea exertion associated with chest tightness and pressure.  Also reports worsening of longstanding PND and orthopnea, having to sleep on 3 pillows for the last few days.  Wears 2 L nasal cannula at baseline.  Compliant with medications.    Denies fever or productive cough.       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 Wendel and Dr. Maryjane   Thyroid  disease     Current Outpatient Rx   Order #: [id number] Class: Historical Med   Order #: [id number] Class: No Print   Order #: [id number] Class: Historical Med   Order #: [id number] Class: Normal   Order #: [id number] Class: Historical Med   Order #: [id number] Class: Normal   Order #: [id number] Class: Historical Med   Order #: [id number] Class: Historical Med   Order #: [id number] Class: Historical Med   Order #: [id number] Class: Historical Med   Order #: [id number] Class: Historical Med   Order #: [id number] Class: Historical Med   Order #: [id number] Class: Normal   Order #: [id number] Class: Historical Med   Order #: [id number] Class: Historical Med   Order #: [id number] Class: No Print   Order #: [id number] Class: Normal   Order #: [id number] Class: No Print   Order #: [id number] Class: Historical Med   Order #: [id number] Class: Normal   Order #: [id number] Class: Historical Med    Past Surgical  History:  Procedure Laterality Date   BREAST CYST ASPIRATION Left 10 plus yrs   benign-twice   CATARACT EXTRACTION W/PHACO Left 03/20/2023   Procedure: CATARACT EXTRACTION PHACO AND INTRAOCULAR LENS PLACEMENT (IOC) LEFT VISION BLUE;  Surgeon: Enola Feliciano Hugger, MD;  Location: [facility];  Service: Ophthalmology;  Laterality: Left;  21.02   01:38.2   INTRAOPERATIVE TRANSTHORACIC ECHOCARDIOGRAM N/A 12/16/2023   Procedure: INTRAOPERATIVE TRANSTHORACIC ECHOCARDIOGRAM;  Surgeon: Wendel Lurena POUR, MD;  Location: [facility];  Service: Cardiovascular;  Laterality: N/A;   IR RADIOLOGIST EVAL & MGMT  06/22/2024   IR RADIOLOGIST EVAL & MGMT  08/31/2024   IR VERTEBROPLASTY LUMBAR BX INC UNI/BIL INC/INJECT/IMAGING  06/29/2024   LEFT HEART CATH AND CORONARY ANGIOGRAPHY N/A 09/11/2020   Procedure: LEFT HEART CATH AND CORONARY ANGIOGRAPHY;  Surgeon: Ammon Blunt, MD;  Location: [facility] INVASIVE CV LAB;  Service: Cardiovascular;  Laterality: N/A;   PERIPHERAL VASCULAR INTERVENTION Right 10/24/2023   Procedure: PERIPHERAL VASCULAR INTERVENTION;  Surgeon: Magda Debby SAILOR, MD;  Location: [facility];  Service: Cardiovascular;  Laterality: Right;  right axillary stent   RIGHT/LEFT HEART CATH AND CORONARY ANGIOGRAPHY N/A 10/23/2023   Procedure: RIGHT/LEFT HEART CATH AND CORONARY ANGIOGRAPHY;  Surgeon: Wendel Lurena POUR, MD;  Location: [facility];  Service: Cardiovascular;  Laterality: N/A;   UPPER EXTREMITY ANGIOGRAPHY Right 10/24/2023   Procedure: Upper Extremity Angiography;  Surgeon: Magda Debby SAILOR, MD;  Location:  [facility];  Service: Cardiovascular;  Laterality: Right;    Physical Exam   Triage Vital Signs: ED Triage Vitals  Encounter Vitals Group     BP 11/01/24 1427 (!) 170/74     Girls Systolic BP Percentile --      Girls Diastolic BP Percentile --      Boys Systolic BP Percentile --      Boys Diastolic BP Percentile --      Pulse Rate 11/01/24 1427  73     Resp 11/01/24 1427 18     Temp 11/01/24 1427 97.7 F (36.5 C)     Temp Source 11/01/24 1427 Oral     SpO2 11/01/24 1427 100 %     Weight 11/01/24 1434 163 lb 4 oz (74 kg)     Height --      Head Circumference --      Peak Flow --      Pain Score 11/01/24 1434 5     Pain Loc --      Pain Education --      Exclude from Growth Chart --     Most recent vital signs: Vitals:   11/01/24 1436 11/01/24 1754  BP:  (!) 159/68  Pulse:  85  Resp:  18  Temp:  97.7 F (36.5 C)  SpO2: 100% 100%    General: Awake, no distress.  CV:  Good peripheral perfusion.  Regular rate rhythm Resp:  Normal effort.  Clear lungs bilaterally, slight end expiratory wheezing.  No crackles.  Symmetric air movement.. Abd:  No distention.  Soft nontender Other:  Trace lower extremity edema bilateral, baseline per patient.  No calf tenderness   ED Results / Procedures / Treatments   Labs (all labs ordered are listed, but only abnormal results are displayed) Labs Reviewed  BASIC METABOLIC PANEL WITH GFR - Abnormal; Notable for the following components:      Result Value   Sodium 134 (*)    Potassium 2.9 (*)    Chloride 94 (*)    GFR, Estimated 57 (*)    All other components within normal limits  CBC - Abnormal; Notable for the following components:   Hemoglobin 10.2 (*)    HCT 32.9 (*)    MCV 77.0 (*)    MCH 23.9 (*)    RDW 18.8 (*)    All other components within normal limits  D-DIMER, QUANTITATIVE - Abnormal; Notable for the following components:   D-Dimer, Quant 0.61 (*)    All other components within normal limits  TROPONIN T, HIGH SENSITIVITY - Abnormal; Notable for the following components:   Troponin T High Sensitivity 29 (*)    All other components within normal limits  TROPONIN T, HIGH SENSITIVITY - Abnormal; Notable for the following components:   Troponin T High Sensitivity 29 (*)    All other components within normal limits  PRO BRAIN NATRIURETIC PEPTIDE  MAGNESIUM       EKG Interpreted by me Sinus rhythm rate of 73.  Normal axis.  First-degree AV block.  Poor R wave progression.  No acute ischemic changes.   RADIOLOGY Chest x-ray interpreted by me, unremarkable.  Radiology report reviewed   PROCEDURES:  Procedures   MEDICATIONS ORDERED IN ED: Medications  furosemide  (LASIX ) injection 40 mg (40 mg Intravenous Given 11/01/24 1543)  potassium chloride  SA (KLOR-CON  M) CR tablet 40 mEq (40 mEq Oral Given 11/01/24 1543)  albuterol  (PROVENTIL ) (2.5 MG/3ML) 0.083% nebulizer solution 5 mg (5 mg Nebulization  Given 11/01/24 1544)  acetaminophen  (TYLENOL ) tablet 1,000 mg (1,000 mg Oral Given 11/01/24 1908)  ipratropium-albuterol  (DUONEB) 0.5-2.5 (3) MG/3ML nebulizer solution 3 mL (3 mLs Nebulization Given 11/01/24 1911)  magnesium  sulfate IVPB 2 g 50 mL (0 g Intravenous Stopped 11/01/24 2009)  methylPREDNISolone  sodium succinate (SOLU-MEDROL ) 125 mg/2 mL injection 80 mg (80 mg Intravenous Given 11/01/24 1909)  iohexol  (OMNIPAQUE ) 350 MG/ML injection 75 mL (75 mLs Intravenous Contrast Given 11/01/24 1848)     IMPRESSION / MDM / ASSESSMENT AND PLAN / ED COURSE  I reviewed the triage vital signs and the nursing notes.  DDx: COPD exacerbation, viral illness, NSTEMI, pleural effusion, pulmonary edema, pulmonary embolism, pneumonia, electrolyte derangement, anemia  Patient's presentation is most consistent with acute presentation with potential threat to life or bodily function.  Patient with COPD A-fib CHF comes to the ED with worsening shortness of breath on exertion.  Vital signs and exam are reassuring.  Labs show chronic mild anemia.  She does have hypokalemia of 2.9 which appears acute, likely due to diuretic use.  Will trend troponin, check BNP, D-dimer, chest x-ray, give albuterol  and Lasix  and reassess.   ----------------------------------------- 8:21 PM on 11/01/2024 ----------------------------------------- D-dimer elevated and BNP not consistent  with CHF exacerbation.  Patient still symptomatic, so CT chest was obtained which is negative for PE or occult pneumonia.  Additional bronchodilators, steroids were given, and patient is still feeling very symptomatic.  At this point presentation appears to be due to a COPD exacerbation and with refractory severe symptoms, will need to hospitalize for further management.  Not having chest pain.  Doubt unstable angina.  Clinical Course as of 11/01/24 2037  Mon Nov 01, 2024  2036 Case discussed with hospitalist [PS]    Clinical Course User Index [PS] Viviann Pastor, MD     FINAL CLINICAL IMPRESSION(S) / ED DIAGNOSES   Final diagnoses:  COPD exacerbation (HCC)  Chronic respiratory failure with hypoxia (HCC)     Rx / DC Orders   ED Discharge Orders     None        Note:  This document was prepared using Dragon voice recognition software and may include unintentional dictation errors.   Viviann Pastor, MD 11/01/24 2022    Viviann Pastor, MD 11/01/24 2037

## 2024-11-02 DIAGNOSIS — J441 Chronic obstructive pulmonary disease with (acute) exacerbation: Secondary | ICD-10-CM | POA: Diagnosis present

## 2024-11-02 DIAGNOSIS — J9611 Chronic respiratory failure with hypoxia: Secondary | ICD-10-CM | POA: Diagnosis present

## 2024-11-02 DIAGNOSIS — Z7901 Long term (current) use of anticoagulants: Secondary | ICD-10-CM | POA: Diagnosis not present

## 2024-11-02 DIAGNOSIS — E876 Hypokalemia: Secondary | ICD-10-CM | POA: Diagnosis present

## 2024-11-02 DIAGNOSIS — Z952 Presence of prosthetic heart valve: Secondary | ICD-10-CM | POA: Diagnosis not present

## 2024-11-02 DIAGNOSIS — D509 Iron deficiency anemia, unspecified: Secondary | ICD-10-CM | POA: Diagnosis present

## 2024-11-02 DIAGNOSIS — I480 Paroxysmal atrial fibrillation: Secondary | ICD-10-CM | POA: Diagnosis present

## 2024-11-02 DIAGNOSIS — J440 Chronic obstructive pulmonary disease with acute lower respiratory infection: Secondary | ICD-10-CM | POA: Diagnosis present

## 2024-11-02 DIAGNOSIS — I2510 Atherosclerotic heart disease of native coronary artery without angina pectoris: Secondary | ICD-10-CM | POA: Diagnosis present

## 2024-11-02 DIAGNOSIS — G2581 Restless legs syndrome: Secondary | ICD-10-CM | POA: Diagnosis present

## 2024-11-02 DIAGNOSIS — I110 Hypertensive heart disease with heart failure: Secondary | ICD-10-CM | POA: Diagnosis present

## 2024-11-02 DIAGNOSIS — G8929 Other chronic pain: Secondary | ICD-10-CM | POA: Diagnosis present

## 2024-11-02 DIAGNOSIS — K219 Gastro-esophageal reflux disease without esophagitis: Secondary | ICD-10-CM | POA: Diagnosis present

## 2024-11-02 DIAGNOSIS — Z79890 Hormone replacement therapy (postmenopausal): Secondary | ICD-10-CM | POA: Diagnosis not present

## 2024-11-02 DIAGNOSIS — Z9981 Dependence on supplemental oxygen: Secondary | ICD-10-CM | POA: Diagnosis not present

## 2024-11-02 DIAGNOSIS — Z1152 Encounter for screening for COVID-19: Secondary | ICD-10-CM | POA: Diagnosis not present

## 2024-11-02 DIAGNOSIS — M4856XA Collapsed vertebra, not elsewhere classified, lumbar region, initial encounter for fracture: Secondary | ICD-10-CM | POA: Diagnosis present

## 2024-11-02 DIAGNOSIS — J189 Pneumonia, unspecified organism: Secondary | ICD-10-CM | POA: Diagnosis present

## 2024-11-02 DIAGNOSIS — I5032 Chronic diastolic (congestive) heart failure: Secondary | ICD-10-CM | POA: Diagnosis present

## 2024-11-02 DIAGNOSIS — Z7951 Long term (current) use of inhaled steroids: Secondary | ICD-10-CM | POA: Diagnosis not present

## 2024-11-02 DIAGNOSIS — D6861 Antiphospholipid syndrome: Secondary | ICD-10-CM | POA: Diagnosis present

## 2024-11-02 DIAGNOSIS — E039 Hypothyroidism, unspecified: Secondary | ICD-10-CM | POA: Diagnosis present

## 2024-11-02 DIAGNOSIS — F419 Anxiety disorder, unspecified: Secondary | ICD-10-CM | POA: Diagnosis present

## 2024-11-02 DIAGNOSIS — Z23 Encounter for immunization: Secondary | ICD-10-CM | POA: Diagnosis present

## 2024-11-02 LAB — CBC
HCT: 32.7 % — ABNORMAL LOW (ref 36.0–46.0)
Hemoglobin: 10.1 g/dL — ABNORMAL LOW (ref 12.0–15.0)
MCH: 24.1 pg — ABNORMAL LOW (ref 26.0–34.0)
MCHC: 30.9 g/dL (ref 30.0–36.0)
MCV: 78 fL — ABNORMAL LOW (ref 80.0–100.0)
Platelets: 358 K/uL (ref 150–400)
RBC: 4.19 MIL/uL (ref 3.87–5.11)
RDW: 18.9 % — ABNORMAL HIGH (ref 11.5–15.5)
WBC: 5.6 K/uL (ref 4.0–10.5)
nRBC: 0 % (ref 0.0–0.2)

## 2024-11-02 LAB — RESPIRATORY PANEL BY PCR

## 2024-11-02 LAB — FOLATE: Folate: 8.7 ng/mL (ref >5.9)

## 2024-11-02 LAB — BASIC METABOLIC PANEL WITH GFR
Anion gap: 13 (ref 5–15)
BUN: 10 mg/dL (ref 8–23)
CO2: 27 mmol/L (ref 22–32)
Calcium: 9.6 mg/dL (ref 8.9–10.3)
Chloride: 95 mmol/L — ABNORMAL LOW (ref 98–111)
Creatinine, Ser: 0.97 mg/dL (ref 0.44–1.00)
GFR, Estimated: 59 mL/min — ABNORMAL LOW (ref >60)
Glucose, Bld: 151 mg/dL — ABNORMAL HIGH (ref 70–99)
Potassium: 3.2 mmol/L — ABNORMAL LOW (ref 3.5–5.1)
Sodium: 135 mmol/L (ref 135–145)

## 2024-11-02 LAB — VITAMIN B12: Vitamin B-12: 1708 pg/mL — ABNORMAL HIGH (ref 180–914)

## 2024-11-02 LAB — IRON AND TIBC
Iron: 22 ug/dL — ABNORMAL LOW (ref 28–170)
Saturation Ratios: 5 % — ABNORMAL LOW (ref 10.4–31.8)
TIBC: 440 ug/dL (ref 250–450)
UIBC: 418 ug/dL

## 2024-11-02 LAB — FERRITIN: Ferritin: 22 ng/mL (ref 11–307)

## 2024-11-02 LAB — RETICULOCYTES
Immature Retic Fract: 19.6 % — ABNORMAL HIGH (ref 2.3–15.9)
RBC.: 4.24 MIL/uL (ref 3.87–5.11)
Retic Count, Absolute: 56 K/uL (ref 19.0–186.0)
Retic Ct Pct: 1.3 % (ref 0.4–3.1)

## 2024-11-02 MED ORDER — LIDOCAINE 5 % EX PTCH
1.0000 | MEDICATED_PATCH | CUTANEOUS | Status: DC
  Administered 2024-11-02 – 2024-11-03 (×2): 1 via TRANSDERMAL
  Filled 2024-11-02 (×2): qty 1

## 2024-11-02 MED ORDER — BUDESONIDE 0.5 MG/2ML IN SUSP
0.5000 mg | Freq: Two times a day (BID) | RESPIRATORY_TRACT | Status: DC
  Administered 2024-11-02 – 2024-11-03 (×2): 0.5 mg via RESPIRATORY_TRACT
  Filled 2024-11-02 (×2): qty 2

## 2024-11-02 MED ORDER — FERROUS SULFATE 325 (65 FE) MG PO TABS
325.0000 mg | ORAL_TABLET | Freq: Every day | ORAL | Status: DC
  Administered 2024-11-02 – 2024-11-03 (×2): 325 mg via ORAL
  Filled 2024-11-02 (×2): qty 1

## 2024-11-02 MED ORDER — POTASSIUM CHLORIDE CRYS ER 20 MEQ PO TBCR
40.0000 meq | EXTENDED_RELEASE_TABLET | Freq: Once | ORAL | Status: AC
  Administered 2024-11-02: 40 meq via ORAL
  Filled 2024-11-02: qty 2

## 2024-11-02 NOTE — Progress Notes (Signed)
 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  1 week prior to arrival she was diagnosed with pneumonia and has been on antibiotics.  She denies any improvement since initiation. In the ED she was maintaining O2 sats 100% on room air but did have increased work of breathing.  CT negative for PE, pulmonary edema, or pneumonia.  Some bronchial wall thickening and mucous plugging in lower lobes.  Symptoms were refractory to steroids and nebulizers and thus the patient was admitted for further management  Subjective: No acute events overnight.  This morning the patient reports she is breathing a little bit easier, but still not at her baseline   Objective: Vitals:   11/01/24 2105 11/01/24 2241 11/02/24 0217 11/02/24 0434  BP: (!) 161/68   139/74  Pulse: 69   73  Resp: 18   16  Temp: 97.7 F (36.5 C)   97.8 F (36.6 C)  TempSrc: Oral   Oral  SpO2: 100% 100% 100% 99%  Weight: 72.4 kg       Intake/Output Summary (Last 24 hours) at 11/02/2024 0759 Last data filed at 11/01/2024 2009 Gross per 24 hour  Intake 50 ml  Output --  Net 50 ml   Filed Weights   11/01/24 1434 11/01/24 2105  Weight: 74 kg 72.4 kg    Examination: General exam: Appears calm and comfortable, NAD  Respiratory system: Tachypneic, dyspneic when speaking, no wheezing, both lungs clear to auscultation bilaterally Cardiovascular system: S1 & S2 heard, RRR.  Gastrointestinal system: Abdomen is nondistended, soft and nontender.  Neuro: Alert and oriented. No  focal neurological deficits. Extremities: Symmetric, expected ROM Skin: No rashes, lesions Psychiatry: Demonstrates appropriate judgement and insight. Mood & affect appropriate for situation.   Assessment & Plan:  Principal Problem:   COPD exacerbation (HCC) Active Problems:   Chronic heart failure with preserved ejection fraction (HFpEF) (HCC)   COPD exacerbation - Currently at her baseline O2 requirement - Still dyspneic and tachypneic on exam today. - CTA negative for PE, pneumonia, effusions.  Does reveal findings of chronic bronchitis - Continue Solu-Medrol  - Scheduled DuoNebs and mucolytics - Was started on azithromycin  but given no leukocytosis, fever, or infiltrates will discontinue. - RVP negative - Courage I-S and FV  Chronic hypoxic respiratory failure - At baseline patient is on 2 L Simms, she is at her baseline now - Continue supportive care for exacerbation as above - PT/OT consults  Hypokalemia - Replace and continue to monitor  Heart failure preserved EF Nonischemic CAD - Some lower extremity edema on arrival - Troponin on arrival 29, unchanged on repeat - proBNP 229 - No evidence of pulmonary edema or vascular congestion on imaging - Continue home dose spironolactone .  IV Lasix  twice daily for now - Monitor I's and O's - Continue home GDMT, monitor BPs closely  Paroxysmal A-fib - Continue home dose amiodarone  and Eliquis   Hypertension - Resume home meds, titrate as needed  Anxiety Restless leg syndrome - Resume home meds  Hypothyroidism - Continue home dose Synthroid   Antiphospholipid syndrome - On Eliquis   at home.  Continue  Body mass index is 28.27 kg/m. - Outpatient follow up for lifestyle modification and risk factor management  Iron deficiency anemia - Iron 22, TSAT 5 -TIBC, ferritin, folate all WNL - Start ferrous sulfate  daily - Hemoglobin baseline around 9.  Currently stable consistent with prior  L1 Compression Fracture -  Incidentally seen on chest CTA.  Patient denies any acute pain or acute trauma.  She does endorse chronic low back pain. - We offered TLSO brace but she refuses to wear. - Continue fall precautions - PT/OT aware  DVT prophylaxis: Eliquis    Code Status: Full Code Disposition:  Inpatient pending clinical resolution, hopefully DC tomorrow if stable. HH ordered  Consultants:    Procedures:    Antimicrobials:  Anti-infectives (From admission, onward)    Start     Dose/Rate Route Frequency Ordered Stop   11/01/24 2230  azithromycin  (ZITHROMAX ) 500 mg in sodium chloride  0.9 % 250 mL IVPB        500 mg 250 mL/hr over 60 Minutes Intravenous Every 24 hours 11/01/24 2134         Data Reviewed: I have personally reviewed following labs and imaging studies CBC: Recent Labs  Lab 11/01/24 1440 11/02/24 0441  WBC 5.3 5.6  HGB 10.2* 10.1*  HCT 32.9* 32.7*  MCV 77.0* 78.0*  PLT 371 358   Basic Metabolic Panel: Recent Labs  Lab 11/01/24 1440 11/01/24 1613 11/02/24 0441  NA 134*  --  135  K 2.9*  --  3.2*  CL 94*  --  95*  CO2 27  --  27  GLUCOSE 99  --  151*  BUN 10  --  10  CREATININE 1.00  --  0.97  CALCIUM  9.6  --  9.6  MG  --  2.0  --    GFR: Estimated Creatinine Clearance: 43.4 mL/min (by C-G formula based on SCr of 0.97 mg/dL). Liver Function Tests: No results for input(s): AST, ALT, ALKPHOS, BILITOT, PROT, ALBUMIN in the last 168 hours. CBG: No results for input(s): GLUCAP in the last 168 hours.  Recent Results (from the past 240 hours)  Respiratory (~20 pathogens) panel by PCR     Status: None   Collection Time: 11/01/24 10:12 PM   Specimen: Nasopharyngeal Swab; Respiratory  Result Value Ref Range Status   Adenovirus NOT DETECTED NOT DETECTED Final   Coronavirus 229E NOT DETECTED NOT DETECTED Final    Comment: (NOTE) The Coronavirus on the Respiratory Panel, DOES NOT test for the novel  Coronavirus (2019 nCoV)    Coronavirus HKU1 NOT DETECTED  NOT DETECTED Final   Coronavirus NL63 NOT DETECTED NOT DETECTED Final   Coronavirus OC43 NOT DETECTED NOT DETECTED Final   Metapneumovirus NOT DETECTED NOT DETECTED Final   Rhinovirus / Enterovirus NOT DETECTED NOT DETECTED Final   Influenza A NOT DETECTED NOT DETECTED Final   Influenza B NOT DETECTED NOT DETECTED Final   Parainfluenza Virus 1 NOT DETECTED NOT DETECTED Final   Parainfluenza Virus 2 NOT DETECTED NOT DETECTED Final   Parainfluenza Virus 3 NOT DETECTED NOT DETECTED Final   Parainfluenza Virus 4 NOT DETECTED NOT DETECTED Final   Respiratory Syncytial Virus NOT DETECTED NOT DETECTED Final   Bordetella pertussis NOT DETECTED NOT DETECTED Final   Bordetella Parapertussis NOT DETECTED NOT DETECTED Final   Chlamydophila pneumoniae NOT DETECTED NOT DETECTED Final   Mycoplasma pneumoniae NOT DETECTED NOT DETECTED Final    Comment: Performed at [facility], [address] [place], [place] [address]  Resp panel by RT-PCR (RSV, Flu A&B, Covid) Anterior Nasal Swab     Status: None   Collection Time: 11/01/24 10:12 PM   Specimen: Anterior Nasal Swab  Result Value Ref Range Status   SARS Coronavirus 2 by RT PCR NEGATIVE NEGATIVE Final    Comment: (NOTE) SARS-CoV-2 target nucleic acids are NOT DETECTED.  The SARS-CoV-2 RNA is generally detectable in upper respiratory specimens during the acute phase of infection. The lowest concentration of SARS-CoV-2 viral copies this assay can detect is 138 copies/mL. A negative result does not preclude SARS-Cov-2 infection and should not be used as the sole basis for treatment or other patient management decisions. A negative result may occur with  improper specimen collection/handling, submission of specimen other than nasopharyngeal swab, presence of viral mutation(s) within the areas targeted by this assay, and inadequate number of viral copies(<138 copies/mL). A negative result must be combined with clinical observations, patient  history, and epidemiological information. The expected result is Negative.  Fact Sheet for Patients:  bloggercourse.com  Fact Sheet for Healthcare Providers:  seriousbroker.it  This test is no t yet approved or cleared by the [place]  FDA and  has been authorized for detection and/or diagnosis of SARS-CoV-2 by FDA under an Emergency Use Authorization (EUA). This EUA will remain  in effect (meaning this test can be used) for the duration of the COVID-19 declaration under Section 564(b)(1) of the Act, 21 U.S.C.section 360bbb-3(b)(1), unless the authorization is terminated  or revoked sooner.       Influenza A by PCR NEGATIVE NEGATIVE Final   Influenza B by PCR NEGATIVE NEGATIVE Final    Comment: (NOTE) The Xpert Xpress SARS-CoV-2/FLU/RSV plus assay is intended as an aid in the diagnosis of influenza from Nasopharyngeal swab specimens and should not be used as a sole basis for treatment. Nasal washings and aspirates are unacceptable for Xpert Xpress SARS-CoV-2/FLU/RSV testing.  Fact Sheet for Patients: bloggercourse.com  Fact Sheet for Healthcare Providers: seriousbroker.it  This test is not yet approved or cleared by the [place]  FDA and has been authorized for detection and/or diagnosis of SARS-CoV-2 by FDA under an Emergency Use Authorization (EUA). This EUA will remain in effect (meaning this test can be used) for the duration of the COVID-19 declaration under Section 564(b)(1) of the Act, 21 U.S.C. section 360bbb-3(b)(1), unless the authorization is terminated or revoked.     Resp Syncytial Virus by PCR NEGATIVE NEGATIVE Final    Comment: (NOTE) Fact Sheet for Patients: bloggercourse.com  Fact Sheet for Healthcare Providers: seriousbroker.it  This test is not yet approved or cleared by the [place]  FDA  and has been authorized for detection and/or diagnosis of SARS-CoV-2 by FDA under an Emergency Use Authorization (EUA). This EUA will remain in effect (meaning this test can be used) for the duration of the COVID-19 declaration under Section 564(b)(1) of the Act, 21 U.S.C. section 360bbb-3(b)(1), unless the authorization is terminated or revoked.  Performed at [facility], [address] [place], [place] [address]      Radiology Studies: CT Angio Chest PE W and/or Wo Contrast Result Date: 11/01/2024 EXAM: CTA CHEST 11/01/2024 06:58:18 PM TECHNIQUE: CTA of the chest was performed with the administration of 75 mL of iohexol  (OMNIPAQUE ) 350 MG/ML injection. Multiplanar reformatted images are provided for review. MIP images are provided for review. Automated exposure control, iterative reconstruction, and/or weight based adjustment of the mA/kV was utilized to reduce the radiation dose to as  low as reasonably achievable. COMPARISON: Same day x-ray and CT chest 23/25. CLINICAL HISTORY: Pulmonary embolism (PE) suspected, low to intermediate prob, positive D-dimer. Chest pain and shortness of breath. FINDINGS: PULMONARY ARTERIES: Pulmonary arteries are adequately opacified for evaluation. No acute pulmonary embolus. Main pulmonary artery is normal in caliber. MEDIASTINUM: The heart and pericardium demonstrate no acute abnormality. Coronary artery and aortic atherosclerotic calcifications. There is no acute abnormality of the thoracic aorta. LYMPH NODES: No mediastinal, hilar or axillary lymphadenopathy. LUNGS AND PLEURA: Emphysema. Mild bronchial wall thickening and mucus plugging in the lower lobes. Scarring / atelectasis in the lower lungs. No focal consolidation or pulmonary edema. No evidence of pleural effusion or pneumothorax. UPPER ABDOMEN: Large hiatal hernia. Limited images of the upper abdomen are otherwise unremarkable. SOFT TISSUES AND BONES: Superior endplate compression fracture of  L1, new since MRI 07/10/2024. There is approximately 25% vertebral body height loss and 4 mm of retropulsion of the superior endplate. No acute soft tissue abnormality. IMPRESSION: 1. No pulmonary embolism. 2. New superior endplate compression fracture of L1 with approximately 25% vertebral body height loss and 4 mm of retropulsion of the superior endplate. 3. Pulmonary emphysema and chronic bronchitis. Electronically signed by: Norman Gatlin MD 11/01/2024 07:54 PM EST RP Workstation: [id number]   DG Chest 2 View Result Date: 11/01/2024 CLINICAL DATA:  Shortness of breath and chest pain. EXAM: CHEST - 2 VIEW COMPARISON:  07/27/2024 and CT chest 04/23/2024. FINDINGS: Trachea is midline. Heart is enlarged, stable. Aortic valve replacement. No airspace consolidation or pleural fluid. Minimal streaky scarring in the lung bases. Degenerative changes in the spine. IMPRESSION: No acute findings. Electronically Signed   By: Newell Eke M.D.   On: 11/01/2024 16:02    Scheduled Meds:  amiodarone   200 mg Oral Daily   apixaban   5 mg Oral BID   atorvastatin   40 mg Oral Daily   budesonide  (PULMICORT ) nebulizer solution  2 mg Nebulization Q12H   dapagliflozin  propanediol  10 mg Oral QAC breakfast   furosemide   40 mg Intravenous BID   guaiFENesin   1,200 mg Oral BID   Influenza vac split trivalent PF  0.5 mL Intramuscular Tomorrow-1000   ipratropium-albuterol   3 mL Nebulization Q6H   irbesartan  75 mg Oral Daily   isosorbide  mononitrate  30 mg Oral Daily   levothyroxine   75 mcg Oral QAC breakfast   methylPREDNISolone  (SOLU-MEDROL ) injection  40 mg Intravenous Daily   pantoprazole   40 mg Oral Daily   rOPINIRole   0.25 mg Oral QHS   spironolactone   25 mg Oral Daily   sucralfate   1 g Oral TID WC & HS   Continuous Infusions:  azithromycin  500 mg (11/01/24 2211)     LOS: 0 days  MDM: Patient is high risk for one or more organ failure.  They necessitate ongoing hospitalization for continued IV therapies  and subsequent lab monitoring. Total time spent interpreting labs and vitals, reviewing the medical record, coordinating care amongst consultants and care team members, directly assessing and discussing care with the patient and/or family: 55 min  Teressa Mcglocklin, DO [facility]  To contact the attending physician between 7A-7P please use Epic Chat. To contact the covering physician during after hours 7P-7A, please review Amion.  11/02/2024, 7:59 AM   *This document has been created with the assistance of dictation software. Please excuse typographical errors. *

## 2024-11-02 NOTE — Evaluation (Signed)
 Occupational Therapy Evaluation Patient Details Name: Linda Brady MRN: [id number] DOB: May 19, 1941 Today's Date: 11/02/2024   History of Present Illness   Pt is a 83 y.o. female admitted for COPD exacerbation and chronic hypoxic respiratory failure, HFpEF mild exacerbation. CTA showed new compression fracture of L1, per MD pt can wear TLSO for comfort if desired. 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 Prior to admission, pt lives alone with daughter providing intermittent assist for transportation/meals/med mgmt. Pt uses RW for mobility and is able to perform ADLs mod independently. Pt received sitting upright at EOB. Discussed use of TLSO with pt for improved back pain / comfort with mobility - pt politely declines brace, stating she is claustrophobic and does not feel brace will help. Pt edu on spinal precautions for comfort, and is able to perform functional STS transfers + mobility using RW with CGA. Pt completes LB dressing while seated on BSC with supervision, good adherence to spinal precautions after taught education. VSS on baseline 2L O2. Pt appears to be close to baseline level of ADL performance, anticipate pt will progress to discharge home with [facility] OT. OT will follow acutely.      If plan is discharge home, recommend the following:   A little help with walking and/or transfers;A little help with bathing/dressing/bathroom;Assist for transportation;Help with stairs or ramp for entrance     Functional Status Assessment   Patient has had a recent decline in their functional status and demonstrates the ability to make significant improvements in function in a reasonable and predictable amount of time.     Equipment Recommendations   BSC/3in1      Precautions/Restrictions    Precautions Precautions: Back;Fall Recall of Precautions/Restrictions: Impaired Precaution/Restrictions Comments: discussed use of spinal precautions for comfort with pt. Discussed with MD Dezii via secure chat - pt can wear TLSO if desired. Pt offered TLSO for comfort during mobility (pt refuses due to claustrophobia and reports current back pain is chronic). MD updated and aware. Required Braces or Orthoses: Spinal Brace Spinal Brace: Thoracolumbosacral orthotic;Other (comment) (use for comfort, pt declines) Restrictions Weight Bearing Restrictions Per Provider Order: No     Mobility Bed Mobility Overal bed mobility: Needs Assistance             General bed mobility comments: NT, pt recieved sitting up at EOB start of session.    Transfers Overall transfer level: Needs assistance Equipment used: Rolling walker (2 wheels) Transfers: Sit to/from Stand, Bed to chair/wheelchair/BSC Sit to Stand: Contact guard assist     Step pivot transfers: Contact guard assist     General transfer comment: multiple STS transfers completed, CGA for step pivot t/f BSC      Balance Overall balance assessment: Needs assistance Sitting-balance support: No upper extremity supported, Feet supported Sitting balance-Leahy Scale: Good     Standing balance support: Bilateral upper extremity supported, During functional activity Standing balance-Leahy Scale: Fair Standing balance comment: fair- with dynamic challenges                           ADL either performed or assessed with clinical judgement   ADL Overall ADL's : Needs assistance/impaired                     Lower Body Dressing: Adhering  to hip precautions;Cueing for back precautions;Supervision/safety;Sit to/from stand Lower Body Dressing Details (indicate cue type and reason): dons underwear using seated figure four, pt able to follow directions and adhere to back precautions for comfort. pulls underwear over  hips in standing. Toilet Transfer: BSC/3in1;Cueing for sequencing;Cueing for safety;Rolling walker (2 wheels);Contact guard assist Toilet Transfer Details (indicate cue type and reason): pt recieved sitting EOB, able to transfer with CGA using RW t/f BSC. good hand placement and control to sit and stand. OT edu on spinal precautions for comfort Toileting- Clothing Manipulation and Hygiene: Supervision/safety;Sitting/lateral lean;Cueing for back precautions       Functional mobility during ADLs: Contact guard assist;Rolling walker (2 wheels) General ADL Comments: anticipate pt close to baseline for ADL performance. able to complete mobility around bed to recliner with CGA, mostly sedentary at home.     Vision Baseline Vision/History: 0 No visual deficits              Pertinent Vitals/Pain Pain Assessment Pain Assessment: No/denies pain     Extremity/Trunk Assessment Upper Extremity Assessment Upper Extremity Assessment: Generalized weakness;Right hand dominant   Lower Extremity Assessment Lower Extremity Assessment: Generalized weakness   Cervical / Trunk Assessment Cervical / Trunk Assessment: Normal   Communication Communication Communication: Impaired Factors Affecting Communication: Hearing impaired   Cognition Arousal: Alert Behavior During Therapy: WFL for tasks assessed/performed Cognition: No apparent impairments                               Following commands: Intact       Cueing  General Comments   Cueing Techniques: Verbal cues  VSS on baseline 2L O2. Discussed TLSO with RN - pt declining use and does not need delivered to room. Pt does not report any increased pain with mobility, states she has chronic LBP   Exercises     Shoulder Instructions      Home Living Family/patient expects to be discharged to:: Private residence Living Arrangements: Alone Available Help at Discharge: Family;Available PRN/intermittently (daughter does  driving, meds) Type of Home: House Home Access: Stairs to enter [employer] of Steps: 4 Entrance Stairs-Rails: Right Home Layout: One level     Bathroom Shower/Tub: Producer, Television/film/video: Standard     Home Equipment: Shower seat - built Charity Fundraiser (2 wheels);Grab bars - tub/shower          Prior Functioning/Environment Prior Level of Function : Independent/Modified Independent             Mobility Comments: uses RW due to chronic LBP ADLs Comments: ind with ADLs. Daughter drives her to/from appointments and does grocery shopping    OT Problem List: Cardiopulmonary status limiting activity;Decreased activity tolerance;Impaired balance (sitting and/or standing);Decreased strength;Decreased safety awareness;Decreased knowledge of use of DME or AE;Decreased knowledge of precautions   OT Treatment/Interventions: Self-care/ADL training;Therapeutic exercise;Energy conservation;DME and/or AE instruction;Therapeutic activities;Patient/family education;Balance training      OT Goals(Current goals can be found in the care plan section)   Acute Rehab OT Goals OT Goal Formulation: With patient Time For Goal Achievement: 11/16/24 Potential to Achieve Goals: Good   OT Frequency:  Min 2X/week       AM-PAC OT 6 Clicks Daily Activity     Outcome Measure Help from another person eating meals?: None Help from another person taking care of personal grooming?: None Help from another person toileting, which includes using toliet, bedpan, or urinal?: A Little Help  from another person bathing (including washing, rinsing, drying)?: A Little Help from another person to put on and taking off regular upper body clothing?: None Help from another person to put on and taking off regular lower body clothing?: A Little 6 Click Score: 21   End of Session Equipment Utilized During Treatment: Gait belt;Rolling walker (2 wheels);Oxygen Nurse Communication: Mobility  status  Activity Tolerance: Patient tolerated treatment well;No increased pain Patient left: in chair;with call bell/phone within reach;with chair alarm set  OT Visit Diagnosis: Muscle weakness (generalized) (M62.81);Unsteadiness on feet (R26.81)                Time: 8980-8955 OT Time Calculation (min): 25 min Charges:  OT General Charges $OT Visit: 1 Visit OT Evaluation $OT Eval Low Complexity: 1 Low OT Treatments $Self Care/Home Management : 8-22 mins  Creedence Kunesh L. Kewan Mcnease, OTR/L  11/02/24, 1:01 PM

## 2024-11-02 NOTE — Plan of Care (Signed)

## 2024-11-03 DIAGNOSIS — J9611 Chronic respiratory failure with hypoxia: Secondary | ICD-10-CM | POA: Diagnosis not present

## 2024-11-03 DIAGNOSIS — J441 Chronic obstructive pulmonary disease with (acute) exacerbation: Secondary | ICD-10-CM | POA: Diagnosis not present

## 2024-11-03 LAB — CBC WITH DIFFERENTIAL/PLATELET
Abs Immature Granulocytes: 0.03 K/uL (ref 0.00–0.07)
Basophils Absolute: 0 K/uL (ref 0.0–0.1)
Basophils Relative: 0 %
Eosinophils Absolute: 0 K/uL (ref 0.0–0.5)
Eosinophils Relative: 0 %
HCT: 31.9 % — ABNORMAL LOW (ref 36.0–46.0)
Hemoglobin: 10.4 g/dL — ABNORMAL LOW (ref 12.0–15.0)
Immature Granulocytes: 0 %
Lymphocytes Relative: 12 %
Lymphs Abs: 1 K/uL (ref 0.7–4.0)
MCH: 25.7 pg — ABNORMAL LOW (ref 26.0–34.0)
MCHC: 32.6 g/dL (ref 30.0–36.0)
MCV: 79 fL — ABNORMAL LOW (ref 80.0–100.0)
Monocytes Absolute: 1.1 K/uL — ABNORMAL HIGH (ref 0.1–1.0)
Monocytes Relative: 13 %
Neutro Abs: 6.3 K/uL (ref 1.7–7.7)
Neutrophils Relative %: 75 %
Platelets: 346 K/uL (ref 150–400)
RBC: 4.04 MIL/uL (ref 3.87–5.11)
RDW: 19.2 % — ABNORMAL HIGH (ref 11.5–15.5)
WBC: 8.5 K/uL (ref 4.0–10.5)
nRBC: 0 % (ref 0.0–0.2)

## 2024-11-03 LAB — PHOSPHORUS: Phosphorus: 3.1 mg/dL (ref 2.5–4.6)

## 2024-11-03 LAB — COMPREHENSIVE METABOLIC PANEL WITH GFR
ALT: 25 U/L (ref 0–44)
AST: 32 U/L (ref 15–41)
Albumin: 4 g/dL (ref 3.5–5.0)
Alkaline Phosphatase: 103 U/L (ref 38–126)
Anion gap: 12 (ref 5–15)
BUN: 16 mg/dL (ref 8–23)
CO2: 27 mmol/L (ref 22–32)
Calcium: 9.6 mg/dL (ref 8.9–10.3)
Chloride: 93 mmol/L — ABNORMAL LOW (ref 98–111)
Creatinine, Ser: 1.04 mg/dL — ABNORMAL HIGH (ref 0.44–1.00)
GFR, Estimated: 54 mL/min — ABNORMAL LOW (ref >60)
Glucose, Bld: 100 mg/dL — ABNORMAL HIGH (ref 70–99)
Potassium: 3.3 mmol/L — ABNORMAL LOW (ref 3.5–5.1)
Sodium: 131 mmol/L — ABNORMAL LOW (ref 135–145)
Total Bilirubin: 0.5 mg/dL (ref 0.0–1.2)
Total Protein: 7.1 g/dL (ref 6.5–8.1)

## 2024-11-03 LAB — MAGNESIUM: Magnesium: 2.5 mg/dL — ABNORMAL HIGH (ref 1.7–2.4)

## 2024-11-03 MED ORDER — POTASSIUM CHLORIDE CRYS ER 20 MEQ PO TBCR
40.0000 meq | EXTENDED_RELEASE_TABLET | Freq: Two times a day (BID) | ORAL | Status: DC
  Administered 2024-11-03: 40 meq via ORAL
  Filled 2024-11-03: qty 2

## 2024-11-03 MED ORDER — ACETAMINOPHEN 500 MG PO TABS: 500.0000 mg | ORAL_TABLET | Freq: Four times a day (QID) | ORAL | 0 refills | Status: AC | PRN

## 2024-11-03 MED ORDER — FERROUS SULFATE 325 (65 FE) MG PO TABS: 325.0000 mg | ORAL_TABLET | Freq: Every day | ORAL | 0 refills | Status: AC

## 2024-11-03 NOTE — TOC Transition Note (Addendum)
 Transition of Care [facility]) - Discharge Note   Patient Details  Name: Linda Brady MRN: [id number] Date of Birth: 11-05-1941  [facility] CM/SW Contact:  Dalia GORMAN Fuse, RN Phone Number: 11/03/2024, 11:03 AM   Clinical Narrative:     Patient is medically clear to discharge to home with [facility] PT/OT. Referral for [facility] sent in the hub, [place] accepted. No other TOC needs at this time.   Final next level of care: Home w Home Health Services Barriers to Discharge: Barriers Resolved   Patient Goals and CMS Choice            Discharge Placement                       Discharge Plan and Services Additional resources added to the After Visit Summary for                            [facility] Arranged: OT, PT          Social Drivers of Health (SDOH) Interventions SDOH Screenings   Food Insecurity: No Food Insecurity (11/01/2024)  Housing: Low Risk  (11/01/2024)  Transportation Needs: No Transportation Needs (11/01/2024)  Utilities: Not At Risk (11/01/2024)  Financial Resource Strain: Low Risk  (08/25/2024)   Received from [facility]  Social Connections: Unknown (11/01/2024)  Tobacco Use: Medium Risk (11/01/2024)     Readmission Risk Interventions    05/20/2024    4:06 PM 10/28/2023    2:54 PM  Readmission Risk Prevention Plan  Transportation Screening Complete Complete  PCP or Specialist Appt within 5-7 Days  Complete  PCP or Specialist Appt within 3-5 Days Complete   Home Care Screening  Complete  Medication Review (RN CM)  Complete  HRI or Home Care Consult Complete   Social Work Consult for Recovery Care Planning/Counseling Complete   Palliative Care Screening Not Applicable   Medication Review Oceanographer) Complete

## 2024-11-03 NOTE — Discharge Summary (Signed)
 Physician Discharge Summary  Patient: Linda Brady FMW:[id number] DOB: January 28, 1941   Code Status: Full Code Admit date: 11/01/2024 Discharge date: 11/03/2024 Disposition: Home, No home health services recommended PCP: Valora Lynwood FALCON, MD  Recommendations for Outpatient Follow-up:  Follow up with PCP within 1-2 weeks Regarding general hospital follow up and preventative care  Discharge Diagnoses:  Principal Problem:   COPD exacerbation (HCC) Active Problems:   Chronic heart failure with preserved ejection fraction (HFpEF) (HCC)   Chronic respiratory failure with hypoxia [facility])  Brief Hospital Course Summary: Linda Brady is a 83 y.o. 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  1 week prior to arrival she was diagnosed with pneumonia and has been on antibiotics.  She denies any improvement since initiation. In the ED she was maintaining O2 sats 100% on her home O2 of 2L but did have increased work of breathing.  CT negative for PE, pulmonary edema, or pneumonia.  Some bronchial wall thickening and mucous plugging in lower lobes.  Symptoms were refractory to steroids and nebulizers and thus the patient was admitted for further management of a COPD exacerbation.  She was able to remain on her home baseline 2Lnc at rest but had significant dyspnea and tachycardia with minimal exertion.  She continued on treatment of steroids, bronchodilator breathing treatments and antibiotics during her stay and had improvement closer to baseline.  With ambulatory pulse-ox, she remained >93% on home O2 and had improvement in her dyspnea. She was evaluated by PT/OT and home health was recommended.   All other chronic conditions were treated with  home medications.    Discharge Condition: Stable, improved Recommended discharge diet: Regular healthy diet  Consultations: None   Procedures/Studies: None   Allergies as of 11/03/2024       Reactions   Naproxen Sodium Anaphylaxis        Medication List     TAKE these medications    acetaminophen  500 MG tablet Commonly known as: TYLENOL  Take 1 tablet (500 mg total) by mouth every 6 (six) hours as needed for mild pain (pain score 1-3), fever or moderate pain (pain score 4-6) (or Fever >/= 101).   ALPRAZolam  0.25 MG tablet Commonly known as: XANAX  Take 0.25 mg by mouth at bedtime as needed for sleep.   amiodarone  200 MG tablet Commonly known as: PACERONE  Take 1 tablet (200 mg total) by mouth daily.   atorvastatin  40 MG tablet Commonly known as: LIPITOR Take 1 tablet (40 mg total) by mouth daily.   cholecalciferol  25 MCG (1000 UNIT) tablet Commonly known as: VITAMIN D3 Take 1,000 Units by mouth daily.   Combivent  Respimat 20-100 MCG/ACT Aers respimat Generic drug: Ipratropium-Albuterol  Inhale 1 puff into the lungs every 6 (six) hours as needed for wheezing.   cyanocobalamin  1000 MCG tablet Commonly known as: VITAMIN B12 Take 1,000 mcg by mouth daily.   dapagliflozin  propanediol 10 MG Tabs tablet Commonly known as: Farxiga  Take 1 tablet (10 mg total) by mouth daily before breakfast.   Eliquis  5 MG Tabs tablet Generic drug: apixaban  Take 5 mg by mouth 2 (two) times daily.   ferrous sulfate  325 (65 FE) MG tablet Take 1 tablet (325 mg total) by mouth daily with breakfast. Start taking on: November 04, 2024   furosemide   20 MG tablet Commonly known as: LASIX  Take 10 mg by mouth daily.   isosorbide  mononitrate 30 MG 24 hr tablet Commonly known as: IMDUR  Take 30 mg by mouth daily.   levalbuterol  1.25 MG/3ML nebulizer solution Commonly known as: XOPENEX  Take 1.25 mg by nebulization every 6 (six) hours as needed.   levothyroxine  75 MCG tablet Commonly  known as: SYNTHROID  Take 75 mcg by mouth daily before breakfast.   lidocaine  5 % Commonly known as: LIDODERM  Place 1 patch onto the skin daily.   mometasone -formoterol  200-5 MCG/ACT Aero Commonly known as: DULERA  Inhale 2 puffs into the lungs 2 (two) times daily.   montelukast  10 MG tablet Commonly known as: SINGULAIR  Take 1 tablet by mouth at bedtime.   pantoprazole  40 MG tablet Commonly known as: PROTONIX  Take 40 mg by mouth daily.   potassium chloride  SA 20 MEQ tablet Commonly known as: KLOR-CON  M Take 2 tablets (40 mEq total) by mouth daily.   rOPINIRole  0.25 MG tablet Commonly known as: REQUIP  Take 1 tablet (0.25 mg total) by mouth at bedtime.   spironolactone  25 MG tablet Commonly known as: ALDACTONE  Take 1 tablet (25 mg total) by mouth daily.   sucralfate  1 g tablet Commonly known as: CARAFATE  Take 1 g by mouth 4 (four) times daily -  with meals and at bedtime.   valsartan  40 MG tablet Commonly known as: DIOVAN  Take 0.5 tablets (20 mg total) by mouth daily.        Follow-up Information     Valora Lynwood FALCON, MD Follow up.   Specialty: Family Medicine Why: hospital follow up Contact information: [address] [facility] [place] [address] [phone]                 Subjective   Pt reports feeling improved. Cough is resolving. No shortness of breath at rest. Still some mild shortness of breath and increased heart rate with walking in the room which is tolerable.   All questions and concerns were addressed at time of discharge.  Objective  Blood pressure 137/61, pulse 71, temperature (!) 97.5 F (36.4 C), resp. rate 19, weight 72.4 kg, SpO2 100%.   General: Pt is alert, awake, not in acute distress Cardiovascular: RRR, S1/S2 +, no rubs, no gallops Respiratory: CTA bilaterally, no wheezing, no rhonchi Abdominal: Soft, NT, ND, bowel sounds + Extremities: no edema, no cyanosis  The results of significant diagnostics from this  hospitalization (including imaging, microbiology, ancillary and laboratory) are listed below for reference.   Imaging studies: CT Angio Chest PE W and/or Wo Contrast Result Date: 11/01/2024 EXAM: CTA CHEST 11/01/2024 06:58:18 PM TECHNIQUE: CTA of the chest was performed with the administration of 75 mL of iohexol  (OMNIPAQUE ) 350 MG/ML injection. Multiplanar reformatted images are provided for review. MIP images are provided for review. Automated exposure control, iterative reconstruction, and/or weight based adjustment of the mA/kV was utilized to reduce the radiation dose to as low as reasonably achievable. COMPARISON: Same day x-ray and CT chest 23/25. CLINICAL HISTORY: Pulmonary embolism (PE) suspected, low to intermediate prob, positive D-dimer. Chest pain and shortness of breath. FINDINGS: PULMONARY ARTERIES: Pulmonary arteries are adequately opacified for evaluation. No acute pulmonary embolus. Main pulmonary artery is normal in caliber. MEDIASTINUM: The heart and pericardium demonstrate no acute abnormality. Coronary artery and aortic atherosclerotic calcifications. There is no acute abnormality of the thoracic aorta. LYMPH NODES: No mediastinal, hilar or axillary lymphadenopathy. LUNGS AND PLEURA: Emphysema. Mild bronchial wall thickening and mucus plugging  in the lower lobes. Scarring / atelectasis in the lower lungs. No focal consolidation or pulmonary edema. No evidence of pleural effusion or pneumothorax. UPPER ABDOMEN: Large hiatal hernia. Limited images of the upper abdomen are otherwise unremarkable. SOFT TISSUES AND BONES: Superior endplate compression fracture of L1, new since MRI 07/10/2024. There is approximately 25% vertebral body height loss and 4 mm of retropulsion of the superior endplate. No acute soft tissue abnormality. IMPRESSION: 1. No pulmonary embolism. 2. New superior endplate compression fracture of L1 with approximately 25% vertebral body height loss and 4 mm of retropulsion of  the superior endplate. 3. Pulmonary emphysema and chronic bronchitis. Electronically signed by: Norman Gatlin MD 11/01/2024 07:54 PM EST RP Workstation: [id number]   DG Chest 2 View Result Date: 11/01/2024 CLINICAL DATA:  Shortness of breath and chest pain. EXAM: CHEST - 2 VIEW COMPARISON:  07/27/2024 and CT chest 04/23/2024. FINDINGS: Trachea is midline. Heart is enlarged, stable. Aortic valve replacement. No airspace consolidation or pleural fluid. Minimal streaky scarring in the lung bases. Degenerative changes in the spine. IMPRESSION: No acute findings. Electronically Signed   By: Newell Eke M.D.   On: 11/01/2024 16:02   VAS US  UPPER EXTREMITY ARTERIAL DUPLEX Result Date: 10/05/2024  UPPER EXTREMITY DUPLEX STUDY Patient Name:  JOYANN SPIDLE  Date of Exam:   10/05/2024 Medical Rec #: [id number]          Accession #:    [id number] Date of Birth: 1941-06-24         Patient Gender: F Patient Age:   40 years Exam Location:  [address] Procedure:      VAS US  UPPER EXTREMITY ARTERIAL DUPLEX Referring Phys: DEBBY ROBERTSON --------------------------------------------------------------------------------  Indications: Wrist pain.  Risk Factors:  Hypertension. Other Factors: Patient has a history of 10/24/23: Right axillary stent for                pseudoaneurysm repair. Comparison Study: 11/19/23 Performing Technologist: Garnette Rockers  Examination Guidelines: A complete evaluation includes B-mode imaging, spectral Doppler, color Doppler, and power Doppler as needed of all accessible portions of each vessel. Bilateral testing is considered an integral part of a complete examination. Limited examinations for reoccurring indications may be performed as noted.  Right Doppler Findings: +---------------+----------+---------+--------+--------+ Site           PSV (cm/s)Waveform StenosisComments +---------------+----------+---------+--------+--------+ Subclavian Prox155       triphasic                  +---------------+----------+---------+--------+--------+ Subclavian Mid 105       triphasic                 +---------------+----------+---------+--------+--------+ Subclavian Dist66        triphasic                 +---------------+----------+---------+--------+--------+ Axillary       69        triphasic                 +---------------+----------+---------+--------+--------+ Brachial Prox  232       triphasic        Tortuous +---------------+----------+---------+--------+--------+ Brachial Mid   160       triphasic                 +---------------+----------+---------+--------+--------+ Brachial Dist  114       triphasic                 +---------------+----------+---------+--------+--------+ Radial Dist    88  triphasic                 +---------------+----------+---------+--------+--------+ Ulnar Dist     33        triphasic                 +---------------+----------+---------+--------+--------+    Summary:  Right: No obstruction visualized in the right upper extremity No        signs of brachial dissection or any other arterial disease        seen in right upper extremity. *See table(s) above for measurements and observations. Electronically signed by Debby Robertson on 10/05/2024 at 4:47:48 PM.    Final     Labs: Basic Metabolic Panel: Recent Labs  Lab 11/01/24 1440 11/01/24 1613 11/02/24 0441 11/03/24 0603  NA 134*  --  135 131*  K 2.9*  --  3.2* 3.3*  CL 94*  --  95* 93*  CO2 27  --  27 27  GLUCOSE 99  --  151* 100*  BUN 10  --  10 16  CREATININE 1.00  --  0.97 1.04*  CALCIUM  9.6  --  9.6 9.6  MG  --  2.0  --  2.5*  PHOS  --   --   --  3.1   CBC: Recent Labs  Lab 11/01/24 1440 11/02/24 0441 11/03/24 0603  WBC 5.3 5.6 8.5  NEUTROABS  --   --  6.3  HGB 10.2* 10.1* 10.4*  HCT 32.9* 32.7* 31.9*  MCV 77.0* 78.0* 79.0*  PLT 371 358 346   Microbiology: Results for orders placed or performed during the hospital encounter of  11/01/24  Respiratory (~20 pathogens) panel by PCR     Status: None   Collection Time: 11/01/24 10:12 PM   Specimen: Nasopharyngeal Swab; Respiratory  Result Value Ref Range Status   Adenovirus NOT DETECTED NOT DETECTED Final   Coronavirus 229E NOT DETECTED NOT DETECTED Final    Comment: (NOTE) The Coronavirus on the Respiratory Panel, DOES NOT test for the novel  Coronavirus (2019 nCoV)    Coronavirus HKU1 NOT DETECTED NOT DETECTED Final   Coronavirus NL63 NOT DETECTED NOT DETECTED Final   Coronavirus OC43 NOT DETECTED NOT DETECTED Final   Metapneumovirus NOT DETECTED NOT DETECTED Final   Rhinovirus / Enterovirus NOT DETECTED NOT DETECTED Final   Influenza A NOT DETECTED NOT DETECTED Final   Influenza B NOT DETECTED NOT DETECTED Final   Parainfluenza Virus 1 NOT DETECTED NOT DETECTED Final   Parainfluenza Virus 2 NOT DETECTED NOT DETECTED Final   Parainfluenza Virus 3 NOT DETECTED NOT DETECTED Final   Parainfluenza Virus 4 NOT DETECTED NOT DETECTED Final   Respiratory Syncytial Virus NOT DETECTED NOT DETECTED Final   Bordetella pertussis NOT DETECTED NOT DETECTED Final   Bordetella Parapertussis NOT DETECTED NOT DETECTED Final   Chlamydophila pneumoniae NOT DETECTED NOT DETECTED Final   Mycoplasma pneumoniae NOT DETECTED NOT DETECTED Final    Comment: Performed at [facility], [address] [place], [place] [address]  Resp panel by RT-PCR (RSV, Flu A&B, Covid) Anterior Nasal Swab     Status: None   Collection Time: 11/01/24 10:12 PM   Specimen: Anterior Nasal Swab  Result Value Ref Range Status   SARS Coronavirus 2 by RT PCR NEGATIVE NEGATIVE Final    Comment: (NOTE) SARS-CoV-2 target nucleic acids are NOT DETECTED.  The SARS-CoV-2 RNA is generally detectable in upper respiratory specimens during the acute phase of infection. The lowest concentration  of SARS-CoV-2 viral copies this assay can detect is 138 copies/mL. A negative result does not preclude  SARS-Cov-2 infection and should not be used as the sole basis for treatment or other patient management decisions. A negative result may occur with  improper specimen collection/handling, submission of specimen other than nasopharyngeal swab, presence of viral mutation(s) within the areas targeted by this assay, and inadequate number of viral copies(<138 copies/mL). A negative result must be combined with clinical observations, patient history, and epidemiological information. The expected result is Negative.  Fact Sheet for Patients:  bloggercourse.com  Fact Sheet for Healthcare Providers:  seriousbroker.it  This test is no t yet approved or cleared by the [place]  FDA and  has been authorized for detection and/or diagnosis of SARS-CoV-2 by FDA under an Emergency Use Authorization (EUA). This EUA will remain  in effect (meaning this test can be used) for the duration of the COVID-19 declaration under Section 564(b)(1) of the Act, 21 U.S.C.section 360bbb-3(b)(1), unless the authorization is terminated  or revoked sooner.       Influenza A by PCR NEGATIVE NEGATIVE Final   Influenza B by PCR NEGATIVE NEGATIVE Final    Comment: (NOTE) The Xpert Xpress SARS-CoV-2/FLU/RSV plus assay is intended as an aid in the diagnosis of influenza from Nasopharyngeal swab specimens and should not be used as a sole basis for treatment. Nasal washings and aspirates are unacceptable for Xpert Xpress SARS-CoV-2/FLU/RSV testing.  Fact Sheet for Patients: bloggercourse.com  Fact Sheet for Healthcare Providers: seriousbroker.it  This test is not yet approved or cleared by the [place]  FDA and has been authorized for detection and/or diagnosis of SARS-CoV-2 by FDA under an Emergency Use Authorization (EUA). This EUA will remain in effect (meaning this test can be used) for the duration of  the COVID-19 declaration under Section 564(b)(1) of the Act, 21 U.S.C. section 360bbb-3(b)(1), unless the authorization is terminated or revoked.     Resp Syncytial Virus by PCR NEGATIVE NEGATIVE Final    Comment: (NOTE) Fact Sheet for Patients: bloggercourse.com  Fact Sheet for Healthcare Providers: seriousbroker.it  This test is not yet approved or cleared by the [place]  FDA and has been authorized for detection and/or diagnosis of SARS-CoV-2 by FDA under an Emergency Use Authorization (EUA). This EUA will remain in effect (meaning this test can be used) for the duration of the COVID-19 declaration under Section 564(b)(1) of the Act, 21 U.S.C. section 360bbb-3(b)(1), unless the authorization is terminated or revoked.  Performed at [facility], [address] [place], [place] [address]     Time coordinating discharge: Over 30 minutes  Marien LITTIE Piety, MD  [facility] 11/03/2024, 10:32 AM

## 2024-11-03 NOTE — Plan of Care (Signed)
  Problem: Education: Goal: Knowledge of General Education information will improve Description: Including pain rating scale, medication(s)/side effects and non-pharmacologic comfort measures Outcome: Progressing   Problem: Health Behavior/Discharge Planning: Goal: Ability to manage health-related needs will improve Outcome: Progressing   Problem: Clinical Measurements: Goal: Ability to maintain clinical measurements within normal limits will improve Outcome: Progressing Goal: Will remain free from infection Outcome: Progressing Goal: Diagnostic test results will improve Outcome: Progressing Goal: Respiratory complications will improve Outcome: Progressing Goal: Cardiovascular complication will be avoided Outcome: Progressing   Problem: Activity: Goal: Risk for activity intolerance will decrease Outcome: Progressing   Problem: Nutrition: Goal: Adequate nutrition will be maintained Outcome: Progressing   Problem: Coping: Goal: Level of anxiety will decrease Outcome: Progressing   Problem: Elimination: Goal: Will not experience complications related to bowel motility Outcome: Progressing Goal: Will not experience complications related to urinary retention Outcome: Progressing   Problem: Pain Managment: Goal: General experience of comfort will improve and/or be controlled Outcome: Progressing   Problem: Safety: Goal: Ability to remain free from injury will improve Outcome: Progressing   Problem: Skin Integrity: Goal: Risk for impaired skin integrity will decrease Outcome: Progressing   Problem: Education: Goal: Knowledge of disease or condition will improve Outcome: Progressing Goal: Knowledge of the prescribed therapeutic regimen will improve Outcome: Progressing Goal: Individualized Educational Video(s) Outcome: Progressing   Problem: Activity: Goal: Ability to tolerate increased activity will improve Outcome: Progressing   Problem: Respiratory: Goal:  Ability to maintain a clear airway will improve Outcome: Progressing Goal: Levels of oxygenation will improve Outcome: Progressing Goal: Ability to maintain adequate ventilation will improve Outcome: Progressing

## 2024-11-03 NOTE — Evaluation (Addendum)
 Physical Therapy Evaluation Patient Details Name: Linda Brady MRN: [id number] DOB: February 14, 1941 Today's Date: 11/03/2024  History of Present Illness  Pt is a 83 y.o. female admitted for COPD exacerbation and chronic hypoxic respiratory failure, HFpEF mild exacerbation. CTA showed new compression fracture of L1, per MD pt can wear TLSO for comfort if desired. 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 female admitted d/t COPD exacerbation. Pt was received on EOB reporting need to use the bathroom. Pt previously using the [facility] but encouraged to walk to bathroom, pt agreeable. O2 on 2L 95% and pt reported she could wean to RA to get to/from bathroom. She was able to perform STS to RW and ambulate 24ft total to bathroom and back with supervision. Pt remained >91% on RA while walking to bathroom but dropped to 89% upon sitting. Cuing for PLB raised back up to 91%. Pt able to complete pericare/hygiene independently. While exiting bathroom, pt reported dizziness and need to sit. Pt set up in recliner and encouraged to remain there for an hour and instructed to call nursing for help back to bed when ready. Pt at 96% on 2L upon therapy exit. She will continue to benefit from skilled PT services to address her reduced activity tolerance and improve her functional mobility.      If plan is discharge home, recommend the following: A little help with walking and/or transfers;A little help with bathing/dressing/bathroom   Can travel by private vehicle        Equipment Recommendations None recommended by PT  Recommendations for Other Services       Functional Status Assessment Patient has had a recent decline in their functional status and demonstrates the ability to make significant improvements in function in a  reasonable and predictable amount of time.     Precautions / Restrictions Precautions Precautions: Back;Fall Restrictions Weight Bearing Restrictions Per Provider Order: No      Mobility  Bed Mobility Overal bed mobility: Modified Independent             General bed mobility comments: Pt was EOB upon PT entry    Transfers Overall transfer level: Needs assistance Equipment used: Rolling walker (2 wheels)   Sit to Stand: Supervision           General transfer comment: performed STS to RW from lowest bed height with supervision; able to STS from bathroom toilet with same assistance as above    Ambulation/Gait Ambulation/Gait assistance: Supervision Gait Distance (Feet): 30 Feet Assistive device: Rolling walker (2 wheels) Gait Pattern/deviations: Decreased stride length, Step-through pattern       General Gait Details: able to ambulate to bathroom with RW and supervision on RA with O2 remaining >91%  Stairs            Wheelchair Mobility     Tilt Bed    Modified Rankin (Stroke Patients Only)       Balance Overall balance assessment: Needs assistance Sitting-balance support: No upper extremity supported, Feet supported Sitting balance-Leahy Scale: Good Sitting balance - Comments: able to balance EOB without UE support   Standing balance support: Bilateral upper extremity supported, During functional activity Standing balance-Leahy Scale: Fair Standing balance comment: able to balance without UE support but cannot tolerate much challenge; close SBA when quiet standing without RW  Pertinent Vitals/Pain Pain Assessment Pain Assessment: No/denies pain    Home Living Family/patient expects to be discharged to:: Private residence Living Arrangements: Alone Available Help at Discharge: Family;Available PRN/intermittently Type of Home: House Home Access: Stairs to enter Entrance Stairs-Rails: Right Entrance  Stairs-Number of Steps: 4   Home Layout: One level Home Equipment: Shower seat - built [employer] (2 wheels);Grab bars - tub/shower      Prior Function Prior Level of Function : Independent/Modified Independent             Mobility Comments: uses RW due to chronic LBP ADLs Comments: ind with ADLs. Daughter drives her to/from appointments and does grocery shopping     Extremity/Trunk Assessment   Upper Extremity Assessment Upper Extremity Assessment: Overall WFL for tasks assessed    Lower Extremity Assessment Lower Extremity Assessment: Overall WFL for tasks assessed    Cervical / Trunk Assessment Cervical / Trunk Assessment: Normal  Communication   Communication Communication: Impaired Factors Affecting Communication: Hearing impaired    Cognition Arousal: Alert Behavior During Therapy: WFL for tasks assessed/performed   PT - Cognitive impairments: No apparent impairments                         Following commands: Intact       Cueing Cueing Techniques: Verbal cues     General Comments      Exercises Other Exercises Other Exercises: amb 30 ft total to bathroom and back to recliner with RW and supervision Other Exercises: completed pericare/hygiene independently Other Exercises: reinforced edu on spinal pre s/p compression fx L1   Assessment/Plan    PT Assessment Patient needs continued PT services  PT Problem List Decreased activity tolerance;Decreased balance;Decreased strength;Decreased mobility       PT Treatment Interventions Gait training;Stair training;Functional mobility training;Therapeutic activities;Therapeutic exercise;Balance training    PT Goals (Current goals can be found in the Care Plan section)  Acute Rehab PT Goals Patient Stated Goal: to return home PT Goal Formulation: With patient Time For Goal Achievement: 11/17/24 Potential to Achieve Goals: Good    Frequency Min 2X/week     Co-evaluation                AM-PAC PT 6 Clicks Mobility  Outcome Measure Help needed turning from your back to your side while in a flat bed without using bedrails?: None Help needed moving from lying on your back to sitting on the side of a flat bed without using bedrails?: None Help needed moving to and from a bed to a chair (including a wheelchair)?: None Help needed standing up from a chair using your arms (e.g., wheelchair or bedside chair)?: None Help needed to walk in hospital room?: A Little Help needed climbing 3-5 steps with a railing? : A Lot 6 Click Score: 21    End of Session Equipment Utilized During Treatment: Gait belt;Oxygen Activity Tolerance: Patient tolerated treatment well Patient left: in chair;with chair alarm set;with call bell/phone within reach Nurse Communication: Mobility status PT Visit Diagnosis: Unsteadiness on feet (R26.81);Other abnormalities of gait and mobility (R26.89)    Time: 9060-9045 PT Time Calculation (min) (ACUTE ONLY): 15 min   Charges:                 Allena Bulls, SPT  Maryanne Finder, PT, DPT Physical Therapist - [facility]  [facility] 11/03/2024, 11:19 AM

## 2024-11-03 NOTE — Discharge Instructions (Addendum)
 Continue to use your oxygen and do a nebulizer treatment twice per day for the next couple of days while you are recovering. Then you can use it as needed.  Please use your scheduled daily inhalers as prescribed and follow up with your PCP in 1-2 weeks for hospital follow up  I have prescribed an iron supplement. Please be aware that taking iron can turn your stools a dark black color and cause constipation.  Taking a mild stool softener such as miralax  daily can help to prevent constipation.  Taking your iron supplement with vitamin C (such as orange juice) can help your body to absorb the iron

## 2024-11-03 NOTE — Progress Notes (Signed)
 Occupational Therapy Treatment Patient Details Name: Linda Brady MRN: [id number] DOB: 01-11-1941 Today's Date: 11/03/2024   History of present illness Pt is a 83 y.o. female admitted for COPD exacerbation and chronic hypoxic respiratory failure, HFpEF mild exacerbation. CTA showed new compression fracture of L1, per MD pt can wear TLSO for comfort if desired. 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 Pt requires supervision for UB/LB dressing from STS. No c/o LBP and good adherence to spinal precautions for comfort. Educated pt on use of compensatory strategies for sponge bathing. VSS on baseline 2L O2. OT will follow acutely. Discharge recommendation appropriate.       If plan is discharge home, recommend the following:  A little help with walking and/or transfers;A little help with bathing/dressing/bathroom;Assist for transportation;Help with stairs or ramp for entrance   Equipment Recommendations  BSC/3in1       Precautions / Restrictions Precautions Precautions: Back;Fall Restrictions Weight Bearing Restrictions Per Provider Order: No       Mobility Bed Mobility Overal bed mobility: Modified Independent                  Transfers Overall transfer level: Needs assistance Equipment used: None, Rolling walker (2 wheels) Transfers: Sit to/from Stand Sit to Stand: Supervision                 Balance Overall balance assessment: Needs assistance Sitting-balance support: No upper extremity supported, Feet supported Sitting balance-Leahy Scale: Good Sitting balance - Comments: good balance with dynamic challenges   Standing balance support: No upper extremity supported Standing balance-Leahy Scale: Fair                              ADL either performed or assessed with clinical judgement   ADL Overall ADL's : Needs assistance/impaired                 Upper Body Dressing : Sitting;Supervision/safety Upper Body Dressing Details (indicate cue type and reason): doffs gown and dons top, assist only for O2 mgmt Lower Body Dressing: Adhering to back precautions;Sitting/lateral leans;Sit to/from stand;Supervision/safety Lower Body Dressing Details (indicate cue type and reason): doffs underwear, dons underwear and pants. no LOB, steady in standing to pull pants over hips             Functional mobility during ADLs: Supervision/safety General ADL Comments: UB / LB dressing supervision level    Extremity/Trunk Assessment Upper Extremity Assessment Upper Extremity Assessment: Overall WFL for tasks assessed   Lower Extremity Assessment Lower Extremity Assessment: Overall WFL for tasks assessed   Cervical / Trunk Assessment Cervical / Trunk Assessment: Normal             Communication Communication Communication: Impaired Factors Affecting Communication: Hearing impaired   Cognition Arousal: Alert Behavior During Therapy: WFL for tasks assessed/performed Cognition: No apparent impairments                               Following commands: Intact        Cueing   Cueing Techniques: Verbal cues        General Comments     Pertinent Vitals/ Pain       Pain  Assessment Pain Assessment: No/denies pain  Home Living Family/patient expects to be discharged to:: Private residence Living Arrangements: Alone Available Help at Discharge: Family;Available PRN/intermittently Type of Home: House Home Access: Stairs to enter [employer] of Steps: 4 Entrance Stairs-Rails: Right Home Layout: One level     Bathroom Shower/Tub: Producer, Television/film/video: Standard Bathroom Accessibility: Yes   Home Equipment: Shower seat - built [employer] (2 wheels);Grab  bars - tub/shower              Frequency  Min 2X/week        Progress Toward Goals  OT Goals(current goals can now be found in the care plan section)  Progress towards OT goals: Progressing toward goals  Acute Rehab OT Goals OT Goal Formulation: With patient Time For Goal Achievement: 11/16/24 Potential to Achieve Goals: Good ADL Goals Pt Will Perform Grooming: with modified independence;standing Pt Will Perform Lower Body Bathing: with modified independence;sit to/from stand;sitting/lateral leans;with adaptive equipment Pt Will Perform Lower Body Dressing: with modified independence;sitting/lateral leans;with adaptive equipment;sit to/from stand Pt Will Transfer to Toilet: with modified independence;ambulating;grab bars Pt Will Perform Toileting - Clothing Manipulation and hygiene: with modified independence;sitting/lateral leans;sit to/from stand;with adaptive equipment Additional ADL Goal #1: Pt will return demo use of spinal precautions during ADL performance for improved pain control  Plan         AM-PAC OT 6 Clicks Daily Activity     Outcome Measure   Help from another person eating meals?: None Help from another person taking care of personal grooming?: None Help from another person toileting, which includes using toliet, bedpan, or urinal?: A Little Help from another person bathing (including washing, rinsing, drying)?: A Little Help from another person to put on and taking off regular upper body clothing?: None Help from another person to put on and taking off regular lower body clothing?: A Little 6 Click Score: 21    End of Session Equipment Utilized During Treatment: Oxygen  OT Visit Diagnosis: Muscle weakness (generalized) (M62.81);Unsteadiness on feet (R26.81)   Activity Tolerance Patient tolerated treatment well;No increased pain   Patient Left in bed;with call bell/phone within reach;with nursing/sitter in room;with family/visitor present    Nurse Communication Mobility status        Time: 8844-8793 OT Time Calculation (min): 11 min  Charges: OT General Charges $OT Visit: 1 Visit OT Treatments $Self Care/Home Management : 8-22 mins  Sue Mcalexander L. Jaylene Schrom, OTR/L  11/03/24, 2:03 PM

## 2024-11-03 NOTE — Progress Notes (Incomplete)
 PROGRESS NOTE ELLI Brady    DOB: September 23, 1941, 83 y.o.  FMW:[id number]    Code Status: Full Code   DOA: 11/01/2024   LOS: 1  Brief hospital course  Linda Brady is a 83 y.o. 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  1 week prior to arrival she was diagnosed with pneumonia and has been on antibiotics.  She denies any improvement since initiation. In the ED she was maintaining O2 sats 100% on room air but did have increased work of breathing.  CT negative for PE, pulmonary edema, or pneumonia.  Some bronchial wall thickening and mucous plugging in lower lobes.  Symptoms were refractory to steroids and nebulizers and thus the patient was admitted for further management  They presented from *** to the ED on 11/01/2024 with *** x *** days. ***  In the ED, it was found that they had ***.  Significant findings included ***.  They were initially treated with ***.   Patient was admitted to medicine service for further workup and management of *** as outlined in detail below.  11/03/24 -***  Assessment & Plan  Principal Problem:   COPD exacerbation (HCC) Active Problems:   Chronic heart failure with preserved ejection fraction (HFpEF) (HCC)  COPD exacerbation - Currently at her baseline O2 requirement - Still dyspneic and tachypneic on exam today. - CTA negative for PE, pneumonia, effusions.  Does reveal findings of chronic bronchitis - Continue Solu-Medrol  - Scheduled DuoNebs and mucolytics - Was started on azithromycin  but given no leukocytosis, fever, or infiltrates will discontinue. - RVP negative - Courage I-S and FV   Chronic hypoxic respiratory failure - At baseline patient is on 2 L [place], she is at her baseline  now - Continue supportive care for exacerbation as above - PT/OT consults   Hypokalemia - Replace and continue to monitor   Heart failure preserved EF Nonischemic CAD - Some lower extremity edema on arrival - Troponin on arrival 29, unchanged on repeat - proBNP 229 - No evidence of pulmonary edema or vascular congestion on imaging - Continue home dose spironolactone .  IV Lasix  twice daily for now - Monitor I's and O's - Continue home GDMT, monitor BPs closely   Paroxysmal A-fib - Continue home dose amiodarone  and Eliquis    Hypertension - Resume home meds, titrate as needed   Anxiety Restless leg syndrome - Resume home meds   Hypothyroidism - Continue home dose Synthroid    Antiphospholipid syndrome - On Eliquis  at home.  Continue   Body mass index is 28.27 kg/m. - Outpatient follow up for lifestyle modification and risk factor management   Iron deficiency anemia - Iron 22, TSAT 5 -TIBC, ferritin, folate all WNL - Start ferrous sulfate daily - Hemoglobin baseline around 9.  Currently stable consistent with prior   L1 Compression Fracture - Incidentally seen on chest CTA.  Patient denies any acute pain or acute trauma.  She does endorse chronic low back pain. - We offered TLSO brace but she refuses to wear. - Continue fall precautions - PT/OT aware  Body mass index is 28.27 kg/m.  VTE ppx:  apixaban  (ELIQUIS ) tablet 5 mg   Diet:     Diet   Diet Heart Room service appropriate? Yes; Fluid consistency: Thin  Consultants: ***  Subjective 11/03/24    Pt reports ***   Objective  Blood pressure (!) 139/59, pulse 66, temperature (!) 97.5 F (36.4 C), resp. rate 18, weight 72.4 kg, SpO2 100%.  Intake/Output Summary (Last 24 hours) at 11/03/2024 0823 Last data filed at 11/02/2024 1600 Gross per 24 hour  Intake 474 ml  Output --  Net 474 ml   Filed Weights   11/01/24 1434 11/01/24 2105  Weight: 74 kg 72.4 kg     Physical Exam: *** General: awake,  alert, NAD HEENT: atraumatic, clear conjunctiva, anicteric sclera, MMM, hearing grossly normal Respiratory: normal respiratory effort. Cardiovascular: extremities well perfused, quick capillary refill, normal S1/S2, RRR, no JVD, murmurs Gastrointestinal: soft, NT, ND Nervous: A&O x3. no gross focal neurologic deficits, normal speech Extremities: moves all equally, no edema, normal tone Skin: dry, intact, normal temperature, normal color. No rashes, lesions or ulcers on exposed skin Psychiatry: normal mood, congruent affect  Labs   I have personally reviewed the following labs and imaging studies CBC    Component Value Date/Time   WBC 8.5 11/03/2024 0603   RBC 4.04 11/03/2024 0603   HGB 10.4 (L) 11/03/2024 0603   HGB 9.5 (L) 11/04/2023 0859   HCT 31.9 (L) 11/03/2024 0603   HCT 28.0 (L) 11/04/2023 0859   PLT 346 11/03/2024 0603   PLT 585 (H) 11/04/2023 0859   MCV 79.0 (L) 11/03/2024 0603   MCV 97 11/04/2023 0859   MCV 99 09/01/2014 1631   MCH 25.7 (L) 11/03/2024 0603   MCHC 32.6 11/03/2024 0603   RDW 19.2 (H) 11/03/2024 0603   RDW 13.7 11/04/2023 0859   RDW 14.1 09/01/2014 1631   LYMPHSABS 1.0 11/03/2024 0603   LYMPHSABS 2.8 09/01/2014 1631   MONOABS 1.1 (H) 11/03/2024 0603   MONOABS 0.7 09/01/2014 1631   EOSABS 0.0 11/03/2024 0603   EOSABS 0.1 09/01/2014 1631   BASOSABS 0.0 11/03/2024 0603   BASOSABS 0.1 09/01/2014 1631      Latest Ref Rng & Units 11/03/2024    6:03 AM 11/02/2024    4:41 AM 11/01/2024    2:40 PM  BMP  Glucose 70 - 99 mg/dL 899  848  99   BUN 8 - 23 mg/dL 16  10  10    Creatinine 0.44 - 1.00 mg/dL 8.95  9.02  8.99   Sodium 135 - 145 mmol/L 131  135  134   Potassium 3.5 - 5.1 mmol/L 3.3  3.2  2.9   Chloride 98 - 111 mmol/L 93  95  94   CO2 22 - 32 mmol/L 27  27  27    Calcium  8.9 - 10.3 mg/dL 9.6  9.6  9.6     CT Angio Chest PE W and/or Wo Contrast Result Date: 11/01/2024 EXAM: CTA CHEST 11/01/2024 06:58:18 PM TECHNIQUE: CTA of the chest was performed  with the administration of 75 mL of iohexol  (OMNIPAQUE ) 350 MG/ML injection. Multiplanar reformatted images are provided for review. MIP images are provided for review. Automated exposure control, iterative reconstruction, and/or weight based adjustment of the mA/kV was utilized to reduce the radiation dose to as low as reasonably achievable. COMPARISON: Same day x-ray and CT chest 23/25. CLINICAL HISTORY: Pulmonary embolism (PE) suspected, low to intermediate prob, positive D-dimer. Chest pain and shortness of breath. FINDINGS: PULMONARY ARTERIES: Pulmonary arteries are adequately opacified for evaluation. No acute pulmonary embolus. Main pulmonary artery is normal in caliber. MEDIASTINUM: The heart and pericardium demonstrate no acute abnormality. Coronary artery and aortic  atherosclerotic calcifications. There is no acute abnormality of the thoracic aorta. LYMPH NODES: No mediastinal, hilar or axillary lymphadenopathy. LUNGS AND PLEURA: Emphysema. Mild bronchial wall thickening and mucus plugging in the lower lobes. Scarring / atelectasis in the lower lungs. No focal consolidation or pulmonary edema. No evidence of pleural effusion or pneumothorax. UPPER ABDOMEN: Large hiatal hernia. Limited images of the upper abdomen are otherwise unremarkable. SOFT TISSUES AND BONES: Superior endplate compression fracture of L1, new since MRI 07/10/2024. There is approximately 25% vertebral body height loss and 4 mm of retropulsion of the superior endplate. No acute soft tissue abnormality. IMPRESSION: 1. No pulmonary embolism. 2. New superior endplate compression fracture of L1 with approximately 25% vertebral body height loss and 4 mm of retropulsion of the superior endplate. 3. Pulmonary emphysema and chronic bronchitis. Electronically signed by: Norman Gatlin MD 11/01/2024 07:54 PM EST RP Workstation: [id number]   DG Chest 2 View Result Date: 11/01/2024 CLINICAL DATA:  Shortness of breath and chest pain. EXAM: CHEST -  2 VIEW COMPARISON:  07/27/2024 and CT chest 04/23/2024. FINDINGS: Trachea is midline. Heart is enlarged, stable. Aortic valve replacement. No airspace consolidation or pleural fluid. Minimal streaky scarring in the lung bases. Degenerative changes in the spine. IMPRESSION: No acute findings. Electronically Signed   By: Newell Eke M.D.   On: 11/01/2024 16:02    Disposition Plan & Communication  Patient status: Inpatient  Admitted From: {From:[id number]} Planned disposition location: {PLAN; DISPOSITION:26386} Anticipated discharge date: *** pending ***  Family Communication: ***    Author: Marien LITTIE Piety, DO [facility] 11/03/2024, 8:23 AM   Available by Epic secure chat 7AM-7PM. If 7PM-7AM, please contact night-coverage.  [facility] contact information found on [email or web address].

## 2024-11-11 ENCOUNTER — Inpatient Hospital Stay
Admission: EM | Admit: 2024-11-11 | Discharge: 2024-11-15 | DRG: 190 | Disposition: A | Attending: Internal Medicine | Admitting: Internal Medicine

## 2024-11-11 ENCOUNTER — Other Ambulatory Visit: Payer: Self-pay

## 2024-11-11 ENCOUNTER — Emergency Department

## 2024-11-11 DIAGNOSIS — N1831 Chronic kidney disease, stage 3a: Secondary | ICD-10-CM | POA: Diagnosis present

## 2024-11-11 DIAGNOSIS — Z79890 Hormone replacement therapy (postmenopausal): Secondary | ICD-10-CM | POA: Diagnosis not present

## 2024-11-11 DIAGNOSIS — E785 Hyperlipidemia, unspecified: Secondary | ICD-10-CM | POA: Diagnosis present

## 2024-11-11 DIAGNOSIS — F419 Anxiety disorder, unspecified: Secondary | ICD-10-CM | POA: Diagnosis present

## 2024-11-11 DIAGNOSIS — F32A Depression, unspecified: Secondary | ICD-10-CM | POA: Diagnosis present

## 2024-11-11 DIAGNOSIS — I5032 Chronic diastolic (congestive) heart failure: Secondary | ICD-10-CM | POA: Diagnosis not present

## 2024-11-11 DIAGNOSIS — Z7901 Long term (current) use of anticoagulants: Secondary | ICD-10-CM | POA: Diagnosis not present

## 2024-11-11 DIAGNOSIS — Z9981 Dependence on supplemental oxygen: Secondary | ICD-10-CM | POA: Diagnosis not present

## 2024-11-11 DIAGNOSIS — R0602 Shortness of breath: Secondary | ICD-10-CM

## 2024-11-11 DIAGNOSIS — E039 Hypothyroidism, unspecified: Secondary | ICD-10-CM | POA: Diagnosis present

## 2024-11-11 DIAGNOSIS — E663 Overweight: Secondary | ICD-10-CM | POA: Diagnosis present

## 2024-11-11 DIAGNOSIS — I482 Chronic atrial fibrillation, unspecified: Secondary | ICD-10-CM | POA: Diagnosis present

## 2024-11-11 DIAGNOSIS — D509 Iron deficiency anemia, unspecified: Secondary | ICD-10-CM | POA: Diagnosis present

## 2024-11-11 DIAGNOSIS — F418 Other specified anxiety disorders: Secondary | ICD-10-CM | POA: Diagnosis not present

## 2024-11-11 DIAGNOSIS — J441 Chronic obstructive pulmonary disease with (acute) exacerbation: Principal | ICD-10-CM | POA: Diagnosis present

## 2024-11-11 DIAGNOSIS — J9621 Acute and chronic respiratory failure with hypoxia: Secondary | ICD-10-CM | POA: Diagnosis present

## 2024-11-11 DIAGNOSIS — T501X5A Adverse effect of loop [high-ceiling] diuretics, initial encounter: Secondary | ICD-10-CM | POA: Diagnosis present

## 2024-11-11 DIAGNOSIS — I5A Non-ischemic myocardial injury (non-traumatic): Secondary | ICD-10-CM | POA: Diagnosis not present

## 2024-11-11 DIAGNOSIS — Z952 Presence of prosthetic heart valve: Secondary | ICD-10-CM | POA: Diagnosis not present

## 2024-11-11 DIAGNOSIS — K219 Gastro-esophageal reflux disease without esophagitis: Secondary | ICD-10-CM | POA: Diagnosis present

## 2024-11-11 DIAGNOSIS — Z6827 Body mass index (BMI) 27.0-27.9, adult: Secondary | ICD-10-CM | POA: Diagnosis not present

## 2024-11-11 DIAGNOSIS — E871 Hypo-osmolality and hyponatremia: Secondary | ICD-10-CM | POA: Diagnosis present

## 2024-11-11 DIAGNOSIS — Z79899 Other long term (current) drug therapy: Secondary | ICD-10-CM | POA: Diagnosis not present

## 2024-11-11 DIAGNOSIS — Z7951 Long term (current) use of inhaled steroids: Secondary | ICD-10-CM | POA: Diagnosis not present

## 2024-11-11 DIAGNOSIS — I10 Essential (primary) hypertension: Secondary | ICD-10-CM | POA: Diagnosis present

## 2024-11-11 DIAGNOSIS — Z1152 Encounter for screening for COVID-19: Secondary | ICD-10-CM | POA: Diagnosis not present

## 2024-11-11 DIAGNOSIS — I130 Hypertensive heart and chronic kidney disease with heart failure and stage 1 through stage 4 chronic kidney disease, or unspecified chronic kidney disease: Secondary | ICD-10-CM | POA: Diagnosis present

## 2024-11-11 DIAGNOSIS — R0603 Acute respiratory distress: Secondary | ICD-10-CM

## 2024-11-11 DIAGNOSIS — Z87891 Personal history of nicotine dependence: Secondary | ICD-10-CM | POA: Diagnosis not present

## 2024-11-11 LAB — CBC
HCT: 34.5 % — ABNORMAL LOW (ref 36.0–46.0)
Hemoglobin: 10.5 g/dL — ABNORMAL LOW (ref 12.0–15.0)
MCH: 24.3 pg — ABNORMAL LOW (ref 26.0–34.0)
MCHC: 30.4 g/dL (ref 30.0–36.0)
MCV: 79.9 fL — ABNORMAL LOW (ref 80.0–100.0)
Platelets: 359 K/uL (ref 150–400)
RBC: 4.32 MIL/uL (ref 3.87–5.11)
RDW: 20.7 % — ABNORMAL HIGH (ref 11.5–15.5)
WBC: 6.5 K/uL (ref 4.0–10.5)
nRBC: 0 % (ref 0.0–0.2)

## 2024-11-11 LAB — BASIC METABOLIC PANEL WITH GFR
Anion gap: 14 (ref 5–15)
BUN: 9 mg/dL (ref 8–23)
CO2: 23 mmol/L (ref 22–32)
Calcium: 10.1 mg/dL (ref 8.9–10.3)
Chloride: 93 mmol/L — ABNORMAL LOW (ref 98–111)
Creatinine, Ser: 1.09 mg/dL — ABNORMAL HIGH (ref 0.44–1.00)
GFR, Estimated: 51 mL/min — ABNORMAL LOW (ref >60)
Glucose, Bld: 101 mg/dL — ABNORMAL HIGH (ref 70–99)
Potassium: 3.9 mmol/L (ref 3.5–5.1)
Sodium: 130 mmol/L — ABNORMAL LOW (ref 135–145)

## 2024-11-11 LAB — PRO BRAIN NATRIURETIC PEPTIDE: Pro Brain Natriuretic Peptide: 184 pg/mL (ref <300.0)

## 2024-11-11 LAB — RESP PANEL BY RT-PCR (RSV, FLU A&B, COVID)  RVPGX2
Influenza A by PCR: NEGATIVE
Influenza B by PCR: NEGATIVE
Resp Syncytial Virus by PCR: NEGATIVE
SARS Coronavirus 2 by RT PCR: NEGATIVE

## 2024-11-11 LAB — TROPONIN T, HIGH SENSITIVITY
Troponin T High Sensitivity: 30 ng/L — ABNORMAL HIGH (ref 0–19)
Troponin T High Sensitivity: 30 ng/L — ABNORMAL HIGH (ref 0–19)

## 2024-11-11 MED ORDER — ACETAMINOPHEN 325 MG PO TABS
650.0000 mg | ORAL_TABLET | Freq: Four times a day (QID) | ORAL | Status: DC | PRN
  Administered 2024-11-11 – 2024-11-12 (×3): 650 mg via ORAL
  Filled 2024-11-11 (×3): qty 2

## 2024-11-11 MED ORDER — ALBUTEROL SULFATE (2.5 MG/3ML) 0.083% IN NEBU: 10.0000 mg/h | INHALATION_SOLUTION | RESPIRATORY_TRACT | Status: DC

## 2024-11-11 MED ORDER — METHYLPREDNISOLONE SODIUM SUCC 125 MG IJ SOLR
80.0000 mg | Freq: Every day | INTRAMUSCULAR | Status: DC
  Administered 2024-11-12: 80 mg via INTRAVENOUS
  Filled 2024-11-11: qty 2

## 2024-11-11 MED ORDER — IPRATROPIUM-ALBUTEROL 0.5-2.5 (3) MG/3ML IN SOLN
3.0000 mL | Freq: Once | RESPIRATORY_TRACT | Status: AC
  Administered 2024-11-11: 3 mL via RESPIRATORY_TRACT
  Filled 2024-11-11: qty 3

## 2024-11-11 MED ORDER — DOXYCYCLINE HYCLATE 100 MG PO TABS
100.0000 mg | ORAL_TABLET | Freq: Two times a day (BID) | ORAL | Status: DC
  Administered 2024-11-11 – 2024-11-12 (×2): 100 mg via ORAL
  Filled 2024-11-11 (×2): qty 1

## 2024-11-11 MED ORDER — IPRATROPIUM-ALBUTEROL 0.5-2.5 (3) MG/3ML IN SOLN
3.0000 mL | RESPIRATORY_TRACT | Status: DC
  Administered 2024-11-11 – 2024-11-12 (×6): 3 mL via RESPIRATORY_TRACT
  Filled 2024-11-11 (×6): qty 3

## 2024-11-11 MED ORDER — LIDOCAINE 5 % EX PTCH
1.0000 | MEDICATED_PATCH | CUTANEOUS | Status: DC
  Administered 2024-11-11 – 2024-11-14 (×4): 1 via TRANSDERMAL
  Filled 2024-11-11 (×4): qty 1

## 2024-11-11 MED ORDER — HYDRALAZINE HCL 20 MG/ML IJ SOLN: 5.0000 mg | INTRAMUSCULAR | Status: DC | PRN

## 2024-11-11 MED ORDER — METHYLPREDNISOLONE SODIUM SUCC 125 MG IJ SOLR
125.0000 mg | Freq: Once | INTRAMUSCULAR | Status: AC
  Administered 2024-11-11: 125 mg via INTRAVENOUS
  Filled 2024-11-11: qty 2

## 2024-11-11 MED ORDER — ONDANSETRON HCL 4 MG/2ML IJ SOLN
4.0000 mg | Freq: Three times a day (TID) | INTRAMUSCULAR | Status: DC | PRN
  Administered 2024-11-12: 4 mg via INTRAVENOUS
  Filled 2024-11-11: qty 2

## 2024-11-11 MED ORDER — ALBUTEROL SULFATE (2.5 MG/3ML) 0.083% IN NEBU
2.5000 mg | INHALATION_SOLUTION | RESPIRATORY_TRACT | Status: DC | PRN
  Administered 2024-11-12 (×2): 2.5 mg via RESPIRATORY_TRACT
  Filled 2024-11-11 (×2): qty 3

## 2024-11-11 NOTE — ED Triage Notes (Signed)
 Pt to ED via EMS from home, pt reports shob since last night. Per ems pt was 83% on RA but pt wears 2L [place] at home. Pt currently on 4L at this time sats at 100%, pt appears tachypnic. Pt reports dry cough for past few days. Pt reports a dull pain in her chest and feels like she is having a hard time taking a deep breath.

## 2024-11-11 NOTE — ED Provider Notes (Signed)
 [facility] Provider Note   Event Date/Time   First MD Initiated Contact with Patient 11/11/24 2132     (approximate) History  Chest Pain and Shortness of Breath  HPI Linda Brady is a 83 y.o. female with a stated past medical history of atrial fibrillation, CHF, and COPD on 2 L nasal cannula chronically presents complaining of worsening shortness of breath via EMS.  EMS found patient to be 83% on 2 L and needed 4 L with adequate oxygenation.  Patient continues to be tachypneic with respiratory rate in the 20s throughout transport.  Patient also reports a dull chest tightness that feels like she is having a difficult time taking a deep breath.  Patient denies any recent travel, sick contacts, or food at the ordinary ROS: Patient currently denies any vision changes, tinnitus, difficulty speaking, facial droop, sore throat, abdominal pain, nausea/vomiting/diarrhea, dysuria, or weakness/numbness/paresthesias in any extremity   Physical Exam  Triage Vital Signs: ED Triage Vitals  Encounter Vitals Group     BP 11/11/24 2025 (!) 179/89     Girls Systolic BP Percentile --      Girls Diastolic BP Percentile --      Boys Systolic BP Percentile --      Boys Diastolic BP Percentile --      Pulse Rate 11/11/24 2025 81     Resp 11/11/24 2025 (!) 24     Temp 11/11/24 2025 97.8 F (36.6 C)     Temp src --      SpO2 11/11/24 2025 100 %     Weight 11/11/24 2024 158 lb (71.7 kg)     Height 11/11/24 2024 5' 3 (1.6 m)     Head Circumference --      Peak Flow --      Pain Score 11/11/24 2024 3     Pain Loc --      Pain Education --      Exclude from Growth Chart --    Most recent vital signs: Vitals:   11/11/24 2230 11/11/24 2300  BP: (!) 147/76 (!) 164/83  Pulse: 73 81  Resp: (!) 24 (!) 21  Temp:    SpO2: 100% 100%   General: Awake, oriented x4. CV:  Good peripheral perfusion. Resp:  Increased effort.  Decreased breath sounds bilaterally Abd:  No  distention. Other:  Elderly overweight Caucasian female resting in stretcher in mild respiratory distress ED Results / Procedures / Treatments  Labs (all labs ordered are listed, but only abnormal results are displayed) Labs Reviewed  BASIC METABOLIC PANEL WITH GFR - Abnormal; Notable for the following components:      Result Value   Sodium 130 (*)    Chloride 93 (*)    Glucose, Bld 101 (*)    Creatinine, Ser 1.09 (*)    GFR, Estimated 51 (*)    All other components within normal limits  CBC - Abnormal; Notable for the following components:   Hemoglobin 10.5 (*)    HCT 34.5 (*)    MCV 79.9 (*)    MCH 24.3 (*)    RDW 20.7 (*)    All other components within normal limits  TROPONIN T, HIGH SENSITIVITY - Abnormal; Notable for the following components:   Troponin T High Sensitivity 30 (*)    All other components within normal limits  TROPONIN T, HIGH SENSITIVITY - Abnormal; Notable for the following components:   Troponin T High Sensitivity 30 (*)    All other  components within normal limits  RESP PANEL BY RT-PCR (RSV, FLU A&B, COVID)  RVPGX2  RESPIRATORY PANEL BY PCR  EXPECTORATED SPUTUM ASSESSMENT W GRAM STAIN, RFLX TO RESP C  PRO BRAIN NATRIURETIC PEPTIDE  BASIC METABOLIC PANEL WITH GFR  CBC   EKG ED ECG REPORT I, Artist MARLA Kerns, the attending physician, personally viewed and interpreted this ECG. Date: 11/11/2024 EKG Time: 2026 Rate: 81 Rhythm: normal sinus rhythm QRS Axis: normal Intervals: normal ST/T Wave abnormalities: normal Narrative Interpretation: no evidence of acute ischemia RADIOLOGY ED MD interpretation: Single view portable chest x-ray shows no acute cardiopulmonary abnormality - All radiology independently interpreted and agree with radiology assessment Official radiology report(s): DG Chest 1 View Result Date: 11/11/2024 EXAM: 1 VIEW(S) XRAY OF THE CHEST 11/11/2024 08:47:00 PM COMPARISON: 11/01/2024 CLINICAL HISTORY: Shortness of breath FINDINGS:  LINES, TUBES AND DEVICES: Right upper extremity vascular stent noted. Transcatheter aortic valve replacement noted. LUNGS AND PLEURA: No focal pulmonary opacity. No pleural effusion. No pneumothorax. HEART AND MEDIASTINUM: Aortic atherosclerosis. Transcatheter aortic valve replacement noted. BONES AND SOFT TISSUES: Small hiatal hernia. No acute osseous abnormality. IMPRESSION: 1. No acute cardiopulmonary abnormality. Electronically signed by: Pinkie Pebbles MD 11/11/2024 08:49 PM EST RP Workstation: [id number]   PROCEDURES: Critical Care performed: No Procedures MEDICATIONS ORDERED IN ED: Medications  ipratropium-albuterol  (DUONEB) 0.5-2.5 (3) MG/3ML nebulizer solution 3 mL (3 mLs Nebulization Given 11/11/24 2329)  albuterol  (PROVENTIL ) (2.5 MG/3ML) 0.083% nebulizer solution 2.5 mg (has no administration in time range)  dextromethorphan -guaiFENesin  (MUCINEX  DM) 30-600 MG per 12 hr tablet 1 tablet (has no administration in time range)  methylPREDNISolone  sodium succinate (SOLU-MEDROL ) 125 mg/2 mL injection 80 mg (has no administration in time range)  ondansetron  (ZOFRAN ) injection 4 mg (has no administration in time range)  hydrALAZINE  (APRESOLINE ) injection 5 mg (has no administration in time range)  acetaminophen  (TYLENOL ) tablet 650 mg (650 mg Oral Given 11/11/24 2328)  doxycycline (VIBRA-TABS) tablet 100 mg (100 mg Oral Given 11/11/24 2329)  lidocaine  (LIDODERM ) 5 % 1 patch (1 patch Transdermal Patch Applied 11/11/24 2329)  ipratropium-albuterol  (DUONEB) 0.5-2.5 (3) MG/3ML nebulizer solution 3 mL (3 mLs Nebulization Given 11/11/24 2153)  methylPREDNISolone  sodium succinate (SOLU-MEDROL ) 125 mg/2 mL injection 125 mg (125 mg Intravenous Given 11/11/24 2154)   IMPRESSION / MDM / ASSESSMENT AND PLAN / ED COURSE  I reviewed the triage vital signs and the nursing notes.                             The patient is on the cardiac monitor to evaluate for evidence of arrhythmia and/or significant  heart rate changes. Patient's presentation is most consistent with acute presentation with potential threat to life or bodily function. Patient is an 83 year old female with the above-stated past medical history who presents complaining of worsening shortness of breath DDx: COPD exacerbation, CHF exacerbation, pulmonary edema, upper respiratory infection, pneumonia Plan: CBC, BMP, troponin, BNP, RVP, chest x-ray, EKG  Patient shows signs and symptoms of COPD exacerbation with minimal air movement on exam.  Patient is not improving despite breathing treatments and steroids and therefore will require admission in the internal medicine service given increased oxygen requirement and respiratory distress.  I have spoken to the on-call hospitalist who has agreed to accept this patient for further evaluation and management.  Dispo: Admit to medicine   FINAL CLINICAL IMPRESSION(S) / ED DIAGNOSES   Final diagnoses:  Respiratory distress  Shortness of breath  COPD exacerbation (HCC)  Rx / DC Orders   ED Discharge Orders     None      Note:  This document was prepared using Dragon voice recognition software and may include unintentional dictation errors.   Edelmiro Innocent K, MD 11/11/24 [phone]

## 2024-11-11 NOTE — H&P (Signed)
 History and Physical    Linda Brady FMW:[id number] DOB: 01-29-1941 DOA: 11/11/2024  Referring MD/NP/PA:   PCP: Valora Lynwood FALCON, MD   Patient coming from:  The patient is coming from home.     Chief Complaint: SOB  HPI: Linda Brady is a 83 y.o. female with medical history significant of  HTN, HLD, COPD on 2 L oxygen, dCHF, hypothyroidism, CKD stage IIIa, A-fib on Eliquis , who presents with SOB.  Patient states that her shortness of breath has been progressively worsening since last night.  She has dry cough, no fever or chills.  She has chest pressure and tightness.  No nausea, vomiting, diarrhea or abdominal pain.  No symptoms of UTI.   Patient is normally using 2 L oxygen at baseline, was found to have oxygen desaturation to 83% on 2 L oxygen, which improved to 100% on 4L oxygen in ED. Patient has difficulty speaking in full sentence with moderate acute respiratory distress when I saw pt in ED.  Data reviewed independently and ED Course: pt was found to have negative PCR for COVID, flu and RSV, stable renal function, WBC 6.5, troponin 30.  Temperature normal, blood pressure 170/81, heart rate 81, RR 26.  Chest x-ray negative for infiltration.  Patient is admitted to PCU as inpatient.   EKG: I have personally reviewed.  Sinus rhythm, QTc 464, poor R wave pression, anteroseptal infarction pattern   Review of Systems:   General: no fevers, chills, no body weight gain, has fatigue HEENT: no blurry vision, hearing changes or sore throat Respiratory: has dyspnea, coughing, wheezing CV: Has chest tightness, no palpitations GI: no nausea, vomiting, abdominal pain, diarrhea, constipation GU: no dysuria, burning on urination, increased urinary frequency, hematuria  Ext: no leg edema Neuro: no unilateral weakness, numbness, or tingling, no vision change or hearing loss Skin: no rash, no skin tear. MSK: No muscle spasm, no deformity, no limitation of range of movement in  spin Heme: No easy bruising.  Travel history: No recent long distant travel.   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 Wendel and Dr. Maryjane   Thyroid  disease     Past Surgical History:  Procedure Laterality Date   BREAST CYST ASPIRATION Left 10 plus yrs   benign-twice   CATARACT EXTRACTION W/PHACO Left 03/20/2023   Procedure: CATARACT EXTRACTION PHACO AND INTRAOCULAR LENS PLACEMENT (IOC) LEFT VISION BLUE;  Surgeon: Enola Feliciano Hugger, MD;  Location: [facility];  Service: Ophthalmology;  Laterality: Left;  21.02   01:38.2   INTRAOPERATIVE TRANSTHORACIC ECHOCARDIOGRAM N/A 12/16/2023   Procedure: INTRAOPERATIVE TRANSTHORACIC ECHOCARDIOGRAM;  Surgeon: Wendel Lurena POUR, MD;  Location: [facility]VAS[facility] CV LAB;  Service: Cardiovascular;  Laterality: N/A;   IR RADIOLOGIST EVAL & MGMT  06/22/2024   IR RADIOLOGIST EVAL & MGMT  08/31/2024   IR VERTEBROPLASTY LUMBAR BX INC UNI/BIL INC/INJECT/IMAGING  06/29/2024   LEFT HEART CATH AND CORONARY ANGIOGRAPHY N/A 09/11/2020   Procedure: LEFT HEART CATH AND CORONARY ANGIOGRAPHY;  Surgeon: Ammon Blunt, MD;  Location: [facility] INVASIVE CV LAB;  Service: Cardiovascular;  Laterality: N/A;   PERIPHERAL VASCULAR INTERVENTION Right 10/24/2023   Procedure:  PERIPHERAL VASCULAR INTERVENTION;  Surgeon: Magda Debby SAILOR, MD;  Location: [facility] INVASIVE CV LAB;  Service: Cardiovascular;  Laterality: Right;  right axillary stent   RIGHT/LEFT HEART CATH AND CORONARY ANGIOGRAPHY N/A 10/23/2023   Procedure: RIGHT/LEFT HEART CATH AND CORONARY ANGIOGRAPHY;  Surgeon: Wendel Lurena POUR, MD;  Location: [facility] INVASIVE CV LAB;  Service:  Cardiovascular;  Laterality: N/A;   UPPER EXTREMITY ANGIOGRAPHY Right 10/24/2023   Procedure: Upper Extremity Angiography;  Surgeon: Magda Debby SAILOR, MD;  Location: [facility] INVASIVE CV LAB;  Service: Cardiovascular;  Laterality: Right;    Social History:  reports that she has quit smoking. Her smoking use included cigarettes. She has a 6 pack-year smoking history. She has never used smokeless tobacco. She reports that she does not currently use alcohol. She reports that she does not currently use drugs.  Family History:  Family History  Problem Relation Age of Onset   Breast cancer Neg Hx      Prior to Admission medications  Medication Sig Start Date End Date Taking? Authorizing Provider  acetaminophen  (TYLENOL ) 500 MG tablet Take 1 tablet (500 mg total) by mouth every 6 (six) hours as needed for mild pain (pain score 1-3), fever or moderate pain (pain score 4-6) (or Fever >/= 101). 11/03/24   Lenon Marien CROME, MD  ALPRAZolam  (XANAX ) 0.25 MG tablet Take 0.25 mg by mouth at bedtime as needed for sleep.    [provider]  amiodarone  (PACERONE ) 200 MG tablet Take 1 tablet (200 mg total) by mouth daily. 01/16/24   Sebastian Lamarr SAUNDERS, PA-C  apixaban  (ELIQUIS ) 5 MG TABS tablet Take 5 mg by mouth 2 (two) times daily.    [provider]  atorvastatin  (LIPITOR) 40 MG tablet Take 1 tablet (40 mg total) by mouth daily. 09/20/23   Alexander, Natalie, DO  cholecalciferol  (VITAMIN D3) 25 MCG (1000 UNIT) tablet Take 1,000 Units by mouth daily.    [provider]  dapagliflozin  propanediol (FARXIGA ) 10 MG TABS tablet Take 1 tablet (10 mg total) by mouth daily before breakfast. 05/26/24   Donette Ellouise LABOR, FNP  ferrous sulfate  325 (65 FE) MG tablet Take 1 tablet (325 mg total) by mouth daily with breakfast. 11/04/24 12/04/24  Lenon Marien CROME, MD  furosemide  (LASIX ) 20 MG tablet Take 10 mg by mouth daily.    [provider]  Ipratropium-Albuterol  (COMBIVENT  RESPIMAT) 20-100  MCG/ACT AERS respimat Inhale 1 puff into the lungs every 6 (six) hours as needed for wheezing.    [provider]  isosorbide  mononitrate (IMDUR ) 30 MG 24 hr tablet Take 30 mg by mouth daily.    [provider]  levalbuterol  (XOPENEX ) 1.25 MG/3ML nebulizer solution Take 1.25 mg by nebulization every 6 (six) hours as needed. 12/08/23 12/07/24  [provider]  levothyroxine  (SYNTHROID ) 75 MCG tablet Take 75 mcg by mouth daily before breakfast.    [provider]  lidocaine  (LIDODERM ) 5 % Place 1 patch onto the skin daily. 06/08/24   [provider]  mometasone -formoterol  (DULERA ) 200-5 MCG/ACT AERO Inhale 2 puffs into the lungs 2 (two) times daily. 12/17/23   Sebastian Lamarr SAUNDERS, PA-C  montelukast  (SINGULAIR ) 10 MG tablet Take 1 tablet by mouth at bedtime. 02/03/24 02/02/25  [provider]  pantoprazole  (PROTONIX ) 40 MG tablet Take 40 mg by mouth daily.    [provider]  potassium chloride  SA (KLOR-CON  M) 20 MEQ tablet Take 2 tablets (40 mEq total) by mouth daily. 06/25/24   Donette Ellouise LABOR, FNP  rOPINIRole  (REQUIP ) 0.25 MG tablet Take 1 tablet (0.25 mg total) by mouth at bedtime. 10/28/23   Leotis Bogus, MD  spironolactone  (ALDACTONE ) 25 MG tablet Take 1 tablet (25 mg total) by mouth daily. 06/18/24   Donette Ellouise LABOR, FNP  sucralfate  (CARAFATE ) 1 g tablet Take 1 g by mouth 4 (four) times daily -  with meals and at bedtime.    [provider]  valsartan  (DIOVAN ) 40 MG tablet Take 0.5 tablets (20 mg total) by mouth daily. 09/21/24   Donette Ellouise LABOR, FNP  vitamin B-12 (CYANOCOBALAMIN ) 1000 MCG tablet Take 1,000 mcg by mouth daily.    [provider]    Physical Exam: Vitals:   11/11/24 2025 11/11/24 2130 11/11/24 2230 11/11/24 2300  BP: (!) 179/89 (!) 170/81 (!) 147/76 (!) 164/83  Pulse: 81 76 73 81  Resp: (!) 24 (!) 26 (!) 24 (!) 21  Temp: 97.8 F (36.6 C)     SpO2: 100% 100% 100% 100%  Weight:      Height:        General: Not in acute distress HEENT:       Eyes: PERRL, EOMI, no jaundice       ENT: No discharge from the ears and nose, no pharynx injection, no tonsillar enlargement.        Neck: No JVD, no bruit, no mass felt. Heme: No neck lymph node enlargement. Cardiac: S1/S2, RRR, No murmurs, No gallops or rubs. Respiratory: Has decreased air movement bilaterally, has wheezing and rhonchi bilaterally GI: Soft, nondistended, nontender, no rebound pain, no organomegaly, BS present. GU: No hematuria Ext: No pitting leg edema bilaterally. 1+DP/PT pulse bilaterally. Musculoskeletal: No joint deformities, No joint redness or warmth, no limitation of ROM in spin. Skin: No rashes.  Neuro: Alert, oriented X3, cranial nerves II-XII grossly intact, moves all extremities normally. Psych: Patient is not psychotic, no suicidal or hemocidal ideation.  Labs on Admission: I have personally reviewed following labs and imaging studies  CBC: Recent Labs  Lab 11/11/24 2026  WBC 6.5  HGB 10.5*  HCT 34.5*  MCV 79.9*  PLT 359   Basic Metabolic Panel: Recent Labs  Lab 11/11/24 2026  NA 130*  K 3.9  CL 93*  CO2 23  GLUCOSE 101*  BUN 9  CREATININE 1.09*  CALCIUM  10.1   GFR: Estimated Creatinine Clearance: 38.4 mL/min (A) (by C-G formula based on SCr of 1.09 mg/dL (H)). Liver Function Tests: No results for input(s): AST, ALT, ALKPHOS, BILITOT, PROT, ALBUMIN in the last 168 hours. No results for input(s): LIPASE, AMYLASE in the last 168 hours. No results for input(s): AMMONIA in the last 168 hours. Coagulation Profile: No results for input(s): INR, PROTIME in the last 168 hours. Cardiac Enzymes: No results for input(s): CKTOTAL, CKMB, CKMBINDEX, TROPONINI in the last 168 hours. BNP (last 3 results) Recent Labs    12/26/23 1104 11/01/24 1440 11/11/24 2302  PROBNP 323 229.0 184.0   HbA1C: No results for input(s): HGBA1C in the last 72 hours. CBG: No results  for input(s): GLUCAP in the last 168 hours. Lipid Profile: No results for input(s): CHOL, HDL, LDLCALC, TRIG, CHOLHDL, LDLDIRECT in the last 72 hours. Thyroid  Function Tests: No results for input(s): TSH, T4TOTAL, FREET4, T3FREE, THYROIDAB in the last 72 hours. Anemia Panel: No results for input(s): VITAMINB12, FOLATE, FERRITIN, TIBC, IRON, RETICCTPCT in the last 72 hours. Urine analysis:    Component Value Date/Time   COLORURINE STRAW (A) 07/27/2024 1828   APPEARANCEUR CLEAR (A)  07/27/2024 1828   APPEARANCEUR Hazy 11/12/2012 1355   LABSPEC 1.010 07/27/2024 1828   LABSPEC 1.005 11/12/2012 1355   PHURINE 7.0 07/27/2024 1828   GLUCOSEU >=500 (A) 07/27/2024 1828   GLUCOSEU Negative 11/12/2012 1355   HGBUR NEGATIVE 07/27/2024 1828   BILIRUBINUR NEGATIVE 07/27/2024 1828   BILIRUBINUR Negative 11/12/2012 1355   KETONESUR NEGATIVE 07/27/2024 1828   PROTEINUR NEGATIVE 07/27/2024 1828   NITRITE NEGATIVE 07/27/2024 1828   LEUKOCYTESUR NEGATIVE 07/27/2024 1828   LEUKOCYTESUR Trace 11/12/2012 1355   Sepsis Labs: @LABRCNTIP (procalcitonin:4,lacticidven:4) ) Recent Results (from the past 240 hours)  Resp panel by RT-PCR (RSV, Flu A&B, Covid) Anterior Nasal Swab     Status: None   Collection Time: 11/11/24  8:26 PM   Specimen: Anterior Nasal Swab  Result Value Ref Range Status   SARS Coronavirus 2 by RT PCR NEGATIVE NEGATIVE Final    Comment: (NOTE) SARS-CoV-2 target nucleic acids are NOT DETECTED.  The SARS-CoV-2 RNA is generally detectable in upper respiratory specimens during the acute phase of infection. The lowest concentration of SARS-CoV-2 viral copies this assay can detect is 138 copies/mL. A negative result does not preclude SARS-Cov-2 infection and should not be used as the sole basis for treatment or other patient management decisions. A negative result may occur with  improper specimen collection/handling, submission of specimen other than  nasopharyngeal swab, presence of viral mutation(s) within the areas targeted by this assay, and inadequate number of viral copies(<138 copies/mL). A negative result must be combined with clinical observations, patient history, and epidemiological information. The expected result is Negative.  Fact Sheet for Patients:  bloggercourse.com  Fact Sheet for Healthcare Providers:  seriousbroker.it  This test is no t yet approved or cleared by the [place]  FDA and  has been authorized for detection and/or diagnosis of SARS-CoV-2 by FDA under an Emergency Use Authorization (EUA). This EUA will remain  in effect (meaning this test can be used) for the duration of the COVID-19 declaration under Section 564(b)(1) of the Act, 21 U.S.C.section 360bbb-3(b)(1), unless the authorization is terminated  or revoked sooner.       Influenza A by PCR NEGATIVE NEGATIVE Final   Influenza B by PCR NEGATIVE NEGATIVE Final    Comment: (NOTE) The Xpert Xpress SARS-CoV-2/FLU/RSV plus assay is intended as an aid in the diagnosis of influenza from Nasopharyngeal swab specimens and should not be used as a sole basis for treatment. Nasal washings and aspirates are unacceptable for Xpert Xpress SARS-CoV-2/FLU/RSV testing.  Fact Sheet for Patients: bloggercourse.com  Fact Sheet for Healthcare Providers: seriousbroker.it  This test is not yet approved or cleared by the [place]  FDA and has been authorized for detection and/or diagnosis of SARS-CoV-2 by FDA under an Emergency Use Authorization (EUA). This EUA will remain in effect (meaning this test can be used) for the duration of the COVID-19 declaration under Section 564(b)(1) of the Act, 21 U.S.C. section 360bbb-3(b)(1), unless the authorization is terminated or revoked.     Resp Syncytial Virus by PCR NEGATIVE NEGATIVE Final    Comment:  (NOTE) Fact Sheet for Patients: bloggercourse.com  Fact Sheet for Healthcare Providers: seriousbroker.it  This test is not yet approved or cleared by the [place]  FDA and has been authorized for detection and/or diagnosis of SARS-CoV-2 by FDA under an Emergency Use Authorization (EUA). This EUA will remain in effect (meaning this test can be used) for the duration of the COVID-19 declaration under Section 564(b)(1) of the Act, 21 U.S.C. section 360bbb-3(b)(1), unless  the authorization is terminated or revoked.  Performed at [facility], [address] [place], [place] [address]      Radiological Exams on Admission:   Assessment/Plan Principal Problem:   COPD exacerbation (HCC) Active Problems:   Chronic heart failure with preserved ejection fraction (HFpEF) (HCC)   Myocardial injury   Acute on chronic respiratory failure with hypoxia (HCC)   Hyponatremia   Essential hypertension   Dyslipidemia   Hypothyroidism   Atrial fibrillation, chronic (HCC)   Chronic kidney disease, stage 3a (HCC)   Iron deficiency anemia   Depression with anxiety   Overweight (BMI 25.0-29.9)   Assessment and Plan:   Acute on chronic respiratory failure with hypoxia due to COPD exacerbation [facility]): Patient has increased oxygen requirement from 2 to 4 L oxygen now with moderate acute respiratory distress.  Chest x-ray negative for infiltration.  Patient has wheezing and rhonchi, decreased air movement on auscultation, clinically consistent with COPD exacerbation.   -will admit patient to  PCU as inpt -Bronchodilators and prn Mucinex  -Solu-Medrol  80 mg IV daily after given 125 mg of Solu-Medrol  -Oral doxycycline 100 mg twice daily -Incentive spirometry -check RVP -Follow up sputum culture  Chronic heart failure with preserved ejection fraction (HFpEF) (HCC): 2D echo on 01/16/2024 showed EF seated at 65% with grade 2 diastolic  dysfunction.  Patient does not have leg edema or JVD.  proBNP 184.  CHF is compensated. - Continue Lasix  20 mg daily and spironolactone .  Myocardial injury: Troponin 30- > 30.  Likely demand ischemia. - Continue Lipitor - Patient is on Eliquis   Hyponatremia: Sodium 130, mental status normal. -Fluid restriction  Essential hypertension -IV hydralazine  as needed - Patient is Lasix , spironolactone , Imdur  - Switch Diovan  to irbesartan  Hospital  Dyslipidemia -Lipitor  Hypothyroidism -Synthroid   Atrial fibrillation, chronic (HCC): Heart rate 70-80s - Continue amiodarone  and Eliquis   Chronic kidney disease, stage 3a (HCC): Renal function stable.  Baseline creatinine 1.04 on 11/03/2024.  Her creatinine is 1.09, BUN 9, GFR 51. -Follow-up with BMP  Iron deficiency anemia: Hemoglobin stable, 10.5 (10.4 on 11/03/2024). -Continue iron supplement.  Depression with anxiety -Continue home Xanax , Requip   Overweight (BMI 25.0-29.9): Body weight 71.7 kg, BMI 27.99. - Patient is on Faxiga - Encourage losing weight - Healthy diet and exercise        DVT ppx: On Eliquis   Code Status: Full code     Family Communication:     not done, no family member is at bed side.        Disposition Plan:  Anticipate discharge back to previous environment  Consults called: None  Admission status and Level of care: Progressive: as inpt        Dispo: The patient is from: Home              Anticipated d/c is to: Home              Anticipated d/c date is: 2 days              Patient currently is not medically stable to d/c.    Severity of Illness:  The appropriate patient status for this patient is INPATIENT. Inpatient status is judged to be reasonable and necessary in order to provide the required intensity of service to ensure the patient's safety. The patient's presenting symptoms, physical exam findings, and initial radiographic and laboratory data in the context of their chronic  comorbidities is felt to place them at high risk for further clinical deterioration.  Furthermore, it is not anticipated that the patient will be medically stable for discharge from the hospital within 2 midnights of admission.   * I certify that at the point of admission it is my clinical judgment that the patient will require inpatient hospital care spanning beyond 2 midnights from the point of admission due to high intensity of service, high risk for further deterioration and high frequency of surveillance required.*       Date of Service 11/12/2024    [facility]   If 7PM-7AM, please contact night-coverage [email or web address] 11/12/2024, 12:46 AM     [1]  Allergies Allergen Reactions   Naproxen Sodium Anaphylaxis

## 2024-11-12 LAB — CBC
HCT: 33.9 % — ABNORMAL LOW (ref 36.0–46.0)
Hemoglobin: 10.5 g/dL — ABNORMAL LOW (ref 12.0–15.0)
MCH: 24.4 pg — ABNORMAL LOW (ref 26.0–34.0)
MCHC: 31 g/dL (ref 30.0–36.0)
MCV: 78.7 fL — ABNORMAL LOW (ref 80.0–100.0)
Platelets: 350 K/uL (ref 150–400)
RBC: 4.31 MIL/uL (ref 3.87–5.11)
RDW: 20.7 % — ABNORMAL HIGH (ref 11.5–15.5)
WBC: 7 K/uL (ref 4.0–10.5)
nRBC: 0 % (ref 0.0–0.2)

## 2024-11-12 LAB — RESPIRATORY PANEL BY PCR

## 2024-11-12 LAB — BASIC METABOLIC PANEL WITH GFR
Anion gap: 14 (ref 5–15)
BUN: 9 mg/dL (ref 8–23)
CO2: 23 mmol/L (ref 22–32)
Calcium: 9.8 mg/dL (ref 8.9–10.3)
Chloride: 93 mmol/L — ABNORMAL LOW (ref 98–111)
Creatinine, Ser: 0.98 mg/dL (ref 0.44–1.00)
GFR, Estimated: 58 mL/min — ABNORMAL LOW (ref >60)
Glucose, Bld: 170 mg/dL — ABNORMAL HIGH (ref 70–99)
Potassium: 4.1 mmol/L (ref 3.5–5.1)
Sodium: 129 mmol/L — ABNORMAL LOW (ref 135–145)

## 2024-11-12 MED ORDER — FUROSEMIDE 20 MG PO TABS
20.0000 mg | ORAL_TABLET | Freq: Every day | ORAL | Status: DC
  Administered 2024-11-12 – 2024-11-15 (×4): 20 mg via ORAL
  Filled 2024-11-12 (×4): qty 1

## 2024-11-12 MED ORDER — LORAZEPAM 2 MG/ML IJ SOLN
0.5000 mg | Freq: Four times a day (QID) | INTRAMUSCULAR | Status: DC | PRN
  Administered 2024-11-12 – 2024-11-13 (×2): 0.5 mg via INTRAVENOUS
  Filled 2024-11-12 (×2): qty 1

## 2024-11-12 MED ORDER — FERROUS SULFATE 325 (65 FE) MG PO TABS
325.0000 mg | ORAL_TABLET | Freq: Every day | ORAL | Status: DC
  Administered 2024-11-12 – 2024-11-15 (×4): 325 mg via ORAL
  Filled 2024-11-12 (×4): qty 1

## 2024-11-12 MED ORDER — SPIRONOLACTONE 25 MG PO TABS
25.0000 mg | ORAL_TABLET | Freq: Every day | ORAL | Status: DC
  Administered 2024-11-12 – 2024-11-15 (×4): 25 mg via ORAL
  Filled 2024-11-12 (×4): qty 1

## 2024-11-12 MED ORDER — VITAMIN D 25 MCG (1000 UNIT) PO TABS
1000.0000 [IU] | ORAL_TABLET | Freq: Every day | ORAL | Status: DC
  Administered 2024-11-12 – 2024-11-15 (×4): 1000 [IU] via ORAL
  Filled 2024-11-12 (×4): qty 1

## 2024-11-12 MED ORDER — IRBESARTAN 75 MG PO TABS
37.5000 mg | ORAL_TABLET | Freq: Every day | ORAL | Status: DC
  Administered 2024-11-12 – 2024-11-15 (×4): 37.5 mg via ORAL
  Filled 2024-11-12 (×4): qty 0.5

## 2024-11-12 MED ORDER — ATORVASTATIN CALCIUM 20 MG PO TABS
40.0000 mg | ORAL_TABLET | Freq: Every day | ORAL | Status: DC
  Administered 2024-11-12 – 2024-11-15 (×4): 40 mg via ORAL
  Filled 2024-11-12 (×4): qty 2

## 2024-11-12 MED ORDER — ISOSORBIDE MONONITRATE ER 30 MG PO TB24
30.0000 mg | ORAL_TABLET | Freq: Every day | ORAL | Status: DC
  Administered 2024-11-12 – 2024-11-15 (×4): 30 mg via ORAL
  Filled 2024-11-12 (×4): qty 1

## 2024-11-12 MED ORDER — VITAMIN B-12 1000 MCG PO TABS
1000.0000 ug | ORAL_TABLET | Freq: Every day | ORAL | Status: DC
  Administered 2024-11-12 – 2024-11-15 (×4): 1000 ug via ORAL
  Filled 2024-11-12 (×2): qty 1
  Filled 2024-11-12: qty 2
  Filled 2024-11-12: qty 1

## 2024-11-12 MED ORDER — ROPINIROLE HCL 0.25 MG PO TABS
0.2500 mg | ORAL_TABLET | Freq: Every day | ORAL | Status: DC
  Administered 2024-11-12 – 2024-11-14 (×3): 0.25 mg via ORAL
  Filled 2024-11-12 (×4): qty 1

## 2024-11-12 MED ORDER — PANTOPRAZOLE SODIUM 40 MG PO TBEC
40.0000 mg | DELAYED_RELEASE_TABLET | Freq: Every day | ORAL | Status: DC
  Administered 2024-11-12 – 2024-11-15 (×4): 40 mg via ORAL
  Filled 2024-11-12 (×4): qty 1

## 2024-11-12 MED ORDER — SUCRALFATE 1 G PO TABS
1.0000 g | ORAL_TABLET | Freq: Three times a day (TID) | ORAL | Status: DC
  Administered 2024-11-12 – 2024-11-15 (×14): 1 g via ORAL
  Filled 2024-11-12 (×14): qty 1

## 2024-11-12 MED ORDER — DOXYCYCLINE HYCLATE 100 MG PO TABS
100.0000 mg | ORAL_TABLET | Freq: Two times a day (BID) | ORAL | Status: DC
  Administered 2024-11-12 – 2024-11-15 (×6): 100 mg via ORAL
  Filled 2024-11-12 (×6): qty 1

## 2024-11-12 MED ORDER — DAPAGLIFLOZIN PROPANEDIOL 10 MG PO TABS
10.0000 mg | ORAL_TABLET | Freq: Every day | ORAL | Status: DC
  Administered 2024-11-12 – 2024-11-15 (×4): 10 mg via ORAL
  Filled 2024-11-12 (×4): qty 1

## 2024-11-12 MED ORDER — APIXABAN 5 MG PO TABS
5.0000 mg | ORAL_TABLET | Freq: Two times a day (BID) | ORAL | Status: DC
  Administered 2024-11-12 – 2024-11-15 (×7): 5 mg via ORAL
  Filled 2024-11-12 (×7): qty 1

## 2024-11-12 MED ORDER — TRAMADOL HCL 50 MG PO TABS
50.0000 mg | ORAL_TABLET | Freq: Four times a day (QID) | ORAL | Status: AC | PRN
  Administered 2024-11-12 – 2024-11-13 (×2): 50 mg via ORAL
  Filled 2024-11-12 (×2): qty 1

## 2024-11-12 MED ORDER — ALPRAZOLAM 0.25 MG PO TABS
0.2500 mg | ORAL_TABLET | Freq: Every evening | ORAL | Status: DC | PRN
  Administered 2024-11-15: 03:00:00 0.25 mg via ORAL
  Filled 2024-11-12: qty 1

## 2024-11-12 MED ORDER — MONTELUKAST SODIUM 10 MG PO TABS
10.0000 mg | ORAL_TABLET | Freq: Every day | ORAL | Status: DC
  Administered 2024-11-12 – 2024-11-14 (×3): 10 mg via ORAL
  Filled 2024-11-12 (×3): qty 1

## 2024-11-12 MED ORDER — LEVOTHYROXINE SODIUM 50 MCG PO TABS
75.0000 ug | ORAL_TABLET | Freq: Every day | ORAL | Status: DC
  Administered 2024-11-12 – 2024-11-15 (×4): 75 ug via ORAL
  Filled 2024-11-12 (×3): qty 1
  Filled 2024-11-12: qty 2

## 2024-11-12 MED ORDER — AMIODARONE HCL 200 MG PO TABS
200.0000 mg | ORAL_TABLET | Freq: Every day | ORAL | Status: DC
  Administered 2024-11-12 – 2024-11-15 (×4): 200 mg via ORAL
  Filled 2024-11-12 (×4): qty 1

## 2024-11-12 MED FILL — Prednisone Tab 50 MG: 50.0000 mg | ORAL | Qty: 1 | Status: AC

## 2024-11-12 NOTE — Progress Notes (Signed)
 PROGRESS NOTE  Linda Brady  FMW:[id number] DOB: 04-17-1941 DOA: 11/11/2024 PCP: Valora Lynwood FALCON, MD   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  11/11/2024: Presents ED for chest pain and shortness of breath.  Per ED documentation, EMS found the patient at 83% on her baseline 2 L nasal cannula.  Patient was placed on 4 L nasal cannula with adequate oxygenation and improvement.  Solu-Medrol  125 mg IV given.  12/11: [facility] admitted to [facility] for acute on chronic respiratory failure with hypoxia secondary to COPD exacerbation.  Patient noted to have wheezing and rhonchi with decreased air movement on auscultation, bronchodilators and as needed Mucinex  ordered.  Solu-Medrol  80 mg IV daily.  12/12: I assumed care of the patient.  Patient waiting in the ED for PCU bed.  Patient noted to be stable with improved oxygenation, and heart rate.  Transferred patient to telemetry.  Ativan  0.5 mg IV every 6 hours as needed for anxiety, 3 doses ordered  Solu-Medrol  80 mg IV was given today and discontinued for future doses.  Prednisone  50 mg daily starting on 12/13 ordered for 3 days. Noted daily lidocaine  patch in place and pt still endorsing tenderness. Tramadol  50 mg q6h prn for moderate, severe pain.  Pt has home PT/OT. PT/OT inpt is ordered on 12/11.  Assessment & Plan:   Principal Problem:   COPD exacerbation (HCC) Active Problems:   Hyponatremia   Chronic heart failure with preserved ejection fraction (HFpEF) (HCC)   Hypothyroidism   Essential hypertension   Dyslipidemia   Myocardial injury   Acute on chronic respiratory failure with hypoxia (HCC)   Atrial fibrillation, chronic (HCC)   Chronic kidney disease, stage 3a (HCC)   Iron deficiency anemia   Depression  with anxiety   Overweight (BMI 25.0-29.9)   Assessment and Plan:  * COPD exacerbation (HCC) Continue with DuoNebs 4 times daily Albuterol  2.5 mg inhalation every 4 hours as needed for wheezing and shortness of breath Montelukast  10 mg nightly Maintain SpO2 greater than 92%  12/12: Noted respiratory 20 pathogen panel has been ordered and pending collection at this time.  RSV/influenza A/B/COVID panel were negative.  Hyponatremia Persistent Secondary to furosemide  daily Continue with regular diet  Dyslipidemia Home atorvastatin  40 mg daily  Essential hypertension Currently controlled Home furosemide  20 mg IV daily, irbesartan  37.5 mg daily, Imdur  30 mg daily, spironolactone  25 mg daily Hydralazine  5 mg IV every 2 hours as needed for SBP greater than 165  Hypothyroidism Home levofloxacin 75 mcg daily before breakfast  DVT prophylaxis: Apixaban  5 mg p.o. twice daily Code Status: Full code Family Communication:  Disposition Plan: Pending clinical course, anticipate discharge on 12/13 Level of care: Telemetry  Consultants:  PT, OT  Procedures:  None at this time  Antimicrobials: Doxycycline 100 mg p.o. twice daily initiated on 11/11/2024.  Subjective:  At bedside, patient was able to tell me her first and last name, her age, location of hospital, and current calendar year.  She reports she still having trouble breathing especially with ambulation to the toilet.  Noted that PT, OT has been ordered already.  Patient she would like another day in the hospital.  Patient currently on her home oxygen at baseline, 2 L nasal cannula.  Objective: Vitals:   11/12/24 1000 11/12/24 1036 11/12/24 1100 11/12/24 1300  BP: 130/78  127/79 102/61  Pulse: 87  82 85  Resp: (!) 22  (!) 34 (!) 23  Temp:  97.9 F (36.6 C)    TempSrc:      SpO2: 98%  100% 99%  Weight:      Height:       No intake or output data in the 24 hours ending 11/12/24 1427 Filed Weights   11/11/24 2024   Weight: 71.7 kg   Examination:  General exam: Appears anxious with mild increased work of breathing. Respiratory system: Clear to auscultation.  Increased respiratory effort. Cardiovascular system: S1 & S2 heard, RRR. No JVD, murmurs, rubs, gallops or clicks. No pedal edema. Gastrointestinal system: Abdomen is nondistended, soft and nontender. No organomegaly or masses felt. Normal bowel sounds heard. Central nervous system: Alert and oriented. No focal neurological deficits. Extremities: Symmetric 5 x 5 power. Skin: No rashes, lesions or ulcers Psychiatry: Judgement and insight appear normal. Mood & affect appropriate.   Data Reviewed: I have personally reviewed following labs and imaging studies  CBC: Recent Labs  Lab 11/11/24 2026 11/12/24 0335  WBC 6.5 7.0  HGB 10.5* 10.5*  HCT 34.5* 33.9*  MCV 79.9* 78.7*  PLT 359 350   Basic Metabolic Panel: Recent Labs  Lab 11/11/24 2026 11/12/24 0335  NA 130* 129*  K 3.9 4.1  CL 93* 93*  CO2 23 23  GLUCOSE 101* 170*  BUN 9 9  CREATININE 1.09* 0.98  CALCIUM  10.1 9.8   GFR: Estimated Creatinine Clearance: 42.7 mL/min (by C-G formula based on SCr of 0.98 mg/dL).  BNP (last 3 results) Recent Labs    12/26/23 1104 11/01/24 1440 11/11/24 2302  PROBNP 323 229.0 184.0   Recent Results (from the past 240 hours)  Resp panel by RT-PCR (RSV, Flu A&B, Covid) Anterior Nasal Swab     Status: None   Collection Time: 11/11/24  8:26 PM   Specimen: Anterior Nasal Swab  Result Value Ref Range Status   SARS Coronavirus 2 by RT PCR NEGATIVE NEGATIVE Final    Comment: (NOTE) SARS-CoV-2 target nucleic acids are NOT DETECTED.  The SARS-CoV-2 RNA is generally detectable in upper respiratory specimens during the acute phase of infection. The lowest concentration of SARS-CoV-2 viral copies this assay can detect is 138 copies/mL. A negative result does not preclude SARS-Cov-2 infection and should not be used as the sole basis for  treatment or other patient management decisions. A negative result may occur with  improper specimen collection/handling, submission of specimen other than nasopharyngeal swab, presence of viral mutation(s) within the areas targeted by this assay, and inadequate number of viral copies(<138 copies/mL). A negative result must be combined with clinical observations, patient history, and epidemiological information. The expected result is Negative.  Fact Sheet for Patients:  bloggercourse.com  Fact Sheet for Healthcare Providers:  seriousbroker.it  This test is no t yet approved or cleared by the [place]  FDA and  has been authorized for detection and/or diagnosis of SARS-CoV-2 by FDA under an Emergency Use Authorization (EUA). This EUA will remain  in effect (meaning this test can be used) for the duration of the COVID-19 declaration under Section 564(b)(1) of the Act, 21 U.S.C.section 360bbb-3(b)(1), unless the authorization is terminated  or revoked sooner.       Influenza A by PCR NEGATIVE NEGATIVE Final   Influenza B by PCR NEGATIVE NEGATIVE Final    Comment: (NOTE) The Xpert Xpress SARS-CoV-2/FLU/RSV plus assay is intended as an aid in the diagnosis of influenza  from Nasopharyngeal swab specimens and should not be used as a sole basis for treatment. Nasal washings and aspirates are unacceptable for Xpert Xpress SARS-CoV-2/FLU/RSV testing.  Fact Sheet for Patients: bloggercourse.com  Fact Sheet for Healthcare Providers: seriousbroker.it  This test is not yet approved or cleared by the [place]  FDA and has been authorized for detection and/or diagnosis of SARS-CoV-2 by FDA under an Emergency Use Authorization (EUA). This EUA will remain in effect (meaning this test can be used) for the duration of the COVID-19 declaration under Section 564(b)(1) of the Act, 21  U.S.C. section 360bbb-3(b)(1), unless the authorization is terminated or revoked.     Resp Syncytial Virus by PCR NEGATIVE NEGATIVE Final    Comment: (NOTE) Fact Sheet for Patients: bloggercourse.com  Fact Sheet for Healthcare Providers: seriousbroker.it  This test is not yet approved or cleared by the [place]  FDA and has been authorized for detection and/or diagnosis of SARS-CoV-2 by FDA under an Emergency Use Authorization (EUA). This EUA will remain in effect (meaning this test can be used) for the duration of the COVID-19 declaration under Section 564(b)(1) of the Act, 21 U.S.C. section 360bbb-3(b)(1), unless the authorization is terminated or revoked.  Performed at [facility], [address] [place], [place] [address]     Radiology Studies: DG Chest 1 View Result Date: 11/11/2024 EXAM: 1 VIEW(S) XRAY OF THE CHEST 11/11/2024 08:47:00 PM COMPARISON: 11/01/2024 CLINICAL HISTORY: Shortness of breath FINDINGS: LINES, TUBES AND DEVICES: Right upper extremity vascular stent noted. Transcatheter aortic valve replacement noted. LUNGS AND PLEURA: No focal pulmonary opacity. No pleural effusion. No pneumothorax. HEART AND MEDIASTINUM: Aortic atherosclerosis. Transcatheter aortic valve replacement noted. BONES AND SOFT TISSUES: Small hiatal hernia. No acute osseous abnormality. IMPRESSION: 1. No acute cardiopulmonary abnormality. Electronically signed by: Pinkie Pebbles MD 11/11/2024 08:49 PM EST RP Workstation: [id number]   Scheduled Meds:  amiodarone   200 mg Oral Daily   apixaban   5 mg Oral BID   atorvastatin   40 mg Oral Daily   cholecalciferol   1,000 Units Oral Daily   cyanocobalamin   1,000 mcg Oral Daily   dapagliflozin  propanediol  10 mg Oral Daily   doxycycline  100 mg Oral Q12H   ferrous sulfate   325 mg Oral Q breakfast   furosemide   20 mg Oral Daily   ipratropium-albuterol   3 mL Nebulization Q4H    irbesartan   37.5 mg Oral Daily   isosorbide  mononitrate  30 mg Oral Daily   levothyroxine   75 mcg Oral QAC breakfast   lidocaine   1 patch Transdermal Q24H   montelukast   10 mg Oral QHS   pantoprazole   40 mg Oral Daily   [START ON 11/13/2024] predniSONE   50 mg Oral Q breakfast   rOPINIRole   0.25 mg Oral QHS   spironolactone   25 mg Oral Daily   sucralfate   1 g Oral TID WC & HS    LOS: 1 day   Time spent: 50 mins  Dr. Sherre [facility] If 7PM-7AM, please contact night-coverage 11/12/2024, 2:27 PM

## 2024-11-12 NOTE — Assessment & Plan Note (Addendum)
 Persistent Secondary to furosemide  daily Continue with regular diet

## 2024-11-12 NOTE — ED Notes (Signed)
 This tech assisted pt to toilet. Pt able to ambulate with a slow steady gait using a walker. Pt back in bed with no further needs at this time.

## 2024-11-12 NOTE — Evaluation (Addendum)
 Occupational Therapy Evaluation Patient Details Name: Linda Brady MRN: [id number] DOB: [birth year] Today's Date: 11/12/2024   History of Present Illness   Pt is a 83 y.o. female who presents with SOB, recently 1 week ago found with new compression fracture of L1, per MD pt can wear TLSO for comfort if desired. Current MD assessment: Acute on chronic respiratory failure with hypoxia due to COPD exacerbation. PMH of  HTN, HLD, COPD on 2 L oxygen, dCHF, hypothyroidism, CKD stage IIIa, A-fib on Eliquis      Clinical Impressions Pt was seen for OT evaluation this date. PTA, pt lives alone with daughter providing intermittent assist for transportation/meals/med mgmt. Pt uses RW for mobility and is able to perform ADLs at MOD I level. Pt presents with deficits in strength and activity tolerance affecting safe and optimal ADL completion. Pt currently requires MOD I with increased time/effort for bed mobility. STS transfers and ADL transfers/mobility using RW with supervision/SBA. Pt with chronic back pain, but recently found with new L1 compression fx and TLSO recommended for comfort with pt declining at the time d/t claustraphobia. However, she now states she has been looking at them and plans to order one. Re-edu on spinal precautions and demo back good carryover during ADLs. Sp02 >90% on 2L throughout session with pt reporting fatigue and mild SOB with minimal activity. Will follow acutely, but anticipate pt will return home with [facility] services upon DC. Pt inquiring about walker skis for the back of her RW to maximize her safety at home.     If plan is discharge home, recommend the following:   A little help with walking and/or transfers;A little help with bathing/dressing/bathroom;Assist for transportation;Help with stairs or ramp for entrance     Functional Status Assessment   Patient has had a recent decline in their functional status and demonstrates the ability to make significant  improvements in function in a reasonable and predictable amount of time.     Equipment Recommendations   BSC/3in1     Recommendations for Other Services         Precautions/Restrictions   Precautions Precautions: Back;Fall Recall of Precautions/Restrictions: Impaired Precaution/Restrictions Comments: spinal precautions for comfort , pt not currently wearing a TLSO d/t claustraphobia and it was prescribed for comfort Required Braces or Orthoses: Spinal Brace Spinal Brace: Thoracolumbosacral orthotic;Other (comment) (use for comfort, pt declines) Restrictions Weight Bearing Restrictions Per Provider Order: No     Mobility Bed Mobility Overal bed mobility: Modified Independent             General bed mobility comments: moves easily in bed and for supine<>sit with mild effort d/t SOB    Transfers Overall transfer level: Needs assistance Equipment used: Rolling walker (2 wheels) Transfers: Sit to/from Stand Sit to Stand: Supervision           General transfer comment: stands from EOB and toilet and ambulates using RW with supervision/SBA      Balance Overall balance assessment: Needs assistance Sitting-balance support: No upper extremity supported, Feet supported Sitting balance-Leahy Scale: Good     Standing balance support: Bilateral upper extremity supported Standing balance-Leahy Scale: Fair Standing balance comment: no LOB using RW during ambulation                           ADL either performed or assessed with clinical judgement   ADL Overall ADL's : Needs assistance/impaired  Toilet Transfer: Cueing for sequencing;Cueing for safety;Rolling walker (2 wheels);Contact guard assist;Comfort height toilet   Toileting- Clothing Manipulation and Hygiene: Supervision/safety;Sitting/lateral lean;Cueing for back precautions       Functional mobility during ADLs: Supervision/safety;Rolling walker (2  wheels) General ADL Comments: ADL transfer using RW ~15 ft with SBA/supervision, managed all aspects of toileting with SBA/supervision     Vision         Perception         Praxis         Pertinent Vitals/Pain Pain Assessment Pain Assessment: Faces Faces Pain Scale: Hurts little more Pain Location: back (chronic) and new l1 compression fx Pain Descriptors / Indicators: Aching Pain Intervention(s): Monitored during session, Repositioned, Limited activity within patient's tolerance     Extremity/Trunk Assessment Upper Extremity Assessment Upper Extremity Assessment: Overall WFL for tasks assessed   Lower Extremity Assessment Lower Extremity Assessment: Overall WFL for tasks assessed;Generalized weakness       Communication Communication Communication: Impaired Factors Affecting Communication: Hearing impaired   Cognition Arousal: Alert Behavior During Therapy: WFL for tasks assessed/performed Cognition: No apparent impairments                               Following commands: Intact       Cueing  General Comments   Cueing Techniques: Verbal cues  sp02 90% and above on 2L throughout session   Exercises Other Exercises Other Exercises: Edu on pacing, ECS, PLB and back precautions during ADL performance to prevent overexertion and maximize safety/IND.   Shoulder Instructions      Home Living Family/patient expects to be discharged to:: Private residence Living Arrangements: Alone Available Help at Discharge: Family;Available PRN/intermittently Type of Home: House Home Access: Stairs to enter [employer] of Steps: 4 Entrance Stairs-Rails: Right Home Layout: One level     Bathroom Shower/Tub: Walk-in shower;Sponge bathes at baseline   [employer]: Standard Bathroom Accessibility: Yes   Home Equipment: Shower seat - built [employer] (2 wheels);Grab bars - tub/shower          Prior Functioning/Environment Prior  Level of Function : Independent/Modified Independent             Mobility Comments: uses RW due to chronic LBP ADLs Comments: ind with ADLs. Daughter drives her to/from appointments and does grocery shopping    OT Problem List: Cardiopulmonary status limiting activity;Decreased activity tolerance;Impaired balance (sitting and/or standing);Decreased strength;Decreased safety awareness;Decreased knowledge of use of DME or AE;Decreased knowledge of precautions   OT Treatment/Interventions: Self-care/ADL training;Therapeutic exercise;Energy conservation;DME and/or AE instruction;Therapeutic activities;Patient/family education;Balance training      OT Goals(Current goals can be found in the care plan section)   Acute Rehab OT Goals Patient Stated Goal: improve breathing OT Goal Formulation: With patient Time For Goal Achievement: 11/26/24 Potential to Achieve Goals: Good ADL Goals Pt Will Perform Lower Body Dressing: with modified independence;sitting/lateral leans;sit to/from stand Pt Will Transfer to Toilet: with modified independence;ambulating Additional ADL Goal #1: Pt will return demo use of spinal precautions and ECS during ADL performance for improved pain control and prevention of overexertion.   OT Frequency:  Min 2X/week    Co-evaluation              AM-PAC OT 6 Clicks Daily Activity     Outcome Measure Help from another person eating meals?: None Help from another person taking care of personal grooming?: None Help from another person toileting, which includes  using toliet, bedpan, or urinal?: A Little Help from another person bathing (including washing, rinsing, drying)?: A Little Help from another person to put on and taking off regular upper body clothing?: None Help from another person to put on and taking off regular lower body clothing?: A Little 6 Click Score: 21   End of Session Equipment Utilized During Treatment: Oxygen;Rolling walker (2  wheels) Nurse Communication: Mobility status  Activity Tolerance: Patient tolerated treatment well;No increased pain Patient left: in bed;with call bell/phone within reach;with nursing/sitter in room  OT Visit Diagnosis: Muscle weakness (generalized) (M62.81);Unsteadiness on feet (R26.81)                Time: 9243-9178 OT Time Calculation (min): 25 min Charges:  OT General Charges $OT Visit: 1 Visit OT Evaluation $OT Eval Low Complexity: 1 Low OT Treatments $Self Care/Home Management : 8-22 mins Yoona Ishii Chrismon, OTR/L 11/12/2024, 8:45 AM  Lorriann Hansmann E Chrismon 11/12/2024, 8:41 AM

## 2024-11-12 NOTE — Assessment & Plan Note (Signed)
 Currently controlled Home furosemide  20 mg IV daily, irbesartan  37.5 mg daily, Imdur  30 mg daily, spironolactone  25 mg daily Hydralazine  5 mg IV every 2 hours as needed for SBP greater than 165

## 2024-11-12 NOTE — Assessment & Plan Note (Signed)
 Home levofloxacin 75 mcg daily before breakfast

## 2024-11-12 NOTE — Hospital Course (Addendum)
 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  11/11/2024: Presents ED for chest pain and shortness of breath.  Per ED documentation, EMS found the patient at 83% on her baseline 2 L nasal cannula.  Patient was placed on 4 L nasal cannula with adequate oxygenation and improvement.  Solu-Medrol  125 mg IV given.  12/11: Patient admitted to [facility]ist service for acute on chronic respiratory failure with hypoxia secondary to COPD exacerbation.  Patient noted to have wheezing and rhonchi with decreased air movement on auscultation, bronchodilators and as needed Mucinex  ordered.  Solu-Medrol  80 mg IV daily.  12/12: I assumed care of the patient.  Patient waiting in the ED for PCU bed.  Patient noted to be stable with improved oxygenation, and heart rate.  Transferred patient to telemetry.  Ativan  0.5 mg IV every 6 hours as needed for anxiety, 3 doses ordered  Solu-Medrol  80 mg IV was given today and discontinued for future doses.  Prednisone  50 mg daily starting on 12/13 ordered for 3 days. Noted daily lidocaine  patch in place and pt still endorsing tenderness. Tramadol  50 mg q6h prn for moderate, severe pain.  Pt has home PT/OT. PT/OT inpt is ordered on 12/11. DME for nebulizer ordered.  12/14: Patient still feels weak and short of breath. 12/15: Patient feels better and want to go home.  She will be discharged home on home medications plus doxycycline  and home care.

## 2024-11-12 NOTE — Assessment & Plan Note (Signed)
-   Home atorvastatin 40 mg daily

## 2024-11-12 NOTE — ED Notes (Signed)
 Pt rate on monitor between 70-90 BPM. EKG obtained and MD made aware of change. Pt remains on diltiazem  drip per MD. Charlet out to pharmacy and MD per [facility] order regarding diltiazem  drip.

## 2024-11-12 NOTE — ED Notes (Signed)
 Pt at bedside

## 2024-11-12 NOTE — Assessment & Plan Note (Addendum)
 Continue with DuoNebs 4 times daily Albuterol  2.5 mg inhalation every 4 hours as needed for wheezing and shortness of breath Montelukast  10 mg nightly Maintain SpO2 greater than 92%  12/12: Noted respiratory 20 pathogen panel has been ordered and pending collection at this time.  RSV/influenza A/B/COVID panel were negative.  12/13: Patient required a dose of lorazepam  0.5 mg IV at 955 due to anxiety.  Prednisone  50 mg daily.  DuoNebs 3 times daily, 4 doses ordered.  Continue albuterol  and inhalation every 4 hours as needed for wheezing and sob.  DME for nebulizer ordered.

## 2024-11-13 MED ORDER — IPRATROPIUM-ALBUTEROL 0.5-2.5 (3) MG/3ML IN SOLN
3.0000 mL | Freq: Three times a day (TID) | RESPIRATORY_TRACT | Status: DC
  Administered 2024-11-13 – 2024-11-14 (×3): 3 mL via RESPIRATORY_TRACT
  Filled 2024-11-13 (×3): qty 3

## 2024-11-13 MED ORDER — LORAZEPAM 0.5 MG PO TABS
0.5000 mg | ORAL_TABLET | Freq: Four times a day (QID) | ORAL | Status: AC | PRN
  Administered 2024-11-13 – 2024-11-14 (×2): 0.5 mg via ORAL
  Filled 2024-11-13 (×2): qty 1

## 2024-11-13 MED ADMIN — Prednisone Tab 50 MG: 50 mg | ORAL | NDC 60687085411

## 2024-11-13 NOTE — Plan of Care (Signed)
°  Problem: Education: Goal: Knowledge of General Education information will improve Description: Including pain rating scale, medication(s)/side effects and non-pharmacologic comfort measures Outcome: Progressing   Problem: Health Behavior/Discharge Planning: Goal: Ability to manage health-related needs will improve Outcome: Progressing   Problem: Clinical Measurements: Goal: Ability to maintain clinical measurements within normal limits will improve Outcome: Progressing   Problem: Clinical Measurements: Goal: Cardiovascular complication will be avoided Outcome: Progressing   Problem: Activity: Goal: Risk for activity intolerance will decrease Outcome: Progressing   Problem: Elimination: Goal: Will not experience complications related to bowel motility Outcome: Progressing

## 2024-11-13 NOTE — Progress Notes (Addendum)
 PROGRESS NOTE  Linda Brady  FMW:[id number] DOB: Jun 14, 1941 DOA: 11/11/2024 PCP: Valora Lynwood FALCON, MD   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  11/11/2024: Presents ED for chest pain and shortness of breath.  Per ED documentation, EMS found the patient at 83% on her baseline 2 L nasal cannula.  Patient was placed on 4 L nasal cannula with adequate oxygenation and improvement.  Solu-Medrol  125 mg IV given.  12/11: [facility] admitted to [facility] service for acute on chronic respiratory failure with hypoxia secondary to COPD exacerbation.  Patient noted to have wheezing and rhonchi with decreased air movement on auscultation, bronchodilators and as needed Mucinex  ordered.  Solu-Medrol  80 mg IV daily.  12/12: I assumed care of the patient.  Patient waiting in the ED for PCU bed.  Patient noted to be stable with improved oxygenation, and heart rate.  Transferred patient to telemetry.  Ativan  0.5 mg IV every 6 hours as needed for anxiety, 3 doses ordered  Solu-Medrol  80 mg IV was given today and discontinued for future doses.  Prednisone  50 mg daily starting on 12/13 ordered for 3 days. Noted daily lidocaine  patch in place and pt still endorsing tenderness. Tramadol  50 mg q6h prn for moderate, severe pain.  Pt has home PT/OT. PT/OT inpt is ordered on 12/11. DME for nebulizer ordered.  Assessment & Plan:   Principal Problem:   COPD exacerbation (HCC) Active Problems:   Hyponatremia   Anxiety   Chronic heart failure with preserved ejection fraction (HFpEF) (HCC)   Atrial fibrillation, chronic (HCC)   Hypothyroidism   Essential hypertension   Dyslipidemia   Depression with anxiety   Chronic kidney disease, stage 3a (HCC)   Acute on chronic respiratory failure with hypoxia  (HCC)   Iron deficiency anemia   Overweight (BMI 25.0-29.9)   Assessment and Plan:  * COPD exacerbation (HCC) Continue with DuoNebs 4 times daily Albuterol  2.5 mg inhalation every 4 hours as needed for wheezing and shortness of breath Montelukast  10 mg nightly Maintain SpO2 greater than 92%  12/12: Noted respiratory 20 pathogen panel has been ordered and pending collection at this time.  RSV/influenza A/B/COVID panel were negative.  12/13: Patient required a dose of lorazepam  0.5 mg IV at 955 due to anxiety.  Prednisone  50 mg daily.  DuoNebs 3 times daily, 4 doses ordered.  Continue albuterol  and inhalation every 4 hours as needed for wheezing and sob.  DME for nebulizer ordered.  Chronic heart failure with preserved ejection fraction (HFpEF) (HCC) Euvolemic at this time  Anxiety 12/13: Patient had an episode of shortness of breath and felt appropriately anxious Ativan  0.5 mg IV as needed dose was given. I discontinued Ativan  0.5 mg IV.  Ativan  0.5 mg p.o. every 6 hours as needed for anxiety, 2 days ordered.  Hyponatremia Persistent Secondary to furosemide  daily Continue with regular diet  Atrial fibrillation, chronic (HCC) Apixaban  5 mg p.o. twice daily  Chronic kidney disease, stage 3a (HCC) Stable  Dyslipidemia Home atorvastatin  40 mg daily  Essential hypertension Currently controlled Home furosemide  20 mg IV daily, irbesartan  37.5 mg daily, Imdur  30 mg daily, spironolactone  25 mg daily Hydralazine  5 mg IV every 2 hours as needed for SBP greater than 165  Hypothyroidism Home levofloxacin 75 mcg daily before breakfast  DVT prophylaxis: Apixaban  5 mg p.o. twice daily Code  Status: Full code Family Communication: A phone call was offered, patient declined Disposition Plan: Pending clinical course.  Possible ready on 12/14 or 12/15. Level of care: Telemetry  Consultants:  PT, OT  Procedures:  None at this time  Antimicrobials: 12/13: Doxycycline  100 mg p.o.  twice daily, day 2 of antibiotic  Subjective:  At bedside, patient awake alert and sitting in her chair.  She appears anxious.  She reports she just had anxiety medication given.  She was able to tell me her first and last name, her age, current calendar year, current location of [facility].  She reports she did have an episode of shortness of breath that was increased this morning and an anxiety attack.  Objective: Vitals:   11/13/24 0412 11/13/24 0500 11/13/24 0800 11/13/24 1340  BP: 132/63  128/79   Pulse: 81  69   Resp: 18  18   Temp: 98 F (36.7 C)  98 F (36.7 C)   TempSrc:      SpO2: 98%  100% 97%  Weight:  72.3 kg    Height:        Intake/Output Summary (Last 24 hours) at 11/13/2024 1601 Last data filed at 11/13/2024 1137 Gross per 24 hour  Intake 720 ml  Output 850 ml  Net -130 ml   Filed Weights   11/11/24 2024 11/13/24 0500  Weight: 71.7 kg 72.3 kg   Examination:  General exam: Appears calm and comfortable  Respiratory system: Clear to auscultation. Respiratory effort normal. Cardiovascular system: S1 & S2 heard, RRR. No JVD, murmurs, rubs, gallops or clicks. No pedal edema. Gastrointestinal system: Abdomen is nondistended, soft and nontender. No organomegaly or masses felt. Normal bowel sounds heard. Central nervous system: Alert and oriented. No focal neurological deficits. Extremities: Symmetric 5 x 5 power. Skin: No rashes, lesions or ulcers Psychiatry: Judgement and insight appear normal. Mood & affect appropriate.   Data Reviewed: I have personally reviewed following labs and imaging studies  CBC: Recent Labs  Lab 11/11/24 2026 11/12/24 0335  WBC 6.5 7.0  HGB 10.5* 10.5*  HCT 34.5* 33.9*  MCV 79.9* 78.7*  PLT 359 350   Basic Metabolic Panel: Recent Labs  Lab 11/11/24 2026 11/12/24 0335  NA 130* 129*  K 3.9 4.1  CL 93* 93*  CO2 23 23  GLUCOSE 101* 170*  BUN 9 9  CREATININE 1.09* 0.98  CALCIUM  10.1 9.8    GFR: Estimated Creatinine Clearance: 42.9 mL/min (by C-G formula based on SCr of 0.98 mg/dL).  BNP (last 3 results) Recent Labs    12/26/23 1104 11/01/24 1440 11/11/24 2302  PROBNP 323 229.0 184.0   Recent Results (from the past 240 hours)  Resp panel by RT-PCR (RSV, Flu A&B, Covid) Anterior Nasal Swab     Status: None   Collection Time: 11/11/24  8:26 PM   Specimen: Anterior Nasal Swab  Result Value Ref Range Status   SARS Coronavirus 2 by RT PCR NEGATIVE NEGATIVE Final    Comment: (NOTE) SARS-CoV-2 target nucleic acids are NOT DETECTED.  The SARS-CoV-2 RNA is generally detectable in upper respiratory specimens during the acute phase of infection. The lowest concentration of SARS-CoV-2 viral copies this assay can detect is 138 copies/mL. A negative result does not preclude SARS-Cov-2 infection and should not be used as the sole basis for treatment or other patient management decisions. A negative result may occur with  improper specimen collection/handling, submission of specimen other than nasopharyngeal swab, presence of viral mutation(s) within  the areas targeted by this assay, and inadequate number of viral copies(<138 copies/mL). A negative result must be combined with clinical observations, patient history, and epidemiological information. The expected result is Negative.  Fact Sheet for Patients:  bloggercourse.com  Fact Sheet for Healthcare Providers:  seriousbroker.it  This test is no t yet approved or cleared by the [place]  FDA and  has been authorized for detection and/or diagnosis of SARS-CoV-2 by FDA under an Emergency Use Authorization (EUA). This EUA will remain  in effect (meaning this test can be used) for the duration of the COVID-19 declaration under Section 564(b)(1) of the Act, 21 U.S.C.section 360bbb-3(b)(1), unless the authorization is terminated  or revoked sooner.       Influenza A by  PCR NEGATIVE NEGATIVE Final   Influenza B by PCR NEGATIVE NEGATIVE Final    Comment: (NOTE) The Xpert Xpress SARS-CoV-2/FLU/RSV plus assay is intended as an aid in the diagnosis of influenza from Nasopharyngeal swab specimens and should not be used as a sole basis for treatment. Nasal washings and aspirates are unacceptable for Xpert Xpress SARS-CoV-2/FLU/RSV testing.  Fact Sheet for Patients: bloggercourse.com  Fact Sheet for Healthcare Providers: seriousbroker.it  This test is not yet approved or cleared by the [place]  FDA and has been authorized for detection and/or diagnosis of SARS-CoV-2 by FDA under an Emergency Use Authorization (EUA). This EUA will remain in effect (meaning this test can be used) for the duration of the COVID-19 declaration under Section 564(b)(1) of the Act, 21 U.S.C. section 360bbb-3(b)(1), unless the authorization is terminated or revoked.     Resp Syncytial Virus by PCR NEGATIVE NEGATIVE Final    Comment: (NOTE) Fact Sheet for Patients: bloggercourse.com  Fact Sheet for Healthcare Providers: seriousbroker.it  This test is not yet approved or cleared by the [place]  FDA and has been authorized for detection and/or diagnosis of SARS-CoV-2 by FDA under an Emergency Use Authorization (EUA). This EUA will remain in effect (meaning this test can be used) for the duration of the COVID-19 declaration under Section 564(b)(1) of the Act, 21 U.S.C. section 360bbb-3(b)(1), unless the authorization is terminated or revoked.  Performed at [facility] [address] [place], [place] [address]   Respiratory (~20 pathogens) panel by PCR     Status: None   Collection Time: 11/11/24 10:41 PM   Specimen: Nasopharyngeal Swab; Respiratory  Result Value Ref Range Status   Adenovirus NOT DETECTED NOT DETECTED Final   Coronavirus 229E NOT  DETECTED NOT DETECTED Final    Comment: (NOTE) The Coronavirus on the Respiratory Panel, DOES NOT test for the novel  Coronavirus (2019 nCoV)    Coronavirus HKU1 NOT DETECTED NOT DETECTED Final   Coronavirus NL63 NOT DETECTED NOT DETECTED Final   Coronavirus OC43 NOT DETECTED NOT DETECTED Final   Metapneumovirus NOT DETECTED NOT DETECTED Final   Rhinovirus / Enterovirus NOT DETECTED NOT DETECTED Final   Influenza A NOT DETECTED NOT DETECTED Final   Influenza B NOT DETECTED NOT DETECTED Final   Parainfluenza Virus 1 NOT DETECTED NOT DETECTED Final   Parainfluenza Virus 2 NOT DETECTED NOT DETECTED Final   Parainfluenza Virus 3 NOT DETECTED NOT DETECTED Final   Parainfluenza Virus 4 NOT DETECTED NOT DETECTED Final   Respiratory Syncytial Virus NOT DETECTED NOT DETECTED Final   Bordetella pertussis NOT DETECTED NOT DETECTED Final   Bordetella Parapertussis NOT DETECTED NOT DETECTED Final   Chlamydophila pneumoniae NOT DETECTED NOT DETECTED Final   Mycoplasma pneumoniae NOT DETECTED NOT  DETECTED Final    Comment: Performed at [facility], [address] [place], [place] [address]    Radiology Studies: DG Chest 1 View Result Date: 11/11/2024 EXAM: 1 VIEW(S) XRAY OF THE CHEST 11/11/2024 08:47:00 PM COMPARISON: 11/01/2024 CLINICAL HISTORY: Shortness of breath FINDINGS: LINES, TUBES AND DEVICES: Right upper extremity vascular stent noted. Transcatheter aortic valve replacement noted. LUNGS AND PLEURA: No focal pulmonary opacity. No pleural effusion. No pneumothorax. HEART AND MEDIASTINUM: Aortic atherosclerosis. Transcatheter aortic valve replacement noted. BONES AND SOFT TISSUES: Small hiatal hernia. No acute osseous abnormality. IMPRESSION: 1. No acute cardiopulmonary abnormality. Electronically signed by: Pinkie Pebbles MD 11/11/2024 08:49 PM EST RP Workstation: [id number]   Scheduled Meds:  amiodarone   200 mg Oral Daily   apixaban   5 mg Oral BID   atorvastatin   40 mg Oral Daily    cholecalciferol   1,000 Units Oral Daily   cyanocobalamin   1,000 mcg Oral Daily   dapagliflozin  propanediol  10 mg Oral Daily   doxycycline   100 mg Oral Q12H   ferrous sulfate   325 mg Oral Q breakfast   furosemide   20 mg Oral Daily   ipratropium-albuterol   3 mL Nebulization TID   irbesartan   37.5 mg Oral Daily   isosorbide  mononitrate  30 mg Oral Daily   levothyroxine   75 mcg Oral QAC breakfast   lidocaine   1 patch Transdermal Q24H   montelukast   10 mg Oral QHS   pantoprazole   40 mg Oral Daily   predniSONE   50 mg Oral Q breakfast   rOPINIRole   0.25 mg Oral QHS   spironolactone   25 mg Oral Daily   sucralfate   1 g Oral TID WC & HS    LOS: 2 days   Time spent: 50 minutes  Dr. Sherre [facility] If 7PM-7AM, please contact night-coverage 11/13/2024, 4:01 PM

## 2024-11-13 NOTE — Assessment & Plan Note (Signed)
 12/13: Patient had an episode of shortness of breath and felt appropriately anxious Ativan  0.5 mg IV as needed dose was given. I discontinued Ativan  0.5 mg IV.  Ativan  0.5 mg p.o. every 6 hours as needed for anxiety, 2 days ordered.

## 2024-11-13 NOTE — Evaluation (Signed)
 Physical Therapy Evaluation Patient Details Name: Linda Brady MRN: [id number] DOB: 1941/10/30 Today's Date: 11/13/2024  History of Present Illness  Pt is a 83 y.o. female who presents with SOB, admitted with similar in 8/25.  Current MD assessment: Acute on chronic respiratory failure with hypoxia due to COPD exacerbation. 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  Clinical Impression  Pt was pleasant but anxious and initially hesitant to do a lot of PT.  She does acknowledge that getting up and moving is good for her and agreed to do some moving.  Overall she showed ability perform bed mobility and some light activity w/o excessive fatigue or need for assist. She is not at her baseline but did show ability to mobilize limited distances with relative ease.  PT will benefit from continued PT to address functional limitations.       If plan is discharge home, recommend the following: A little help with walking and/or transfers;A little help with bathing/dressing/bathroom   Can travel by private vehicle        Equipment Recommendations None recommended by PT  Recommendations for Other Services       Functional Status Assessment Patient has had a recent decline in their functional status and demonstrates the ability to make significant improvements in function in a reasonable and predictable amount of time.     Precautions / Restrictions Precautions Precautions: Fall Spinal Brace: Thoracolumbosacral orthotic;Other (comment) Restrictions Weight Bearing Restrictions Per Provider Order: No      Mobility  Bed Mobility Overal bed mobility: Modified Independent             General bed mobility comments: up to EOB w/o assist, minimal hesitation    Transfers Overall transfer level: Needs assistance Equipment used: Rolling walker (2 wheels) Transfers: Sit to/from Stand Sit to Stand:  Supervision           General transfer comment: Pt able to rise with good relative effort, minimal cuing for set up and sequencing - no physical assist needed    Ambulation/Gait Ambulation/Gait assistance: Supervision Gait Distance (Feet): 75 Feet Assistive device: Rolling walker (2 wheels)         General Gait Details: Pt was able to ambulate with relatively safety, she was slow and guarded and endorses feeling fatigued with the effort.  Pt on 2L O2 with SpO2 remaining in the high 90s.  Stairs            Wheelchair Mobility     Tilt Bed    Modified Rankin (Stroke Patients Only)       Balance Overall balance assessment: Needs assistance Sitting-balance support: No upper extremity supported, Feet supported Sitting balance-Leahy Scale: Good     Standing balance support: Bilateral upper extremity supported Standing balance-Leahy Scale: Fair Standing balance comment: no LOB using RW during ambulation                             Pertinent Vitals/Pain Pain Assessment Pain Assessment: No/denies pain Faces Pain Scale: Hurts little more Pain Location: acute on chronic back pain    Home Living Family/patient expects to be discharged to:: Private residence Living Arrangements: Alone Available Help at Discharge: Family (daughter helps with meds and does the driving, stops in PRN) Type of Home: House Home Access: Stairs to enter Entrance Stairs-Rails: Right Entrance Stairs-Number of Steps: 4   Home  Layout: One level Home Equipment: Shower seat - built [employer] (2 wheels);Grab bars - tub/shower      Prior Function Prior Level of Function : Independent/Modified Independent Pt does not normally rely on AD but does have as needed             Mobility Comments: uses RW due to chronic LBP ADLs Comments: ind with ADLs. Daughter drives her to/from appointments and does grocery shopping and meds     Extremity/Trunk Assessment   Upper  Extremity Assessment Upper Extremity Assessment: Overall WFL for tasks assessed    Lower Extremity Assessment Lower Extremity Assessment: Overall WFL for tasks assessed (generalized weakness, maintains functional strength)       Communication   Communication Communication: Impaired    Cognition Arousal: Alert Behavior During Therapy: WFL for tasks assessed/performed, Anxious (pt reports baseline anxiety)   PT - Cognitive impairments: No apparent impairments                         Following commands: Intact       Cueing Cueing Techniques: Verbal cues     General Comments General comments (skin integrity, edema, etc.): Pt anxious, did not display significant shortness of breath but was quick to fatigue with the effort    Exercises     Assessment/Plan    PT Assessment Patient needs continued PT services  PT Problem List Decreased activity tolerance;Decreased balance;Decreased strength;Decreased mobility       PT Treatment Interventions Gait training;Stair training;Functional mobility training;Therapeutic activities;Therapeutic exercise;Balance training    PT Goals (Current goals can be found in the Care Plan section)  Acute Rehab PT Goals Patient Stated Goal: to return home PT Goal Formulation: With patient Time For Goal Achievement: 11/26/24 Potential to Achieve Goals: Good    Frequency Min 2X/week     Co-evaluation               AM-PAC PT 6 Clicks Mobility  Outcome Measure Help needed turning from your back to your side while in a flat bed without using bedrails?: None Help needed moving from lying on your back to sitting on the side of a flat bed without using bedrails?: None Help needed moving to and from a bed to a chair (including a wheelchair)?: None Help needed standing up from a chair using your arms (e.g., wheelchair or bedside chair)?: None Help needed to walk in hospital room?: A Little Help needed climbing 3-5 steps with a  railing? : A Lot 6 Click Score: 21    End of Session Equipment Utilized During Treatment: Gait belt;Oxygen Activity Tolerance: Patient tolerated treatment well Patient left: with nursing/sitter in room;in chair;with call bell/phone within reach Nurse Communication: Mobility status PT Visit Diagnosis: Unsteadiness on feet (R26.81);Muscle weakness (generalized) (M62.81);Difficulty in walking, not elsewhere classified (R26.2)    Time: 9174-9151 PT Time Calculation (min) (ACUTE ONLY): 23 min   Charges:   PT Evaluation $PT Eval Low Complexity: 1 Low PT Treatments $Gait Training: 8-22 mins PT General Charges $$ ACUTE PT VISIT: 1 Visit         Carmin JONELLE Deed, DPT 11/13/2024, 10:44 AM

## 2024-11-13 NOTE — Assessment & Plan Note (Signed)
-   Apixaban 5 mg p.o. twice daily

## 2024-11-13 NOTE — Assessment & Plan Note (Signed)
 Euvolemic at this time. ?

## 2024-11-13 NOTE — Assessment & Plan Note (Signed)
 Stable

## 2024-11-14 MED ORDER — IPRATROPIUM-ALBUTEROL 0.5-2.5 (3) MG/3ML IN SOLN
3.0000 mL | Freq: Four times a day (QID) | RESPIRATORY_TRACT | Status: DC | PRN
  Administered 2024-11-15 (×2): 3 mL via RESPIRATORY_TRACT
  Filled 2024-11-14 (×2): qty 3

## 2024-11-14 MED ADMIN — Prednisone Tab 50 MG: 50 mg | ORAL | NDC 60687085411

## 2024-11-14 NOTE — Plan of Care (Signed)

## 2024-11-14 NOTE — Progress Notes (Signed)
 Mobility Specialist - Progress Note   11/14/24 1300  Mobility  Activity Respositioned in chair;Turned to right side;Placed arms in swimmer's position - left arm up;Placed arms in swimmer's position - right arm up;Stood at bedside  Level of Assistance Standby assist, set-up cues, supervision of patient - no hands on  Assistive Device None  Distance Ambulated (ft) 2 ft  Range of Motion/Exercises All extremities  Activity Response Tolerated well  Mobility visit 1 Mobility  Mobility Specialist Start Time (ACUTE ONLY) 1245  Mobility Specialist Stop Time (ACUTE ONLY) 1250  Mobility Specialist Time Calculation (min) (ACUTE ONLY) 5 min   Pt was in the recliner with chair alarm on. Pt requested repositioning in the recliner. I agreed, but pt had to STS. Pt was able to STS independently without AD. Pt repositioned in the recliner after a few steps. Needs in reach and chair alarm on.  Clem Rodes Mobility Specialist 11/14/2024, 1:07 PM

## 2024-11-14 NOTE — Plan of Care (Signed)

## 2024-11-14 NOTE — Progress Notes (Signed)
 Progress Note   Patient: Linda Brady FMW:[id number] DOB: [birth year] DOA: 11/11/2024     3 DOS: the patient was seen and examined on 11/14/2024   Brief hospital course: 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  11/11/2024: Presents ED for chest pain and shortness of breath.  Per ED documentation, EMS found the patient at 83% on her baseline 2 L nasal cannula.  Patient was placed on 4 L nasal cannula with adequate oxygenation and improvement.  Solu-Medrol  125 mg IV given.  12/11: Patient admitted to [facility]  hospitalist service for acute on chronic respiratory failure with hypoxia secondary to COPD exacerbation.  Patient noted to have wheezing and rhonchi with decreased air movement on auscultation, bronchodilators and as needed Mucinex  ordered.  Solu-Medrol  80 mg IV daily.  12/12: I assumed care of the patient.  Patient waiting in the ED for PCU bed.  Patient noted to be stable with improved oxygenation, and heart rate.  Transferred patient to telemetry.  Ativan  0.5 mg IV every 6 hours as needed for anxiety, 3 doses ordered  Solu-Medrol  80 mg IV was given today and discontinued for future doses.  Prednisone  50 mg daily starting on 12/13 ordered for 3 days. Noted daily lidocaine  patch in place and pt still endorsing tenderness. Tramadol  50 mg q6h prn for moderate, severe pain.  Pt has home PT/OT. PT/OT inpt is ordered on 12/11. DME for nebulizer ordered.  12/14: Patient still feels weak and short of breath.  Assessment and Plan:  COPD exacerbation (HCC) Patient STILL complains of some shortness of breath on ambulation Continue with DuoNebs 4 times daily Albuterol  2.5 mg inhalation every 4 hours as needed for wheezing and shortness of breath Montelukast  10 mg nightly Maintain SpO2  greater than 92%   12/12: Noted respiratory 20 pathogen panel has been ordered and pending collection at this time.  RSV/influenza A/B/COVID panel were negative.   12/13: Patient required a dose of lorazepam  0.5 mg IV at 955 due to anxiety.  Prednisone  50 mg daily.  DuoNebs 3 times daily, 4 doses ordered.  Continue albuterol  and inhalation every 4 hours as needed for wheezing and sob.  DME for nebulizer ordered.   Chronic heart failure with preserved ejection fraction (HFpEF) (HCC) Euvolemic at this time   Anxiety 12/13: Patient had an episode of shortness of breath and felt appropriately anxious Ativan  0.5 mg IV as needed dose was given. Discontinued Ativan  0.5 mg IV.  12/13 Ativan  0.5 mg p.o. every 6 hours as needed for anxiety, 2 days ordered.   Hyponatremia Persistent, 129, 12/12 Secondary to furosemide  daily Continue with regular diet   Atrial fibrillation, chronic (HCC) Apixaban  5 mg p.o. twice daily   Chronic kidney disease, stage 3a (HCC) Stable   Dyslipidemia Home atorvastatin  40 mg daily   Essential hypertension Currently controlled Home furosemide  20 mg IV daily, irbesartan  37.5 mg daily, Imdur  30 mg daily, spironolactone  25 mg daily Hydralazine  5 mg IV every 2 hours as needed for SBP greater than 165   Hypothyroidism Home levofloxacin 75 mcg daily before breakfast        Subjective: Feels weak and shortness of breath  Physical Exam: Vitals:   11/13/24 1614 11/13/24 2045 11/14/24 0447 11/14/24 0837  BP: 108/85 139/72 (!) 140/59 (!) 138/99  Pulse: 81 71 69 72  Resp: 18   17  Temp: 97.9 F (36.6 C) 97.7 F (36.5 C) (!) 97.4 F (36.3 C) 97.6 F (36.4 C)  TempSrc:  Oral Oral Oral  SpO2: 93% 99% 99% 95%  Weight:   74.9 kg   Height:       Constitutional: Alert, awake, calm, comfortable HEENT: Neck supple Respiratory: Bilateral wheezing present, occasional scattered rhonchi, no rales Cardiovascular: Regular rate and rhythm, no murmurs / rubs /  gallops. No extremity edema. 2+ pedal pulses. No carotid bruits.  Abdomen: Soft, no tenderness, Bowel sounds positive.  Musculoskeletal: no clubbing / cyanosis. Good ROM, no contractures. Normal muscle tone.  Skin: no rashes, lesions, ulcers. Neurologic: CN 2-12 grossly intact. Sensation intact, No focal deficit identified Psychiatric: Alert and oriented x 3. Normal mood.    Data Reviewed:  Reviewed  Family Communication: None available  Disposition: Status is: Inpatient Remains inpatient appropriate because: Ongoing recovery from COPD exacerbation  Planned Discharge Destination: Home    Time spent: 35 minutes  Author: Nena Rebel, MD 11/14/2024 1:44 PM  For on call review [email or web address].

## 2024-11-15 ENCOUNTER — Other Ambulatory Visit: Payer: Self-pay

## 2024-11-15 MED ORDER — DOXYCYCLINE HYCLATE 100 MG PO TABS: 100.0000 mg | ORAL_TABLET | Freq: Two times a day (BID) | ORAL | 0 refills | Status: DC

## 2024-11-15 MED ORDER — ENSURE PLUS HIGH PROTEIN PO LIQD
237.0000 mL | Freq: Two times a day (BID) | ORAL | Status: DC
  Administered 2024-11-15: 09:00:00 237 mL via ORAL

## 2024-11-15 MED ORDER — DOXYCYCLINE HYCLATE 100 MG PO TABS
100.0000 mg | ORAL_TABLET | Freq: Two times a day (BID) | ORAL | 0 refills | Status: DC
  Filled 2024-11-15: qty 5, 3d supply, fill #0

## 2024-11-15 MED ADMIN — Prednisone Tab 50 MG: 50 mg | ORAL | @ 09:00:00 | NDC 60687085411

## 2024-11-15 NOTE — TOC Initial Note (Addendum)
 Transition of Care [facility]) - Initial/Assessment Note    Patient Details  Name: Linda Brady MRN: [id number] Date of Birth: Aug 02, 1941  [facility] CM/SW Contact:    Daved JONETTA Hamilton, RN Phone Number: 11/15/2024, 1:45 PM  Clinical Narrative:                  Met with patient, introduced self and explained role. Patient verbalized she is being discharged today and has HH already set up. Patient verbalized her daughter will be picking her up today and that her daughter assists her with transportation and medication management. Patient verbalized a couple of questions about discharge, this CM passed those to the bedside nurse and provider via [place].   Spoke with patient's daughter Edna Fuss, introduced self and explained role. Joann verbalized she will be transporting her mother home from the hospital today however, she is due to be at work soon and needs to know when to come to get her. This CM discussed with bedside RN, ok for Joann to come now. This CM provided Joann with the phone number to call when she arrives out front and patient will be brought down to her. Joann verbalized patient has portable O2 in the room with her.   Joane at [facility] notified of patient's discharge today.   Readmission risk completed  TOC signing off  Expected Discharge Plan: Home w Home Health Services Barriers to Discharge: Barriers Resolved   Patient Goals and CMS Choice            Expected Discharge Plan and Services   Discharge Planning Services: CM Consult   Living arrangements for the past 2 months: Single Family Home Expected Discharge Date: 11/15/24                           [facility] Agency: [facility] Date [facility] Agency Contacted: 11/15/24 Time HH Agency Contacted: [phone] Representative spoke with at [facility] Agency: Joane  Prior Living Arrangements/Services Living arrangements for the past 2 months: Single Family Home Lives with:: Self Patient language and need for  interpreter reviewed:: Yes Do you feel safe going back to the place where you live?: Yes      Need for Family Participation in Patient Care: Yes (Comment) Care giver support system in place?: Yes (comment) Current home services: Home OT, Home PT Criminal Activity/Legal Involvement Pertinent to Current Situation/Hospitalization: No - Comment as needed  Activities of Daily Living   ADL Screening (condition at time of admission) Independently performs ADLs?: No Does the patient have a NEW difficulty with bathing/dressing/toileting/self-feeding that is expected to last >3 days?: No Does the patient have a NEW difficulty with getting in/out of bed, walking, or climbing stairs that is expected to last >3 days?: No Does the patient have a NEW difficulty with communication that is expected to last >3 days?: No Is the patient deaf or have difficulty hearing?: Yes Does the patient have difficulty seeing, even when wearing glasses/contacts?: No Does the patient have difficulty concentrating, remembering, or making decisions?: No  Permission Sought/Granted Permission sought to share information with : Case Manager, Magazine Features Editor, Family Supports Permission granted to share information with : Yes, Verbal Permission Granted  Share Information with NAME: Joann Gwynn     Permission granted to share info w Relationship: daughter  Permission granted to share info w Contact Information: [phone]  Emotional Assessment Appearance:: Appears stated age, Well-Groomed Attitude/Demeanor/Rapport: Engaged Affect (typically observed):  Appropriate Orientation: : Oriented to Self, Oriented to Place, Oriented to  Time, Oriented to Situation Alcohol / Substance Use: Not Applicable Psych Involvement: No (comment)  Admission diagnosis:  Shortness of breath [R06.02] Respiratory distress [R06.03] COPD exacerbation (HCC) [J44.1] Patient Active Problem List   Diagnosis Date Noted   Acute on  chronic respiratory failure with hypoxia (HCC) 11/11/2024   Atrial fibrillation, chronic (HCC) 11/11/2024   Depression with anxiety 11/11/2024   Chronic kidney disease, stage 3a (HCC) 11/11/2024   Iron deficiency anemia 11/11/2024   Overweight (BMI 25.0-29.9) 11/11/2024   Chronic respiratory failure with hypoxia (HCC) 11/03/2024   Myocardial injury 07/28/2024   COPD exacerbation (HCC) 07/27/2024   Sepsis (HCC) 07/27/2024   Obesity (BMI 30-39.9) 07/27/2024   Acute CHF (congestive heart failure) (HCC) 05/18/2024   CHF (congestive heart failure) (HCC) 05/18/2024   RUQ abdominal tenderness 04/23/2024   Generalized weakness 03/27/2024   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   Restless leg syndrome 10/24/2023   Paroxysmal atrial fibrillation with RVR (HCC) 09/18/2023   Afib (HCC) 09/18/2023   Chronic heart failure with preserved ejection fraction (HFpEF) (HCC) 12/16/2021   Anxiety 12/15/2021   Coronary artery disease involving native coronary artery of native heart    Aortic stenosis, severe    Hyponatremia 12/14/2021   Dyslipidemia 12/14/2021   GERD (gastroesophageal reflux disease) 12/14/2021   Vitamin B12 deficiency 12/14/2021   Chest pain 09/10/2020   Hypokalemia 09/10/2020   Hypothyroidism    COPD (chronic obstructive pulmonary disease) (HCC)    Essential hypertension    PCP:  Valora Lynwood FALCON, MD Pharmacy:   [facility] [address], [place] - [address] [place] [address] Phone: [phone] Fax: [phone]  [facility] [address] [place] [address] Phone: [phone] Fax: [phone]     Social Drivers of Health (SDOH) Social History: SDOH Screenings   Food Insecurity: No Food Insecurity (11/12/2024)  Housing: Low Risk (11/12/2024)  Transportation Needs: No Transportation Needs (11/12/2024)  Utilities:  Not At Risk (11/12/2024)  Financial Resource Strain: Low Risk  (08/25/2024)   Received from [facility]  Social Connections: Socially Isolated (11/12/2024)  Tobacco Use: Medium Risk (11/11/2024)   SDOH Interventions: Social Connections Interventions: Inpatient TOC, Other (Comment) (Resources added to AVS)   Readmission Risk Interventions    05/20/2024    4:06 PM 10/28/2023    2:54 PM  Readmission Risk Prevention Plan  Transportation Screening Complete Complete  PCP or Specialist Appt within 5-7 Days  Complete  PCP or Specialist Appt within 3-5 Days Complete   Home Care Screening  Complete  Medication Review (RN CM)  Complete  HRI or Home Care Consult Complete   Social Work Consult for Recovery Care Planning/Counseling Complete   Palliative Care Screening Not Applicable   Medication Review Oceanographer) Complete

## 2024-11-15 NOTE — Care Management Important Message (Signed)
 Important Message  Patient Details  Name: Linda Brady MRN: [id number] Date of Birth: December 28, 1940   Important Message Given:  Yes - Medicare IM     Kenidy Crossland W, CMA 11/15/2024, 10:52 AM

## 2024-11-15 NOTE — Discharge Summary (Signed)
 Physician Discharge Summary   Patient: Linda Brady MRN: [id number] DOB: 27-Oct-1941  Admit date:     11/11/2024  Discharge date: 11/15/2024  Discharge Physician: Nena Rebel   PCP: Valora Lynwood FALCON, MD   Recommendations at discharge:   Continue your home medications and doxycycline  as prescribed Follow-up with primary care provider within a week Come to the emergency room for worsening shortness of breath, fever  Discharge Diagnoses: Principal Problem:   COPD exacerbation (HCC) Active Problems:   Hyponatremia   Anxiety   Chronic heart failure with preserved ejection fraction (HFpEF) (HCC)   Atrial fibrillation, chronic (HCC)   Hypothyroidism   Essential hypertension   Dyslipidemia   Depression with anxiety   Chronic kidney disease, stage 3a (HCC)   Acute on chronic respiratory failure with hypoxia (HCC)   Iron deficiency anemia   Overweight (BMI 25.0-29.9)  Resolved Problems:   * No resolved hospital problems. [facility] Course: 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  11/11/2024: Presents ED for chest pain and shortness of breath.  Per ED documentation, EMS found the patient at 83% on her baseline 2 L nasal cannula.  Patient was placed on 4 L nasal cannula with adequate oxygenation and improvement.  Solu-Medrol  125 mg IV given.  12/11: [facility] admitted [facility] for acute on chronic respiratory failure with hypoxia secondary to COPD exacerbation.  Patient noted to have wheezing and rhonchi with decreased air movement on auscultation, bronchodilators and as needed Mucinex  ordered.  Solu-Medrol  80 mg IV daily.  12/12: I assumed care of the patient.  Patient waiting in the ED for PCU bed.  Patient noted to be stable with improved oxygenation,  and heart rate.  Transferred patient to telemetry.  Ativan  0.5 mg IV every 6 hours as needed for anxiety, 3 doses ordered  Solu-Medrol  80 mg IV was given today and discontinued for future doses.  Prednisone  50 mg daily starting on 12/13 ordered for 3 days. Noted daily lidocaine  patch in place and pt still endorsing tenderness. Tramadol  50 mg q6h prn for moderate, severe pain.  Pt has home PT/OT. PT/OT inpt is ordered on 12/11. DME for nebulizer ordered.  12/14: Patient still feels weak and short of breath. 12/15: Patient feels better and want to go home.  She will be discharged home on home medications plus doxycycline  and home care.  Assessment and Plan:  COPD exacerbation (HCC) Patient feels much better and denies any cough or shortness of breath or fever Continue with DuoNebs 4 times daily Albuterol  2.5 mg inhalation every 4 hours as needed for wheezing and shortness of breath Montelukast  10 mg nightly Maintain SpO2 greater than 92% RSV/influenza A/B/COVID panel were negative. Continue albuterol  and inhalation every 4 hours as needed for wheezing and sob.   DME for nebulizer ordered. Continue doxycycline  as prescribed   Chronic heart failure with preserved ejection fraction (HFpEF) (HCC) Euvolemic at this time Resume home dose of Lasix    Anxiety Patient was treated with as needed Ativan  while she was in the hospital. Resume home dose of Xanax   Hyponatremia Persistent, 129, 12/16 Secondary to furosemide  daily Continue with regular diet Continue to monitor with primary care provider   Atrial fibrillation, chronic (HCC) Apixaban  5 mg p.o. twice daily Amiodarone  200 mg oral daily   Chronic kidney disease, stage 3a (HCC)  Stable   Dyslipidemia Home atorvastatin  40 mg daily   Essential hypertension Currently controlled Home furosemide  40 mg PO, irbesartan  37.5 mg daily, Imdur  30 mg daily, spironolactone  25 mg daily Monitor blood pressure at home  Hypothyroidism Home  levothyroxine  75 mcg daily before breakfast  Deconditioning Patient will have home health support           Pain control - [place]  Controlled Substance Reporting System database was reviewed. and patient was instructed, not to drive, operate heavy machinery, perform activities at heights, swimming or participation in water activities or provide baby-sitting services while on Pain, Sleep and Anxiety Medications; until their outpatient Physician has advised to do so again. Also recommended to not to take more than prescribed Pain, Sleep and Anxiety Medications.  Consultants: None Procedures performed: PT/OT Disposition: Home health Diet recommendation:  Cardiac diet DISCHARGE MEDICATION: Allergies as of 11/15/2024       Reactions   Naproxen Sodium Anaphylaxis        Medication List     TAKE these medications    rOPINIRole  0.25 MG tablet Commonly known as: REQUIP  Take 1 tablet (0.25 mg total) by mouth at bedtime. The timing of this medication is very important.   acetaminophen  500 MG tablet Commonly known as: TYLENOL  Take 1 tablet (500 mg total) by mouth every 6 (six) hours as needed for mild pain (pain score 1-3), fever or moderate pain (pain score 4-6) (or Fever >/= 101).   ALPRAZolam  0.25 MG tablet Commonly known as: XANAX  Take 0.25 mg by mouth at bedtime as needed for sleep.   amiodarone  200 MG tablet Commonly known as: PACERONE  Take 1 tablet (200 mg total) by mouth daily.   atorvastatin  40 MG tablet Commonly known as: LIPITOR Take 1 tablet (40 mg total) by mouth daily.   cholecalciferol  25 MCG (1000 UNIT) tablet Commonly known as: VITAMIN D3 Take 1,000 Units by mouth daily.   Combivent  Respimat 20-100 MCG/ACT Aers respimat Generic drug: Ipratropium-Albuterol  Inhale 1 puff into the lungs every 6 (six) hours as needed for wheezing.   cyanocobalamin  1000 MCG tablet Commonly known as: VITAMIN B12 Take 1,000 mcg by mouth daily.   dapagliflozin   propanediol 10 MG Tabs tablet Commonly known as: Farxiga  Take 1 tablet (10 mg total) by mouth daily before breakfast.   doxycycline  100 MG tablet Commonly known as: VIBRA -TABS Take 1 tablet (100 mg total) by mouth every 12 (twelve) hours.   Eliquis  5 MG Tabs tablet Generic drug: apixaban  Take 5 mg by mouth 2 (two) times daily.   ferrous sulfate  325 (65 FE) MG tablet Take 1 tablet (325 mg total) by mouth daily with breakfast.   fluticasone  50 MCG/ACT nasal spray Commonly known as: FLONASE Place 2 sprays into both nostrils daily.   furosemide  40 MG tablet Commonly known as: LASIX  Take 40 mg by mouth daily. What changed: Another medication with the same name was removed. Continue taking this medication, and follow the directions you see here.   isosorbide  mononitrate 30 MG 24 hr tablet Commonly known as: IMDUR  Take 30 mg by mouth daily.   levalbuterol  1.25 MG/3ML nebulizer solution Commonly known as: XOPENEX  Take 1.25 mg by nebulization every 6 (six) hours as needed.   levothyroxine  75 MCG tablet Commonly known as: SYNTHROID  Take 75 mcg by mouth daily before breakfast.   lidocaine  5 % Commonly known as: LIDODERM  Place 1 patch onto the skin daily.   mometasone -formoterol  200-5 MCG/ACT Aero Commonly known as: DULERA  Inhale 2 puffs into the  lungs 2 (two) times daily.   montelukast  10 MG tablet Commonly known as: SINGULAIR  Take 1 tablet by mouth at bedtime.   pantoprazole  40 MG tablet Commonly known as: PROTONIX  Take 40 mg by mouth daily.   potassium chloride  SA 20 MEQ tablet Commonly known as: KLOR-CON  M Take 2 tablets (40 mEq total) by mouth daily.   spironolactone  25 MG tablet Commonly known as: ALDACTONE  Take 1 tablet (25 mg total) by mouth daily.   sucralfate  1 g tablet Commonly known as: CARAFATE  Take 1 g by mouth 4 (four) times daily -  with meals and at bedtime.   valsartan  40 MG tablet Commonly known as: DIOVAN  Take 0.5 tablets (20 mg total) by  mouth daily.               Durable Medical Equipment  (From admission, onward)           Start     Ordered   11/13/24 1601  For home use only DME Nebulizer machine  Once       Question Answer Comment  Patient needs a nebulizer to treat with the following condition Shortness of breath   Patient needs a nebulizer to treat with the following condition Bronchitis   Patient needs a nebulizer to treat with the following condition COPD exacerbation (HCC)   Length of Need Lifetime   Additional equipment included Administration kit   Additional equipment included Filter      11/13/24 1600            Follow-up Information     Valora Lynwood FALCON, MD Follow up.   Specialty: Family Medicine Why: Hospital follow up Contact information: [address] [place] [address] [phone]                Discharge Exam: Fredricka Weights   11/13/24 0500 11/14/24 0447 11/15/24 0524  Weight: 72.3 kg 74.9 kg 75.3 kg   Constitutional: Alert, awake, calm, comfortable HEENT: Neck supple Respiratory: Scattered bilateral wheezes and occasional coarse crepitations on both lung field Cardiovascular: Regular rate and rhythm, no murmurs / rubs / gallops. No extremity edema. 2+ pedal pulses. No carotid bruits.  Abdomen: Soft, no tenderness, Bowel sounds positive.  Musculoskeletal: no clubbing / cyanosis. Good ROM, no contractures. Normal muscle tone.  Skin: no rashes, lesions, ulcers. Neurologic: CN 2-12 grossly intact. Sensation intact, No focal deficit identified Psychiatric: Alert and oriented x 3. Normal mood.     Condition at discharge: good  The results of significant diagnostics from this hospitalization (including imaging, microbiology, ancillary and laboratory) are listed below for reference.   Imaging Studies: DG Chest 1 View Result Date: 11/11/2024 EXAM: 1 VIEW(S) XRAY OF THE CHEST 11/11/2024 08:47:00 PM COMPARISON: 11/01/2024 CLINICAL HISTORY:  Shortness of breath FINDINGS: LINES, TUBES AND DEVICES: Right upper extremity vascular stent noted. Transcatheter aortic valve replacement noted. LUNGS AND PLEURA: No focal pulmonary opacity. No pleural effusion. No pneumothorax. HEART AND MEDIASTINUM: Aortic atherosclerosis. Transcatheter aortic valve replacement noted. BONES AND SOFT TISSUES: Small hiatal hernia. No acute osseous abnormality. IMPRESSION: 1. No acute cardiopulmonary abnormality. Electronically signed by: Pinkie Pebbles MD 11/11/2024 08:49 PM EST RP Workstation: [id number]   CT Angio Chest PE W and/or Wo Contrast Result Date: 11/01/2024 EXAM: CTA CHEST 11/01/2024 06:58:18 PM TECHNIQUE: CTA of the chest was performed with the administration of 75 mL of iohexol  (OMNIPAQUE ) 350 MG/ML injection. Multiplanar reformatted images are provided for review. MIP images are provided for review. Automated exposure control,  iterative reconstruction, and/or weight based adjustment of the mA/kV was utilized to reduce the radiation dose to as low as reasonably achievable. COMPARISON: Same day x-ray and CT chest 23/25. CLINICAL HISTORY: Pulmonary embolism (PE) suspected, low to intermediate prob, positive D-dimer. Chest pain and shortness of breath. FINDINGS: PULMONARY ARTERIES: Pulmonary arteries are adequately opacified for evaluation. No acute pulmonary embolus. Main pulmonary artery is normal in caliber. MEDIASTINUM: The heart and pericardium demonstrate no acute abnormality. Coronary artery and aortic atherosclerotic calcifications. There is no acute abnormality of the thoracic aorta. LYMPH NODES: No mediastinal, hilar or axillary lymphadenopathy. LUNGS AND PLEURA: Emphysema. Mild bronchial wall thickening and mucus plugging in the lower lobes. Scarring / atelectasis in the lower lungs. No focal consolidation or pulmonary edema. No evidence of pleural effusion or pneumothorax. UPPER ABDOMEN: Large hiatal hernia. Limited images of the upper abdomen are  otherwise unremarkable. SOFT TISSUES AND BONES: Superior endplate compression fracture of L1, new since MRI 07/10/2024. There is approximately 25% vertebral body height loss and 4 mm of retropulsion of the superior endplate. No acute soft tissue abnormality. IMPRESSION: 1. No pulmonary embolism. 2. New superior endplate compression fracture of L1 with approximately 25% vertebral body height loss and 4 mm of retropulsion of the superior endplate. 3. Pulmonary emphysema and chronic bronchitis. Electronically signed by: Norman Gatlin MD 11/01/2024 07:54 PM EST RP Workstation: [id number]   DG Chest 2 View Result Date: 11/01/2024 CLINICAL DATA:  Shortness of breath and chest pain. EXAM: CHEST - 2 VIEW COMPARISON:  07/27/2024 and CT chest 04/23/2024. FINDINGS: Trachea is midline. Heart is enlarged, stable. Aortic valve replacement. No airspace consolidation or pleural fluid. Minimal streaky scarring in the lung bases. Degenerative changes in the spine. IMPRESSION: No acute findings. Electronically Signed   By: Newell Eke M.D.   On: 11/01/2024 16:02    Microbiology: Results for orders placed or performed during the hospital encounter of 11/11/24  Resp panel by RT-PCR (RSV, Flu A&B, Covid) Anterior Nasal Swab     Status: None   Collection Time: 11/11/24  8:26 PM   Specimen: Anterior Nasal Swab  Result Value Ref Range Status   SARS Coronavirus 2 by RT PCR NEGATIVE NEGATIVE Final    Comment: (NOTE) SARS-CoV-2 target nucleic acids are NOT DETECTED.  The SARS-CoV-2 RNA is generally detectable in upper respiratory specimens during the acute phase of infection. The lowest concentration of SARS-CoV-2 viral copies this assay can detect is 138 copies/mL. A negative result does not preclude SARS-Cov-2 infection and should not be used as the sole basis for treatment or other patient management decisions. A negative result may occur with  improper specimen collection/handling, submission of specimen  other than nasopharyngeal swab, presence of viral mutation(s) within the areas targeted by this assay, and inadequate number of viral copies(<138 copies/mL). A negative result must be combined with clinical observations, patient history, and epidemiological information. The expected result is Negative.  Fact Sheet for Patients:  bloggercourse.com  Fact Sheet for Healthcare Providers:  seriousbroker.it  This test is no t yet approved or cleared by the [place]  FDA and  has been authorized for detection and/or diagnosis of SARS-CoV-2 by FDA under an Emergency Use Authorization (EUA). This EUA will remain  in effect (meaning this test can be used) for the duration of the COVID-19 declaration under Section 564(b)(1) of the Act, 21 U.S.C.section 360bbb-3(b)(1), unless the authorization is terminated  or revoked sooner.       Influenza A by PCR NEGATIVE NEGATIVE Final  Influenza B by PCR NEGATIVE NEGATIVE Final    Comment: (NOTE) The Xpert Xpress SARS-CoV-2/FLU/RSV plus assay is intended as an aid in the diagnosis of influenza from Nasopharyngeal swab specimens and should not be used as a sole basis for treatment. Nasal washings and aspirates are unacceptable for Xpert Xpress SARS-CoV-2/FLU/RSV testing.  Fact Sheet for Patients: bloggercourse.com  Fact Sheet for Healthcare Providers: seriousbroker.it  This test is not yet approved or cleared by the [place]  FDA and has been authorized for detection and/or diagnosis of SARS-CoV-2 by FDA under an Emergency Use Authorization (EUA). This EUA will remain in effect (meaning this test can be used) for the duration of the COVID-19 declaration under Section 564(b)(1) of the Act, 21 U.S.C. section 360bbb-3(b)(1), unless the authorization is terminated or revoked.     Resp Syncytial Virus by PCR NEGATIVE NEGATIVE Final     Comment: (NOTE) Fact Sheet for Patients: bloggercourse.com  Fact Sheet for Healthcare Providers: seriousbroker.it  This test is not yet approved or cleared by the [place]  FDA and has been authorized for detection and/or diagnosis of SARS-CoV-2 by FDA under an Emergency Use Authorization (EUA). This EUA will remain in effect (meaning this test can be used) for the duration of the COVID-19 declaration under Section 564(b)(1) of the Act, 21 U.S.C. section 360bbb-3(b)(1), unless the authorization is terminated or revoked.  Performed at [facility], [address] [place], [place] [address]   Respiratory (~20 pathogens) panel by PCR     Status: None   Collection Time: 11/11/24 10:41 PM   Specimen: Nasopharyngeal Swab; Respiratory  Result Value Ref Range Status   Adenovirus NOT DETECTED NOT DETECTED Final   Coronavirus 229E NOT DETECTED NOT DETECTED Final    Comment: (NOTE) The Coronavirus on the Respiratory Panel, DOES NOT test for the novel  Coronavirus (2019 nCoV)    Coronavirus HKU1 NOT DETECTED NOT DETECTED Final   Coronavirus NL63 NOT DETECTED NOT DETECTED Final   Coronavirus OC43 NOT DETECTED NOT DETECTED Final   Metapneumovirus NOT DETECTED NOT DETECTED Final   Rhinovirus / Enterovirus NOT DETECTED NOT DETECTED Final   Influenza A NOT DETECTED NOT DETECTED Final   Influenza B NOT DETECTED NOT DETECTED Final   Parainfluenza Virus 1 NOT DETECTED NOT DETECTED Final   Parainfluenza Virus 2 NOT DETECTED NOT DETECTED Final   Parainfluenza Virus 3 NOT DETECTED NOT DETECTED Final   Parainfluenza Virus 4 NOT DETECTED NOT DETECTED Final   Respiratory Syncytial Virus NOT DETECTED NOT DETECTED Final   Bordetella pertussis NOT DETECTED NOT DETECTED Final   Bordetella Parapertussis NOT DETECTED NOT DETECTED Final   Chlamydophila pneumoniae NOT DETECTED NOT DETECTED Final   Mycoplasma pneumoniae NOT DETECTED NOT DETECTED  Final    Comment: Performed at [facility], [address] [place], [place] [address]    Labs: CBC: Recent Labs  Lab 11/11/24 2026 11/12/24 0335  WBC 6.5 7.0  HGB 10.5* 10.5*  HCT 34.5* 33.9*  MCV 79.9* 78.7*  PLT 359 350   Basic Metabolic Panel: Recent Labs  Lab 11/11/24 2026 11/12/24 0335  NA 130* 129*  K 3.9 4.1  CL 93* 93*  CO2 23 23  GLUCOSE 101* 170*  BUN 9 9  CREATININE 1.09* 0.98  CALCIUM  10.1 9.8   Liver Function Tests: No results for input(s): AST, ALT, ALKPHOS, BILITOT, PROT, ALBUMIN in the last 168 hours. CBG: No results for input(s): GLUCAP in the last 168 hours.  Discharge time spent: greater than  30 minutes.  Signed: Nena Rebel, MD [facility] 11/15/2024

## 2024-11-15 NOTE — Progress Notes (Signed)
 PT Cancellation Note  Patient Details Name: Linda Brady MRN: [id number] DOB: July 04, 1941   Cancelled Treatment:    Reason Eval/Treat Not Completed: Other (comment) (patient states she is leaving before 2 pm) Patient eating lunch and declines PT, states she is going home today.    Titania Gault 11/15/2024, 1:19 PM

## 2024-11-15 NOTE — TOC Progression Note (Addendum)
 Transition of Care [facility]) - Progression Note    Patient Details  Name: Linda Brady MRN: [id number] Date of Birth: 1941-05-12  [facility] of [facility] CM/SW Contact  Daved JONETTA Hamilton, RN Phone Number: 11/15/2024, 9:52 AM  Clinical Narrative:     This CM contacted Mitch with [employer] for home nebulizer.   UPDATE:  Notified by Thomasina with [employer] that patient received a home nebulizer earlier this year and insurance will not cover another one. Provider and nurse notified.                      Expected Discharge Plan and Services                                               Social Drivers of Health (SDOH) Interventions SDOH Screenings   Food Insecurity: No Food Insecurity (11/12/2024)  Housing: Low Risk (11/12/2024)  Transportation Needs: No Transportation Needs (11/12/2024)  Utilities: Not At Risk (11/12/2024)  Financial Resource Strain: Low Risk  (08/25/2024)   Received from [facility]  Social Connections: Socially Isolated (11/12/2024)  Tobacco Use: Medium Risk (11/11/2024)    Readmission Risk Interventions    05/20/2024    4:06 PM 10/28/2023    2:54 PM  Readmission Risk Prevention Plan  Transportation Screening Complete Complete  PCP or Specialist Appt within 5-7 Days  Complete  PCP or Specialist Appt within 3-5 Days Complete   Home Care Screening  Complete  Medication Review (RN CM)  Complete  HRI or Home Care Consult Complete   Social Work Consult for Recovery Care Planning/Counseling Complete   Palliative Care Screening Not Applicable   Medication Review Oceanographer) Complete

## 2024-11-15 NOTE — Discharge Instructions (Signed)

## 2024-11-15 NOTE — Plan of Care (Signed)

## 2024-11-29 ENCOUNTER — Other Ambulatory Visit: Payer: Self-pay | Admitting: Student in an Organized Health Care Education/Training Program

## 2024-12-06 ENCOUNTER — Other Ambulatory Visit: Payer: Self-pay | Admitting: Student in an Organized Health Care Education/Training Program

## 2024-12-13 ENCOUNTER — Ambulatory Visit (HOSPITAL_COMMUNITY)
Admission: RE | Admit: 2024-12-13 | Discharge: 2024-12-13 | Disposition: A | Discharge status: Home / Self Care | Source: Ambulatory Visit | Attending: Internal Medicine | Admitting: Internal Medicine

## 2024-12-13 ENCOUNTER — Ambulatory Visit: Admitting: Physician Assistant

## 2024-12-13 VITALS — BP 146/78 | HR 66 | Ht 63.0 in | Wt 160.4 lb

## 2024-12-13 DIAGNOSIS — I10 Essential (primary) hypertension: Secondary | ICD-10-CM

## 2024-12-13 DIAGNOSIS — I5032 Chronic diastolic (congestive) heart failure: Secondary | ICD-10-CM

## 2024-12-13 DIAGNOSIS — I480 Paroxysmal atrial fibrillation: Secondary | ICD-10-CM | POA: Diagnosis present

## 2024-12-13 DIAGNOSIS — Z952 Presence of prosthetic heart valve: Secondary | ICD-10-CM

## 2024-12-13 DIAGNOSIS — J449 Chronic obstructive pulmonary disease, unspecified: Secondary | ICD-10-CM

## 2024-12-13 LAB — ECHOCARDIOGRAM COMPLETE
AR max vel: 2.95 cm2
AV Area VTI: 3.15 cm2
AV Area mean vel: 2.8 cm2
AV Mean grad: 13 mmHg
AV Peak grad: 23.1 mmHg
Ao pk vel: 2.4 m/s
Area-P 1/2: 2.69 cm2
MV M vel: 5.67 m/s
MV Peak grad: 128.7 mmHg
MV VTI: 2.99 cm2
P 1/2 time: 368 ms
S' Lateral: 2.6 cm

## 2024-12-13 NOTE — Progress Notes (Unsigned)
 " [facility]   MULTIDISCIPLINARY HEART VALVE CLINIC                                     Cardiology Office Note:    Date:  12/14/2024   ID:  SHARMAIN LASTRA, DOB 08/03/1941, MRN [id number]  PCP:  Valora Lynwood FALCON, MD  [facility] Cardiologist:  Redell Cave, MD  [facility] Structural heart: Lurena MARLA Red, MD [facility] Electrophysiologist:  None   Referring MD: Valora Lynwood FALCON, MD   1 year s/p TAVR  History of Present Illness:    Linda Brady is a 84 y.o. 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   Ms. Sze has been followed by Dr. Cave as an outpatient. She was admitted 09/2023 with chest pain and palpitations found to have new onset atrial fibrillation treated with IV Cardizem  with conversion to NSR. She was started on Eliquis  at that time. Echocardiogram during that admission showed LVEF of 65 to 70%, G2DD, mild MR, mild AI, and severe aortic stenosis with a mean gradient of 49.2 mmHg.  She was seen in follow up and was doing well from an AF standpoint and was referred to the structural heart team for consideration of TAVR. [facility] 10/23/23 showed mild nonobstructive coronary artery disease. 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 She was seen by [facility] and underwent endovascular repair of right brachial artery with Dr. Magda. Post op she was started on DAPT and Eliquis  held. After 28 days she was transitioned back to Eliquis . She was set up for TAVR on 12/02/23 but this was cancelled due to hypokalemia. This was corrected and she underwent successful TAVR with a 23 mm Edwards Sapien 3 Ultra Resilia THV via the TF approach on  12/16/23. Post operative echo showed EF >75%, mod LVH, normally functioning TAVR with a mean gradient of 23 mmHg and trivial PVL, gradients felt to be elevated 2/2 high flow state. Echo 01/16/24 showed EF 60%, normally functioning TAVR with a mean gradient of 14 mm hg and mild to mod PVL (seen in 5 chamber views).    Admitted twice in December 2025 for AE COPD.    Today the patient presents to clinic for follow up. Here with her daughter, Elijah. She is doing well. Occasionally gets non exertional chest pain that gets better after a Coke. Has chronic SOB that is stable. Wearing home 02. Chronically sleeps on 2 pillows due to orthopnea. No PND. Occasional LE edema but none today. Occasional dizziness but no syncope.    Past Medical History:  Diagnosis Date   A-fib [facility])    CHF (congestive heart failure) (HCC)    COPD (chronic obstructive pulmonary disease) (HCC)    GERD (gastroesophageal reflux disease)    Hypertension    Hypothyroidism    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   s/p TAVR with a 23 mm Edwards S3UR via TF approach by Dr. Red and Dr. Maryjane   Thyroid  disease      Current Medications: Active Medications[1]    ROS:   Please see the history of present  illness.    All other systems reviewed and are negative.  EKGs       Risk Assessment/Calculations:    CHA2DS2-VASc Score = 6   This indicates a 9.7% annual risk of stroke. The patient's score is based upon: CHF History: 1 HTN History: 1 Diabetes History: 0 Stroke History: 0 Vascular Disease History: 1 Age Score: 2 Gender Score: 1          Physical Exam:    VS:  BP (!) 146/78   Pulse 66   Ht 5' 3 (1.6 m)   Wt 160 lb 6.4 oz (72.8 kg)   SpO2 98%   BMI 28.41 kg/m     Wt Readings from Last 3 Encounters:  12/13/24 160 lb 6.4 oz (72.8 kg)  11/15/24 166 lb 0.1 oz (75.3 kg)  11/01/24 159 lb 9.8 oz (72.4 kg)     GEN: Well nourished, well developed in no acute distress NECK:  No JVD CARDIAC: RRR, 2/6 SEM @ RUSB. No rubs, gallops RESPIRATORY:  Clear to auscultation without rales, wheezing or rhonchi  ABDOMEN: Soft, non-tender, non-distended EXTREMITIES:  No edema; No deformity.   ASSESSMENT:    1. S/P TAVR (transcatheter aortic valve replacement)   2. PAF (paroxysmal atrial fibrillation) (HCC)   3. Chronic heart failure with preserved ejection fraction (HFpEF) (HCC)   4. Essential hypertension   5. Chronic obstructive pulmonary disease, unspecified COPD type (HCC)     PLAN:    In order of problems listed above:  Severe AS s/p TAVR:  -- Echo today shows EF 60%, mod MAC, normally functioning TAVR with a mean gradient of 13 mm hg and mod PVL with a PHT of 368 ms.  -- NYHA class II symptoms- multifactorial with COPD.  -- Continue Eliquis  5mg  BID.   -- SBE prophylaxis when indicated.  -- Continue regular follow up with Dr. Darliss.   PAF:  -- Sounds regular on exam today.  -- Continue home Eliquis  5mg  BID  -- Continue amiodarone  200mg  daily.    HFpEF:  -- Appears euvolemic.  -- Continue lasix  40mg  daily and KCL 40meq daily.  -- Continue Farixga 10mg  daily.  -- Continue spiro 25mg  daily.   HTN:  -- BP mildly elevated today. No changes made.  -- Continue Imdur  30mg  daily, Lasix  40mg  daily, valsartan  20mg  daily and spiro 25mg  daily.   COPD:  -- Recently admitted twice in December for acute exacerbations.  -- Continue home 02.    Medication Adjustments/Labs and Tests Ordered: Current medicines are reviewed at length with the patient today.  Concerns regarding medicines are outlined above.  No orders of the defined types were placed in this encounter.  No orders of the defined types were placed in this encounter.   Patient Instructions  Medication Instructions:   Your provider recommends that you continue on your current medications as directed. Please refer to the Current Medication list given to you today.  *If you need a refill on  your cardiac medications before your next appointment, please call your pharmacy*  Lab Work: none If you have labs (blood work) drawn today and your tests are completely normal, you will receive your results only by: MyChart Message (if you have MyChart) OR A paper copy in the mail If you have any lab test that is abnormal or we need to change your treatment, we will call you to review the results.  Testing/Procedures: none  Follow-Up: At [facility], you and your health needs are our  priority.  As part of our continuing mission to provide you with exceptional heart care, our providers are all part of one team.  This team includes your primary Cardiologist (physician) and Advanced Practice Providers or APPs (Physician Assistants and Nurse Practitioners) who all work together to provide you with the care you need, when you need it.  Your next appointment:    Please keep your follow up as scheduled.     Signed, Lamarr Hummer, PA-C  12/14/2024 7:23 AM    [facility]     [1]  Current Meds  Medication Sig   acetaminophen  (TYLENOL ) 500 MG tablet Take 1 tablet (500 mg total) by mouth every 6 (six) hours as needed for mild pain (pain score 1-3), fever or moderate pain (pain score 4-6) (or Fever >/= 101).   ALPRAZolam  (XANAX ) 0.25 MG tablet Take 0.25 mg by mouth at bedtime as needed for sleep.   amiodarone  (PACERONE ) 200 MG tablet Take 1 tablet (200 mg total) by mouth daily.   apixaban  (ELIQUIS ) 5 MG TABS tablet Take 5 mg by mouth 2 (two) times daily.   atorvastatin  (LIPITOR) 40 MG tablet Take 1 tablet (40 mg total) by mouth daily.   cholecalciferol  (VITAMIN D3) 25 MCG (1000 UNIT) tablet Take 1,000 Units by mouth daily.   dapagliflozin  propanediol (FARXIGA ) 10 MG TABS tablet Take 1 tablet (10 mg total) by mouth daily before breakfast.   ferrous sulfate  325 (65 FE) MG tablet Take 1 tablet (325 mg total) by mouth daily with breakfast.   furosemide   (LASIX ) 40 MG tablet Take 40 mg by mouth daily.   Ipratropium-Albuterol  (COMBIVENT  RESPIMAT) 20-100 MCG/ACT AERS respimat Inhale 1 puff into the lungs every 6 (six) hours as needed for wheezing.   isosorbide  mononitrate (IMDUR ) 30 MG 24 hr tablet Take 30 mg by mouth daily.   levothyroxine  (SYNTHROID ) 75 MCG tablet Take 75 mcg by mouth daily before breakfast.   lidocaine  (LIDODERM ) 5 % Place 1 patch onto the skin daily.   mometasone -formoterol  (DULERA ) 200-5 MCG/ACT AERO Inhale 2 puffs into the lungs 2 (two) times daily.   montelukast  (SINGULAIR ) 10 MG tablet Take 1 tablet by mouth at bedtime.   pantoprazole  (PROTONIX ) 40 MG tablet Take 40 mg by mouth daily.   potassium chloride  SA (KLOR-CON  M) 20 MEQ tablet Take 2 tablets (40 mEq total) by mouth daily.   rOPINIRole  (REQUIP ) 0.25 MG tablet Take 1 tablet (0.25 mg total) by mouth at bedtime.   spironolactone  (ALDACTONE ) 25 MG tablet Take 1 tablet (25 mg total) by mouth daily.   sucralfate  (CARAFATE ) 1 g tablet Take 1 g by mouth 4 (four) times daily -  with meals and at bedtime.   valsartan  (DIOVAN ) 40 MG tablet Take 0.5 tablets (20 mg total) by mouth daily.   vitamin B-12 (CYANOCOBALAMIN ) 1000 MCG tablet Take 1,000 mcg by mouth daily.   "

## 2024-12-13 NOTE — Patient Instructions (Signed)
 Medication Instructions:   Your provider recommends that you continue on your current medications as directed. Please refer to the Current Medication list given to you today.  *If you need a refill on your cardiac medications before your next appointment, please call your pharmacy*  Lab Work: none If you have labs (blood work) drawn today and your tests are completely normal, you will receive your results only by: MyChart Message (if you have MyChart) OR A paper copy in the mail If you have any lab test that is abnormal or we need to change your treatment, we will call you to review the results.  Testing/Procedures: none  Follow-Up: At [facility], you and your health needs are our priority.  As part of our continuing mission to provide you with exceptional heart care, our providers are all part of one team.  This team includes your primary Cardiologist (physician) and Advanced Practice Providers or APPs (Physician Assistants and Nurse Practitioners) who all work together to provide you with the care you need, when you need it.  Your next appointment:    Please keep your follow up as scheduled.

## 2024-12-14 ENCOUNTER — Ambulatory Visit: Payer: Self-pay | Admitting: Physician Assistant

## 2024-12-20 ENCOUNTER — Ambulatory Visit: Admitting: Medical

## 2024-12-21 ENCOUNTER — Telehealth: Payer: Self-pay | Admitting: Family

## 2024-12-21 NOTE — Telephone Encounter (Signed)
 Called to confirm/remind patient of their appointment at the Advanced [facility] Failure Clinic on 12/22/24.   Appointment:   [x] Confirmed  [] Left mess   [] No answer/No voice mail  [] VM Full/unable to leave message  [] Phone not in service  Patient reminded to bring all medications and/or complete list.  Confirmed patient has transportation. Gave directions, instructed to utilize valet parking.

## 2024-12-22 ENCOUNTER — Encounter: Payer: Self-pay | Admitting: Family

## 2024-12-22 ENCOUNTER — Ambulatory Visit: Admitting: Family

## 2024-12-22 VITALS — BP 136/57 | HR 70 | Wt 160.8 lb

## 2024-12-22 DIAGNOSIS — I5032 Chronic diastolic (congestive) heart failure: Secondary | ICD-10-CM | POA: Insufficient documentation

## 2024-12-22 DIAGNOSIS — E039 Hypothyroidism, unspecified: Secondary | ICD-10-CM | POA: Diagnosis not present

## 2024-12-22 DIAGNOSIS — Z95820 Peripheral vascular angioplasty status with implants and grafts: Secondary | ICD-10-CM | POA: Insufficient documentation

## 2024-12-22 DIAGNOSIS — J449 Chronic obstructive pulmonary disease, unspecified: Secondary | ICD-10-CM | POA: Insufficient documentation

## 2024-12-22 DIAGNOSIS — I428 Other cardiomyopathies: Secondary | ICD-10-CM | POA: Insufficient documentation

## 2024-12-22 DIAGNOSIS — E785 Hyperlipidemia, unspecified: Secondary | ICD-10-CM | POA: Insufficient documentation

## 2024-12-22 DIAGNOSIS — Z79899 Other long term (current) drug therapy: Secondary | ICD-10-CM | POA: Insufficient documentation

## 2024-12-22 DIAGNOSIS — Z79890 Hormone replacement therapy (postmenopausal): Secondary | ICD-10-CM | POA: Diagnosis not present

## 2024-12-22 DIAGNOSIS — I110 Hypertensive heart disease with heart failure: Secondary | ICD-10-CM | POA: Insufficient documentation

## 2024-12-22 DIAGNOSIS — I480 Paroxysmal atrial fibrillation: Secondary | ICD-10-CM | POA: Diagnosis not present

## 2024-12-22 DIAGNOSIS — Z9981 Dependence on supplemental oxygen: Secondary | ICD-10-CM | POA: Insufficient documentation

## 2024-12-22 DIAGNOSIS — R0602 Shortness of breath: Secondary | ICD-10-CM | POA: Diagnosis present

## 2024-12-22 DIAGNOSIS — Z952 Presence of prosthetic heart valve: Secondary | ICD-10-CM | POA: Diagnosis not present

## 2024-12-22 DIAGNOSIS — J9611 Chronic respiratory failure with hypoxia: Secondary | ICD-10-CM | POA: Insufficient documentation

## 2024-12-22 DIAGNOSIS — F419 Anxiety disorder, unspecified: Secondary | ICD-10-CM | POA: Insufficient documentation

## 2024-12-22 DIAGNOSIS — E782 Mixed hyperlipidemia: Secondary | ICD-10-CM

## 2024-12-22 DIAGNOSIS — I2510 Atherosclerotic heart disease of native coronary artery without angina pectoris: Secondary | ICD-10-CM | POA: Insufficient documentation

## 2024-12-22 DIAGNOSIS — I350 Nonrheumatic aortic (valve) stenosis: Secondary | ICD-10-CM | POA: Diagnosis not present

## 2024-12-22 DIAGNOSIS — Z87891 Personal history of nicotine dependence: Secondary | ICD-10-CM | POA: Diagnosis not present

## 2024-12-22 DIAGNOSIS — Z7901 Long term (current) use of anticoagulants: Secondary | ICD-10-CM | POA: Insufficient documentation

## 2024-12-22 DIAGNOSIS — D649 Anemia, unspecified: Secondary | ICD-10-CM | POA: Insufficient documentation

## 2024-12-22 DIAGNOSIS — Z7984 Long term (current) use of oral hypoglycemic drugs: Secondary | ICD-10-CM | POA: Diagnosis not present

## 2024-12-22 DIAGNOSIS — I10 Essential (primary) hypertension: Secondary | ICD-10-CM

## 2024-12-22 NOTE — Progress Notes (Signed)
 "  Advanced Heart Failure Clinic Note   Referring Physician: admission PCP: Valora Lynwood FALCON, MD  Cardiologist: Redell Cave, MD   Chief Complaint: shortness of breath   HPI:  Ms Ahlgrim is a 84 y.o. 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   Pertinent cardiac history: LHC in 09/2020 noted mild nonobstructive CAD. Echo 09/2020 noted LVEF 55-60%. Echo 12/2021 noted LVEF of 65-70% with grade II diastolic dysfunction and moderate AS. Low risk stress test 10/2022. Echo 09/2023 noted LVEF 65-70%, grade II diastolic dysfunction, mild MR, and severe aortic stenosis. LHC 10/2023 noted mild nonobstructive CAD with preserved cardiac output and PAPI. Underwent TAVR 12/2023. Most recent echo 01/2024 noted LVEF of 60-65%, grade II diastolic dysfunction, mild MR, and mild to moderate AR though to be paravalvular.  Admitted 04/23/24 with sharp anterior chest pain on and off present at rest without radiation exacerbating or relieving factor. This was associated with nausea and some sweating/diaphoresis per patient. Patient felt flushed. Patient also reports associated sensation of panic/anxiety with associated sensation of shortness of breath present at rest. Patient denies any new cough or expectoration although she has had a mild cough last several days. s/p CT chest abdomen pelvis, troponin testing which are not remarkable. Found to have sodium of 120. Diuretics held and IVF given with improvement of sodium to 132.   Admitted 05/18/24 due to worsening shortness of breath and pedal edema. On admission, BNP was 140.1, HS-troponin was 14, ferritin is 5, TSAT is 5. Chest x-ray noted cardiomegaly with central congestion. IV diuresed. Hemoglobin stable. Potassium replenished.   Seen in [facility]  06/25 where farxgia 10mg  daily was started.   ED visit 07/27/24 for COPD exacerbation and was diagnosed with pneumonia and blood infection. WBC count 16, lactic acid 1.1, and Troponin elevated from 40 to 47. CXR showed cardiomegaly with interstitial scarring.  EKG showed sinus rhythm with anteroseptal infarction and QTc at 457. Received steroids, nebulizer,and IV antibiotics. Pt discharge home on oral antibiotics. Prednisone  was recommended to be tapered.   Seen in [facility] 09/21/24 where valsartan  20mg  daily was started.   Admitted 11/01/24 with productive cough and SOB X 1 week. Chest CT negative for PE/ pneumonia/ pulmonary edema. Continued on treatment for COPD exacerbation with steroids, antibiotics etc.   Admitted 11/11/24 with SOB and chest pain. EMS found the patient with an oxygen sat of 83% on 2L . Oxygen was increased to 4L. Solu-medrol  IV given. Admitted for COPD exacerbation. PT/ OT evaluation done.  Saw cardiology 12/13/24, no med changes made. Echo same day showed EF 60-65%, G1DD, normal RV, normally functioning TAVR with a mean gradient of 13 mm hg and mod PVL with a PHT of 368 ms.   She presents today, with her daughter, for a HF follow-up visit with a chief complaint of minimal shortness of breath. Occasional pedal edema. Chronic intermittent trouble sleeping. Denies chest pain, palpitations. Wearing oxygen at 2L around the clock although turned it off while in clinic so that she could hear me.   ROS: All systems negative except what is listed in HPI, PMH and Problem List   Past Medical History:  Diagnosis Date   A-fib (HCC)    CHF (congestive heart failure) (HCC)    COPD (chronic obstructive pulmonary disease) (HCC)    GERD (gastroesophageal reflux disease)  Hypertension    Hypothyroidism    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   s/p TAVR with a 23 mm Edwards S3UR via TF approach by Dr. Wendel and Dr. Maryjane   Thyroid  disease     Current  Outpatient Medications  Medication Sig Dispense Refill   acetaminophen  (TYLENOL ) 500 MG tablet Take 1 tablet (500 mg total) by mouth every 6 (six) hours as needed for mild pain (pain score 1-3), fever or moderate pain (pain score 4-6) (or Fever >/= 101). 30 tablet 0   ALPRAZolam  (XANAX ) 0.25 MG tablet Take 0.25 mg by mouth at bedtime as needed for sleep.     amiodarone  (PACERONE ) 200 MG tablet Take 1 tablet (200 mg total) by mouth daily.     apixaban  (ELIQUIS ) 5 MG TABS tablet Take 5 mg by mouth 2 (two) times daily.     atorvastatin  (LIPITOR) 40 MG tablet Take 1 tablet (40 mg total) by mouth daily. 30 tablet 0   cholecalciferol  (VITAMIN D3) 25 MCG (1000 UNIT) tablet Take 1,000 Units by mouth daily.     dapagliflozin  propanediol (FARXIGA ) 10 MG TABS tablet Take 1 tablet (10 mg total) by mouth daily before breakfast. 30 tablet 11   doxycycline  (VIBRA -TABS) 100 MG tablet Take 1 tablet (100 mg total) by mouth every 12 (twelve) hours. (Patient not taking: Reported on 12/13/2024) 5 tablet 0   ferrous sulfate  325 (65 FE) MG tablet Take 1 tablet (325 mg total) by mouth daily with breakfast. 30 tablet 0   fluticasone  (FLONASE) 50 MCG/ACT nasal spray Place 2 sprays into both nostrils daily.     furosemide  (LASIX ) 40 MG tablet Take 40 mg by mouth daily.     Ipratropium-Albuterol  (COMBIVENT  RESPIMAT) 20-100 MCG/ACT AERS respimat Inhale 1 puff into the lungs every 6 (six) hours as needed for wheezing.     isosorbide  mononitrate (IMDUR ) 30 MG 24 hr tablet Take 30 mg by mouth daily.     levothyroxine  (SYNTHROID ) 75 MCG tablet Take 75 mcg by mouth daily before breakfast.     lidocaine  (LIDODERM ) 5 % Place 1 patch onto the skin daily.     mometasone -formoterol  (DULERA ) 200-5 MCG/ACT AERO Inhale 2 puffs into the lungs 2 (two) times daily. 1 each 6   montelukast  (SINGULAIR ) 10 MG tablet Take 1 tablet by mouth at bedtime.     pantoprazole  (PROTONIX ) 40 MG tablet Take 40 mg by mouth daily.     potassium chloride  SA  (KLOR-CON  M) 20 MEQ tablet Take 2 tablets (40 mEq total) by mouth daily. 60 tablet    rOPINIRole  (REQUIP ) 0.25 MG tablet Take 1 tablet (0.25 mg total) by mouth at bedtime. 30 tablet 0   spironolactone  (ALDACTONE ) 25 MG tablet Take 1 tablet (25 mg total) by mouth daily.     sucralfate  (CARAFATE ) 1 g tablet Take 1 g by mouth 4 (four) times daily -  with meals and at bedtime.     valsartan  (DIOVAN ) 40 MG tablet Take 0.5 tablets (20 mg total) by mouth daily. 45 tablet 1   vitamin B-12 (CYANOCOBALAMIN ) 1000 MCG tablet Take 1,000 mcg by mouth daily.     No current facility-administered medications for this visit.    Allergies  Allergen Reactions   Naproxen Sodium Anaphylaxis      Social History   Socioeconomic History   Marital status: Widowed    Spouse name: Not on file   Number of children: Not on file   Years of education: Not on file  Highest education level: Not on file  Occupational History   Not on file  Tobacco Use   Smoking status: Former    Current packs/day: 0.50    Average packs/day: 0.5 packs/day for 12.0 years (6.0 ttl pk-yrs)    Types: Cigarettes   Smokeless tobacco: Never   Tobacco comments:    7-8 cigarettes a day  Vaping Use   Vaping status: Never Used  Substance and Sexual Activity   Alcohol use: Not Currently   Drug use: Not Currently   Sexual activity: Not Currently  Other Topics Concern   Not on file  Social History Narrative   Not on file   Social Drivers of Health   Tobacco Use: Medium Risk (12/14/2024)   Received from [facility]   Patient History    Smoking Tobacco Use: Former    Smokeless Tobacco Use: Never    Passive Exposure: Not on file  Financial Resource Strain: Low Risk  (08/25/2024)   Received from [facility]   Overall Financial Resource Strain (CARDIA)    Difficulty of Paying Living Expenses: Not very hard  Food Insecurity: No Food Insecurity (11/12/2024)   Epic    Worried About Running Out of  Food in the Last Year: Never true    Ran Out of Food in the Last Year: Never true  Transportation Needs: No Transportation Needs (11/12/2024)   Epic    Lack of Transportation (Medical): No    Lack of Transportation (Non-Medical): No  Physical Activity: Not on file  Stress: Not on file  Social Connections: Socially Isolated (11/12/2024)   Social Connection and Isolation Panel    Frequency of Communication with Friends and Family: Three times a week    Frequency of Social Gatherings with Friends and Family: More than three times a week    Attends Religious Services: Never    Database Administrator or Organizations: No    Attends Banker Meetings: Never    Marital Status: Divorced  Catering Manager Violence: Not At Risk (11/12/2024)   Epic    Fear of Current or Ex-Partner: No    Emotionally Abused: No    Physically Abused: No    Sexually Abused: No  Depression (PHQ2-9): Not on file  Alcohol Screen: Not on file  Housing: Low Risk (11/12/2024)   Epic    Unable to Pay for Housing in the Last Year: No    Number of Times Moved in the Last Year: 0    Homeless in the Last Year: No  Utilities: Not At Risk (11/12/2024)   Epic    Threatened with loss of utilities: No  Health Literacy: Not on file      Family History  Problem Relation Age of Onset   Breast cancer Neg Hx    Vitals:   12/22/24 1035  BP: (!) 136/57  Pulse: 70  SpO2: 96%  Weight: 160 lb 12.8 oz (72.9 kg)  PF: (!) 0 L/min   Wt Readings from Last 3 Encounters:  12/22/24 160 lb 12.8 oz (72.9 kg)  12/13/24 160 lb 6.4 oz (72.8 kg)  11/15/24 166 lb 0.1 oz (75.3 kg)   Lab Results  Component Value Date   CREATININE 0.98 11/12/2024   CREATININE 1.09 (H) 11/11/2024   CREATININE 1.04 (H) 11/03/2024    PHYSICAL EXAM:  General: Well appearing.  Cor: No JVD. Regular rhythm, rate. II/VI murmur at RUSB Lungs: clear Abdomen: soft, nontender, nondistended. Extremities: trace pitting edema BLE with  pronounced varicosity bilaterally with R>L Neuro:. Affect pleasant   ECG 11/15/24: NSR   ASSESSMENT & PLAN:  1: NICM with preserved ejection fraction- - etiology likely valvular disease as cath showed nonobstructive CAD - NYHA class III - euvolemic - weight down 3 pounds from last visit 2 months ago.  - Echo 12/2021: EF of 65-70% with grade II diastolic dysfunction and moderate AS - Echo 09/2023: EF 65-70%, grade II diastolic dysfunction, mild MR, and severe aortic stenosis. - Echo 01/2024: EF of 60-65%, grade II diastolic dysfunction, mild MR, and mild to moderate AR thought to be paravalvular. - Echo 12/13/24: EF 60-65%, G1DD, normal RV, normally functioning TAVR with a mean gradient of 13 mm hg and mod PVL with a PHT of 368 ms.  - continue farxiga  10mg  daily - continue furosemide  40mg  daily/ potassium 40meq daily - continue spironolactone  25 mg daily - continue valsartan  20mg  daily - wearing compression socks daily with removal at bedtime - BNP 07/27/24 was 73.1  2: HTN- - BP 136/57 - Continue isosorbide , spironolactone , valsartan  - saw PCP Loree) 01/26 - BMET 12/14/24 reviewed: sodium 136, potassium 4.2, creatinine 0.9, & GFR 64  3: PAF- - continue amiodarone  200mg  daily - continue apixaban  5mg  BID - TSH 08/17/24 was 7.742 from 4.185 on 04/23/24  4: Nonobstructive CAD- - LHC in 09/2020 noted mild nonobstructive CAD - LHC 10/2023 noted mild nonobstructive CAD with preserved cardiac output and PAPI. - saw cardiology Dene) 09/25  5: Severe AS- - TAVR 01/25 - followed by valve clinic, last seen 01/26 - Echo 12/13/24: EF 60-65%, G1DD, normal RV, normally functioning TAVR with a mean gradient of 13 mm hg and mod PVL with a PHT of 368 ms.   6: Severe COPD- - saw pulm Alica) 11/25; returns 02/26 - wearing oxygen at 2L around the clock although turned it off in clinic so she could hear me.  - had 2 admissions in December for COPD exacerbation  7: Hyperlipidemia- -  continue atorvastatin  40mg  daily - LDL 05/26/24 was 83   Return in 6 months to see HF MD, sooner if needed.   I spent 30 minutes reviewing records, interviewing/ examing patient and managing plan/ orders.   Ellouise DELENA Class FNP-C 12/22/24 "

## 2024-12-22 NOTE — Patient Instructions (Signed)
It was nice to see you today!
